# Patient Record
Sex: Female | Born: 1935 | Race: White | Hispanic: No | Marital: Married | Smoking: Never smoker
Health system: Southern US, Community
[De-identification: ages and names within clinical notes are randomized; demographics above are authoritative.]

## PROBLEM LIST (undated history)

## (undated) DIAGNOSIS — I10 Essential (primary) hypertension: Secondary | ICD-10-CM

## (undated) DIAGNOSIS — D759 Disease of blood and blood-forming organs, unspecified: Secondary | ICD-10-CM

## (undated) DIAGNOSIS — E079 Disorder of thyroid, unspecified: Secondary | ICD-10-CM

## (undated) DIAGNOSIS — R519 Headache, unspecified: Secondary | ICD-10-CM

## (undated) DIAGNOSIS — N289 Disorder of kidney and ureter, unspecified: Secondary | ICD-10-CM

## (undated) DIAGNOSIS — N83209 Unspecified ovarian cyst, unspecified side: Secondary | ICD-10-CM

## (undated) DIAGNOSIS — R51 Headache: Secondary | ICD-10-CM

## (undated) DIAGNOSIS — D72819 Decreased white blood cell count, unspecified: Secondary | ICD-10-CM

## (undated) DIAGNOSIS — I509 Heart failure, unspecified: Secondary | ICD-10-CM

## (undated) DIAGNOSIS — IMO0001 Reserved for inherently not codable concepts without codable children: Secondary | ICD-10-CM

## (undated) DIAGNOSIS — N3281 Overactive bladder: Secondary | ICD-10-CM

## (undated) DIAGNOSIS — E785 Hyperlipidemia, unspecified: Secondary | ICD-10-CM

## (undated) DIAGNOSIS — R569 Unspecified convulsions: Secondary | ICD-10-CM

## (undated) DIAGNOSIS — K219 Gastro-esophageal reflux disease without esophagitis: Secondary | ICD-10-CM

## (undated) DIAGNOSIS — D649 Anemia, unspecified: Secondary | ICD-10-CM

## (undated) DIAGNOSIS — C801 Malignant (primary) neoplasm, unspecified: Secondary | ICD-10-CM

## (undated) DIAGNOSIS — M1990 Unspecified osteoarthritis, unspecified site: Secondary | ICD-10-CM

## (undated) DIAGNOSIS — I219 Acute myocardial infarction, unspecified: Secondary | ICD-10-CM

## (undated) HISTORY — PX: CORONARY ANGIOPLASTY WITH STENT PLACEMENT: SHX49

## (undated) HISTORY — DX: Unspecified ovarian cyst, unspecified side: N83.209

## (undated) HISTORY — PX: TONSILLECTOMY: SUR1361

## (undated) HISTORY — PX: ABDOMINAL HYSTERECTOMY: SHX81

## (undated) HISTORY — PX: JOINT REPLACEMENT: SHX530

---

## 2004-09-23 ENCOUNTER — Ambulatory Visit: Payer: Self-pay | Admitting: Internal Medicine

## 2005-10-03 ENCOUNTER — Ambulatory Visit: Payer: Self-pay | Admitting: Internal Medicine

## 2006-10-08 ENCOUNTER — Ambulatory Visit: Payer: Self-pay | Admitting: Internal Medicine

## 2007-10-29 ENCOUNTER — Ambulatory Visit: Payer: Self-pay | Admitting: Internal Medicine

## 2008-11-03 ENCOUNTER — Ambulatory Visit: Payer: Self-pay | Admitting: Internal Medicine

## 2009-05-08 HISTORY — PX: EYE SURGERY: SHX253

## 2009-06-09 ENCOUNTER — Ambulatory Visit: Payer: Self-pay | Admitting: Ophthalmology

## 2009-07-28 ENCOUNTER — Ambulatory Visit: Payer: Self-pay | Admitting: Ophthalmology

## 2009-11-04 ENCOUNTER — Ambulatory Visit: Payer: Self-pay | Admitting: Internal Medicine

## 2010-11-08 ENCOUNTER — Ambulatory Visit: Payer: Self-pay | Admitting: Internal Medicine

## 2011-07-17 ENCOUNTER — Encounter: Payer: Self-pay | Admitting: Orthopedic Surgery

## 2011-08-07 ENCOUNTER — Encounter: Payer: Self-pay | Admitting: Orthopedic Surgery

## 2011-11-22 ENCOUNTER — Ambulatory Visit: Payer: Self-pay | Admitting: Internal Medicine

## 2012-11-22 ENCOUNTER — Ambulatory Visit: Payer: Self-pay | Admitting: Internal Medicine

## 2013-10-31 DIAGNOSIS — M169 Osteoarthritis of hip, unspecified: Secondary | ICD-10-CM | POA: Insufficient documentation

## 2014-02-27 ENCOUNTER — Ambulatory Visit: Payer: Self-pay | Admitting: Nurse Practitioner

## 2014-03-16 ENCOUNTER — Ambulatory Visit: Payer: Self-pay | Admitting: Orthopedic Surgery

## 2014-03-16 LAB — URINALYSIS, COMPLETE
Bacteria: NONE SEEN
Bilirubin,UR: NEGATIVE
Blood: NEGATIVE
GLUCOSE, UR: NEGATIVE mg/dL (ref 0–75)
Hyaline Cast: 5
KETONE: NEGATIVE
Nitrite: NEGATIVE
PH: 5 (ref 4.5–8.0)
Protein: NEGATIVE
RBC,UR: 2 /HPF (ref 0–5)
Specific Gravity: 1.018 (ref 1.003–1.030)
Squamous Epithelial: 11
WBC UR: 5 /HPF (ref 0–5)

## 2014-03-16 LAB — CBC
HCT: 41.3 % (ref 35.0–47.0)
HGB: 13.7 g/dL (ref 12.0–16.0)
MCH: 30.7 pg (ref 26.0–34.0)
MCHC: 33.2 g/dL (ref 32.0–36.0)
MCV: 92 fL (ref 80–100)
Platelet: 371 10*3/uL (ref 150–440)
RBC: 4.47 10*6/uL (ref 3.80–5.20)
RDW: 12.2 % (ref 11.5–14.5)
WBC: 4.4 10*3/uL (ref 3.6–11.0)

## 2014-03-16 LAB — BASIC METABOLIC PANEL
Anion Gap: 6 — ABNORMAL LOW (ref 7–16)
BUN: 21 mg/dL — ABNORMAL HIGH (ref 7–18)
CREATININE: 1.17 mg/dL (ref 0.60–1.30)
Calcium, Total: 9.6 mg/dL (ref 8.5–10.1)
Chloride: 96 mmol/L — ABNORMAL LOW (ref 98–107)
Co2: 32 mmol/L (ref 21–32)
GFR CALC AF AMER: 57 — AB
GFR CALC NON AF AMER: 47 — AB
Glucose: 95 mg/dL (ref 65–99)
Osmolality: 271 (ref 275–301)
POTASSIUM: 3.3 mmol/L — AB (ref 3.5–5.1)
Sodium: 134 mmol/L — ABNORMAL LOW (ref 136–145)

## 2014-03-16 LAB — PROTIME-INR
INR: 0.9
Prothrombin Time: 12.2 secs (ref 11.5–14.7)

## 2014-03-16 LAB — SEDIMENTATION RATE: Erythrocyte Sed Rate: 1 mm/hr (ref 0–30)

## 2014-03-16 LAB — APTT: ACTIVATED PTT: 29.2 s (ref 23.6–35.9)

## 2014-03-16 LAB — MRSA PCR SCREENING

## 2014-03-24 ENCOUNTER — Inpatient Hospital Stay: Payer: Self-pay | Admitting: Orthopedic Surgery

## 2014-03-24 LAB — TROPONIN I
Troponin-I: 0.03 ng/mL
Troponin-I: 0.06 ng/mL — ABNORMAL HIGH

## 2014-03-25 LAB — BASIC METABOLIC PANEL
Anion Gap: 6 — ABNORMAL LOW (ref 7–16)
BUN: 20 mg/dL — ABNORMAL HIGH (ref 7–18)
CHLORIDE: 102 mmol/L (ref 98–107)
Calcium, Total: 7.8 mg/dL — ABNORMAL LOW (ref 8.5–10.1)
Co2: 28 mmol/L (ref 21–32)
Creatinine: 1.19 mg/dL (ref 0.60–1.30)
EGFR (Non-African Amer.): 46 — ABNORMAL LOW
GFR CALC AF AMER: 56 — AB
GLUCOSE: 120 mg/dL — AB (ref 65–99)
Osmolality: 276 (ref 275–301)
Potassium: 3.2 mmol/L — ABNORMAL LOW (ref 3.5–5.1)
SODIUM: 136 mmol/L (ref 136–145)

## 2014-03-25 LAB — PLATELET COUNT: Platelet: 233 10*3/uL (ref 150–440)

## 2014-03-25 LAB — HEMOGLOBIN: HGB: 9.2 g/dL — AB (ref 12.0–16.0)

## 2014-05-08 HISTORY — PX: CORONARY STENT PLACEMENT: SHX1402

## 2014-05-26 ENCOUNTER — Ambulatory Visit: Payer: Self-pay | Admitting: Internal Medicine

## 2014-07-13 ENCOUNTER — Ambulatory Visit: Payer: Self-pay | Admitting: Orthopedic Surgery

## 2014-07-23 ENCOUNTER — Inpatient Hospital Stay: Payer: Self-pay | Admitting: Orthopedic Surgery

## 2014-08-29 NOTE — Op Note (Signed)
PATIENT NAME:  Monique Burns, Monique Burns MR#:  [id number] DATE OF BIRTH:  10-10-1930  DATE OF PROCEDURE:  03/24/2014  PREOPERATIVE DIAGNOSIS: Severe osteoarthritis and avascular necrosis, right hip.   POSTOPERATIVE DIAGNOSIS: Severe osteoarthritis and avascular necrosis, right hip.   PROCEDURE: Right total hip replacement, anterior approach.   SURGEON: Hessie Knows, M.D.   ANESTHESIA: Spinal.   DESCRIPTION OF PROCEDURE: The patient was brought to the operating room and after adequate anesthesia was obtained the patient was placed on the operative table with the left leg on a well-padded table, right foot in the Medacta attachment. Traction views were taken at the start of the case to get a determination of appropriate length and then the traction was released. The hip was prepped and draped in the usual sterile fashion with appropriate patient identification and timeout procedures utilized. Amis approach was used with incision centered over the greater trochanter and the TFL. The fascia was incised. The muscle retracted laterally. The lateral femoral circumflex vessels were ligated and the anterior capsule exposed. A capsulotomy was created and deep retractor placed. Femoral neck cut was made at the appropriate level and the head was quite deformed. Checking the acetabulum, there was significant deformity there as well with synovitis present. This was debrided along with the labrum. Reaming was carried out very carefully with the extensive previous bone loss to 52 mm at which point there was bleeding bone superiorly, anterior and posterior, with good anterior and posterior wall still present. Medial the wall was quite thin and no further reaming was carried out medially. A 54 mm Mpact cup was impacted into place and felt very stable so no screws were utilized. The 36 mm internal diameter liner was impacted into place and was locked into the metal cup. Attention was then turned to the femur. With external  rotation, pubofemoral release had already been carried out with the initial capsulotomy. Partial ischiofemoral release was carried out to mobilize the femur. The leg was brought down into extension and a box osteotome used followed by sequential broaching up to a size 4. The size 4 stem was then inserted after trialing with the standard neck. A 4 standard stem was impacted with a S-36 mm head reduced into the acetabulum and near anatomic alignment was felt to have been obtained. The wound was thoroughly irrigated and 30 mL 0.25% Sensorcaine with epinephrine was infiltrated in the periarticular tissues to aid in postop analgesia. The wound was closed with a heavy quill suture for the deep fascia, 2-0 quill subcutaneously, skin staples, Xeroform, 4 x 4's, ABD and tape applied.   IMPLANTS: Medacta Amis 4 stem, 36 mm S head, a 54 mm Mpact cup with E liner, 36 mm internal diameter.   ESTIMATED BLOOD LOSS: 400 mL.   COMPLICATIONS: None.   SPECIMEN REMOVED: Femoral head. ____________________________ Laurene Footman, MD mjm:sb D: 03/24/2014 13:08:46 ET T: 03/24/2014 14:16:38 ET JOB#: [id number]  cc: Laurene Footman, MD, <Dictator> Laurene Footman MD ELECTRONICALLY SIGNED 03/24/2014 19:04

## 2014-08-29 NOTE — Discharge Summary (Signed)
PATIENT NAME:  Monique Burns, Monique Burns MR#:  [id number] DATE OF BIRTH:  June 11, 1930  DATE OF ADMISSION:  03/24/2014 DATE OF DISCHARGE:  03/28/2014  ADMITTING DIAGNOSIS: Severe osteoarthritis of the right hip.   DISCHARGE DIAGNOSIS: Severe osteoarthritis of the right hip.   OPERATION: On 03/24/2014, the patient had a right total hip replacement using anterior approach.   SURGEON: Laurene Footman, MD  IMPLANTS USED: Medacta AMIS 4 stem, 36 mm S head, a 54 mm MPACT cup with E liner, 36 mm internal diameter.   ESTIMATED BLOOD LOSS: 400 mL.   COMPLICATIONS: None.  The patient was stabilized, brought to the recovery room and then brought down to the orthopedic floor.   HISTORY AND PHYSICAL: The patient is an 81 year old female who presented for evaluation of her right hip. The patient has been refractory to conservative treatment and has been having difficulties with activities around her home. The patient is having pain involving her hip joint.   PHYSICAL EXAMINATION: GENERAL: Well-nourished female with antalgic gait and a slight hip lurch to the right.  LUNGS: Clear to auscultation.  HEART: Regular rate and rhythm.  MUSCULOSKELETAL: In regard to the right hip, the patient does have mild shortening with limited internal and external rotation as well as abduction and flexion. The patient has pain along the anterior hip joint. The patient has no real crepitus with motion. The patient has good quadriceps control.   HOSPITAL COURSE: After initial admission on 03/24/2014, the patient was brought to the orthopedic floor. The patient on postoperative day 1 had a hemoglobin of 9.2, which remained stable there. The patient worked with physical therapy, initially bed-to-chair and progressed up to ambulating 220 feet around the nurse's station, doing well. The patient was ready to go home with home health physical therapy.   CONDITION AT DISCHARGE: Stable.   DISPOSITION: The patient was sent home with home  health physical therapy.   DISCHARGE INSTRUCTIONS: The patient will do weight bear as tolerated on the affected leg and use a walker for ambulation. The patient will use anterior hip precautions. The patient will elevate her heels off the bed and use the incentive spirometer. The patient was encouraged to do cough and deep breathing. The patient has a regular diet. The patient will have a dressing change as needed with physical therapy. The patient will try to keep her dressing clean and dry and try not to take it off. The patient will call the clinic if there is any bright red bleeding, calf pain, bowel or bladder difficulty or any fever greater than 101.5. The patient will do physical therapy at home working on range of motion and gait training and strengthening.   DISCHARGE MEDICATIONS: Resume home medications and to add Tylenol 500 mg 1 tablet every 4 hours as needed for fever, oxycodone 5 mg 1 tablet q.4 hours as needed for moderate to severe pain, milk of magnesia 30 mL twice a day for constipation, Lovenox 40 mg 1 injection a day for 14 days and discontinue and then start an aspirin 81 mg once a day.   ____________________________ Lenna Sciara. Reche Dixon, [place] jtm:sw D: 03/28/2014 06:49:00 ET T: 03/28/2014 07:03:43 ET JOB#: [id number]  cc: J. Reche Dixon, [place], <Dictator> J Teckla Christiansen [facility] PA ELECTRONICALLY SIGNED 03/29/2014 6:17

## 2014-08-29 NOTE — Consult Note (Signed)
   Present Illness Called by PACU to see patient who is s/p orthopedic surgery who was felt to have st depression on telemetry strips during surgery. POst op,she states she had some chest heaviness and ekg revealed nonspecific st t wave changes. She deneis any prior cardiac issues and is on hctz for blood pressure control. She is currently relatively hypotensive after receiving sl ntg.   Physical Exam:  GEN well nourished, no acute distress   HEENT PERRL   NECK supple   RESP normal resp effort  clear BS   CARD Regular rate and rhythm  Normal, S1, S2  No murmur   ABD denies tenderness  no hernia  normal BS   LYMPH negative neck, negative axillae   EXTR negative cyanosis/clubbing, negative edema   SKIN normal to palpation   NEURO cranial nerves intact, motor/sensory function intact   PSYCH A+O to time, place, person   Review of Systems:  Subjective/Chief Complaint complains of mild chest heaviness.   Skin: No Complaints   ENT: No Complaints   Eyes: No Complaints   Neck: No Complaints   Respiratory: No Complaints   Cardiovascular: Tightness   Gastrointestinal: No Complaints   Genitourinary: No Complaints   Vascular: No Complaints   Musculoskeletal: No Complaints   Neurologic: No Complaints   Hematologic: No Complaints   Endocrine: No Complaints   Psychiatric: No Complaints   Review of Systems: All other systems were reviewed and found to be negative   Medications/Allergies Reviewed Medications/Allergies reviewed   Family & Social History:  Family and Social History:  Family History Coronary Artery Disease   EKG:  Interpretation nsr with nsttw changes    No Known Allergies:    Impression Pt with history of hypertension who had right hip replacment today and was noted to have st depression on telemetry strips during surgery. Post op surgery ekg revealed nsr witih ns st t wave changes. Pt states she has some chest heaviness. No prior cardiac  problems. Will follow on off unint telemetry and repeat ekg in am. Will rule out for mi with serial troponin and treat with asa. Hold beta blockers for now due to relative hypotension post ntg.   Plan 1. Rule out for mi 2. Ecotrin 81 mg daily 3. Off unit telemetry 4. Serial troponin 5. Post op care per orthopaedics 6. Further recs pending course.   Electronic Signatures: Teodoro Spray (MD)  (Signed 17-Nov-15 14:19)  Authored: General Aspect/Present Illness, History and Physical Exam, Review of System, Family & Social History, EKG , Allergies, Impression/Plan   Last Updated: 17-Nov-15 14:19 by Teodoro Spray (MD)

## 2014-08-31 LAB — SURGICAL PATHOLOGY

## 2014-09-06 NOTE — Op Note (Signed)
PATIENT NAME:  Monique Burns, Monique Burns MR#:  [id number] DATE OF BIRTH:  1930/05/24  DATE OF PROCEDURE:  07/23/2014  PREOPERATIVE DIAGNOSIS: Left hip, severe osteoarthritis.   POSTOPERATIVE DIAGNOSIS: Left hip, severe osteoarthritis.   PROCEDURE: Left total hip replacement.   ANESTHESIA: Spinal.   SURGEON: Laurene Footman, MD   DESCRIPTION OF PROCEDURE: The patient was brought to the operating room and after adequate anesthesia was obtained, the left hip was prepped and draped in the usual sterile fashion after right leg was placed on a well-padded table, left foot in the Medacta attachment, and preoperative x-ray template was obtained. After patient identification and timeout procedures were completed, an incision was made centered over the greater trochanter and tensor fascia lata muscle. The tensor fascia was exposed and the muscle belly retracted laterally. The deep fascia was incised, and the lateral femoral circumflex vessels ligated. The anterior capsule was then exposed and a capsulotomy created, followed by femoral neck cut with eburnated bone on the femur and acetabulum. Reaming was carried out to 52 mm, at which point, there was good bleeding bone. A 52 mm Versafitcup DM was stable and impacted into position. Next, the leg was externally rotated with pubofemoral and ischiofemoral releases carried out. The leg was dropped into extension. Broaching was carried out to a size 4 stem with an S head trial and standard neck length. Restoration of anatomy was obtained and these final components were assembled with a size 4 stem impacted down the canal. The S-28 mm head and Versafitcup liner DM assembled on the back table, impacted out of the cup. The hip was reduced and was stable. The wound was thoroughly irrigated with Betadine lavage. The deep fascia was repaired using a heavy quill suture, 2-0 quill subcutaneously, followed by skin staples. Xeroform, 4 x 4's, ABD, and tape applied.   ESTIMATED BLOOD  LOSS: 100 mL.   COMPLICATIONS: None.   SPECIMEN: Removed femoral head.   IMPLANTS: Versafitcup DM 52 mm with liner S-28 mm head and Amis 4 standard stem.     ____________________________ Laurene Footman, MD mjm:mw D: 07/23/2014 20:21:15 ET T: 07/23/2014 20:32:05 ET JOB#: [id number]  cc: Laurene Footman, MD, <Dictator> Laurene Footman MD ELECTRONICALLY SIGNED 07/24/2014 8:54

## 2014-09-06 NOTE — Discharge Summary (Signed)
PATIENT NAME:  Monique Burns, Monique Burns MR#:  [id number] DATE OF BIRTH:  [birth year]  DATE OF ADMISSION:  07/23/2014 DATE OF DISCHARGE:  07/26/2014  ADMITTING DIAGNOSIS: Left hip severe osteoarthritis.   DISCHARGE DIAGNOSIS: Left hip severe osteoarthritis.  OPERATION: On 07/23/2014, the patient had a left total hip replacement.   ANESTHESIA: Spinal.   SURGEON: Laurene Footman, MD..   ESTIMATED BLOOD LOSS: 100 mL.  IMPLANTS USED: Versafit DM 52-mm with liner S, 28 mm head, and AMIS 4 standard stem. The patient was stabilized, brought to the recovery room, and then brought down to the orthopedic floor for pain control and physical therapy.   HISTORY AND PHYSICAL:  HISTORY: The patient is an 81 year old female who presented for upcoming hip replacement. The patient has had continued pain and been refractory to conservative treatment. She is status post right total hip replacement in November 2015 and did very well with this. The patient has continued to have difficulty getting around and pain and has failed conservative management.   PHYSICAL EXAMINATION:  GENERAL: Alert female with difficulty ambulating secondary to the left hip. The patient has trouble with transfers.  HEART: Regular rate and rhythm.  LUNGS: Clear to auscultation.  MUSCULOSKELETAL: In regard to the left hip, the patient has no real tenderness to palpation but does have very limited and restricted range of motion secondary to pain.  NEUROLOGIC: Essentially intact.   HOSPITAL COURSE: After initial admission on 07/23/2014, the patient had a hemoglobin on postoperative day 1 of 10.8, which remained stable there to 10 on postoperative day 2. The patient received no transfusion. The patient worked with physical therapy, initially bed to chair and progressing up to ambulating 150 feet and around the nurse's station with a slow gait but no significant discomfort. The patient was ready go home with home health physical therapy on  07/26/2014.   CONDITION AT DISCHARGE: Stable.   DISPOSITION: The patient was sent home with home health physical therapy.   DISCHARGE INSTRUCTIONS: The patient will do weight bear as tolerated on the affected leg. The patient will elevate her leg with 1 to 2 pillows, use knee-high TED hose on both legs, removed at bedtime. She will elevate her heels off the bed and use incentive spirometer while awake as well as be encouraged to do cough and deep breathing. The patient will do a regular diet. The patient will try to keep her dressing clean, try not to get it wet, and have it changed as needed with physical therapy. The patient will call the clinic if there is bright red bleeding, calf pain, bowel or bladder difficulty, or any fever greater than 101.5. The patient will do home health physical therapy, working on gait training and strengthening.   DISCHARGE MEDICATIONS: Resume home medication and to add Tylenol 500 mg 1 tablet every 4 hours as needed for pain or fever, oxycodone 5 mg 1 tablet every 4 hours as needed for severe pain, Lovenox 40 mg subcutaneous once a day for 14 days and then discontinue and begin 81 mg aspirin once a day and Senokot-S 1 tablet twice a day for constipation.    ____________________________ Lenna Sciara. Reche Dixon, [place] jtm:jh D: 07/26/2014 06:58:28 ET T: 07/26/2014 09:00:07 ET JOB#: [id number]  cc: J. Reche Dixon, [place], <Dictator> J Tiney Zipper [facility] PA ELECTRONICALLY SIGNED 07/27/2014 8:41

## 2015-02-03 ENCOUNTER — Other Ambulatory Visit: Payer: Self-pay | Admitting: Internal Medicine

## 2015-02-03 DIAGNOSIS — Z1231 Encounter for screening mammogram for malignant neoplasm of breast: Secondary | ICD-10-CM

## 2015-02-06 DIAGNOSIS — I219 Acute myocardial infarction, unspecified: Secondary | ICD-10-CM

## 2015-02-06 HISTORY — DX: Acute myocardial infarction, unspecified: I21.9

## 2015-02-28 ENCOUNTER — Emergency Department
Admission: EM | Admit: 2015-02-28 | Discharge: 2015-02-28 | Payer: Medicare Other | Attending: Emergency Medicine | Admitting: Emergency Medicine

## 2015-02-28 ENCOUNTER — Emergency Department: Payer: Medicare Other

## 2015-02-28 ENCOUNTER — Encounter: Payer: Self-pay | Admitting: Adult Health

## 2015-02-28 DIAGNOSIS — I213 ST elevation (STEMI) myocardial infarction of unspecified site: Secondary | ICD-10-CM

## 2015-02-28 DIAGNOSIS — I249 Acute ischemic heart disease, unspecified: Secondary | ICD-10-CM | POA: Diagnosis not present

## 2015-02-28 DIAGNOSIS — I10 Essential (primary) hypertension: Secondary | ICD-10-CM | POA: Diagnosis not present

## 2015-02-28 DIAGNOSIS — I2109 ST elevation (STEMI) myocardial infarction involving other coronary artery of anterior wall: Secondary | ICD-10-CM | POA: Insufficient documentation

## 2015-02-28 DIAGNOSIS — R079 Chest pain, unspecified: Secondary | ICD-10-CM | POA: Diagnosis present

## 2015-02-28 DIAGNOSIS — R7989 Other specified abnormal findings of blood chemistry: Secondary | ICD-10-CM | POA: Insufficient documentation

## 2015-02-28 DIAGNOSIS — R778 Other specified abnormalities of plasma proteins: Secondary | ICD-10-CM

## 2015-02-28 HISTORY — DX: Unspecified osteoarthritis, unspecified site: M19.90

## 2015-02-28 HISTORY — DX: Decreased white blood cell count, unspecified: D72.819

## 2015-02-28 HISTORY — DX: Disorder of thyroid, unspecified: E07.9

## 2015-02-28 HISTORY — DX: Hyperlipidemia, unspecified: E78.5

## 2015-02-28 HISTORY — DX: Disorder of kidney and ureter, unspecified: N28.9

## 2015-02-28 HISTORY — DX: Essential (primary) hypertension: I10

## 2015-02-28 LAB — BASIC METABOLIC PANEL
ANION GAP: 10 (ref 5–15)
BUN: 25 mg/dL — AB (ref 6–20)
CO2: 26 mmol/L (ref 22–32)
Calcium: 9.9 mg/dL (ref 8.9–10.3)
Chloride: 101 mmol/L (ref 101–111)
Creatinine, Ser: 1.11 mg/dL — ABNORMAL HIGH (ref 0.44–1.00)
GFR, EST AFRICAN AMERICAN: 52 mL/min — AB (ref >60)
GFR, EST NON AFRICAN AMERICAN: 45 mL/min — AB (ref >60)
Glucose, Bld: 130 mg/dL — ABNORMAL HIGH (ref 65–99)
POTASSIUM: 3.8 mmol/L (ref 3.5–5.1)
SODIUM: 137 mmol/L (ref 135–145)

## 2015-02-28 LAB — CBC
HEMATOCRIT: 40 % (ref 35.0–47.0)
Hemoglobin: 13.2 g/dL (ref 12.0–16.0)
MCH: 29.7 pg (ref 26.0–34.0)
MCHC: 33 g/dL (ref 32.0–36.0)
MCV: 89.9 fL (ref 80.0–100.0)
Platelets: 282 10*3/uL (ref 150–440)
RBC: 4.45 MIL/uL (ref 3.80–5.20)
RDW: 13.9 % (ref 11.5–14.5)
WBC: 6.4 10*3/uL (ref 3.6–11.0)

## 2015-02-28 LAB — TROPONIN I: Troponin I: 2.89 ng/mL — ABNORMAL HIGH (ref <0.031)

## 2015-02-28 LAB — PROTIME-INR
INR: 0.97
PROTHROMBIN TIME: 13.1 s (ref 11.4–15.0)

## 2015-02-28 MED ORDER — ONDANSETRON HCL 4 MG/2ML IJ SOLN
4.0000 mg | Freq: Once | INTRAMUSCULAR | Status: AC
  Administered 2015-02-28: 4 mg via INTRAVENOUS

## 2015-02-28 MED ORDER — MORPHINE SULFATE (PF) 2 MG/ML IV SOLN
INTRAVENOUS | Status: AC
  Administered 2015-02-28: 2 mg via INTRAVENOUS
  Filled 2015-02-28: qty 1

## 2015-02-28 MED ORDER — HEPARIN (PORCINE) IN NACL 100-0.45 UNIT/ML-% IJ SOLN
700.0000 [IU]/h | INTRAMUSCULAR | Status: DC
  Filled 2015-02-28: qty 250

## 2015-02-28 MED ORDER — ONDANSETRON HCL 4 MG/2ML IJ SOLN
INTRAMUSCULAR | Status: AC
  Administered 2015-02-28: 4 mg via INTRAVENOUS
  Filled 2015-02-28: qty 2

## 2015-02-28 MED ORDER — MORPHINE SULFATE (PF) 2 MG/ML IV SOLN
2.0000 mg | Freq: Once | INTRAVENOUS | Status: AC
  Administered 2015-02-28: 2 mg via INTRAVENOUS

## 2015-02-28 MED ORDER — TICAGRELOR 90 MG PO TABS
180.0000 mg | ORAL_TABLET | Freq: Once | ORAL | Status: AC
  Administered 2015-02-28: 180 mg via ORAL
  Filled 2015-02-28: qty 2

## 2015-02-28 MED ORDER — HEPARIN SODIUM (PORCINE) 5000 UNIT/ML IJ SOLN
INTRAMUSCULAR | Status: AC
  Filled 2015-02-28: qty 1

## 2015-02-28 MED ORDER — NITROGLYCERIN 2 % TD OINT
1.0000 [in_us] | TOPICAL_OINTMENT | Freq: Once | TRANSDERMAL | Status: AC
  Administered 2015-02-28: 1 [in_us] via TOPICAL
  Filled 2015-02-28: qty 1

## 2015-02-28 MED ORDER — HEPARIN BOLUS VIA INFUSION
3400.0000 [IU] | Freq: Once | INTRAVENOUS | Status: DC
  Filled 2015-02-28: qty 3400

## 2015-02-28 MED ORDER — ASPIRIN EC 325 MG PO TBEC
325.0000 mg | DELAYED_RELEASE_TABLET | Freq: Once | ORAL | Status: AC
  Administered 2015-02-28: 325 mg via ORAL

## 2015-02-28 MED ORDER — HEPARIN SODIUM (PORCINE) 5000 UNIT/ML IJ SOLN: 60.0000 [IU]/kg | Freq: Once | INTRAMUSCULAR | Status: DC

## 2015-02-28 MED ORDER — ASPIRIN 81 MG PO CHEW: 324.0000 mg | CHEWABLE_TABLET | Freq: Once | ORAL | Status: DC

## 2015-02-28 MED ORDER — ASPIRIN EC 325 MG PO TBEC
DELAYED_RELEASE_TABLET | ORAL | Status: AC
  Administered 2015-02-28: 325 mg via ORAL
  Filled 2015-02-28: qty 1

## 2015-02-28 MED ORDER — HEPARIN SODIUM (PORCINE) 5000 UNIT/ML IJ SOLN
60.0000 [IU]/kg | Freq: Once | INTRAMUSCULAR | Status: AC
  Administered 2015-02-28: 3400 [IU] via INTRAVENOUS

## 2015-02-28 NOTE — ED Notes (Signed)
Report given to [facility] [place].

## 2015-02-28 NOTE — ED Notes (Signed)
MD notified of elevated troponin of 2.89

## 2015-02-28 NOTE — Progress Notes (Signed)
ANTICOAGULATION CONSULT NOTE - Initial Consult  Pharmacy Consult for heparin drip Indication: ACS/STEMI  No Known Allergies  Patient Measurements: Weight: 125 lb (56.7 kg) Heparin Dosing Weight: 57kg  Vital Signs: Temp: 97.8 F (36.6 C) (10/23 0540) Temp Source: Oral (10/23 0353) BP: 166/89 mmHg (10/23 0540) Pulse Rate: 81 (10/23 0540)  Labs:  Recent Labs  02/28/15 0409 02/28/15 0410  HGB 13.2  --   HCT 40.0  --   PLT 282  --   LABPROT  --  13.1  INR  --  0.97  CREATININE 1.11*  --   TROPONINI 2.89*  --     CrCl cannot be calculated (Unknown ideal weight.).   Medical History: Past Medical History  Diagnosis Date  . Hypertension   . Renal disorder   . Arthritis   . Thyroid disease   . Hyperlipidemia   . Leukopenia     Medications:    Assessment: Hgb 13.2  plt 282 IR 0.97  Goal of Therapy:  Heparin level 0.3-0.7 units/ml Monitor platelets by anticoagulation protocol: Yes   Plan:  3400 unit bolus and initial rate of 700 units/hr ordered. Pt to be transferred out. No f/u labs ordered.  Annalea Alguire S 02/28/2015,6:49 AM

## 2015-02-28 NOTE — ED Notes (Signed)
Presents with left sided crushing chest pain that radiaites into her left arm and is associated with indigestion, nausea, SOB and general not feeling well. Endorses emeisis yesterday morning. Pain is constant all day today but began in September and was not as severe. Pain is made worse with activity and nothing makes painbetter.

## 2015-02-28 NOTE — ED Provider Notes (Signed)
[facility] Emergency Department Provider Note  ____________________________________________  Time seen: Approximately 4:17 AM  I have reviewed the triage vital signs and the nursing notes.   HISTORY  Chief Complaint Chest Pain    HPI Monique Burns is a 81 y.o. female who presents to the ED from home with the chief complaint of chest pain. Patient has had a six-month history of angina; had normal Myoview and echocardiogram in March by Dr. Clayborn Bigness.Most recently saw him in the office October 3 for continued anginal symptoms; currently on medication therapy. States this morning she is having more pain that radiates into her left arm. Symptoms are associated with nausea, shortness of breath and indigestion. States she vomited 2 prior to arrival. More recently patient states her angina has been more frequent since the beginning of September. Pain was constant all day, just not as intense. Pain became more severe approximately midnight. Pain is exacerbated with activity; nothing makes her pain better. Denies recent fever, chills, abdominal pain, headache, neck pain. Denies recent travel or trauma.   Past Medical History  Diagnosis Date  . Hypertension   . Renal disorder   . Arthritis   . Thyroid disease   . Hyperlipidemia   . Leukopenia     There are no active problems to display for this patient.   History reviewed. No pertinent past surgical history.  No current outpatient prescriptions on file.  Allergies Review of patient's allergies indicates no known allergies.  History reviewed. No pertinent family history.  Social History Social History  Substance Use Topics  . Smoking status: Never Smoker   . Smokeless tobacco: None  . Alcohol Use: No    Review of Systems Constitutional: No fever/chills Eyes: No visual changes. ENT: No sore throat. Cardiovascular: Positive for chest pain. Respiratory: Denies shortness of breath. Gastrointestinal:  No abdominal pain.  No nausea, no vomiting.  No diarrhea.  No constipation. Genitourinary: Negative for dysuria. Musculoskeletal: Negative for back pain. Skin: Negative for rash. Neurological: Negative for headaches, focal weakness or numbness.  10-point ROS otherwise negative.  ____________________________________________   PHYSICAL EXAM:  VITAL SIGNS: ED Triage Vitals  Enc Vitals Group     BP 02/28/15 0353 156/64 mmHg     Pulse Rate 02/28/15 0353 61     Resp 02/28/15 0353 18     Temp 02/28/15 0353 98 F (36.7 C)     Temp Source 02/28/15 0353 Oral     SpO2 02/28/15 0353 98 %     Weight --      Height --      Head Cir --      Peak Flow --      Pain Score 02/28/15 0353 9     Pain Loc --      Pain Edu? --      Excl. in [place]? --     Constitutional: Alert and oriented. Well appearing and in no acute distress. Eyes: Conjunctivae are normal. PERRL. EOMI. Head: Atraumatic. Nose: No congestion/rhinnorhea. Mouth/Throat: Mucous membranes are moist.  Oropharynx non-erythematous. Neck: No stridor. No carotid bruits. Cardiovascular: Normal rate, regular rhythm. Grossly normal heart sounds.  Good peripheral circulation. Respiratory: Normal respiratory effort.  No retractions. Lungs CTAB. Gastrointestinal: Soft and nontender. No distention. No abdominal bruits. No CVA tenderness. Musculoskeletal: No lower extremity tenderness nor edema.  No joint effusions. Neurologic:  Normal speech and language. No gross focal neurologic deficits are appreciated. No gait instability. Skin:  Skin is warm, dry and intact. No  rash noted. Psychiatric: Mood and affect are normal. Speech and behavior are normal.  ____________________________________________   LABS (all labs ordered are listed, but only abnormal results are displayed)  Labs Reviewed  BASIC METABOLIC PANEL - Abnormal; Notable for the following:    Glucose, Bld 130 (*)    BUN 25 (*)    Creatinine, Ser 1.11 (*)    GFR calc non Af Amer  45 (*)    GFR calc Af Amer 52 (*)    All other components within normal limits  TROPONIN I - Abnormal; Notable for the following:    Troponin I 2.89 (*)    All other components within normal limits  CBC   ____________________________________________  EKG  ED ECG REPORT I, Marny Smethers J, the attending physician, personally viewed and interpreted this ECG.   Date: 02/28/2015  EKG Time: 0347  Rate: 58  Rhythm: sinus bradycardia  Axis: Normal  Intervals:right bundle branch block  ST&T Change: 2-48mm ST elevation in leads V2 and V3 without reciprocal changes.  ____________________________________________  RADIOLOGY  Portable chest x-ray (viewed by me, interpreted per Dr. Radene Knee): No acute cardiopulmonary process seen. ____________________________________________   PROCEDURES  Procedure(s) performed: None  Critical Care performed: Yes, see critical care note(s)   CRITICAL CARE Performed by: Paulette Blanch   Total critical care time: 30 minutes  Critical care time was exclusive of separately billable procedures and treating other patients.  Critical care was necessary to treat or prevent imminent or life-threatening deterioration.  Critical care was time spent personally by me on the following activities: development of treatment plan with patient and/or surrogate as well as nursing, discussions with consultants, evaluation of patient's response to treatment, examination of patient, obtaining history from patient or surrogate, ordering and performing treatments and interventions, ordering and review of laboratory studies, ordering and review of radiographic studies, pulse oximetry and re-evaluation of patient's condition.  ____________________________________________   INITIAL IMPRESSION / ASSESSMENT AND PLAN / ED COURSE  Pertinent labs & imaging results that were available during my care of the patient were reviewed by me and considered in my medical decision making (see  chart for details).  81 year old female who presents with worsening anginal pain this morning. EKG is concerning for ST elevation in anterior leads. Will administer aspirin; consult cardiology urgently.  ----------------------------------------- 4:20 AM on 02/28/2015 -----------------------------------------  Call placed to Dr. Nehemiah Massed (cardiology on-call).  ----------------------------------------- 4:30 AM on 02/28/2015 -----------------------------------------  Call placed to [facility] fellow; if patient needs urgent intervention this weekend our facility does not have cardiac cath capabilities over the weekend. In the midst of speaking with CCU fellow when he urgently had to step away for an emergent patient. Anticipate callback. Currently patient is a mode dynamically stable. Will administer low-dose morphine as opposed to nitroglycerin as I do not want to precipitously drop her blood pressure.   ----------------------------------------- 4:55 AM on 02/28/2015 -----------------------------------------  Patient accepted to [facility] in transfer. They recommend administering 180mg  Brilinta and heparin bolus prior to transfer. Updated patient and spouse who are both agreeable to plan of care.  ----------------------------------------- 5:40 AM on 02/28/2015 -----------------------------------------  Ntg paste applied prior to patient's transport. She left the ED hemodynamically stable.   ____________________________________________   FINAL CLINICAL IMPRESSION(S) / ED DIAGNOSES  Final diagnoses:  Acute coronary syndrome (HCC)  Elevated troponin  ST elevation myocardial infarction (STEMI), unspecified artery (HCC)      Paulette Blanch, MD 02/28/15 [phone]

## 2015-02-28 NOTE — ED Notes (Signed)
Pt. States chest pain that radiates into lt. Arm began yesterday morning around 8 am.  Pt. States pain has increased throughout day.

## 2015-03-22 ENCOUNTER — Ambulatory Visit: Payer: Medicare Other

## 2015-04-05 ENCOUNTER — Encounter: Payer: Medicare Other | Attending: Internal Medicine | Admitting: *Deleted

## 2015-04-05 DIAGNOSIS — E079 Disorder of thyroid, unspecified: Secondary | ICD-10-CM | POA: Insufficient documentation

## 2015-04-05 DIAGNOSIS — I2102 ST elevation (STEMI) myocardial infarction involving left anterior descending coronary artery: Secondary | ICD-10-CM

## 2015-04-05 DIAGNOSIS — I10 Essential (primary) hypertension: Secondary | ICD-10-CM | POA: Insufficient documentation

## 2015-04-05 DIAGNOSIS — E785 Hyperlipidemia, unspecified: Secondary | ICD-10-CM | POA: Insufficient documentation

## 2015-04-05 DIAGNOSIS — D72819 Decreased white blood cell count, unspecified: Secondary | ICD-10-CM | POA: Insufficient documentation

## 2015-04-05 DIAGNOSIS — N189 Chronic kidney disease, unspecified: Secondary | ICD-10-CM | POA: Insufficient documentation

## 2015-04-05 DIAGNOSIS — M1990 Unspecified osteoarthritis, unspecified site: Secondary | ICD-10-CM | POA: Insufficient documentation

## 2015-04-05 DIAGNOSIS — Z9861 Coronary angioplasty status: Secondary | ICD-10-CM

## 2015-04-05 NOTE — Progress Notes (Signed)
Incomplete Session Note  Patient Details  Name: Monique Burns MRN: [id number] Date of Birth: [birth year] Referring Provider:  Yolonda Kida, MD  [name] did not complete her rehab session.  She had an injection into her left knee done right before this visit. She was not able to to the 6 min walk today. Has been rescheduled for 12/16 at 1 PM.   [place]

## 2015-04-23 ENCOUNTER — Encounter: Payer: Medicare Other | Attending: Internal Medicine | Admitting: *Deleted

## 2015-04-23 VITALS — Ht 68.0 in | Wt 120.9 lb

## 2015-04-23 DIAGNOSIS — I2102 ST elevation (STEMI) myocardial infarction involving left anterior descending coronary artery: Secondary | ICD-10-CM

## 2015-04-23 DIAGNOSIS — Z9861 Coronary angioplasty status: Secondary | ICD-10-CM | POA: Insufficient documentation

## 2015-04-23 NOTE — Patient Instructions (Signed)
Patient Instructions  Patient Details  Name: Monique Burns MRN: [id number] Date of Birth: 02-Nov-1930 Referring Provider:  Adin Hector, MD  Below are the personal goals you chose as well as exercise and nutrition goals. Our goal is to help you keep on track towards obtaining and maintaining your goals. We will be discussing your progress on these goals with you throughout the program.  Initial Exercise Prescription:     Initial Exercise Prescription - 04/23/15 1300    Date of Initial Exercise Prescription   Date 04/23/15   Treadmill   MPH 1.2   Grade 0   Minutes 10   Bike   Level 0.2   Watts 15   Minutes 10   Recumbant Bike   Level 2   RPM 40   Watts 15   Minutes 15   NuStep   Level 2   Watts 25   Minutes 15   Arm Ergometer   Level 1   Watts 8   Minutes 10   Arm/Foot Ergometer   Level 4   Watts 12   Minutes 10   Cybex   Level 1   RPM 50   Minutes 10   Recumbant Elliptical   Level 1   RPM 40   Watts 10   Minutes 10   REL-XR   Level 2   Watts 30   Minutes 15   T5 Nustep   Level 1   Watts 12   Minutes 15   Biostep-RELP   Level 2   Watts 15   Minutes 15   Prescription Details   Frequency (times per week) 3   Duration Progress to 30 minutes of continuous aerobic without signs/symptoms of physical distress   Intensity   THRR REST +  30   Ratings of Perceived Exertion 11-15   Progression Continue progressive overload as per policy without signs/symptoms or physical distress.   Resistance Training   Training Prescription Yes   Weight 2   Reps 10-15      Exercise Goals: Frequency: Be able to perform aerobic exercise three times per week working toward 3-5 days per week.  Intensity: Work with a perceived exertion of 11 (fairly light) - 15 (hard) as tolerated. Follow your new exercise prescription and watch for changes in prescription as you progress with the program. Changes will be reviewed with you when they are made.  Duration: You should  be able to do 30 minutes of continuous aerobic exercise in addition to a 5 minute warm-up and a 5 minute cool-down routine.  Nutrition Goals: Your personal nutrition goals will be established when you do your nutrition analysis with the dietician.  The following are nutrition guidelines to follow: Cholesterol < 200mg /day Sodium < 1500mg /day Fiber: Women over 50 yrs - 21 grams per day  Personal Goals:     Personal Goals and Risk Factors at Admission - 04/05/15 1333    Personal Goals and Risk Factors on Admission   Increase Aerobic Exercise and Physical Activity Sedentary  Recently had both hip replacements.  Has pain in the left knee. Today received injection in the knee. Wants to return to her ability to clean her house  the  way she was able to prior to her hip surgeries and the heart attack..   Intervention Provide exercise education and an individualized exercise prescription that will provide continued progressive overload as per policy without signs/symptoms of physical distress.   Hypertension Yes   Goal Participant will  see blood pressure controlled within the values of 140/63mm/Hg or within value directed by their physician.   Intervention Provide nutrition & aerobic exercise along with prescribed medications to achieve BP 140/90 or less.   Lipids Yes   Goal Cholesterol controlled with medications as prescribed, with individualized exercise RX and with personalized nutrition plan. Value goals: LDL < 70mg , HDL > 40mg . Participant states understanding of desired cholesterol values and following prescriptions.   Intervention Provide nutrition & aerobic exercise along with prescribed medications to achieve LDL 70mg , HDL >40mg .   Stress Yes  Stress is present every day.  [name] states she can "deal with stress"  well.  Her husband has been diagnosed with dementia.    Goal To meet with psychosocial counselor for stress and relaxation information and guidance. To state understanding of  performing relaxation techniques and or identifying personal stressors.   Intervention Provide education on types of stress, identifiying stressors, and ways to cope with stress. Provide demonstration and active practice of relaxation techniques.      Tobacco Use Initial Evaluation: History  Smoking status  . Never Smoker   Smokeless tobacco  . Not on file    Copy of goals given to participant.

## 2015-04-23 NOTE — Progress Notes (Signed)
Cardiac Individual Treatment Plan  Patient Details  Name: Monique Burns MRN: [id number] Date of Birth: 1931/03/08 Referring Provider:  Adin Hector, MD  Initial Encounter Date: Date: 04/23/15  Visit Diagnosis: ST elevation (STEMI) myocardial infarction involving left anterior descending coronary artery ([place])  S/P PTCA (percutaneous transluminal coronary angioplasty)  Patient's Home Medications on Admission:  Current outpatient prescriptions:  .  aspirin 81 MG chewable tablet, Chew by mouth., Disp: , Rfl:  .  atorvastatin (LIPITOR) 80 MG tablet, Take by mouth., Disp: , Rfl:  .  B Complex-Biotin-FA (BALANCED B-100 PO), Take 1 tablet by mouth daily., Disp: , Rfl:  .  calcium-vitamin D (OSCAL WITH D) 500-200 MG-UNIT tablet, Take 1 tablet by mouth daily., Disp: , Rfl:  .  Cholecalciferol (VITAMIN D3) 1000 UNITS CAPS, Take 1 tablet by mouth daily., Disp: , Rfl:  .  cycloSPORINE (RESTASIS) 0.05 % ophthalmic emulsion, Apply 1 drop to eye., Disp: , Rfl:  .  DULoxetine (CYMBALTA) 30 MG capsule, Take 30 mg by mouth daily., Disp: , Rfl: 2 .  isosorbide mononitrate (IMDUR) 60 MG 24 hr tablet, Take 0.5 tablets by mouth. Taking half a tablet per md instructions, Disp: , Rfl:  .  metoprolol succinate (TOPROL-XL) 25 MG 24 hr tablet, Take 0.5 tablets by mouth., Disp: , Rfl:  .  nitroGLYCERIN (NITROSTAT) 0.4 MG SL tablet, Place under the tongue., Disp: , Rfl:  .  oxyCODONE-acetaminophen (PERCOCET/ROXICET) 5-325 MG tablet, Take 1 tablet by mouth every 6 (six) hours as needed., Disp: , Rfl: 0 .  polyethylene glycol powder (GLYCOLAX/MIRALAX) powder, Take by mouth., Disp: , Rfl:  .  ranitidine (ZANTAC) 150 MG capsule, Take 1 capsule by mouth 2 (two) times daily., Disp: , Rfl:   Past Medical History: Past Medical History  Diagnosis Date  . Hypertension   . Renal disorder   . Arthritis   . Thyroid disease   . Hyperlipidemia   . Leukopenia     Tobacco Use: History  Smoking status  . Never  Smoker   Smokeless tobacco  . Not on file    Labs: Recent Review Flowsheet Data    There is no flowsheet data to display.       Exercise Target Goals: Date: 04/23/15  Exercise Program Goal: Individual exercise prescription set with THRR, safety & activity barriers. Participant demonstrates ability to understand and report RPE using BORG scale, to self-measure pulse accurately, and to acknowledge the importance of the exercise prescription.  Exercise Prescription Goal: Starting with aerobic activity 30 plus minutes a day, 3 days per week for initial exercise prescription. Provide home exercise prescription and guidelines that participant acknowledges understanding prior to discharge.  Activity Barriers & Risk Stratification:     Activity Barriers & Risk Stratification - 04/05/15 1323    Activity Barriers & Risk Stratification   Activity Barriers Arthritis;Right Hip Replacement;Left Hip Replacement;Deconditioning;Joint Problems   Risk Stratification High      6 Minute Walk:     6 Minute Walk      04/23/15 1303       6 Minute Walk   Phase Initial     Distance 815 feet     Walk Time 6 minutes     Resting HR 77 bpm     Resting BP 126/64 mmHg     Max Ex. HR 102 bpm     Max Ex. BP 140/58 mmHg     RPE 11     Symptoms Yes (comment)  Comments slight dizziness and unsteadiness         Initial Exercise Prescription:     Initial Exercise Prescription - 04/23/15 1300    Date of Initial Exercise Prescription   Date 04/23/15   Treadmill   MPH 1.2   Grade 0   Minutes 10   Bike   Level 0.2   Watts 15   Minutes 10   Recumbant Bike   Level 2   RPM 40   Watts 15   Minutes 15   NuStep   Level 2   Watts 25   Minutes 15   Arm Ergometer   Level 1   Watts 8   Minutes 10   Arm/Foot Ergometer   Level 4   Watts 12   Minutes 10   Cybex   Level 1   RPM 50   Minutes 10   Recumbant Elliptical   Level 1   RPM 40   Watts 10   Minutes 10   REL-XR   Level 2    Watts 30   Minutes 15   T5 Nustep   Level 1   Watts 12   Minutes 15   Biostep-RELP   Level 2   Watts 15   Minutes 15   Prescription Details   Frequency (times per week) 3   Duration Progress to 30 minutes of continuous aerobic without signs/symptoms of physical distress   Intensity   THRR REST +  30   Ratings of Perceived Exertion 11-15   Progression Continue progressive overload as per policy without signs/symptoms or physical distress.   Resistance Training   Training Prescription Yes   Weight 2   Reps 10-15      Exercise Prescription Changes:   Discharge Exercise Prescription (Final Exercise Prescription Changes):   Nutrition:  Target Goals: Understanding of nutrition guidelines, daily intake of sodium 1500mg , cholesterol 200mg , calories 30% from fat and 7% or less from saturated fats, daily to have 5 or more servings of fruits and vegetables.  Biometrics:     Pre Biometrics - 04/23/15 1301    Pre Biometrics   Height 5\' 8"  (1.727 m)   Weight 120 lb 14.4 oz (54.84 kg)   Waist Circumference 27 inches   Hip Circumference 35.25 inches   Waist to Hip Ratio 0.77 %   BMI (Calculated) 18.4       Nutrition Therapy Plan and Nutrition Goals:   Nutrition Discharge: Rate Your Plate Scores:   Nutrition Goals Re-Evaluation:   Psychosocial: Target Goals: Acknowledge presence or absence of depression, maximize coping skills, provide positive support system. Participant is able to verbalize types and ability to use techniques and skills needed for reducing stress and depression.  Initial Review & Psychosocial Screening:     Initial Psych Review & Screening - 04/05/15 1341    Initial Review   Current issues with Current Stress Concerns   Source of Stress Concerns Chronic Illness;Unable to perform yard/household activities   Comments Shawni's husband has been diagnosed with dementia. She deals with this daily.    Family Dynamics   Good Support System? Yes    Comments Alexa has good family support.  Children , nieces and nephews   Barriers   Psychosocial barriers to participate in program There are no identifiable barriers or psychosocial needs.;The patient should benefit from training in stress management and relaxation.   Screening Interventions   Interventions Encouraged to exercise      Quality of Life Scores:  PHQ-9:     Recent Review Flowsheet Data    Depression screen [facility] 2/9 04/05/2015   Decreased Interest 1   Down, Depressed, Hopeless 0   PHQ - 2 Score 1   Altered sleeping 1   Tired, decreased energy 2   Change in appetite 2   Feeling bad or failure about yourself  0   Trouble concentrating 0   Moving slowly or fidgety/restless 0   Suicidal thoughts 0   PHQ-9 Score 6   Difficult doing work/chores Not difficult at all      Psychosocial Evaluation and Intervention:   Psychosocial Re-Evaluation:   Vocational Rehabilitation: Provide vocational rehab assistance to qualifying candidates.   Vocational Rehab Evaluation & Intervention:     Vocational Rehab - 04/05/15 1324    Initial Vocational Rehab Evaluation & Intervention   Assessment shows need for Vocational Rehabilitation No      Education: Education Goals: Education classes will be provided on a weekly basis, covering required topics. Participant will state understanding/return demonstration of topics presented.  Learning Barriers/Preferences:     Learning Barriers/Preferences - 04/05/15 1323    Learning Barriers/Preferences   Learning Barriers None   Learning Preferences None      Education Topics: General Nutrition Guidelines/Fats and Fiber: -Group instruction provided by verbal, written material, models and posters to present the general guidelines for heart healthy nutrition. Gives an explanation and review of dietary fats and fiber.   Controlling Sodium/Reading Food Labels: -Group verbal and written material supporting the discussion of  sodium use in heart healthy nutrition. Review and explanation with models, verbal and written materials for utilization of the food label.   Exercise Physiology & Risk Factors: - Group verbal and written instruction with models to review the exercise physiology of the cardiovascular system and associated critical values. Details cardiovascular disease risk factors and the goals associated with each risk factor.   Aerobic Exercise & Resistance Training: - Gives group verbal and written discussion on the health impact of inactivity. On the components of aerobic and resistive training programs and the benefits of this training and how to safely progress through these programs.   Flexibility, Balance, General Exercise Guidelines: - Provides group verbal and written instruction on the benefits of flexibility and balance training programs. Provides general exercise guidelines with specific guidelines to those with heart or lung disease. Demonstration and skill practice provided.   Stress Management: - Provides group verbal and written instruction about the health risks of elevated stress, cause of high stress, and healthy ways to reduce stress.   Depression: - Provides group verbal and written instruction on the correlation between heart/lung disease and depressed mood, treatment options, and the stigmas associated with seeking treatment.   Anatomy & Physiology of the Heart: - Group verbal and written instruction and models provide basic cardiac anatomy and physiology, with the coronary electrical and arterial systems. Review of: AMI, Angina, Valve disease, Heart Failure, Cardiac Arrhythmia, Pacemakers, and the ICD.   Cardiac Procedures: - Group verbal and written instruction and models to describe the testing methods done to diagnose heart disease. Reviews the outcomes of the test results. Describes the treatment choices: Medical Management, Angioplasty, or Coronary Bypass  Surgery.   Cardiac Medications: - Group verbal and written instruction to review commonly prescribed medications for heart disease. Reviews the medication, class of the drug, and side effects. Includes the steps to properly store meds and maintain the prescription regimen.   Go Sex-Intimacy & Heart Disease, Get SMART -  Goal Setting: - Group verbal and written instruction through game format to discuss heart disease and the return to sexual intimacy. Provides group verbal and written material to discuss and apply goal setting through the application of the S.M.A.R.T. Method.   Other Matters of the Heart: - Provides group verbal, written materials and models to describe Heart Failure, Angina, Valve Disease, and Diabetes in the realm of heart disease. Includes description of the disease process and treatment options available to the cardiac patient.   Exercise & Equipment Safety: - Individual verbal instruction and demonstration of equipment use and safety with use of the equipment.          Cardiac Rehab from 04/05/2015 in [facility] Cardiac Rehab   Date  04/05/15   Educator  SB   Instruction Review Code  2- meets goals/outcomes      Infection Prevention: - Provides verbal and written material to individual with discussion of infection control including proper hand washing and proper equipment cleaning during exercise session.      Cardiac Rehab from 04/05/2015 in [facility] Cardiac Rehab   Date  04/05/15   Educator  SB   Instruction Review Code  2- meets goals/outcomes      Falls Prevention: - Provides verbal and written material to individual with discussion of falls prevention and safety.      Cardiac Rehab from 04/05/2015 in [facility] Cardiac Rehab   Date  04/05/15   Educator  SB   Instruction Review Code  2- meets goals/outcomes      Diabetes: - Individual verbal and written instruction to review signs/symptoms of diabetes, desired ranges of glucose level fasting, after meals and with  exercise. Advice that pre and post exercise glucose checks will be done for 3 sessions at entry of program.    Knowledge Questionnaire Score:     Knowledge Questionnaire Score - 04/05/15 1324    Knowledge Questionnaire Score   Pre Score 21/27      Personal Goals and Risk Factors at Admission:     Personal Goals and Risk Factors at Admission - 04/05/15 1333    Personal Goals and Risk Factors on Admission   Increase Aerobic Exercise and Physical Activity Sedentary  Recently had both hip replacements.  Has pain in the left knee. Today received injection in the knee. Wants to return to her ability to clean her house  the  way she was able to prior to her hip surgeries and the heart attack..   Intervention Provide exercise education and an individualized exercise prescription that will provide continued progressive overload as per policy without signs/symptoms of physical distress.   Hypertension Yes   Goal Participant will see blood pressure controlled within the values of 140/48mm/Hg or within value directed by their physician.   Intervention Provide nutrition & aerobic exercise along with prescribed medications to achieve BP 140/90 or less.   Lipids Yes   Goal Cholesterol controlled with medications as prescribed, with individualized exercise RX and with personalized nutrition plan. Value goals: LDL < 70mg , HDL > 40mg . Participant states understanding of desired cholesterol values and following prescriptions.   Intervention Provide nutrition & aerobic exercise along with prescribed medications to achieve LDL 70mg , HDL >40mg .   Stress Yes  Stress is present every day.  Maisyn states she can "deal with stress"  well.  Her husband has been diagnosed with dementia.    Goal To meet with psychosocial counselor for stress and relaxation information and guidance. To state understanding of performing relaxation  techniques and or identifying personal stressors.   Intervention Provide education on  types of stress, identifiying stressors, and ways to cope with stress. Provide demonstration and active practice of relaxation techniques.      Personal Goals and Risk Factors Review:    Personal Goals Discharge (Final Personal Goals and Risk Factors Review):    ITP Comments:     ITP Comments      04/05/15 1326 04/05/15 1328 04/23/15 1535       ITP Comments Initial ITP created today for review and sign off by Dr Loleta Chance. Unable to walk today Had injection in the left knee.   Mckinnley completed her functional test evaluation today.  She plans to start the sessions next week.        Comments: initial ITP

## 2015-04-27 DIAGNOSIS — Z9861 Coronary angioplasty status: Secondary | ICD-10-CM

## 2015-04-27 DIAGNOSIS — I2102 ST elevation (STEMI) myocardial infarction involving left anterior descending coronary artery: Secondary | ICD-10-CM

## 2015-04-27 NOTE — Progress Notes (Signed)
Daily Session Note  Patient Details  Name: Monique Burns MRN: [id number] Date of Birth: May 19, 1930 Referring Provider:  Yolonda Kida, MD  Encounter Date: 04/27/2015  Check In:     Session Check In - 04/27/15 0901    Check-In   Staff Present Candiss Norse, MS, ACSM CEP, Exercise Physiologist;Diane Joya Gaskins, RN, Apolonio Schneiders, BS, Exercise Physiologist   ER physicians immediately available to respond to emergencies See telemetry face sheet for immediately available ER MD   Medication changes reported     No   Fall or balance concerns reported    No   Warm-up and Cool-down Performed on first and last piece of equipment   VAD Patient? No   Pain Assessment   Currently in Pain? No/denies           Exercise Prescription Changes - 04/27/15 0900    Exercise Review   Progression No   Response to Exercise   Comments First day of exercises! Patient was oriented to the equipment and exercise prescription was discussed. Patient was able to complete exercise today without signs or symptoms.       Goals Met:  Exercise tolerated well No report of cardiac concerns or symptoms Strength training completed today  Goals Unmet:  Not Applicable  Goals Comments:    Dr. Emily Filbert is Medical Director for [place] and [facility].

## 2015-04-29 ENCOUNTER — Encounter: Payer: Medicare Other | Admitting: *Deleted

## 2015-04-29 DIAGNOSIS — Z9861 Coronary angioplasty status: Secondary | ICD-10-CM

## 2015-04-29 DIAGNOSIS — I2102 ST elevation (STEMI) myocardial infarction involving left anterior descending coronary artery: Secondary | ICD-10-CM

## 2015-04-29 NOTE — Progress Notes (Signed)
Daily Session Note  Patient Details  Name: Monique Burns MRN: [id number] Date of Birth: Jul 17, 1930 Referring Provider:  Yolonda Kida, MD  Encounter Date: 04/29/2015  Check In:     Session Check In - 04/29/15 0945    Check-In   Staff Present Gerlene Burdock, RN, Moises Blood, BS, ACSM CEP, Exercise Physiologist;Kendall Otis Peak, Exercise Physiologist   ER physicians immediately available to respond to emergencies See telemetry face sheet for immediately available ER MD   Medication changes reported     No   Fall or balance concerns reported    No   Warm-up and Cool-down Performed on first and last piece of equipment   VAD Patient? No   Pain Assessment   Currently in Pain? No/denies   Multiple Pain Sites No         Goals Met:  Independence with exercise equipment Exercise tolerated well No report of cardiac concerns or symptoms Strength training completed today  Goals Unmet:  Not Applicable  Goals Comments: Patient completed exercise prescription and all exercise goals during rehab session. The exercise was tolerated well and the patient is progressing in the program.     Dr. Emily Filbert is Medical Director for [place] and [facility].

## 2015-05-04 DIAGNOSIS — I2102 ST elevation (STEMI) myocardial infarction involving left anterior descending coronary artery: Secondary | ICD-10-CM

## 2015-05-04 DIAGNOSIS — Z9861 Coronary angioplasty status: Secondary | ICD-10-CM

## 2015-05-04 NOTE — Progress Notes (Signed)
Daily Session Note  Patient Details  Name: Monique Burns MRN: [id number] Date of Birth: [birth year] Referring Provider:  Yolonda Kida, MD  Encounter Date: 05/04/2015  Check In:     Session Check In - 05/04/15 [place]    Check-In   Staff Present Candiss Norse, MS, ACSM CEP, Exercise Physiologist;Diane Joya Gaskins, RN, Apolonio Schneiders, BS, Exercise Physiologist   ER physicians immediately available to respond to emergencies See telemetry face sheet for immediately available ER MD   Medication changes reported     No   Fall or balance concerns reported    No   Warm-up and Cool-down Performed on first and last piece of equipment   VAD Patient? No   Pain Assessment   Currently in Pain? No/denies         Goals Met:  Independence with exercise equipment Exercise tolerated well No report of cardiac concerns or symptoms Strength training completed today  Goals Unmet:  Not Applicable  Goals Comments:    Dr. Emily Filbert is Medical Director for [place] and [facility].

## 2015-05-05 ENCOUNTER — Emergency Department: Payer: Medicare Other

## 2015-05-05 ENCOUNTER — Emergency Department
Admission: EM | Admit: 2015-05-05 | Discharge: 2015-05-06 | Disposition: A | Discharge status: Other Acute Inpatient Hospital | Payer: Medicare Other | Attending: Emergency Medicine | Admitting: Emergency Medicine

## 2015-05-05 ENCOUNTER — Encounter: Payer: Self-pay | Admitting: *Deleted

## 2015-05-05 ENCOUNTER — Other Ambulatory Visit: Payer: Self-pay

## 2015-05-05 DIAGNOSIS — Z79899 Other long term (current) drug therapy: Secondary | ICD-10-CM | POA: Diagnosis not present

## 2015-05-05 DIAGNOSIS — I129 Hypertensive chronic kidney disease with stage 1 through stage 4 chronic kidney disease, or unspecified chronic kidney disease: Secondary | ICD-10-CM | POA: Insufficient documentation

## 2015-05-05 DIAGNOSIS — R079 Chest pain, unspecified: Secondary | ICD-10-CM | POA: Diagnosis not present

## 2015-05-05 DIAGNOSIS — Z7982 Long term (current) use of aspirin: Secondary | ICD-10-CM | POA: Insufficient documentation

## 2015-05-05 DIAGNOSIS — N189 Chronic kidney disease, unspecified: Secondary | ICD-10-CM | POA: Insufficient documentation

## 2015-05-05 DIAGNOSIS — I252 Old myocardial infarction: Secondary | ICD-10-CM | POA: Diagnosis not present

## 2015-05-05 HISTORY — DX: Acute myocardial infarction, unspecified: I21.9

## 2015-05-05 LAB — PROTIME-INR
INR: 1.03
Prothrombin Time: 13.7 seconds (ref 11.4–15.0)

## 2015-05-05 LAB — BASIC METABOLIC PANEL
Anion gap: 9 (ref 5–15)
BUN: 19 mg/dL (ref 6–20)
CALCIUM: 8.9 mg/dL (ref 8.9–10.3)
CO2: 22 mmol/L (ref 22–32)
CREATININE: 1 mg/dL (ref 0.44–1.00)
Chloride: 103 mmol/L (ref 101–111)
GFR, EST AFRICAN AMERICAN: 59 mL/min — AB (ref >60)
GFR, EST NON AFRICAN AMERICAN: 51 mL/min — AB (ref >60)
Glucose, Bld: 120 mg/dL — ABNORMAL HIGH (ref 65–99)
Potassium: 3.8 mmol/L (ref 3.5–5.1)
SODIUM: 134 mmol/L — AB (ref 135–145)

## 2015-05-05 LAB — CBC
HEMATOCRIT: 32.4 % — AB (ref 35.0–47.0)
Hemoglobin: 11 g/dL — ABNORMAL LOW (ref 12.0–16.0)
MCH: 29.7 pg (ref 26.0–34.0)
MCHC: 34 g/dL (ref 32.0–36.0)
MCV: 87.4 fL (ref 80.0–100.0)
Platelets: 300 10*3/uL (ref 150–440)
RBC: 3.71 MIL/uL — AB (ref 3.80–5.20)
RDW: 13.8 % (ref 11.5–14.5)
WBC: 8.8 10*3/uL (ref 3.6–11.0)

## 2015-05-05 LAB — TROPONIN I: Troponin I: <0.03 ng/mL (ref <0.031)

## 2015-05-05 LAB — APTT: aPTT: 31 seconds (ref 24–36)

## 2015-05-05 MED ORDER — NITROGLYCERIN 2 % TD OINT
1.0000 [in_us] | TOPICAL_OINTMENT | Freq: Once | TRANSDERMAL | Status: AC
  Administered 2015-05-05: 1 [in_us] via TOPICAL
  Filled 2015-05-05: qty 1

## 2015-05-05 MED ORDER — ASPIRIN 81 MG PO CHEW
324.0000 mg | CHEWABLE_TABLET | Freq: Once | ORAL | Status: AC
  Administered 2015-05-05: 324 mg via ORAL
  Filled 2015-05-05: qty 4

## 2015-05-05 NOTE — ED Provider Notes (Addendum)
[facility] Emergency Department Provider Note  ____________________________________________   I have reviewed the triage vital signs and the nursing notes.   HISTORY  Chief Complaint Chest Pain    HPI Monique Burns is a 81 y.o. 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 She is here because of a cough. She states she has been coughing since Sunday and since yesterday she has had an uncomfortable feeling in her chest. The feeling is nothing like her prior MI. Her MI and radiation to her chest wall. Her complaint here is cough and discomfort with coughing and a discomfort just generally speaking in her chest which is different from what she had in October. The patient states this is nothing like her angina and again it has been uninterrupted since yesterday morning. He is concerned she has pneumonia.  Past Medical History  Diagnosis Date  . Hypertension   . Renal disorder   . Arthritis   . Thyroid disease   . Hyperlipidemia   . Leukopenia   . MI (myocardial infarction) [facility])     Patient Active Problem List   Diagnosis Date Noted  . Chronic kidney disease 04/05/2015  . HLD (hyperlipidemia) 04/05/2015  . BP (high blood pressure) 04/05/2015  . Decreased leukocytes 04/05/2015  . Arthritis, degenerative 04/05/2015  . Disease of thyroid gland 04/05/2015  . Acute myocardial infarction of anterior wall ([place]) 02/28/2015  . Degenerative arthritis of hip 10/31/2013    Past Surgical History  Procedure Laterality Date  . Coronary angioplasty with stent placement      Current Outpatient Rx  Name  Route  Sig  Dispense  Refill  . aspirin 81 MG chewable tablet   Oral   Chew by mouth.         Marland Kitchen atorvastatin (LIPITOR) 80 MG tablet   Oral   Take by mouth.         . B Complex-Biotin-FA (BALANCED B-100 PO)   Oral   Take 1 tablet by  mouth daily.         . calcium-vitamin D (OSCAL WITH D) 500-200 MG-UNIT tablet   Oral   Take 1 tablet by mouth daily.         . Cholecalciferol (VITAMIN D3) 1000 UNITS CAPS   Oral   Take 1 tablet by mouth daily.         . cycloSPORINE (RESTASIS) 0.05 % ophthalmic emulsion   Ophthalmic   Apply 1 drop to eye.         . DULoxetine (CYMBALTA) 30 MG capsule   Oral   Take 30 mg by mouth daily.      2   . isosorbide mononitrate (IMDUR) 60 MG 24 hr tablet   Oral   Take 0.5 tablets by mouth. Taking half a tablet per md instructions         . metoprolol succinate (TOPROL-XL) 25 MG 24 hr tablet   Oral   Take 0.5 tablets by mouth.         . nitroGLYCERIN (NITROSTAT) 0.4 MG SL tablet   Sublingual   Place under the tongue.         Marland Kitchen oxyCODONE-acetaminophen (PERCOCET/ROXICET) 5-325 MG tablet   Oral   Take 1 tablet by mouth every 6 (six) hours as needed.      0   . polyethylene glycol powder (GLYCOLAX/MIRALAX) powder   Oral  Take by mouth.         . ranitidine (ZANTAC) 150 MG capsule   Oral   Take 1 capsule by mouth 2 (two) times daily.           Allergies Review of patient's allergies indicates no known allergies.  No family history on file.  Social History Social History  Substance Use Topics  . Smoking status: Never Smoker   . Smokeless tobacco: None  . Alcohol Use: No    Review of Systems Constitutional: No fever/chills Eyes: No visual changes. ENT: No sore throat. No stiff neck no neck pain Cardiovascular: See history of present illness Respiratory: The history of present illness Gastrointestinal:   no vomiting.  No diarrhea.  No constipation. Genitourinary: Negative for dysuria. Musculoskeletal: Negative lower extremity swelling Skin: Negative for rash. Neurological: Negative for headaches, focal weakness or numbness. 10-point ROS otherwise negative.  ____________________________________________   PHYSICAL EXAM:  VITAL SIGNS: ED  Triage Vitals  Enc Vitals Group     BP --      Pulse --      Resp --      Temp --      Temp src --      SpO2 --      Weight --      Height --      Head Cir --      Peak Flow --      Pain Score --      Pain Loc --      Pain Edu? --      Excl. in [place]? --     Constitutional: Alert and oriented. Well appearing and in no acute distress. Eyes: Conjunctivae are normal. PERRL. EOMI. Head: Atraumatic. Nose: No congestion/rhinnorhea. Mouth/Throat: Mucous membranes are moist.  Oropharynx non-erythematous. Neck: No stridor.   Nontender with no meningismus Cardiovascular: Normal rate, regular rhythm. Grossly normal heart sounds.  Good peripheral circulation. Respiratory: Normal respiratory effort.  No retractions. Lungs CTAB. Abdominal: Soft and nontender. No distention. No guarding no rebound Back:  There is no focal tenderness or step off there is no midline tenderness there are no lesions noted. there is no CVA tenderness Musculoskeletal: No lower extremity tenderness. No joint effusions, no DVT signs strong distal pulses no edema Neurologic:  Normal speech and language. No gross focal neurologic deficits are appreciated.  Skin:  Skin is warm, dry and intact. No rash noted. Psychiatric: Mood and affect are normal. Speech and behavior are normal.  ____________________________________________   LABS (all labs ordered are listed, but only abnormal results are displayed)  Labs Reviewed  BASIC METABOLIC PANEL  CBC  TROPONIN I   ____________________________________________  EKG  I personally interpreted any EKGs ordered by me or triage Sinus rhythm rate 81 bpm there is ST elevation in V1 and V2 and ST depression in V5 V6, however the ST elevation is not comparable to what she had before in the same distribution. ST depression seems to be new but again we do not have an EKG from after her myocardial infarction.  ----------------------------------------- 11:19 PM on  05/05/2015 -----------------------------------------  EKG: Sinus rhythm at 79 bpm, ST elevation anteriorly is much reduced in V1 and the morphology is better in V2. ST depression in V5 and V6 is greatly improved as well. ____________________________________________  RADIOLOGY  I reviewed any imaging ordered by me or triage that were performed during my shift ____________________________________________   PROCEDURES  Procedure(s) performed: None  Critical Care performed: None  ____________________________________________  INITIAL IMPRESSION / ASSESSMENT AND PLAN / ED COURSE  Pertinent labs & imaging results that were available during my care of the patient were reviewed by me and considered in my medical decision making (see chart for details).  Concerning EKG however story is not consistent with patient's angina and she is in an interrupted discomfort of a noncardiac variety in her opinion since yesterday morning in the context of a cough. She describes it as a slight pressure she feels that she has a pneumonia. I have called the on-call deep fellow and we will fax this EKG to them to see if they wish to act more precipitously given her history I do not feel that she at this time needs to go to the based on this EKG especially since we do not have any post MI EKGs for this patient.  ----------------------------------------- 10:12 PM on 05/05/2015 -----------------------------------------  We are awaiting callback from cardiology. They're apparently in a STEMI at [facility]. They're the ones who most likely have her post-MI EKGs.   ----------------------------------------- 10:40 PM on 05/05/2015 -----------------------------------------  D/w Gerhard Perches, MD of [place] [facility].  Initial troponin is negative. They will call me back they agree pt does not need to go as stemi.  ----------------------------------------- 11:06 PM on  05/05/2015 -----------------------------------------  Patient is pain-free at this time with no chest discomfort or pressure after nitroglycerin which is reassuring. ____________________________________________   FINAL CLINICAL IMPRESSION(S) / ED DIAGNOSES  Final diagnoses:  None     Schuyler Amor, MD 05/05/15 [place], MD 05/05/15 [id number]  Schuyler Amor, MD 05/05/15 [place], MD 05/05/15 [id number]  Schuyler Amor, MD 05/05/15 2320

## 2015-05-05 NOTE — ED Notes (Signed)
Patient states she has a hx. Of MI 3 weeks ago with one stent placed. Patient c/o chest pressure, dizziness, nausea and shortness of breath today. Patient states she has had a cough since Sunday.

## 2015-05-05 NOTE — ED Notes (Signed)
Xray tech performing portable xray.

## 2015-05-06 ENCOUNTER — Telehealth: Payer: Self-pay

## 2015-05-06 NOTE — ED Notes (Signed)
Pt presents to Ed with c/o chest pressure since Sunday morning. Reports she has also been coughing since Sunday morning. Pt also c/o dizziness, nausea, and shortness of breath today. Pt has hx on MI, had a stent placed 3 weeks ago. Pt reports symptoms do not feel similar to previous MI. Pt alert and oriented x 4, no increased work in breathing noted, skin warm and dry. Pt speaking in complete sentences. Dr. Burlene Arnt at bedside.

## 2015-05-06 NOTE — Telephone Encounter (Signed)
Patient called and stated she would not be in class due to chest discomfort and she is at [facility] for a cath.

## 2015-05-09 NOTE — Progress Notes (Signed)
Cardiac Individual Treatment Plan  Patient Details  Name: Monique Burns MRN: [id number] Date of Birth: 08-24-1930 Referring Provider:  Yolonda Kida, MD  Initial Encounter Date:    Visit Diagnosis: ST elevation (STEMI) myocardial infarction involving left anterior descending coronary artery (HCC)  S/P PTCA (percutaneous transluminal coronary angioplasty)  Patient's Home Medications on Admission:  Current outpatient prescriptions:  .  aspirin 81 MG chewable tablet, Chew by mouth., Disp: , Rfl:  .  atorvastatin (LIPITOR) 80 MG tablet, Take 80 mg by mouth daily at 6 PM. , Disp: , Rfl:  .  B Complex-Biotin-FA (BALANCED B-100 PO), Take 1 tablet by mouth daily., Disp: , Rfl:  .  calcium-vitamin D (OSCAL WITH D) 500-200 MG-UNIT tablet, Take 1 tablet by mouth daily., Disp: , Rfl:  .  Cholecalciferol (VITAMIN D3) 1000 UNITS CAPS, Take 1 tablet by mouth daily., Disp: , Rfl:  .  cycloSPORINE (RESTASIS) 0.05 % ophthalmic emulsion, Place 1 drop into both eyes 2 (two) times daily. , Disp: , Rfl:  .  DULoxetine (CYMBALTA) 30 MG capsule, Take 30 mg by mouth daily., Disp: , Rfl: 2 .  isosorbide mononitrate (IMDUR) 60 MG 24 hr tablet, Take 60 mg by mouth. Taking half a tablet per md instructions, Disp: , Rfl:  .  metoprolol succinate (TOPROL-XL) 25 MG 24 hr tablet, Take 0.5 tablets by mouth daily. , Disp: , Rfl:  .  nitroGLYCERIN (NITROSTAT) 0.4 MG SL tablet, Place under the tongue., Disp: , Rfl:  .  polyethylene glycol powder (GLYCOLAX/MIRALAX) powder, Take by mouth., Disp: , Rfl:  .  ranitidine (ZANTAC) 150 MG capsule, Take 1 capsule by mouth 2 (two) times daily., Disp: , Rfl:   Past Medical History: Past Medical History  Diagnosis Date  . Hypertension   . Renal disorder   . Arthritis   . Thyroid disease   . Hyperlipidemia   . Leukopenia   . MI (myocardial infarction) ([place])     Tobacco Use: History  Smoking status  . Never Smoker   Smokeless tobacco  . Not on file     Labs: Recent Review Flowsheet Data    There is no flowsheet data to display.       Exercise Target Goals:    Exercise Program Goal: Individual exercise prescription set with THRR, safety & activity barriers. Participant demonstrates ability to understand and report RPE using BORG scale, to self-measure pulse accurately, and to acknowledge the importance of the exercise prescription.  Exercise Prescription Goal: Starting with aerobic activity 30 plus minutes a day, 3 days per week for initial exercise prescription. Provide home exercise prescription and guidelines that participant acknowledges understanding prior to discharge.  Activity Barriers & Risk Stratification:     Activity Barriers & Risk Stratification - 04/05/15 1323    Activity Barriers & Risk Stratification   Activity Barriers Arthritis;Right Hip Replacement;Left Hip Replacement;Deconditioning;Joint Problems   Risk Stratification High      6 Minute Walk:     6 Minute Walk      04/23/15 1303       6 Minute Walk   Phase Initial     Distance 815 feet     Walk Time 6 minutes     Resting HR 77 bpm     Resting BP 126/64 mmHg     Max Ex. HR 102 bpm     Max Ex. BP 140/58 mmHg     RPE 11     Symptoms Yes (comment)  Comments slight dizziness and unsteadiness         Initial Exercise Prescription:     Initial Exercise Prescription - 04/23/15 1300    Date of Initial Exercise Prescription   Date 04/23/15   Treadmill   MPH 1.2   Grade 0   Minutes 10   Bike   Level 0.2   Watts 15   Minutes 10   Recumbant Bike   Level 2   RPM 40   Watts 15   Minutes 15   NuStep   Level 2   Watts 25   Minutes 15   Arm Ergometer   Level 1   Watts 8   Minutes 10   Arm/Foot Ergometer   Level 4   Watts 12   Minutes 10   Cybex   Level 1   RPM 50   Minutes 10   Recumbant Elliptical   Level 1   RPM 40   Watts 10   Minutes 10   REL-XR   Level 2   Watts 30   Minutes 15   T5 Nustep   Level 1    Watts 12   Minutes 15   Biostep-RELP   Level 2   Watts 15   Minutes 15   Prescription Details   Frequency (times per week) 3   Duration Progress to 30 minutes of continuous aerobic without signs/symptoms of physical distress   Intensity   THRR REST +  30   Ratings of Perceived Exertion 11-15   Progression Continue progressive overload as per policy without signs/symptoms or physical distress.   Resistance Training   Training Prescription Yes   Weight 2   Reps 10-15      Exercise Prescription Changes:     Exercise Prescription Changes      04/27/15 0900 05/04/15 1503         Exercise Review   Progression No       Response to Exercise   Blood Pressure (Admit)  118/62 mmHg      Blood Pressure (Exercise)  124/62 mmHg      Blood Pressure (Exit)  136/70 mmHg      Heart Rate (Admit)  76 bpm      Heart Rate (Exercise)  99 bpm      Heart Rate (Exit)  75 bpm      Rating of Perceived Exertion (Exercise)  13      Symptoms  none      Comments First day of exercises! Patient was oriented to the equipment and exercise prescription was discussed. Patient was able to complete exercise today without signs or symptoms.        Duration  Progress to 30 minutes of continuous aerobic without signs/symptoms of physical distress      Intensity  THRR unchanged      Progression  Continue progressive overload as per policy without signs/symptoms or physical distress.      Resistance Training   Training Prescription  Yes      Weight  2      Reps  10-15      Interval Training   Interval Training  No      Treadmill   MPH  1      Grade  0      Minutes  15      NuStep   Level  4      Watts  20      Minutes  15  Discharge Exercise Prescription (Final Exercise Prescription Changes):     Exercise Prescription Changes - 05/04/15 1503    Response to Exercise   Blood Pressure (Admit) 118/62 mmHg   Blood Pressure (Exercise) 124/62 mmHg   Blood Pressure (Exit) 136/70 mmHg   Heart  Rate (Admit) 76 bpm   Heart Rate (Exercise) 99 bpm   Heart Rate (Exit) 75 bpm   Rating of Perceived Exertion (Exercise) 13   Symptoms none   Duration Progress to 30 minutes of continuous aerobic without signs/symptoms of physical distress   Intensity THRR unchanged   Progression Continue progressive overload as per policy without signs/symptoms or physical distress.   Resistance Training   Training Prescription Yes   Weight 2   Reps 10-15   Interval Training   Interval Training No   Treadmill   MPH 1   Grade 0   Minutes 15   NuStep   Level 4   Watts 20   Minutes 15      Nutrition:  Target Goals: Understanding of nutrition guidelines, daily intake of sodium <1580m, cholesterol <2061m calories 30% from fat and 7% or less from saturated fats, daily to have 5 or more servings of fruits and vegetables.  Biometrics:     Pre Biometrics - 04/23/15 1301    Pre Biometrics   Height _0  (1.727 m)   Weight 120 lb 14.4 oz (54.84 kg)   Waist Circumference 27 inches   Hip Circumference 35.25 inches   Waist to Hip Ratio 0.77 %   BMI (Calculated) 18.4       Nutrition Therapy Plan and Nutrition Goals:   Nutrition Discharge: Rate Your Plate Scores:     Rate Your Plate - 1278/29/5692130  Rate Your Plate Scores   Pre Score 72   Pre Score % 80 %      Nutrition Goals Re-Evaluation:   Psychosocial: Target Goals: Acknowledge presence or absence of depression, maximize coping skills, provide positive support system. Participant is able to verbalize types and ability to use techniques and skills needed for reducing stress and depression.  Initial Review & Psychosocial Screening:     Initial Psych Review & Screening - 04/05/15 1341    Initial Review   Current issues with Current Stress Concerns   Source of Stress Concerns Chronic Illness;Unable to perform yard/household activities   Comments Sherena's husband has been diagnosed with dementia. She deals with this daily.     Family Dynamics   Good Support System? Yes   Comments JeVincenzaas good family support.  Children , nieces and nephews   Barriers   Psychosocial barriers to participate in program There are no identifiable barriers or psychosocial needs.;The patient should benefit from training in stress management and relaxation.   Screening Interventions   Interventions Encouraged to exercise      Quality of Life Scores:   PHQ-9:     Recent Review Flowsheet Data    Depression screen [facility]9 04/05/2015   Decreased Interest 1   Down, Depressed, Hopeless 0   PHQ - 2 Score 1   Altered sleeping 1   Tired, decreased energy 2   Change in appetite 2   Feeling bad or failure about yourself  0   Trouble concentrating 0   Moving slowly or fidgety/restless 0   Suicidal thoughts 0   PHQ-9 Score 6   Difficult doing work/chores Not difficult at all      Psychosocial Evaluation and Intervention:  Psychosocial Evaluation - 05/04/15 0939    Psychosocial Evaluation & Interventions   Interventions Encouraged to exercise with the program and follow exercise prescription;Stress management education   Comments Counselor met with Ms. Haller today for initial psychosocial evaluattion.  She is an 82 year old who had a heart attack the last week of October.  She has a strong support system with a spouse of 30 years and several daughters/daughter in laws who live close by.  She is also actively involved in her local church community.  Ms. Darnell Level has other health issues that are stressful for her in addition to her heart problems, she also has severe arthritis and had both hips replaced a year ago.  She has suffered multiple deaths in her life with a son and spouse and (3) brothers, but she denies a history of depression or anxiety or any current symptoms.  Ms. Darnell Level reports relying on her faith to help her through this.  She has goals for this program to increase her strength and stamina so she will be able to "do normal stuff  like clean the house, etc, and be able to walk outside with the neighbors."  Ms. G will benefit from stress management education as she has a spouse who has dementia and this is stressful for her to care for him at this time.     Continued Psychosocial Services Needed Yes  Ms. G will benefit from stress management education to help her cope with her current stressors.      Psychosocial Re-Evaluation:   Vocational Rehabilitation: Provide vocational rehab assistance to qualifying candidates.   Vocational Rehab Evaluation & Intervention:     Vocational Rehab - 04/05/15 1324    Initial Vocational Rehab Evaluation & Intervention   Assessment shows need for Vocational Rehabilitation No      Education: Education Goals: Education classes will be provided on a weekly basis, covering required topics. Participant will state understanding/return demonstration of topics presented.  Learning Barriers/Preferences:     Learning Barriers/Preferences - 04/05/15 1323    Learning Barriers/Preferences   Learning Barriers None   Learning Preferences None      Education Topics: General Nutrition Guidelines/Fats and Fiber: -Group instruction provided by verbal, written material, models and posters to present the general guidelines for heart healthy nutrition. Gives an explanation and review of dietary fats and fiber.   Controlling Sodium/Reading Food Labels: -Group verbal and written material supporting the discussion of sodium use in heart healthy nutrition. Review and explanation with models, verbal and written materials for utilization of the food label.   Exercise Physiology & Risk Factors: - Group verbal and written instruction with models to review the exercise physiology of the cardiovascular system and associated critical values. Details cardiovascular disease risk factors and the goals associated with each risk factor.   Aerobic Exercise & Resistance Training: - Gives group verbal  and written discussion on the health impact of inactivity. On the components of aerobic and resistive training programs and the benefits of this training and how to safely progress through these programs.   Flexibility, Balance, General Exercise Guidelines: - Provides group verbal and written instruction on the benefits of flexibility and balance training programs. Provides general exercise guidelines with specific guidelines to those with heart or lung disease. Demonstration and skill practice provided.   Stress Management: - Provides group verbal and written instruction about the health risks of elevated stress, cause of high stress, and healthy ways to reduce stress.   Depression: -  Provides group verbal and written instruction on the correlation between heart/lung disease and depressed mood, treatment options, and the stigmas associated with seeking treatment.   Anatomy & Physiology of the Heart: - Group verbal and written instruction and models provide basic cardiac anatomy and physiology, with the coronary electrical and arterial systems. Review of: AMI, Angina, Valve disease, Heart Failure, Cardiac Arrhythmia, Pacemakers, and the ICD.   Cardiac Procedures: - Group verbal and written instruction and models to describe the testing methods done to diagnose heart disease. Reviews the outcomes of the test results. Describes the treatment choices: Medical Management, Angioplasty, or Coronary Bypass Surgery.          Cardiac Rehab from 04/27/2015 in [facility] Cardiac and Pulmonary Rehab   Date  04/27/15   Educator  DW   Instruction Review Code  2- meets goals/outcomes      Cardiac Medications: - Group verbal and written instruction to review commonly prescribed medications for heart disease. Reviews the medication, class of the drug, and side effects. Includes the steps to properly store meds and maintain the prescription regimen.   Go Sex-Intimacy & Heart Disease, Get SMART - Goal  Setting: - Group verbal and written instruction through game format to discuss heart disease and the return to sexual intimacy. Provides group verbal and written material to discuss and apply goal setting through the application of the S.M.A.R.T. Method.      Cardiac Rehab from 04/27/2015 in [facility] Cardiac and Pulmonary Rehab   Date  04/27/15   Educator  DW   Instruction Review Code  2- meets goals/outcomes      Other Matters of the Heart: - Provides group verbal, written materials and models to describe Heart Failure, Angina, Valve Disease, and Diabetes in the realm of heart disease. Includes description of the disease process and treatment options available to the cardiac patient.   Exercise & Equipment Safety: - Individual verbal instruction and demonstration of equipment use and safety with use of the equipment.      Cardiac Rehab from 04/27/2015 in [facility] Cardiac and Pulmonary Rehab   Date  04/05/15   Educator  SB   Instruction Review Code  2- meets goals/outcomes      Infection Prevention: - Provides verbal and written material to individual with discussion of infection control including proper hand washing and proper equipment cleaning during exercise session.      Cardiac Rehab from 04/27/2015 in [facility] Cardiac and Pulmonary Rehab   Date  04/05/15   Educator  SB   Instruction Review Code  2- meets goals/outcomes      Falls Prevention: - Provides verbal and written material to individual with discussion of falls prevention and safety.      Cardiac Rehab from 04/27/2015 in [facility] Cardiac and Pulmonary Rehab   Date  04/05/15   Educator  SB   Instruction Review Code  2- meets goals/outcomes      Diabetes: - Individual verbal and written instruction to review signs/symptoms of diabetes, desired ranges of glucose level fasting, after meals and with exercise. Advice that pre and post exercise glucose checks will be done for 3 sessions at entry of program.    Knowledge  Questionnaire Score:     Knowledge Questionnaire Score - 04/05/15 1324    Knowledge Questionnaire Score   Pre Score 21/27      Personal Goals and Risk Factors at Admission:     Personal Goals and Risk Factors at Admission - 04/05/15 1333  Personal Goals and Risk Factors on Admission   Increase Aerobic Exercise and Physical Activity Sedentary  Recently had both hip replacements.  Has pain in the left knee. Today received injection in the knee. Wants to return to her ability to clean her house  the  way she was able to prior to her hip surgeries and the heart attack..   Intervention Provide exercise education and an individualized exercise prescription that will provide continued progressive overload as per policy without signs/symptoms of physical distress.   Hypertension Yes   Goal Participant will see blood pressure controlled within the values of 140/5m/Hg or within value directed by their physician.   Intervention Provide nutrition & aerobic exercise along with prescribed medications to achieve BP 140/90 or less.   Lipids Yes   Goal Cholesterol controlled with medications as prescribed, with individualized exercise RX and with personalized nutrition plan. Value goals: LDL < 753m HDL > 4016mParticipant states understanding of desired cholesterol values and following prescriptions.   Intervention Provide nutrition & aerobic exercise along with prescribed medications to achieve LDL <74m28mDL >40mg49mStress Yes  Stress is present every day.  Katelynd Seidyes she can "deal with stress"  well.  Her husband has been diagnosed with dementia.    Goal To meet with psychosocial counselor for stress and relaxation information and guidance. To state understanding of performing relaxation techniques and or identifying personal stressors.   Intervention Provide education on types of stress, identifiying stressors, and ways to cope with stress. Provide demonstration and active practice of relaxation  techniques.      Personal Goals and Risk Factors Review:      Goals and Risk Factor Review      05/04/15 1031           Increase Aerobic Exercise and Physical Activity   Goals Progress/Improvement seen  Yes       Comments Patient reports she has seen an increase in energy and activity seen starting the Cardiac Rehab program.  Patient stated she was able to cook and entire meal all by herself for Christmas Eve family meal.  Patient stated she would not have been able to do this before.         Hypertension   Goal Participant will see blood pressure controlled within the values of 140/90mm/69mr within value directed by their physician.  Patient's BP on arrival to Cardiac Rehab has ranged from 110/62 - 126/64.  BP at the end of sessions has ranged from 116/62 to 136/70.         Abnormal Lipids   Goal Cholesterol controlled with medications as prescribed, with individualized exercise RX and with personalized nutrition plan. Value goals: LDL < 74mg, 48m> 40mg. P27mcipant states understanding of desired cholesterol values and following prescriptions.  No new lab values available at this time.            Personal Goals Discharge (Final Personal Goals and Risk Factors Review):      Goals and Risk Factor Review - 05/04/15 1031    Increase Aerobic Exercise and Physical Activity   Goals Progress/Improvement seen  Yes   Comments Patient reports she has seen an increase in energy and activity seen starting the Cardiac Rehab program.  Patient stated she was able to cook and entire meal all by herself for Christmas Eve family meal.  Patient stated she would not have been able to do this before.     Hypertension   Goal Participant  will see blood pressure controlled within the values of 140/85m/Hg or within value directed by their physician.  Patient's BP on arrival to Cardiac Rehab has ranged from 110/62 - 126/64.  BP at the end of sessions has ranged from 116/62 to 136/70.     Abnormal Lipids    Goal Cholesterol controlled with medications as prescribed, with individualized exercise RX and with personalized nutrition plan. Value goals: LDL < 759m HDL > 4040mParticipant states understanding of desired cholesterol values and following prescriptions.  No new lab values available at this time.        ITP Comments:     ITP Comments      04/05/15 1326 04/05/15 1328 04/23/15 1535 05/09/15 1309     ITP Comments Initial ITP created today for review and sign off by Dr M MLoleta Chancenable to walk today Had injection in the left knee.   JeaLynettampleted her functional test evaluation today.  She plans to start the sessions next week. Ready for 30 day review.  Continue with ITP  New to program ,has attended 3 sessions.       Comments:

## 2015-05-11 ENCOUNTER — Encounter: Payer: Medicare Other | Attending: Internal Medicine

## 2015-05-11 DIAGNOSIS — I2102 ST elevation (STEMI) myocardial infarction involving left anterior descending coronary artery: Secondary | ICD-10-CM | POA: Insufficient documentation

## 2015-05-11 DIAGNOSIS — Z9861 Coronary angioplasty status: Secondary | ICD-10-CM | POA: Insufficient documentation

## 2015-05-12 NOTE — Addendum Note (Signed)
Addended by: Lynford Humphrey on: 05/12/2015 11:23 AM   Modules accepted: Orders

## 2015-05-28 ENCOUNTER — Ambulatory Visit
Admission: RE | Admit: 2015-05-28 | Discharge: 2015-05-28 | Disposition: A | Discharge status: Home / Self Care | Payer: Medicare Other | Source: Ambulatory Visit | Attending: Internal Medicine | Admitting: Internal Medicine

## 2015-05-28 ENCOUNTER — Other Ambulatory Visit: Payer: Self-pay | Admitting: Internal Medicine

## 2015-05-28 DIAGNOSIS — Z1231 Encounter for screening mammogram for malignant neoplasm of breast: Secondary | ICD-10-CM

## 2015-06-03 ENCOUNTER — Telehealth: Payer: Self-pay | Admitting: *Deleted

## 2015-06-03 NOTE — Telephone Encounter (Signed)
Called to check on status to return to program. Has been out for medical concerns. Has had another stent placed 05/05/15   RSV, which is better now after 4.5 weeks.  Still has a cough, advised Dezarey I will check with Dr Pryor Ochoa  to get clearance to return.  Saw ortho MD recently, hip acting up. MD wants her to start exercising soon.

## 2015-06-03 NOTE — Progress Notes (Signed)
Cardiac Individual Treatment Plan  Patient Details  Name: Monique Burns MRN: [id number] Date of Birth: 1930-06-13 Referring Provider:  Yolonda Kida, MD  Initial Encounter Date:    Visit Diagnosis: ST elevation (STEMI) myocardial infarction involving left anterior descending coronary artery ([place]) - Plan: CARDIAC REHAB 30 DAY REVIEW  S/P PTCA (percutaneous transluminal coronary angioplasty) - Plan: CARDIAC REHAB 30 DAY REVIEW  Patient's Home Medications on Admission:  Current outpatient prescriptions:  .  aspirin 81 MG chewable tablet, Chew by mouth., Disp: , Rfl:  .  atorvastatin (LIPITOR) 80 MG tablet, Take 80 mg by mouth daily at 6 PM. , Disp: , Rfl:  .  B Complex-Biotin-FA (BALANCED B-100 PO), Take 1 tablet by mouth daily., Disp: , Rfl:  .  calcium-vitamin D (OSCAL WITH D) 500-200 MG-UNIT tablet, Take 1 tablet by mouth daily., Disp: , Rfl:  .  Cholecalciferol (VITAMIN D3) 1000 UNITS CAPS, Take 1 tablet by mouth daily., Disp: , Rfl:  .  cycloSPORINE (RESTASIS) 0.05 % ophthalmic emulsion, Place 1 drop into both eyes 2 (two) times daily. , Disp: , Rfl:  .  DULoxetine (CYMBALTA) 30 MG capsule, Take 30 mg by mouth daily., Disp: , Rfl: 2 .  isosorbide mononitrate (IMDUR) 60 MG 24 hr tablet, Take 60 mg by mouth. Taking half a tablet per md instructions, Disp: , Rfl:  .  metoprolol succinate (TOPROL-XL) 25 MG 24 hr tablet, Take 0.5 tablets by mouth daily. , Disp: , Rfl:  .  nitroGLYCERIN (NITROSTAT) 0.4 MG SL tablet, Place under the tongue., Disp: , Rfl:  .  polyethylene glycol powder (GLYCOLAX/MIRALAX) powder, Take by mouth., Disp: , Rfl:  .  ranitidine (ZANTAC) 150 MG capsule, Take 1 capsule by mouth 2 (two) times daily., Disp: , Rfl:   Past Medical History: Past Medical History  Diagnosis Date  . Hypertension   . Renal disorder   . Arthritis   . Thyroid disease   . Hyperlipidemia   . Leukopenia   . MI (myocardial infarction) ([place])     Tobacco Use: History  Smoking  status  . Never Smoker   Smokeless tobacco  . Not on file    Labs: Recent Review Flowsheet Data    There is no flowsheet data to display.       Exercise Target Goals:    Exercise Program Goal: Individual exercise prescription set with THRR, safety & activity barriers. Participant demonstrates ability to understand and report RPE using BORG scale, to self-measure pulse accurately, and to acknowledge the importance of the exercise prescription.  Exercise Prescription Goal: Starting with aerobic activity 30 plus minutes a day, 3 days per week for initial exercise prescription. Provide home exercise prescription and guidelines that participant acknowledges understanding prior to discharge.  Activity Barriers & Risk Stratification:     Activity Barriers & Risk Stratification - 04/05/15 1323    Activity Barriers & Risk Stratification   Activity Barriers Arthritis;Right Hip Replacement;Left Hip Replacement;Deconditioning;Joint Problems   Risk Stratification High      6 Minute Walk:     6 Minute Walk      04/23/15 1303       6 Minute Walk   Phase Initial     Distance 815 feet     Walk Time 6 minutes     Resting HR 77 bpm     Resting BP 126/64 mmHg     Max Ex. HR 102 bpm     Max Ex. BP 140/58 mmHg  RPE 11     Symptoms Yes (comment)     Comments slight dizziness and unsteadiness         Initial Exercise Prescription:     Initial Exercise Prescription - 04/23/15 1300    Date of Initial Exercise Prescription   Date 04/23/15   Treadmill   MPH 1.2   Grade 0   Minutes 10   Bike   Level 0.2   Watts 15   Minutes 10   Recumbant Bike   Level 2   RPM 40   Watts 15   Minutes 15   NuStep   Level 2   Watts 25   Minutes 15   Arm Ergometer   Level 1   Watts 8   Minutes 10   Arm/Foot Ergometer   Level 4   Watts 12   Minutes 10   Cybex   Level 1   RPM 50   Minutes 10   Recumbant Elliptical   Level 1   RPM 40   Watts 10   Minutes 10   REL-XR    Level 2   Watts 30   Minutes 15   T5 Nustep   Level 1   Watts 12   Minutes 15   Biostep-RELP   Level 2   Watts 15   Minutes 15   Prescription Details   Frequency (times per week) 3   Duration Progress to 30 minutes of continuous aerobic without signs/symptoms of physical distress   Intensity   THRR REST +  30   Ratings of Perceived Exertion 11-15   Progression Continue progressive overload as per policy without signs/symptoms or physical distress.   Resistance Training   Training Prescription Yes   Weight 2   Reps 10-15      Exercise Prescription Changes:     Exercise Prescription Changes      04/27/15 0900 05/04/15 1503 05/26/15 0700       Exercise Review   Progression No       Response to Exercise   Blood Pressure (Admit)  118/62 mmHg      Blood Pressure (Exercise)  124/62 mmHg      Blood Pressure (Exit)  136/70 mmHg      Heart Rate (Admit)  76 bpm      Heart Rate (Exercise)  99 bpm      Heart Rate (Exit)  75 bpm      Rating of Perceived Exertion (Exercise)  13      Symptoms  none      Comments First day of exercises! Patient was oriented to the equipment and exercise prescription was discussed. Patient was able to complete exercise today without signs or symptoms.   Patient as not attended since the date of the last 30 day review.     Duration  Progress to 30 minutes of continuous aerobic without signs/symptoms of physical distress      Intensity  THRR unchanged      Progression  Continue progressive overload as per policy without signs/symptoms or physical distress.      Resistance Training   Training Prescription  Yes      Weight  2      Reps  10-15      Interval Training   Interval Training  No      Treadmill   MPH  1      Grade  0      Minutes  15  NuStep   Level  4      Watts  20      Minutes  15         Discharge Exercise Prescription (Final Exercise Prescription Changes):     Exercise Prescription Changes - 05/26/15 0700    Response to  Exercise   Comments Patient as not attended since the date of the last 30 day review.      Nutrition:  Target Goals: Understanding of nutrition guidelines, daily intake of sodium <1531m, cholesterol <2037m calories 30% from fat and 7% or less from saturated fats, daily to have 5 or more servings of fruits and vegetables.  Biometrics:     Pre Biometrics - 04/23/15 1301    Pre Biometrics   Height 5' 8"  (1.727 m)   Weight 120 lb 14.4 oz (54.84 kg)   Waist Circumference 27 inches   Hip Circumference 35.25 inches   Waist to Hip Ratio 0.77 %   BMI (Calculated) 18.4       Nutrition Therapy Plan and Nutrition Goals:   Nutrition Discharge: Rate Your Plate Scores:     Rate Your Plate - 1232/67/1294580  Rate Your Plate Scores   Pre Score 72   Pre Score % 80 %      Nutrition Goals Re-Evaluation:   Psychosocial: Target Goals: Acknowledge presence or absence of depression, maximize coping skills, provide positive support system. Participant is able to verbalize types and ability to use techniques and skills needed for reducing stress and depression.  Initial Review & Psychosocial Screening:     Initial Psych Review & Screening - 04/05/15 1341    Initial Review   Current issues with Current Stress Concerns   Source of Stress Concerns Chronic Illness;Unable to perform yard/household activities   Comments Monique Burns's husband has been diagnosed with dementia. She deals with this daily.    Family Dynamics   Good Support System? Yes   Comments JeSadyeas good family support.  Children , nieces and nephews   Barriers   Psychosocial barriers to participate in program There are no identifiable barriers or psychosocial needs.;The patient should benefit from training in stress management and relaxation.   Screening Interventions   Interventions Encouraged to exercise      Quality of Life Scores:   PHQ-9:     Recent Review Flowsheet Data    [facility]    Decreased Interest 1   Down, Depressed, Hopeless 0   PHQ - 2 Score 1   Altered sleeping 1   Tired, decreased energy 2   Change in appetite 2   Feeling bad or failure about yourself  0   Trouble concentrating 0   Moving slowly or fidgety/restless 0   Suicidal thoughts 0   PHQ-9 Score 6   Difficult doing work/chores Not difficult at all      Psychosocial Evaluation and Intervention:     Psychosocial Evaluation - 05/04/15 0939    Psychosocial Evaluation & Interventions   Interventions Encouraged to exercise with the program and follow exercise prescription;Stress management education   Comments Counselor met with Ms. GaSiresoday for initial psychosocial evaluattion.  She is an [age]ear old who had a heart attack the last week of October.  She has a strong support system with a spouse of 30 years and several daughters/daughter in laws who live close by.  She is also actively involved in her local church community.  Ms. G Darnell Levelas other health issues  that are stressful for her in addition to her heart problems, she also has severe arthritis and had both hips replaced a year ago.  She has suffered multiple deaths in her life with a son and spouse and (3) brothers, but she denies a history of depression or anxiety or any current symptoms.  Ms. Darnell Level reports relying on her faith to help her through this.  She has goals for this program to increase her strength and stamina so she will be able to "do normal stuff like clean the house, etc, and be able to walk outside with the neighbors."  Ms. G will benefit from stress management education as she has a spouse who has dementia and this is stressful for her to care for him at this time.     Continued Psychosocial Services Needed Yes  Ms. G will benefit from stress management education to help her cope with her current stressors.      Psychosocial Re-Evaluation:   Vocational Rehabilitation: Provide vocational rehab assistance to qualifying candidates.    Vocational Rehab Evaluation & Intervention:     Vocational Rehab - 04/05/15 1324    Initial Vocational Rehab Evaluation & Intervention   Assessment shows need for Vocational Rehabilitation No      Education: Education Goals: Education classes will be provided on a weekly basis, covering required topics. Participant will state understanding/return demonstration of topics presented.  Learning Barriers/Preferences:     Learning Barriers/Preferences - 04/05/15 1323    Learning Barriers/Preferences   Learning Barriers None   Learning Preferences None      Education Topics: General Nutrition Guidelines/Fats and Fiber: -Group instruction provided by verbal, written material, models and posters to present the general guidelines for heart healthy nutrition. Gives an explanation and review of dietary fats and fiber.   Controlling Sodium/Reading Food Labels: -Group verbal and written material supporting the discussion of sodium use in heart healthy nutrition. Review and explanation with models, verbal and written materials for utilization of the food label.   Exercise Physiology & Risk Factors: - Group verbal and written instruction with models to review the exercise physiology of the cardiovascular system and associated critical values. Details cardiovascular disease risk factors and the goals associated with each risk factor.   Aerobic Exercise & Resistance Training: - Gives group verbal and written discussion on the health impact of inactivity. On the components of aerobic and resistive training programs and the benefits of this training and how to safely progress through these programs.   Flexibility, Balance, General Exercise Guidelines: - Provides group verbal and written instruction on the benefits of flexibility and balance training programs. Provides general exercise guidelines with specific guidelines to those with heart or lung disease. Demonstration and skill practice  provided.   Stress Management: - Provides group verbal and written instruction about the health risks of elevated stress, cause of high stress, and healthy ways to reduce stress.   Depression: - Provides group verbal and written instruction on the correlation between heart/lung disease and depressed mood, treatment options, and the stigmas associated with seeking treatment.   Anatomy & Physiology of the Heart: - Group verbal and written instruction and models provide basic cardiac anatomy and physiology, with the coronary electrical and arterial systems. Review of: AMI, Angina, Valve disease, Heart Failure, Cardiac Arrhythmia, Pacemakers, and the ICD.   Cardiac Procedures: - Group verbal and written instruction and models to describe the testing methods done to diagnose heart disease. Reviews the outcomes of the test results. Describes  the treatment choices: Medical Management, Angioplasty, or Coronary Bypass Surgery.          Cardiac Rehab from 04/27/2015 in [facility] Cardiac and Pulmonary Rehab   Date  04/27/15   Educator  DW   Instruction Review Code  2- meets goals/outcomes      Cardiac Medications: - Group verbal and written instruction to review commonly prescribed medications for heart disease. Reviews the medication, class of the drug, and side effects. Includes the steps to properly store meds and maintain the prescription regimen.   Go Sex-Intimacy & Heart Disease, Get SMART - Goal Setting: - Group verbal and written instruction through game format to discuss heart disease and the return to sexual intimacy. Provides group verbal and written material to discuss and apply goal setting through the application of the S.M.A.R.T. Method.      Cardiac Rehab from 04/27/2015 in [facility] Cardiac and Pulmonary Rehab   Date  04/27/15   Educator  DW   Instruction Review Code  2- meets goals/outcomes      Other Matters of the Heart: - Provides group verbal, written materials and models to  describe Heart Failure, Angina, Valve Disease, and Diabetes in the realm of heart disease. Includes description of the disease process and treatment options available to the cardiac patient.   Exercise & Equipment Safety: - Individual verbal instruction and demonstration of equipment use and safety with use of the equipment.      Cardiac Rehab from 04/27/2015 in [facility] Cardiac and Pulmonary Rehab   Date  04/05/15   Educator  SB   Instruction Review Code  2- meets goals/outcomes      Infection Prevention: - Provides verbal and written material to individual with discussion of infection control including proper hand washing and proper equipment cleaning during exercise session.      Cardiac Rehab from 04/27/2015 in [facility] Cardiac and Pulmonary Rehab   Date  04/05/15   Educator  SB   Instruction Review Code  2- meets goals/outcomes      Falls Prevention: - Provides verbal and written material to individual with discussion of falls prevention and safety.      Cardiac Rehab from 04/27/2015 in [facility] Cardiac and Pulmonary Rehab   Date  04/05/15   Educator  SB   Instruction Review Code  2- meets goals/outcomes      Diabetes: - Individual verbal and written instruction to review signs/symptoms of diabetes, desired ranges of glucose level fasting, after meals and with exercise. Advice that pre and post exercise glucose checks will be done for 3 sessions at entry of program.    Knowledge Questionnaire Score:     Knowledge Questionnaire Score - 04/05/15 1324    Knowledge Questionnaire Score   Pre Score 21/27      Personal Goals and Risk Factors at Admission:     Personal Goals and Risk Factors at Admission - 04/05/15 1333    Personal Goals and Risk Factors on Admission   Increase Aerobic Exercise and Physical Activity Sedentary  Recently had both hip replacements.  Has pain in the left knee. Today received injection in the knee. Wants to return to her ability to clean her house  the   way she was able to prior to her hip surgeries and the heart attack..   Intervention Provide exercise education and an individualized exercise prescription that will provide continued progressive overload as per policy without signs/symptoms of physical distress.   Hypertension Yes   Goal Participant will see  blood pressure controlled within the values of 140/68m/Hg or within value directed by their physician.   Intervention Provide nutrition & aerobic exercise along with prescribed medications to achieve BP 140/90 or less.   Lipids Yes   Goal Cholesterol controlled with medications as prescribed, with individualized exercise RX and with personalized nutrition plan. Value goals: LDL < 759m HDL > 4063mParticipant states understanding of desired cholesterol values and following prescriptions.   Intervention Provide nutrition & aerobic exercise along with prescribed medications to achieve LDL <55m20mDL >40mg24mStress Yes  Stress is present every day.  Valisa Clairees she can "deal with stress"  well.  Her husband has been diagnosed with dementia.    Goal To meet with psychosocial counselor for stress and relaxation information and guidance. To state understanding of performing relaxation techniques and or identifying personal stressors.   Intervention Provide education on types of stress, identifiying stressors, and ways to cope with stress. Provide demonstration and active practice of relaxation techniques.      Personal Goals and Risk Factors Review:      Goals and Risk Factor Review      05/04/15 1031           Increase Aerobic Exercise and Physical Activity   Goals Progress/Improvement seen  Yes       Comments Patient reports she has seen an increase in energy and activity seen starting the Cardiac Rehab program.  Patient stated she was able to cook and entire meal all by herself for Christmas Eve family meal.  Patient stated she would not have been able to do this before.          Hypertension   Goal Participant will see blood pressure controlled within the values of 140/90mm/20mr within value directed by their physician.  Patient's BP on arrival to Cardiac Rehab has ranged from 110/62 - 126/64.  BP at the end of sessions has ranged from 116/62 to 136/70.         Abnormal Lipids   Goal Cholesterol controlled with medications as prescribed, with individualized exercise RX and with personalized nutrition plan. Value goals: LDL < 55mg, 79m> 40mg. P28mcipant states understanding of desired cholesterol values and following prescriptions.  No new lab values available at this time.            Personal Goals Discharge (Final Personal Goals and Risk Factors Review):      Goals and Risk Factor Review - 05/04/15 1031    Increase Aerobic Exercise and Physical Activity   Goals Progress/Improvement seen  Yes   Comments Patient reports she has seen an increase in energy and activity seen starting the Cardiac Rehab program.  Patient stated she was able to cook and entire meal all by herself for Christmas Eve family meal.  Patient stated she would not have been able to do this before.     Hypertension   Goal Participant will see blood pressure controlled within the values of 140/90mm/Hg 47mithin value directed by their physician.  Patient's BP on arrival to Cardiac Rehab has ranged from 110/62 - 126/64.  BP at the end of sessions has ranged from 116/62 to 136/70.     Abnormal Lipids   Goal Cholesterol controlled with medications as prescribed, with individualized exercise RX and with personalized nutrition plan. Value goals: LDL < 55mg, HDL25m0mg. Part59mant states understanding of desired cholesterol values and following prescriptions.  No new lab values available at this time.  ITP Comments:     ITP Comments      04/05/15 1326 04/05/15 1328 04/23/15 1535 05/09/15 1309 05/18/15 1359   ITP Comments Initial ITP created today for review and sign off by Dr Loleta Chance.  Unable to walk today Had injection in the left knee.   Deiondra completed her functional test evaluation today.  She plans to start the sessions next week. Ready for 30 day review.  Continue with ITP  New to program ,has attended 3 sessions. Spoke with Romie Minus today. She had to have another stent and will be out until released to return to the program.     05/26/15 0715 06/03/15 1255         ITP Comments Patient has not attended since the date of last review.  No changes are warranted at this time. Ready for 30 day review. Continue with ITP. HaS been absent since 12/27 visit.  Another stent placement and then got RSV.          Comments:

## 2015-06-09 NOTE — Addendum Note (Signed)
Addended by: Lynford Humphrey on: 06/09/2015 07:32 AM   Modules accepted: Orders

## 2015-06-10 ENCOUNTER — Encounter: Payer: Medicare Other | Attending: Internal Medicine

## 2015-06-10 DIAGNOSIS — Z9861 Coronary angioplasty status: Secondary | ICD-10-CM | POA: Insufficient documentation

## 2015-06-10 DIAGNOSIS — I2102 ST elevation (STEMI) myocardial infarction involving left anterior descending coronary artery: Secondary | ICD-10-CM | POA: Insufficient documentation

## 2015-06-24 ENCOUNTER — Telehealth: Payer: Self-pay | Admitting: *Deleted

## 2015-06-24 NOTE — Telephone Encounter (Signed)
Called to check on status to return to program.  Valyncia will return next Tuesday  2/21

## 2015-06-26 ENCOUNTER — Emergency Department
Admission: EM | Admit: 2015-06-26 | Discharge: 2015-06-26 | Disposition: A | Discharge status: Home / Self Care | Payer: Medicare Other | Attending: Emergency Medicine | Admitting: Emergency Medicine

## 2015-06-26 ENCOUNTER — Encounter: Payer: Self-pay | Admitting: Emergency Medicine

## 2015-06-26 ENCOUNTER — Emergency Department: Payer: Medicare Other

## 2015-06-26 DIAGNOSIS — M1712 Unilateral primary osteoarthritis, left knee: Secondary | ICD-10-CM

## 2015-06-26 DIAGNOSIS — I129 Hypertensive chronic kidney disease with stage 1 through stage 4 chronic kidney disease, or unspecified chronic kidney disease: Secondary | ICD-10-CM | POA: Diagnosis not present

## 2015-06-26 DIAGNOSIS — M25462 Effusion, left knee: Secondary | ICD-10-CM | POA: Diagnosis not present

## 2015-06-26 DIAGNOSIS — Z79899 Other long term (current) drug therapy: Secondary | ICD-10-CM | POA: Insufficient documentation

## 2015-06-26 DIAGNOSIS — R2242 Localized swelling, mass and lump, left lower limb: Secondary | ICD-10-CM | POA: Diagnosis present

## 2015-06-26 DIAGNOSIS — Z7982 Long term (current) use of aspirin: Secondary | ICD-10-CM | POA: Insufficient documentation

## 2015-06-26 DIAGNOSIS — N189 Chronic kidney disease, unspecified: Secondary | ICD-10-CM | POA: Diagnosis not present

## 2015-06-26 NOTE — Discharge Instructions (Signed)
Arthritis Arthritis is a term that is commonly used to refer to joint pain or joint disease. There are more than 100 types of arthritis. CAUSES The most common cause of this condition is wear and tear of a joint. Other causes include:  Gout.  Inflammation of a joint.  An infection of a joint.  Sprains and other injuries near the joint.  A drug reaction or allergic reaction. In some cases, the cause may not be known. SYMPTOMS The main symptom of this condition is pain in the joint with movement. Other symptoms include:  Redness, swelling, or stiffness at a joint.  Warmth coming from the joint.  Fever.  Overall feeling of illness. DIAGNOSIS This condition may be diagnosed with a physical exam and tests, including:  Blood tests.  Urine tests.  Imaging tests, such as MRI, X-rays, or a CT scan. Sometimes, fluid is removed from a joint for testing. TREATMENT Treatment for this condition may involve:  Treatment of the cause, if it is known.  Rest.  Raising (elevating) the joint.  Applying cold or hot packs to the joint.  Medicines to improve symptoms and reduce inflammation.  Injections of a steroid such as cortisone into the joint to help reduce pain and inflammation. Depending on the cause of your arthritis, you may need to make lifestyle changes to reduce stress on your joint. These changes may include exercising more and losing weight. HOME CARE INSTRUCTIONS Medicines  Take over-the-counter and prescription medicines only as told by your health care provider.  Do not take aspirin to relieve pain if gout is suspected. Activities  Rest your joint if told by your health care provider. Rest is important when your disease is active and your joint feels painful, swollen, or stiff.  Avoid activities that make the pain worse. It is important to balance activity with rest.  Exercise your joint regularly with range-of-motion exercises as told by your health care  provider. Try doing low-impact exercise, such as:  Swimming.  Water aerobics.  Biking.  Walking. Joint Care  If your joint is swollen, keep it elevated if told by your health care provider.  If your joint feels stiff in the morning, try taking a warm shower.  If directed, apply heat to the joint. If you have diabetes, do not apply heat without permission from your health care provider.  Put a towel between the joint and the hot pack or heating pad.  Leave the heat on the area for 20-30 minutes.  If directed, apply ice to the joint:  Put ice in a plastic bag.  Place a towel between your skin and the bag.  Leave the ice on for 20 minutes, 2-3 times per day.  Keep all follow-up visits as told by your health care provider. This is important. SEEK MEDICAL CARE IF:  The pain gets worse.  You have a fever. SEEK IMMEDIATE MEDICAL CARE IF:  You develop severe joint pain, swelling, or redness.  Many joints become painful and swollen.  You develop severe back pain.  You develop severe weakness in your leg.  You cannot control your bladder or bowels.   This information is not intended to replace advice given to you by your health care provider. Make sure you discuss any questions you have with your health care provider.   Document Released: 06/01/2004 Document Revised: 01/13/2015 Document Reviewed: 07/20/2014 Elsevier Interactive Patient Education 2016 [place] therapy can help ease sore, stiff, injured, and tight muscles and  joints. Heat relaxes your muscles, which may help ease your pain.  RISKS AND COMPLICATIONS If you have any of the following conditions, do not use heat therapy unless your health care provider has approved:  Poor circulation.  Healing wounds or scarred skin in the area being treated.  Diabetes, heart disease, or high blood pressure.  Not being able to feel (numbness) the area being treated.  Unusual swelling of the  area being treated.  Active infections.  Blood clots.  Cancer.  Inability to communicate pain. This may include young children and people who have problems with their brain function (dementia).  Pregnancy. Heat therapy should only be used on old, pre-existing, or long-lasting (chronic) injuries. Do not use heat therapy on new injuries unless directed by your health care provider. HOW TO USE HEAT THERAPY There are several different kinds of heat therapy, including:  Moist heat pack.  Warm water bath.  Hot water bottle.  Electric heating pad.  Heated gel pack.  Heated wrap.  Electric heating pad. Use the heat therapy method suggested by your health care provider. Follow your health care provider's instructions on when and how to use heat therapy. GENERAL HEAT THERAPY RECOMMENDATIONS  Do not sleep while using heat therapy. Only use heat therapy while you are awake.  Your skin may turn pink while using heat therapy. Do not use heat therapy if your skin turns red.  Do not use heat therapy if you have new pain.  High heat or long exposure to heat can cause burns. Be careful when using heat therapy to avoid burning your skin.  Do not use heat therapy on areas of your skin that are already irritated, such as with a rash or sunburn. SEEK MEDICAL CARE IF:  You have blisters, redness, swelling, or numbness.  You have new pain.  Your pain is worse. MAKE SURE YOU:  Understand these instructions.  Will watch your condition.  Will get help right away if you are not doing well or get worse.   This information is not intended to replace advice given to you by your health care provider. Make sure you discuss any questions you have with your health care provider.   Document Released: 07/17/2011 Document Revised: 05/15/2014 Document Reviewed: 06/17/2013 Elsevier Interactive Patient Education 2016 [employer]  Knee Effusion Knee effusion means that you have excess fluid in  your knee joint. This can cause pain and swelling in your knee. This may make your knee more difficult to bend and move. That is because there is increased pain and pressure in the joint. If there is fluid in your knee, it often means that something is wrong inside your knee, such as severe arthritis, abnormal inflammation, or an infection. Another common cause of knee effusion is an injury to the knee muscles, ligaments, or cartilage. HOME CARE INSTRUCTIONS  Use crutches as directed by your health care provider.  Wear a knee brace as directed by your health care provider.  Apply ice to the swollen area:  Put ice in a plastic bag.  Place a towel between your skin and the bag.  Leave the ice on for 20 minutes, 2-3 times per day.  Keep your knee raised (elevated) when you are sitting or lying down.  Take medicines only as directed by your health care provider.  Do any rehabilitation or strengthening exercises as directed by your health care provider.  Rest your knee as directed by your health care provider. You may start doing your normal  activities again when your health care provider approves.   Keep all follow-up visits as directed by your health care provider. This is important. SEEK MEDICAL CARE IF:  You have ongoing (persistent) pain in your knee. SEEK IMMEDIATE MEDICAL CARE IF:  You have increased swelling or redness of your knee.  You have severe pain in your knee.  You have a fever.   This information is not intended to replace advice given to you by your health care provider. Make sure you discuss any questions you have with your health care provider.   Document Released: 07/15/2003 Document Revised: 05/15/2014 Document Reviewed: 12/08/2013 Elsevier Interactive Patient Education [employer].

## 2015-06-26 NOTE — ED Notes (Signed)
MD Stafford at bedside. 

## 2015-06-26 NOTE — ED Notes (Signed)
Discussed discharge instructions and follow-up care with patient. No questions or concerns at this time. Pt stable at discharge.  

## 2015-06-26 NOTE — ED Notes (Signed)
Reports swelling in left calf

## 2015-06-26 NOTE — ED Provider Notes (Signed)
[facility] Emergency Department Provider Note  ____________________________________________  Time seen: 2:35 PM  I have reviewed the triage vital signs and the nursing notes.   HISTORY  Chief Complaint Leg Swelling    HPI Monique Burns is a 82 y.o. female who complains of pain in the left knee and left calf. She also felt like the left leg in the calf area was swelling, which has now resolved. This is been going on over the past week, gradual onset and slightly worsening. She has a history of arthritis in the left knee and had previously received intra-articular injections by orthopedics. This was last done in November or December, and provided relief for about 3 months. She expected that around this time that would begin to wane and her symptoms would return. This feels very similar to that. No new trauma. No fevers chills nausea vomiting. She also has history of heart attack and stent reocclusion in December and had a heart catheterization. She has been compliant with all her medications including Imdur and antiplatelet therapy. Denies any chest pain or shortness of breath. No history of DVT or PE.     Past Medical History  Diagnosis Date  . Hypertension   . Renal disorder   . Arthritis   . Thyroid disease   . Hyperlipidemia   . Leukopenia   . MI (myocardial infarction) [facility] [address])      Patient Active Problem List   Diagnosis Date Noted  . Chronic kidney disease 04/05/2015  . HLD (hyperlipidemia) 04/05/2015  . BP (high blood pressure) 04/05/2015  . Decreased leukocytes 04/05/2015  . Arthritis, degenerative 04/05/2015  . Disease of thyroid gland 04/05/2015  . Acute myocardial infarction of anterior wall ([place]) 02/28/2015  . Degenerative arthritis of hip 10/31/2013     Past Surgical History  Procedure Laterality Date  . Coronary angioplasty with stent placement       Current Outpatient Rx  Name  Route  Sig  Dispense  Refill  . aspirin 81 MG  chewable tablet   Oral   Chew by mouth.         Marland Kitchen atorvastatin (LIPITOR) 80 MG tablet   Oral   Take 80 mg by mouth daily at 6 PM.          . B Complex-Biotin-FA (BALANCED B-100 PO)   Oral   Take 1 tablet by mouth daily.         . calcium-vitamin D (OSCAL WITH D) 500-200 MG-UNIT tablet   Oral   Take 1 tablet by mouth daily.         . Cholecalciferol (VITAMIN D3) 1000 UNITS CAPS   Oral   Take 1 tablet by mouth daily.         . cycloSPORINE (RESTASIS) 0.05 % ophthalmic emulsion   Both Eyes   Place 1 drop into both eyes 2 (two) times daily.          . DULoxetine (CYMBALTA) 30 MG capsule   Oral   Take 30 mg by mouth daily.      2   . isosorbide mononitrate (IMDUR) 60 MG 24 hr tablet   Oral   Take 60 mg by mouth. Taking half a tablet per md instructions         . metoprolol succinate (TOPROL-XL) 25 MG 24 hr tablet   Oral   Take 0.5 tablets by mouth daily.          . nitroGLYCERIN (NITROSTAT) 0.4 MG SL tablet  Sublingual   Place under the tongue.         . polyethylene glycol powder (GLYCOLAX/MIRALAX) powder   Oral   Take by mouth.         . ranitidine (ZANTAC) 150 MG capsule   Oral   Take 1 capsule by mouth 2 (two) times daily.            Allergies Review of patient's allergies indicates no known allergies.   Family History  Problem Relation Age of Onset  . Breast cancer Paternal Grandmother     Social History Social History  Substance Use Topics  . Smoking status: Never Smoker   . Smokeless tobacco: None  . Alcohol Use: No    Review of Systems  Constitutional:   No fever or chills. No weight changes Eyes:   No blurry vision or double vision.  ENT:   No sore throat.  Cardiovascular:   No chest pain. Respiratory:   No dyspnea or cough. Gastrointestinal:   Negative for abdominal pain, vomiting and diarrhea.  No BRBPR or melena. Genitourinary:   Negative for dysuria or difficulty urinating. Musculoskeletal:   Left lower  extremity pain in the knee and calf as above. No back pain. Skin:   Negative for rash. Neurological:   Negative for headaches, focal weakness or numbness. Psychiatric:  No anxiety or depression.   Endocrine:  No changes in energy or sleep difficulty.  10-point ROS otherwise negative.  ____________________________________________   PHYSICAL EXAM:  VITAL SIGNS: ED Triage Vitals  Enc Vitals Group     BP 06/26/15 1433 183/73 mmHg     Pulse Rate 06/26/15 1433 59     Resp 06/26/15 1433 18     Temp --      Temp src --      SpO2 06/26/15 1433 100 %     Weight --      Height --      Head Cir --      Peak Flow --      Pain Score 06/26/15 1059 5     Pain Loc --      Pain Edu? --      Excl. in [place]? --     Vital signs reviewed, nursing assessments reviewed.   Constitutional:   Alert and oriented. Well appearing and in no distress. Eyes:   No scleral icterus. No conjunctival pallor. PERRL. EOMI ENT   Head:   Normocephalic and atraumatic.   Nose:   No congestion/rhinnorhea. No septal hematoma   Mouth/Throat:   MMM, no pharyngeal erythema. No peritonsillar mass.    Neck:   No stridor. No SubQ emphysema. No meningismus. Hematological/Lymphatic/Immunilogical:   No cervical lymphadenopathy. Cardiovascular:   RRR. Symmetric bilateral radial and DP pulses.  No murmurs.  Respiratory:   Normal respiratory effort without tachypnea nor retractions. Breath sounds are clear and equal bilaterally. No wheezes/rales/rhonchi. Gastrointestinal:   Soft and nontender. Non distended. There is no CVA tenderness.  No rebound, rigidity, or guarding. Genitourinary:   deferred Musculoskeletal:   Nontender with normal range of motion in all extremities. Infrapatellar effusion of the left knee. No popliteal tenderness. There is some tenderness in the proximal posterior calf without palpable cord. Negative Homans sign.  No edema. Neurologic:   Normal speech and language.  CN 2-10 normal. Motor  grossly intact. No gross focal neurologic deficits are appreciated.  Skin:    Skin is warm, dry and intact. No rash noted.  No petechiae, purpura, or bullae.  ____________________________________________    LABS (pertinent positives/negatives) (all labs ordered are listed, but only abnormal results are displayed) Labs Reviewed - No data to display ____________________________________________   EKG    ____________________________________________    RADIOLOGY  Ultrasound left lower extremity unremarkable  ____________________________________________   PROCEDURES   ____________________________________________   INITIAL IMPRESSION / ASSESSMENT AND PLAN / ED COURSE  Pertinent labs & imaging results that were available during my care of the patient were reviewed by me and considered in my medical decision making (see chart for details).  Patient presents with swelling and pain of the left lower leg. Ultrasound is negative. The patient is very thin and this should be very reliable without any technical limitations. Low suspicion for DVT, soft tissue infection, septic arthritis. This appears to be an exacerbation of her chronic arthritis as evidenced by the extensive history and the knee joint effusion. Does not warrant arthrocentesis at this point or antibiotics. We'll have her follow-up with orthopedics and she states she can see them in 2 days on Monday. She'll continue her usual home meds, counseled on heat therapy and Ace wrap and Tylenol.     ____________________________________________   FINAL CLINICAL IMPRESSION(S) / ED DIAGNOSES  Final diagnoses:  Knee joint effusion, left  Arthritis of left knee      Carrie Mew, MD 06/26/15 1456

## 2015-06-29 NOTE — Progress Notes (Signed)
Cardiac Individual Treatment Plan  Patient Details  Name: Monique Burns MRN: [id number] Date of Birth: Aug 26, 1930 Referring Provider:  Yolonda Kida, MD  Initial Encounter Date:    Visit Diagnosis: ST elevation (STEMI) myocardial infarction involving left anterior descending coronary artery ([place]) - Plan: CARDIAC REHAB 30 DAY REVIEW, CARDIAC REHAB 3 DAY REVIEW  S/P PTCA (percutaneous transluminal coronary angioplasty) - Plan: CARDIAC REHAB 30 DAY REVIEW, CARDIAC REHAB 39 DAY REVIEW  Patient's Home Medications on Admission:  Current outpatient prescriptions:  .  aspirin 81 MG chewable tablet, Chew by mouth., Disp: , Rfl:  .  atorvastatin (LIPITOR) 80 MG tablet, Take 80 mg by mouth daily at 6 PM. , Disp: , Rfl:  .  B Complex-Biotin-FA (BALANCED B-100 PO), Take 1 tablet by mouth daily., Disp: , Rfl:  .  calcium-vitamin D (OSCAL WITH D) 500-200 MG-UNIT tablet, Take 1 tablet by mouth daily., Disp: , Rfl:  .  Cholecalciferol (VITAMIN D3) 1000 UNITS CAPS, Take 1 tablet by mouth daily., Disp: , Rfl:  .  cycloSPORINE (RESTASIS) 0.05 % ophthalmic emulsion, Place 1 drop into both eyes 2 (two) times daily. , Disp: , Rfl:  .  DULoxetine (CYMBALTA) 30 MG capsule, Take 30 mg by mouth daily., Disp: , Rfl: 2 .  isosorbide mononitrate (IMDUR) 60 MG 24 hr tablet, Take 60 mg by mouth. Taking half a tablet per md instructions, Disp: , Rfl:  .  metoprolol succinate (TOPROL-XL) 25 MG 24 hr tablet, Take 0.5 tablets by mouth daily. , Disp: , Rfl:  .  nitroGLYCERIN (NITROSTAT) 0.4 MG SL tablet, Place under the tongue., Disp: , Rfl:  .  polyethylene glycol powder (GLYCOLAX/MIRALAX) powder, Take by mouth., Disp: , Rfl:  .  ranitidine (ZANTAC) 150 MG capsule, Take 1 capsule by mouth 2 (two) times daily., Disp: , Rfl:   Past Medical History: Past Medical History  Diagnosis Date  . Hypertension   . Renal disorder   . Arthritis   . Thyroid disease   . Hyperlipidemia   . Leukopenia   . MI (myocardial  infarction) ([place])     Tobacco Use: History  Smoking status  . Never Smoker   Smokeless tobacco  . Not on file    Labs: Recent Review Flowsheet Data    There is no flowsheet data to display.       Exercise Target Goals:    Exercise Program Goal: Individual exercise prescription set with THRR, safety & activity barriers. Participant demonstrates ability to understand and report RPE using BORG scale, to self-measure pulse accurately, and to acknowledge the importance of the exercise prescription.  Exercise Prescription Goal: Starting with aerobic activity 30 plus minutes a day, 3 days per week for initial exercise prescription. Provide home exercise prescription and guidelines that participant acknowledges understanding prior to discharge.  Activity Barriers & Risk Stratification:     Activity Barriers & Cardiac Risk Stratification - 04/05/15 1323    Activity Barriers & Cardiac Risk Stratification   Activity Barriers Arthritis;Right Hip Replacement;Left Hip Replacement;Deconditioning;Joint Problems   Cardiac Risk Stratification High      6 Minute Walk:     6 Minute Walk      04/23/15 1303       6 Minute Walk   Phase Initial     Distance 815 feet     Walk Time 6 minutes     RPE 11     Symptoms Yes (comment)     Comments slight dizziness and unsteadiness  Resting HR 77 bpm     Resting BP 126/64 mmHg     Max Ex. HR 102 bpm     Max Ex. BP 140/58 mmHg        Initial Exercise Prescription:     Initial Exercise Prescription - 04/23/15 1300    Date of Initial Exercise Prescription   Date 04/23/15   Treadmill   MPH 1.2   Grade 0   Minutes 10   Bike   Level 0.2   Watts 15   Minutes 10   Recumbant Bike   Level 2   RPM 40   Watts 15   Minutes 15   NuStep   Level 2   Watts 25   Minutes 15   Arm Ergometer   Level 1   Watts 8   Minutes 10   Arm/Foot Ergometer   Level 4   Watts 12   Minutes 10   Cybex   Level 1   RPM 50   Minutes 10    Recumbant Elliptical   Level 1   RPM 40   Watts 10   Minutes 10   REL-XR   Level 2   Watts 30   Minutes 15   T5 Nustep   Level 1   Watts 12   Minutes 15   Biostep-RELP   Level 2   Watts 15   Minutes 15   Prescription Details   Frequency (times per week) 3   Duration Progress to 30 minutes of continuous aerobic without signs/symptoms of physical distress   Intensity   THRR REST +  30   Ratings of Perceived Exertion 11-15   Progression Continue progressive overload as per policy without signs/symptoms or physical distress.   Resistance Training   Training Prescription Yes   Weight 2   Reps 10-15      Exercise Prescription Changes:     Exercise Prescription Changes      04/27/15 0900 05/04/15 1503 05/26/15 0700 06/29/15 0700     Exercise Review   Progression No   No  Absent since 05/04/15    Response to Exercise   Blood Pressure (Admit)  118/62 mmHg      Blood Pressure (Exercise)  124/62 mmHg      Blood Pressure (Exit)  136/70 mmHg      Heart Rate (Admit)  76 bpm      Heart Rate (Exercise)  99 bpm      Heart Rate (Exit)  75 bpm      Rating of Perceived Exertion (Exercise)  13      Symptoms  none      Comments First day of exercises! Patient was oriented to the equipment and exercise prescription was discussed. Patient was able to complete exercise today without signs or symptoms.   Patient as not attended since the date of the last 30 day review. Patient absent since 05/03/16, workloads will be re-evaluated upon return based on fitness and function    Duration  Progress to 30 minutes of continuous aerobic without signs/symptoms of physical distress  Progress to 30 minutes of continuous aerobic without signs/symptoms of physical distress    Intensity  THRR unchanged  THRR unchanged    Progression  Continue progressive overload as per policy without signs/symptoms or physical distress.  Continue progressive overload as per policy without signs/symptoms or physical  distress.    Resistance Training   Training Prescription  Yes  Yes    Weight  2  2    Reps  10-15  10-15    Interval Training   Interval Training  No  No    Treadmill   MPH  1  1    Grade  0  0    Minutes  15  15    NuStep   Level  4  4    Watts  20  20    Minutes  15  15       Discharge Exercise Prescription (Final Exercise Prescription Changes):     Exercise Prescription Changes - 06/29/15 0700    Exercise Review   Progression No  Absent since 05/04/15   Response to Exercise   Comments Patient absent since 05/03/16, workloads will be re-evaluated upon return based on fitness and function   Duration Progress to 30 minutes of continuous aerobic without signs/symptoms of physical distress   Intensity THRR unchanged   Progression Continue progressive overload as per policy without signs/symptoms or physical distress.   Resistance Training   Training Prescription Yes   Weight 2   Reps 10-15   Interval Training   Interval Training No   Treadmill   MPH 1   Grade 0   Minutes 15   NuStep   Level 4   Watts 20   Minutes 15      Nutrition:  Target Goals: Understanding of nutrition guidelines, daily intake of sodium <1558m, cholesterol <2054m calories 30% from fat and 7% or less from saturated fats, daily to have 5 or more servings of fruits and vegetables.  Biometrics:     Pre Biometrics - 04/23/15 1301    Pre Biometrics   Height _0  (1.727 m)   Weight 120 lb 14.4 oz (54.84 kg)   Waist Circumference 27 inches   Hip Circumference 35.25 inches   Waist to Hip Ratio 0.77 %   BMI (Calculated) 18.4       Nutrition Therapy Plan and Nutrition Goals:   Nutrition Discharge: Rate Your Plate Scores:     Rate Your Plate - 1202/54/2790623  Rate Your Plate Scores   Pre Score 72   Pre Score % 80 %      Nutrition Goals Re-Evaluation:   Psychosocial: Target Goals: Acknowledge presence or absence of depression, maximize coping skills, provide positive support  system. Participant is able to verbalize types and ability to use techniques and skills needed for reducing stress and depression.  Initial Review & Psychosocial Screening:     Initial Psych Review & Screening - 04/05/15 1341    Initial Review   Current issues with Current Stress Concerns   Source of Stress Concerns Chronic Illness;Unable to perform yard/household activities   Comments Bradyn's husband has been diagnosed with dementia. She deals with this daily.    Family Dynamics   Good Support System? Yes   Comments JeWylieas good family support.  Children , nieces and nephews   Barriers   Psychosocial barriers to participate in program There are no identifiable barriers or psychosocial needs.;The patient should benefit from training in stress management and relaxation.   Screening Interventions   Interventions Encouraged to exercise      Quality of Life Scores:   PHQ-9:     Recent Review Flowsheet Data    [facility]   Decreased Interest 1   Down, Depressed, Hopeless 0   PHQ - 2 Score 1   Altered sleeping 1   Tired, decreased energy 2  Change in appetite 2   Feeling bad or failure about yourself  0   Trouble concentrating 0   Moving slowly or fidgety/restless 0   Suicidal thoughts 0   PHQ-9 Score 6   Difficult doing work/chores Not difficult at all      Psychosocial Evaluation and Intervention:     Psychosocial Evaluation - 05/04/15 0939    Psychosocial Evaluation & Interventions   Interventions Encouraged to exercise with the program and follow exercise prescription;Stress management education   Comments Counselor met with Ms. Karr today for initial psychosocial evaluattion.  She is an 82 year old who had a heart attack the last week of October.  She has a strong support system with a spouse of 30 years and several daughters/daughter in laws who live close by.  She is also actively involved in her local church community.  Ms. Darnell Level has other  health issues that are stressful for her in addition to her heart problems, she also has severe arthritis and had both hips replaced a year ago.  She has suffered multiple deaths in her life with a son and spouse and (3) brothers, but she denies a history of depression or anxiety or any current symptoms.  Ms. Darnell Level reports relying on her faith to help her through this.  She has goals for this program to increase her strength and stamina so she will be able to "do normal stuff like clean the house, etc, and be able to walk outside with the neighbors."  Ms. G will benefit from stress management education as she has a spouse who has dementia and this is stressful for her to care for him at this time.     Continued Psychosocial Services Needed Yes  Ms. G will benefit from stress management education to help her cope with her current stressors.      Psychosocial Re-Evaluation:   Vocational Rehabilitation: Provide vocational rehab assistance to qualifying candidates.   Vocational Rehab Evaluation & Intervention:     Vocational Rehab - 04/05/15 1324    Initial Vocational Rehab Evaluation & Intervention   Assessment shows need for Vocational Rehabilitation No      Education: Education Goals: Education classes will be provided on a weekly basis, covering required topics. Participant will state understanding/return demonstration of topics presented.  Learning Barriers/Preferences:     Learning Barriers/Preferences - 04/05/15 1323    Learning Barriers/Preferences   Learning Barriers None   Learning Preferences None      Education Topics: General Nutrition Guidelines/Fats and Fiber: -Group instruction provided by verbal, written material, models and posters to present the general guidelines for heart healthy nutrition. Gives an explanation and review of dietary fats and fiber.   Controlling Sodium/Reading Food Labels: -Group verbal and written material supporting the discussion of sodium use  in heart healthy nutrition. Review and explanation with models, verbal and written materials for utilization of the food label.   Exercise Physiology & Risk Factors: - Group verbal and written instruction with models to review the exercise physiology of the cardiovascular system and associated critical values. Details cardiovascular disease risk factors and the goals associated with each risk factor.   Aerobic Exercise & Resistance Training: - Gives group verbal and written discussion on the health impact of inactivity. On the components of aerobic and resistive training programs and the benefits of this training and how to safely progress through these programs.   Flexibility, Balance, General Exercise Guidelines: - Provides group verbal and written instruction on the  benefits of flexibility and balance training programs. Provides general exercise guidelines with specific guidelines to those with heart or lung disease. Demonstration and skill practice provided.   Stress Management: - Provides group verbal and written instruction about the health risks of elevated stress, cause of high stress, and healthy ways to reduce stress.   Depression: - Provides group verbal and written instruction on the correlation between heart/lung disease and depressed mood, treatment options, and the stigmas associated with seeking treatment.   Anatomy & Physiology of the Heart: - Group verbal and written instruction and models provide basic cardiac anatomy and physiology, with the coronary electrical and arterial systems. Review of: AMI, Angina, Valve disease, Heart Failure, Cardiac Arrhythmia, Pacemakers, and the ICD.   Cardiac Procedures: - Group verbal and written instruction and models to describe the testing methods done to diagnose heart disease. Reviews the outcomes of the test results. Describes the treatment choices: Medical Management, Angioplasty, or Coronary Bypass Surgery.          Cardiac  Rehab from 04/27/2015 in [facility] Cardiac and Pulmonary Rehab   Date  04/27/15   Educator  DW   Instruction Review Code  2- meets goals/outcomes      Cardiac Medications: - Group verbal and written instruction to review commonly prescribed medications for heart disease. Reviews the medication, class of the drug, and side effects. Includes the steps to properly store meds and maintain the prescription regimen.   Go Sex-Intimacy & Heart Disease, Get SMART - Goal Setting: - Group verbal and written instruction through game format to discuss heart disease and the return to sexual intimacy. Provides group verbal and written material to discuss and apply goal setting through the application of the S.M.A.R.T. Method.      Cardiac Rehab from 04/27/2015 in [facility] Cardiac and Pulmonary Rehab   Date  04/27/15   Educator  DW   Instruction Review Code  2- meets goals/outcomes      Other Matters of the Heart: - Provides group verbal, written materials and models to describe Heart Failure, Angina, Valve Disease, and Diabetes in the realm of heart disease. Includes description of the disease process and treatment options available to the cardiac patient.   Exercise & Equipment Safety: - Individual verbal instruction and demonstration of equipment use and safety with use of the equipment.      Cardiac Rehab from 04/27/2015 in [facility] Cardiac and Pulmonary Rehab   Date  04/05/15   Educator  SB   Instruction Review Code  2- meets goals/outcomes      Infection Prevention: - Provides verbal and written material to individual with discussion of infection control including proper hand washing and proper equipment cleaning during exercise session.      Cardiac Rehab from 04/27/2015 in [facility] Cardiac and Pulmonary Rehab   Date  04/05/15   Educator  SB   Instruction Review Code  2- meets goals/outcomes      Falls Prevention: - Provides verbal and written material to individual with discussion of falls  prevention and safety.      Cardiac Rehab from 04/27/2015 in [facility] Cardiac and Pulmonary Rehab   Date  04/05/15   Educator  SB   Instruction Review Code  2- meets goals/outcomes      Diabetes: - Individual verbal and written instruction to review signs/symptoms of diabetes, desired ranges of glucose level fasting, after meals and with exercise. Advice that pre and post exercise glucose checks will be done for 3 sessions at entry  of program.    Knowledge Questionnaire Score:     Knowledge Questionnaire Score - 04/05/15 1324    Knowledge Questionnaire Score   Pre Score 21/27      Personal Goals and Risk Factors at Admission:     Personal Goals and Risk Factors at Admission - 04/05/15 1333    Personal Goals and Risk Factors on Admission   Increase Aerobic Exercise and Physical Activity Sedentary  Recently had both hip replacements.  Has pain in the left knee. Today received injection in the knee. Wants to return to her ability to clean her house  the  way she was able to prior to her hip surgeries and the heart attack..   Intervention Provide exercise education and an individualized exercise prescription that will provide continued progressive overload as per policy without signs/symptoms of physical distress.   Hypertension Yes   Goal Participant will see blood pressure controlled within the values of 140/79m/Hg or within value directed by their physician.   Intervention Provide nutrition & aerobic exercise along with prescribed medications to achieve BP 140/90 or less.   Lipids Yes   Goal Cholesterol controlled with medications as prescribed, with individualized exercise RX and with personalized nutrition plan. Value goals: LDL < 729m HDL > 40110mParticipant states understanding of desired cholesterol values and following prescriptions.   Intervention Provide nutrition & aerobic exercise along with prescribed medications to achieve LDL <64m64mDL >40mg74mStress Yes  Stress is  present every day.  Juliannah Harvestes she can "deal with stress"  well.  Her husband has been diagnosed with dementia.    Goal To meet with psychosocial counselor for stress and relaxation information and guidance. To state understanding of performing relaxation techniques and or identifying personal stressors.   Intervention Provide education on types of stress, identifiying stressors, and ways to cope with stress. Provide demonstration and active practice of relaxation techniques.      Personal Goals and Risk Factors Review:      Goals and Risk Factor Review      05/04/15 1031           Increase Aerobic Exercise and Physical Activity   Goals Progress/Improvement seen  Yes       Comments Patient reports she has seen an increase in energy and activity seen starting the Cardiac Rehab program.  Patient stated she was able to cook and entire meal all by herself for Christmas Eve family meal.  Patient stated she would not have been able to do this before.         Hypertension   Goal Participant will see blood pressure controlled within the values of 140/90mm/104mr within value directed by their physician.  Patient's BP on arrival to Cardiac Rehab has ranged from 110/62 - 126/64.  BP at the end of sessions has ranged from 116/62 to 136/70.         Abnormal Lipids   Goal Cholesterol controlled with medications as prescribed, with individualized exercise RX and with personalized nutrition plan. Value goals: LDL < 64mg, 37m> 40mg. P4mcipant states understanding of desired cholesterol values and following prescriptions.  No new lab values available at this time.            Personal Goals Discharge (Final Personal Goals and Risk Factors Review):      Goals and Risk Factor Review - 05/04/15 1031    Increase Aerobic Exercise and Physical Activity   Goals Progress/Improvement seen  Yes   Comments  Patient reports she has seen an increase in energy and activity seen starting the Cardiac Rehab program.   Patient stated she was able to cook and entire meal all by herself for Christmas Eve family meal.  Patient stated she would not have been able to do this before.     Hypertension   Goal Participant will see blood pressure controlled within the values of 140/72m/Hg or within value directed by their physician.  Patient's BP on arrival to Cardiac Rehab has ranged from 110/62 - 126/64.  BP at the end of sessions has ranged from 116/62 to 136/70.     Abnormal Lipids   Goal Cholesterol controlled with medications as prescribed, with individualized exercise RX and with personalized nutrition plan. Value goals: LDL < 765m HDL > 4066mParticipant states understanding of desired cholesterol values and following prescriptions.  No new lab values available at this time.        ITP Comments:     ITP Comments      04/05/15 1326 04/05/15 1328 04/23/15 1535 05/09/15 1309 05/18/15 1359   ITP Comments Initial ITP created today for review and sign off by Dr M MLoleta Chancenable to walk today Had injection in the left knee.   JeaChicquitampleted her functional test evaluation today.  She plans to start the sessions next week. Ready for 30 day review.  Continue with ITP  New to program ,has attended 3 sessions. Spoke with JeaRomie Minusday. She had to have another stent and will be out until released to return to the program.     05/26/15 0715 06/03/15 1255 06/29/15 0852       ITP Comments Patient has not attended since the date of last review.  No changes are warranted at this time. Ready for 30 day review. Continue with ITP. HaS been absent since 12/27 visit.  Another stent placement and then got RSV.  30 Day Review.  Continue with ITP.  JeaDaizha ready to return to the program. She showed up today with a large bruise behind her left knee and calf.  She was sent home to rest the leg and return when the bruise is resolved and not hurting her.         Comments:

## 2015-07-01 DIAGNOSIS — Z9861 Coronary angioplasty status: Secondary | ICD-10-CM

## 2015-07-01 DIAGNOSIS — I2102 ST elevation (STEMI) myocardial infarction involving left anterior descending coronary artery: Secondary | ICD-10-CM | POA: Diagnosis present

## 2015-07-01 NOTE — Progress Notes (Signed)
Daily Session Note  Patient Details  Name: Monique Burns MRN: [id number] Date of Birth: 30-Jan-1931 Referring Provider:  Yolonda Kida, MD  Encounter Date: 07/01/2015  Check In:     Session Check In - 07/01/15 0902    Check-In   Location [facility]-Cardiac & Pulmonary Rehab   Staff Present Gerlene Burdock, RN, BSN;Levita Monical, BS, ACSM EP-C, Exercise Physiologist;Diane Joya Gaskins, RN, BSN   Supervising physician immediately available to respond to emergencies See telemetry face sheet for immediately available ER MD   Medication changes reported     No   Fall or balance concerns reported    No   Warm-up and Cool-down Performed on first and last piece of equipment   Resistance Training Performed No   VAD Patient? No   Pain Assessment   Currently in Pain? No/denies         Goals Met:  Proper associated with RPD/PD & O2 Sat Exercise tolerated well No report of cardiac concerns or symptoms Strength training completed today  Goals Unmet:  Not Applicable  Goals Comments:   Dr. Emily Filbert is Medical Director for [facility].

## 2015-07-06 ENCOUNTER — Encounter: Payer: Medicare Other | Admitting: *Deleted

## 2015-07-06 DIAGNOSIS — Z9861 Coronary angioplasty status: Secondary | ICD-10-CM

## 2015-07-06 DIAGNOSIS — I2102 ST elevation (STEMI) myocardial infarction involving left anterior descending coronary artery: Secondary | ICD-10-CM

## 2015-07-06 NOTE — Progress Notes (Signed)
Daily Session Note  Patient Details  Name: Monique Burns MRN: [id number] Date of Birth: 02-04-1931 Referring Provider:  Yolonda Kida, MD  Encounter Date: 07/06/2015  Check In:     Session Check In - 07/06/15 0923    Check-In   Location [facility]-Cardiac & Pulmonary Rehab   Staff Present Nyoka Cowden, RN;Rebecca Sickles, PT;Nori Winegar Joya Gaskins, RN, BSN   Supervising physician immediately available to respond to emergencies See telemetry face sheet for immediately available ER MD   Medication changes reported     No   Fall or balance concerns reported    No   Warm-up and Cool-down Performed on first and last piece of equipment   Resistance Training Performed Yes   VAD Patient? No   Pain Assessment   Currently in Pain? No/denies         Goals Met:  Exercise tolerated well No report of cardiac concerns or symptoms Strength training completed today  Goals Unmet:  Not Applicable  Comments:  Patient completed exercise prescription and all exercise goals during rehab session. The exercise was tolerated well and the patient is progressing in the program.    Dr. Emily Filbert is Medical Director for [facility].

## 2015-07-07 NOTE — Addendum Note (Signed)
Addended by: Lynford Humphrey on: 07/07/2015 07:43 AM   Modules accepted: Orders

## 2015-07-08 ENCOUNTER — Encounter: Payer: Medicare Other | Attending: Internal Medicine

## 2015-07-08 DIAGNOSIS — Z9861 Coronary angioplasty status: Secondary | ICD-10-CM | POA: Insufficient documentation

## 2015-07-08 DIAGNOSIS — I2102 ST elevation (STEMI) myocardial infarction involving left anterior descending coronary artery: Secondary | ICD-10-CM | POA: Diagnosis not present

## 2015-07-08 NOTE — Progress Notes (Signed)
Daily Session Note  Patient Details  Name: Monique Burns MRN: [id number] Date of Birth: May 18, 1930 Referring Provider:  Yolonda Kida, MD  Encounter Date: 07/08/2015  Check In:     Session Check In - 07/08/15 0853    Check-In   Location [facility]   Staff Present Gerlene Burdock, RN, BSN;Reni Hausner, BS, ACSM EP-C, Exercise Physiologist;Rebecca Brayton El, PT   Supervising physician immediately available to respond to emergencies See telemetry face sheet for immediately available ER MD   Medication changes reported     No   Fall or balance concerns reported    No   Warm-up and Cool-down Performed on first and last piece of equipment   Resistance Training Performed No   VAD Patient? No   Pain Assessment   Currently in Pain? No/denies         Goals Met:  Proper associated with RPD/PD & O2 Sat Exercise tolerated well No report of cardiac concerns or symptoms Strength training completed today  Goals Unmet:  Not Applicable  Comments:    Dr. Emily Filbert is Medical Director for [facility].

## 2015-07-13 ENCOUNTER — Other Ambulatory Visit: Payer: Self-pay | Admitting: Internal Medicine

## 2015-07-13 ENCOUNTER — Ambulatory Visit
Admission: RE | Admit: 2015-07-13 | Discharge: 2015-07-13 | Disposition: A | Discharge status: Home / Self Care | Payer: Medicare Other | Source: Ambulatory Visit | Attending: Internal Medicine | Admitting: Internal Medicine

## 2015-07-13 DIAGNOSIS — M79605 Pain in left leg: Secondary | ICD-10-CM

## 2015-07-13 DIAGNOSIS — M7122 Synovial cyst of popliteal space [Baker], left knee: Secondary | ICD-10-CM | POA: Diagnosis not present

## 2015-07-26 ENCOUNTER — Emergency Department
Admission: EM | Admit: 2015-07-26 | Discharge: 2015-07-27 | Disposition: A | Discharge status: Home / Self Care | Payer: Medicare Other | Attending: Emergency Medicine | Admitting: Emergency Medicine

## 2015-07-26 DIAGNOSIS — M79642 Pain in left hand: Secondary | ICD-10-CM | POA: Diagnosis present

## 2015-07-26 DIAGNOSIS — I10 Essential (primary) hypertension: Secondary | ICD-10-CM | POA: Diagnosis not present

## 2015-07-26 DIAGNOSIS — M7981 Nontraumatic hematoma of soft tissue: Secondary | ICD-10-CM | POA: Insufficient documentation

## 2015-07-26 DIAGNOSIS — Z7982 Long term (current) use of aspirin: Secondary | ICD-10-CM | POA: Insufficient documentation

## 2015-07-26 DIAGNOSIS — R233 Spontaneous ecchymoses: Secondary | ICD-10-CM

## 2015-07-26 DIAGNOSIS — Z79899 Other long term (current) drug therapy: Secondary | ICD-10-CM | POA: Insufficient documentation

## 2015-07-26 LAB — PROTIME-INR
INR: 1.03
Prothrombin Time: 13.7 seconds (ref 11.4–15.0)

## 2015-07-26 NOTE — ED Notes (Addendum)
Pt in with co acute onset of hand bruising and swelling to left hand.  Large amt of swelling noted to anterior aspect of hand. Pt has been on brilinta since October without complications.

## 2015-07-27 ENCOUNTER — Emergency Department: Payer: Medicare Other

## 2015-07-27 DIAGNOSIS — M7981 Nontraumatic hematoma of soft tissue: Secondary | ICD-10-CM | POA: Diagnosis not present

## 2015-07-27 LAB — CBC
HCT: 30.6 % — ABNORMAL LOW (ref 35.0–47.0)
Hemoglobin: 10.4 g/dL — ABNORMAL LOW (ref 12.0–16.0)
MCH: 29.2 pg (ref 26.0–34.0)
MCHC: 33.9 g/dL (ref 32.0–36.0)
MCV: 86 fL (ref 80.0–100.0)
PLATELETS: 382 10*3/uL (ref 150–440)
RBC: 3.56 MIL/uL — ABNORMAL LOW (ref 3.80–5.20)
RDW: 13.9 % (ref 11.5–14.5)
WBC: 5.9 10*3/uL (ref 3.6–11.0)

## 2015-07-27 NOTE — ED Provider Notes (Signed)
CSN: [id number]     Arrival date & time 07/26/15  2046 History   First MD Initiated Contact with Patient 07/26/15 2311     Chief Complaint  Patient presents with  . Hand Pain     (Consider location/radiation/quality/duration/timing/severity/associated sxs/prior Treatment) HPI  82 year old female presents to the emergency department for evaluation of acute onset of left hand swelling. Patient denies any trauma or injury but states she had a hematoma develop on the dorsal aspect of her left hand, began around 8 PM today. After noticing hematoma, patient came to the emergency department. Since arrival at the emergency department she has not noticed a increase in size of the hematoma. She is on Brilinta for recent myocardial infarction and cardiac catheterization with stent. She has tolerated the medication well without any complications. She denies taking any other NSAIDs. She denies any bleeding from her nose, gums, upper or lower extremities. She denies any chest pain or shortness of breath. Denies any recent falls or trauma. Patient's pain is moderate on the dorsal aspect of her left hand. She describes it as tightness. She continues to be able to move the digits. No numbness or tingling.  Past Medical History  Diagnosis Date  . Hypertension   . Renal disorder   . Arthritis   . Thyroid disease   . Hyperlipidemia   . Leukopenia   . MI (myocardial infarction) [facility])    Past Surgical History  Procedure Laterality Date  . Coronary angioplasty with stent placement     Family History  Problem Relation Age of Onset  . Breast cancer Paternal Grandmother    Social History  Substance Use Topics  . Smoking status: Never Smoker   . Smokeless tobacco: Not on file  . Alcohol Use: No   OB History    No data available     Review of Systems  Constitutional: Negative for fever, chills, activity change and fatigue.  HENT: Negative for congestion, sinus pressure and sore throat.   Eyes:  Negative for visual disturbance.  Respiratory: Negative for cough, chest tightness and shortness of breath.   Cardiovascular: Negative for chest pain and leg swelling.  Gastrointestinal: Negative for nausea, vomiting, abdominal pain and diarrhea.  Genitourinary: Negative for dysuria.  Musculoskeletal: Positive for joint swelling (dorsal left hand). Negative for arthralgias and gait problem.  Skin: Positive for wound (hematoma dorsum left hand). Negative for rash.  Neurological: Negative for weakness, numbness and headaches.  Hematological: Negative for adenopathy.  Psychiatric/Behavioral: Negative for behavioral problems, confusion and agitation.      Allergies  Review of patient's allergies indicates no known allergies.  Home Medications   Prior to Admission medications   Medication Sig Start Date End Date Taking? Authorizing Provider  aspirin 81 MG chewable tablet Chew by mouth. 03/03/15 03/02/16  Historical Provider, MD  atorvastatin (LIPITOR) 80 MG tablet Take 80 mg by mouth daily at 6 PM.  03/03/15 03/02/16  Historical Provider, MD  B Complex-Biotin-FA (BALANCED B-100 PO) Take 1 tablet by mouth daily.    Historical Provider, MD  calcium-vitamin D (OSCAL WITH D) 500-200 MG-UNIT tablet Take 1 tablet by mouth daily.    Historical Provider, MD  Cholecalciferol (VITAMIN D3) 1000 UNITS CAPS Take 1 tablet by mouth daily.    Historical Provider, MD  cycloSPORINE (RESTASIS) 0.05 % ophthalmic emulsion Place 1 drop into both eyes 2 (two) times daily.  11/17/13   Historical Provider, MD  DULoxetine (CYMBALTA) 30 MG capsule Take 30 mg by mouth daily. 01/13/15  Historical Provider, MD  isosorbide mononitrate (IMDUR) 60 MG 24 hr tablet Take 60 mg by mouth. Taking half a tablet per md instructions 03/15/15 03/14/16  Historical Provider, MD  metoprolol succinate (TOPROL-XL) 25 MG 24 hr tablet Take 0.5 tablets by mouth daily.  03/04/15 03/03/16  Historical Provider, MD  nitroGLYCERIN (NITROSTAT) 0.4 MG  SL tablet Place under the tongue. 03/03/15 03/02/16  Historical Provider, MD  polyethylene glycol powder (GLYCOLAX/MIRALAX) powder Take by mouth.    Historical Provider, MD  ranitidine (ZANTAC) 150 MG capsule Take 1 capsule by mouth 2 (two) times daily.    Historical Provider, MD   BP 163/71 mmHg  Pulse 79  Temp(Src) 98 F (36.7 C) (Oral)  Resp 18  Wt 52.164 kg  SpO2 98% Physical Exam  Constitutional: She is oriented to person, place, and time. She appears well-developed and well-nourished. No distress.  HENT:  Head: Normocephalic and atraumatic.  Mouth/Throat: Oropharynx is clear and moist.  Eyes: EOM are normal. Pupils are equal, round, and reactive to light. Right eye exhibits no discharge. Left eye exhibits no discharge.  Neck: Normal range of motion. Neck supple.  Cardiovascular: Normal rate, regular rhythm and intact distal pulses.   Pulmonary/Chest: Effort normal and breath sounds normal. No respiratory distress. She exhibits no tenderness.  Abdominal: Soft. She exhibits no distension. There is no tenderness.  Musculoskeletal:  Examination of the left hand shows the patient has a 4 x 4 centimeter area of ecchymosis with fluctuance consistent with hematoma. Patient is unable to make a full fist due to soft tissue swelling on the dorsal aspect of the left hand. She has 2+ cap refill throughout the digits. 2+ radial pulse present.  Neurological: She is alert and oriented to person, place, and time. She has normal reflexes. Coordination normal.  Skin: Skin is warm and dry.  Psychiatric: She has a normal mood and affect. Her behavior is normal. Thought content normal.  Nursing note and vitals reviewed.   ED Course  Procedures (including critical care time) Ace wrap applied to the left hand and wrist.  Labs Review Labs Reviewed  CBC - Abnormal; Notable for the following:    RBC 3.56 (*)    Hemoglobin 10.4 (*)    HCT 30.6 (*)    All other components within normal limits   PROTIME-INR    Imaging Review Dg Hand Complete Left  07/27/2015  CLINICAL DATA:  Nontraumatic pain and swelling for 4 hours. EXAM: LEFT HAND - COMPLETE 3+ VIEW COMPARISON:  None. FINDINGS: Negative for fracture, dislocation or radiopaque foreign body. Moderate arthritic changes are present at the radial aspect of the wrist and at the first carpometacarpal articulation. Mild degenerative narrowing of the interphalangeal joint spaces. No bone lesion or bony destruction. There is marked soft tissue swelling at the dorsum of the hand. No soft tissue gas. No radiopaque foreign body. IMPRESSION: Dorsal soft tissue swelling without radiopaque foreign body, soft tissue gas or acute bony abnormality. Moderate arthritic changes are present in the hand and wrist. Electronically Signed   By: Andreas Newport M.D.   On: 07/27/2015 00:24   I have personally reviewed and evaluated these images and lab results as part of my medical decision-making.   EKG Interpretation None      MDM   Final diagnoses:  Spontaneous hematoma of hand    82 year old female with left hand spontaneously hematoma. CBC, PT/INR are normal. Ace wrap is applied. Patient will notify cardiologist and/or PCP tomorrow morning of mild spontaneous  hematoma. Patient educated on red flags to return to the ER for.     Duanne Guess, PA-C 07/27/15 0037  Loney Hering, MD 07/27/15 [phone]

## 2015-07-27 NOTE — Discharge Instructions (Signed)
Please keep left upper extremity elevated, apply Ace wrap. Ice 20 minutes every hour. Please follow-up with PCP or cardiologist to discuss spontaneous hematoma to the left hand. Return to the ER for any increased pain, swelling, numbness or for any urgent changes in her health.

## 2015-07-29 ENCOUNTER — Encounter: Payer: Self-pay | Admitting: *Deleted

## 2015-07-29 DIAGNOSIS — Z9861 Coronary angioplasty status: Secondary | ICD-10-CM

## 2015-07-29 DIAGNOSIS — I2102 ST elevation (STEMI) myocardial infarction involving left anterior descending coronary artery: Secondary | ICD-10-CM

## 2015-07-29 NOTE — Progress Notes (Signed)
Cardiac Individual Treatment Plan  Patient Details  Name: Monique Burns MRN: [id number] Date of Birth: 1931-03-15 Referring Provider:  Yolonda Kida, MD  Initial Encounter Date:       Cardiac Rehab from 04/23/2015 in [facility] Cardiac and Pulmonary Rehab   Date  04/23/15      Visit Diagnosis: ST elevation (STEMI) myocardial infarction involving left anterior descending coronary artery ([place])  S/P PTCA (percutaneous transluminal coronary angioplasty)  Patient's Home Medications on Admission:  Current outpatient prescriptions:  .  aspirin 81 MG chewable tablet, Chew by mouth., Disp: , Rfl:  .  atorvastatin (LIPITOR) 80 MG tablet, Take 80 mg by mouth daily at 6 PM. , Disp: , Rfl:  .  B Complex-Biotin-FA (BALANCED B-100 PO), Take 1 tablet by mouth daily., Disp: , Rfl:  .  calcium-vitamin D (OSCAL WITH D) 500-200 MG-UNIT tablet, Take 1 tablet by mouth daily., Disp: , Rfl:  .  Cholecalciferol (VITAMIN D3) 1000 UNITS CAPS, Take 1 tablet by mouth daily., Disp: , Rfl:  .  cycloSPORINE (RESTASIS) 0.05 % ophthalmic emulsion, Place 1 drop into both eyes 2 (two) times daily. , Disp: , Rfl:  .  DULoxetine (CYMBALTA) 30 MG capsule, Take 30 mg by mouth daily., Disp: , Rfl: 2 .  isosorbide mononitrate (IMDUR) 60 MG 24 hr tablet, Take 60 mg by mouth. Taking half a tablet per md instructions, Disp: , Rfl:  .  metoprolol succinate (TOPROL-XL) 25 MG 24 hr tablet, Take 0.5 tablets by mouth daily. , Disp: , Rfl:  .  nitroGLYCERIN (NITROSTAT) 0.4 MG SL tablet, Place under the tongue., Disp: , Rfl:  .  polyethylene glycol powder (GLYCOLAX/MIRALAX) powder, Take by mouth., Disp: , Rfl:  .  ranitidine (ZANTAC) 150 MG capsule, Take 1 capsule by mouth 2 (two) times daily., Disp: , Rfl:   Past Medical History: Past Medical History  Diagnosis Date  . Hypertension   . Renal disorder   . Arthritis   . Thyroid disease   . Hyperlipidemia   . Leukopenia   . MI (myocardial infarction) ([place])     Tobacco  Use: History  Smoking status  . Never Smoker   Smokeless tobacco  . Not on file    Labs: Recent Review Flowsheet Data    There is no flowsheet data to display.       Exercise Target Goals:    Exercise Program Goal: Individual exercise prescription set with THRR, safety & activity barriers. Participant demonstrates ability to understand and report RPE using BORG scale, to self-measure pulse accurately, and to acknowledge the importance of the exercise prescription.  Exercise Prescription Goal: Starting with aerobic activity 30 plus minutes a day, 3 days per week for initial exercise prescription. Provide home exercise prescription and guidelines that participant acknowledges understanding prior to discharge.  Activity Barriers & Risk Stratification:     Activity Barriers & Cardiac Risk Stratification - 04/05/15 1323    Activity Barriers & Cardiac Risk Stratification   Activity Barriers Arthritis;Right Hip Replacement;Left Hip Replacement;Deconditioning;Joint Problems   Cardiac Risk Stratification High      6 Minute Walk:     6 Minute Walk      04/23/15 1303       6 Minute Walk   Phase Initial     Distance 815 feet     Walk Time 6 minutes     RPE 11     Symptoms Yes (comment)     Comments slight dizziness and unsteadiness  Resting HR 77 bpm     Resting BP 126/64 mmHg     Max Ex. HR 102 bpm     Max Ex. BP 140/58 mmHg        Initial Exercise Prescription:     Initial Exercise Prescription - 04/23/15 1300    Date of Initial Exercise Prescription   Date 04/23/15   Treadmill   MPH 1.2   Grade 0   Minutes 10   Bike   Burns 0.2   Watts 15   Minutes 10   Recumbant Bike   Burns 2   RPM 40   Watts 15   Minutes 15   NuStep   Burns 2   Watts 25   Minutes 15   Arm Ergometer   Burns 1   Watts 8   Minutes 10   Arm/Foot Ergometer   Burns 4   Watts 12   Minutes 10   Cybex   Burns 1   RPM 50   Minutes 10   Recumbant Elliptical   Burns 1    RPM 40   Watts 10   Minutes 10   REL-XR   Burns 2   Watts 30   Minutes 15   T5 Nustep   Burns 1   Watts 12   Minutes 15   Biostep-RELP   Burns 2   Watts 15   Minutes 15   Prescription Details   Frequency (times per week) 3   Duration Progress to 30 minutes of continuous aerobic without signs/symptoms of physical distress   Intensity   THRR REST +  30   Ratings of Perceived Exertion 11-15   Progression   Progression Continue progressive overload as per policy without signs/symptoms or physical distress.   Resistance Training   Training Prescription Yes   Weight 2   Reps 10-15      Perform Capillary Blood Glucose checks as needed.  Exercise Prescription Changes:     Exercise Prescription Changes      04/27/15 0900 05/04/15 1503 05/26/15 0700 06/29/15 0700 07/08/15 0949   Exercise Review   Progression No   No  Absent since 05/04/15    Response to Exercise   Blood Pressure (Admit)  118/62 mmHg   120/54 mmHg   Blood Pressure (Exercise)  124/62 mmHg   142/64 mmHg   Blood Pressure (Exit)  136/70 mmHg   132/68 mmHg   Heart Rate (Admit)  76 bpm   67 bpm   Heart Rate (Exercise)  99 bpm   85 bpm   Heart Rate (Exit)  75 bpm   57 bpm   Rating of Perceived Exertion (Exercise)  13      Symptoms  none      Comments First day of exercises! Patient was oriented to the equipment and exercise prescription was discussed. Patient was able to complete exercise today without signs or symptoms.   Patient as not attended since the date of the last 30 day review. Patient absent since 05/03/16, workloads will be re-evaluated upon return based on fitness and function Patient has not attending Heart Track recently related to recent onset of leg pain.  Goal is to resume Heart Track once leg difficulty is resolved.   Duration  Progress to 30 minutes of continuous aerobic without signs/symptoms of physical distress  Progress to 30 minutes of continuous aerobic without signs/symptoms of physical  distress    Intensity  THRR unchanged  THRR unchanged    Progression  Progression  Continue progressive overload as per policy without signs/symptoms or physical distress.  Continue progressive overload as per policy without signs/symptoms or physical distress.    Resistance Training   Training Prescription (read-only)  Yes  Yes    Weight (read-only)  2  2    Reps (read-only)  10-15  10-15    Interval Training   Interval Training  No  No    Treadmill   MPH (read-only)  1  1    Grade (read-only)  0  0    Minutes (read-only)  15  15    NuStep   Burns (read-only)  4  4    Watts (read-only)  20  20    Minutes (read-only)  15  15      07/08/15 0952           Exercise Review   Progression No  Absent since 05/04/15       Response to Exercise   Comments Patient absent since 05/03/16, workloads will be re-evaluated upon return based on fitness and function       Duration Progress to 30 minutes of continuous aerobic without signs/symptoms of physical distress       Intensity THRR unchanged       Progression   Progression Continue progressive overload as per policy without signs/symptoms or physical distress.       Interval Training   Interval Training No          Exercise Comments:   Discharge Exercise Prescription (Final Exercise Prescription Changes):     Exercise Prescription Changes - 07/08/15 0952    Exercise Review   Progression No  Absent since 05/04/15   Response to Exercise   Comments Patient absent since 05/03/16, workloads will be re-evaluated upon return based on fitness and function   Duration Progress to 30 minutes of continuous aerobic without signs/symptoms of physical distress   Intensity THRR unchanged   Progression   Progression Continue progressive overload as per policy without signs/symptoms or physical distress.   Interval Training   Interval Training No      Nutrition:  Target Goals: Understanding of nutrition guidelines, daily intake of  sodium <1562m, cholesterol <2074m calories 30% from fat and 7% or less from saturated fats, daily to have 5 or more servings of fruits and vegetables.  Biometrics:     Pre Biometrics - 04/23/15 1301    Pre Biometrics   Height 5' 8"  (1.727 m)   Weight 120 lb 14.4 oz (54.84 kg)   Waist Circumference 27 inches   Hip Circumference 35.25 inches   Waist to Hip Ratio 0.77 %   BMI (Calculated) 18.4       Nutrition Therapy Plan and Nutrition Goals:   Nutrition Discharge: Rate Your Plate Scores:     Nutrition Assessments - 05/06/15 0937    Rate Your Plate Scores   Pre Score 72   Pre Score % 80 %      Nutrition Goals Re-Evaluation:   Psychosocial: Target Goals: Acknowledge presence or absence of depression, maximize coping skills, provide positive support system. Participant is able to verbalize types and ability to use techniques and skills needed for reducing stress and depression.  Initial Review & Psychosocial Screening:     Initial Psych Review & Screening - 04/05/15 1341    Initial Review   Current issues with Current Stress Concerns   Source of Stress Concerns Chronic Illness;Unable to perform yard/household activities   Comments Monique Burns's husband has  been diagnosed with dementia. She deals with this daily.    Family Dynamics   Good Support System? Yes   Comments Monique Burns has good family support.  Children , nieces and nephews   Barriers   Psychosocial barriers to participate in program There are no identifiable barriers or psychosocial needs.;The patient should benefit from training in stress management and relaxation.   Screening Interventions   Interventions Encouraged to exercise      Quality of Life Scores:   PHQ-9:     Recent Review Flowsheet Data    Depression screen [facility] 2/9 04/05/2015   Decreased Interest 1   Down, Depressed, Hopeless 0   PHQ - 2 Score 1   Altered sleeping 1   Tired, decreased energy 2   Change in appetite 2   Feeling bad or failure  about yourself  0   Trouble concentrating 0   Moving slowly or fidgety/restless 0   Suicidal thoughts 0   PHQ-9 Score 6   Difficult doing work/chores Not difficult at all      Psychosocial Evaluation and Intervention:     Psychosocial Evaluation - 05/04/15 0939    Psychosocial Evaluation & Interventions   Interventions Encouraged to exercise with the program and follow exercise prescription;Stress management education   Comments Counselor met with Monique Burns today for initial psychosocial evaluattion.  She is an 82 year old who had a heart attack the last week of October.  She has a strong support system with a spouse of 30 years and several daughters/daughter in laws who live close by.  She is also actively involved in her local church community.  Monique Burns has other health issues that are stressful for her in addition to her heart problems, she also has severe arthritis and had both hips replaced a year ago.  She has suffered multiple deaths in her life with a son and spouse and (3) brothers, but she denies a history of depression or anxiety or any current symptoms.  Monique Burns reports relying on her faith to help her through this.  She has goals for this program to increase her strength and stamina so she will be able to "do normal stuff like clean the house, etc, and be able to walk outside with the neighbors."  Monique Burns will benefit from stress management education as she has a spouse who has dementia and this is stressful for her to care for him at this time.     Continued Psychosocial Services Needed Yes  Monique Burns will benefit from stress management education to help her cope with her current stressors.      Psychosocial Re-Evaluation:   Vocational Rehabilitation: Provide vocational rehab assistance to qualifying candidates.   Vocational Rehab Evaluation & Intervention:     Vocational Rehab - 04/05/15 1324    Initial Vocational Rehab Evaluation & Intervention   Assessment shows need for  Vocational Rehabilitation No      Education: Education Goals: Education classes will be provided on a weekly basis, covering required topics. Participant will state understanding/return demonstration of topics presented.  Learning Barriers/Preferences:     Learning Barriers/Preferences - 04/05/15 1323    Learning Barriers/Preferences   Learning Barriers None   Learning Preferences None      Education Topics: General Nutrition Guidelines/Fats and Fiber: -Group instruction provided by verbal, written material, models and posters to present the general guidelines for heart healthy nutrition. Gives an explanation and review of dietary fats and fiber.   Controlling Sodium/Reading  Food Labels: -Group verbal and written material supporting the discussion of sodium use in heart healthy nutrition. Review and explanation with models, verbal and written materials for utilization of the food label.   Exercise Physiology & Risk Factors: - Group verbal and written instruction with models to review the exercise physiology of the cardiovascular system and associated critical values. Details cardiovascular disease risk factors and the goals associated with each risk factor.          Cardiac Rehab from 07/08/2015 in [facility] Cardiac and Pulmonary Rehab   Date  07/06/15   Educator  BS   Instruction Review Code  2- meets goals/outcomes      Aerobic Exercise & Resistance Training: - Gives group verbal and written discussion on the health impact of inactivity. On the components of aerobic and resistive training programs and the benefits of this training and how to safely progress through these programs.      Cardiac Rehab from 07/08/2015 in [facility] Cardiac and Pulmonary Rehab   Date  07/08/15   Educator  BS   Instruction Review Code  2- meets goals/outcomes      Flexibility, Balance, General Exercise Guidelines: - Provides group verbal and written instruction on the benefits of flexibility and balance  training programs. Provides general exercise guidelines with specific guidelines to those with heart or lung disease. Demonstration and skill practice provided.   Stress Management: - Provides group verbal and written instruction about the health risks of elevated stress, cause of high stress, and healthy ways to reduce stress.   Depression: - Provides group verbal and written instruction on the correlation between heart/lung disease and depressed mood, treatment options, and the stigmas associated with seeking treatment.      Cardiac Rehab from 07/08/2015 in [facility] Cardiac and Pulmonary Rehab   Date  07/01/15   Educator  CE   Instruction Review Code  2- meets goals/outcomes      Anatomy & Physiology of the Heart: - Group verbal and written instruction and models provide basic cardiac anatomy and physiology, with the coronary electrical and arterial systems. Review of: AMI, Angina, Valve disease, Heart Failure, Cardiac Arrhythmia, Pacemakers, and the ICD.   Cardiac Procedures: - Group verbal and written instruction and models to describe the testing methods done to diagnose heart disease. Reviews the outcomes of the test results. Describes the treatment choices: Medical Management, Angioplasty, or Coronary Bypass Surgery.      Cardiac Rehab from 07/08/2015 in [facility] Cardiac and Pulmonary Rehab   Date  04/27/15   Educator  DW   Instruction Review Code  2- meets goals/outcomes      Cardiac Medications: - Group verbal and written instruction to review commonly prescribed medications for heart disease. Reviews the medication, class of the drug, and side effects. Includes the steps to properly store meds and maintain the prescription regimen.   Go Sex-Intimacy & Heart Disease, Get SMART - Goal Setting: - Group verbal and written instruction through game format to discuss heart disease and the return to sexual intimacy. Provides group verbal and written material to discuss and apply goal setting  through the application of the S.M.A.R.T. Method.      Cardiac Rehab from 07/08/2015 in [facility] Cardiac and Pulmonary Rehab   Date  04/27/15   Educator  DW   Instruction Review Code  2- meets goals/outcomes      Other Matters of the Heart: - Provides group verbal, written materials and models to describe Heart Failure, Angina, Valve Disease,  and Diabetes in the realm of heart disease. Includes description of the disease process and treatment options available to the cardiac patient.   Exercise & Equipment Safety: - Individual verbal instruction and demonstration of equipment use and safety with use of the equipment.      Cardiac Rehab from 07/08/2015 in [facility] Cardiac and Pulmonary Rehab   Date  04/05/15   Educator  SB   Instruction Review Code  2- meets goals/outcomes      Infection Prevention: - Provides verbal and written material to individual with discussion of infection control including proper hand washing and proper equipment cleaning during exercise session.      Cardiac Rehab from 07/08/2015 in [facility] Cardiac and Pulmonary Rehab   Date  04/05/15   Educator  SB   Instruction Review Code  2- meets goals/outcomes      Falls Prevention: - Provides verbal and written material to individual with discussion of falls prevention and safety.      Cardiac Rehab from 07/08/2015 in [facility] Cardiac and Pulmonary Rehab   Date  04/05/15   Educator  SB   Instruction Review Code  2- meets goals/outcomes      Diabetes: - Individual verbal and written instruction to review signs/symptoms of diabetes, desired ranges of glucose Burns fasting, after meals and with exercise. Advice that pre and post exercise glucose checks will be done for 3 sessions at entry of program.    Knowledge Questionnaire Score:     Knowledge Questionnaire Score - 04/05/15 1324    Knowledge Questionnaire Score   Pre Score 21/27      Personal Goals and Risk Factors at Admission:     Personal Goals and Risk Factors at  Admission - 04/05/15 1333    Core Components/Risk Factors/Patient Goals on Admission   Sedentary Sedentary  Recently had both hip replacements.  Has pain in the left knee. Today received injection in the knee. Wants to return to her ability to clean her house  the  way she was able to prior to her hip surgeries and the heart attack..   Intervention (read-only) Provide exercise education and an individualized exercise prescription that will provide continued progressive overload as per policy without signs/symptoms of physical distress.   Hypertension Yes   Goal Participant will see blood pressure controlled within the values of 140/76m/Hg or within value directed by their physician.   Intervention (read-only) Provide nutrition & aerobic exercise along with prescribed medications to achieve BP 140/90 or less.   Lipids Yes   Goal Cholesterol controlled with medications as prescribed, with individualized exercise RX and with personalized nutrition plan. Value goals: LDL < 737m HDL > 4044mParticipant states understanding of desired cholesterol values and following prescriptions.   Intervention (read-only) Provide nutrition & aerobic exercise along with prescribed medications to achieve LDL <39m47mDL >40mg39mStress Yes  Stress is present every day.  Monique Burns she can "deal with stress"  well.  Her husband has been diagnosed with dementia.    Goal To meet with psychosocial counselor for stress and relaxation information and guidance. To state understanding of performing relaxation techniques and or identifying personal stressors.   Intervention (read-only) Provide education on types of stress, identifiying stressors, and ways to cope with stress. Provide demonstration and active practice of relaxation techniques.      Personal Goals and Risk Factors Review:      Goals and Risk Factor Review      05/04/15 1031  Core Components/Risk Factors/Patient Goals Review   Personal Goals  Review Increase Aerobic Exercise and Physical Activity       Increase Aerobic Exercise and Physical Activity (read-only)   Goals Progress/Improvement seen  Yes       Comments Patient reports she has seen an increase in energy and activity seen starting the Cardiac Rehab program.  Patient stated she was able to cook and entire meal all by herself for Christmas Eve family meal.  Patient stated she would not have been able to do this before.         Hypertension (read-only)   Goal Participant will see blood pressure controlled within the values of 140/58m/Hg or within value directed by their physician.  Patient's BP on arrival to Cardiac Rehab has ranged from 110/62 - 126/64.  BP at the end of sessions has ranged from 116/62 to 136/70.         Abnormal Lipids (read-only)   Goal Cholesterol controlled with medications as prescribed, with individualized exercise RX and with personalized nutrition plan. Value goals: LDL < 727m HDL > 4083mParticipant states understanding of desired cholesterol values and following prescriptions.  No new lab values available at this time.            Personal Goals Discharge (Final Personal Goals and Risk Factors Review):      Goals and Risk Factor Review - 05/04/15 1031    Core Components/Risk Factors/Patient Goals Review   Personal Goals Review Increase Aerobic Exercise and Physical Activity   Increase Aerobic Exercise and Physical Activity (read-only)   Goals Progress/Improvement seen  Yes   Comments Patient reports she has seen an increase in energy and activity seen starting the Cardiac Rehab program.  Patient stated she was able to cook and entire meal all by herself for Christmas Eve family meal.  Patient stated she would not have been able to do this before.     Hypertension (read-only)   Goal Participant will see blood pressure controlled within the values of 140/77m80m or within value directed by their physician.  Patient's BP on arrival to Cardiac Rehab  has ranged from 110/62 - 126/64.  BP at the end of sessions has ranged from 116/62 to 136/70.     Abnormal Lipids (read-only)   Goal Cholesterol controlled with medications as prescribed, with individualized exercise RX and with personalized nutrition plan. Value goals: LDL < 70mg20mL > 40mg.52mticipant states understanding of desired cholesterol values and following prescriptions.  No new lab values available at this time.        ITP Comments:     ITP Comments      04/05/15 1326 04/05/15 1328 04/23/15 1535 05/09/15 1309 05/18/15 1359   ITP Comments Initial ITP created today for review and sign off by Dr M MillLoleta Chancele to walk today Had injection in the left knee.   Leanor cAradhyaeted her functional test evaluation today.  She plans to start the sessions next week. Ready for 30 day review.  Continue with ITP  New to program ,has attended 3 sessions. Spoke with Monique Burns. She had to have another stent and will be out until released to return to the program.     05/26/15 0715 06/03/15 1255 06/29/15 0852 07/15/15 1514 07/29/15 1443   ITP Comments Patient has not attended since the date of last review.  No changes are warranted at this time. Ready for 30 day review. Continue with ITP. HaS been absent since 12/27 visit.  Another  stent placement and then got RSV.  30 Day Review.  Continue with ITP.  Monique Burns is ready to return to the program. She showed up today with a large bruise behind her left knee and calf.  She was sent home to rest the leg and return when the bruise is resolved and not hurting her.  Monique Burns came to class with swelling at her right ankle.  She had recently been for swelling and tightness in the calf of the same leg. She was sent straight to her PMD for assessment.  Monique Burns called today to share that her PMD is not sure what is causing the swelling.  Does not think is a bloodclot.  Might be a cyst that has ruptured and needs to be absorbed.  She is staying out of class until  her leg is better  and she has the OK from her PMD to return to exercise.  30 Day Review. Continue with the ITP.  Has been out most of this month with problems with her leg.      Comments:

## 2015-08-05 ENCOUNTER — Ambulatory Visit: Payer: Medicare Other | Attending: Internal Medicine | Admitting: Occupational Therapy

## 2015-08-05 DIAGNOSIS — M25642 Stiffness of left hand, not elsewhere classified: Secondary | ICD-10-CM | POA: Diagnosis present

## 2015-08-05 DIAGNOSIS — M6281 Muscle weakness (generalized): Secondary | ICD-10-CM | POA: Insufficient documentation

## 2015-08-05 DIAGNOSIS — R600 Localized edema: Secondary | ICD-10-CM | POA: Insufficient documentation

## 2015-08-05 DIAGNOSIS — M79642 Pain in left hand: Secondary | ICD-10-CM | POA: Diagnosis present

## 2015-08-05 NOTE — Therapy (Signed)
[place] [facility] [address] [place], [address] Phone: [phone]   Fax:  [phone]  Occupational Therapy Treatment  Patient Details  Name: Monique Burns MRN: [id number] Date of Birth: 1930/10/24 Referring Provider: Caryl Comes   Encounter Date: 08/05/2015      OT End of Session - 08/05/15 [id number]    Visit Number 1   Number of Visits 12   Date for OT Re-Evaluation 09/16/15   OT Start Time 0902   OT Stop Time 0958   OT Time Calculation (min) 56 min   Activity Tolerance Patient tolerated treatment well   Behavior During Therapy [facility] for tasks assessed/performed      Past Medical History  Diagnosis Date  . Hypertension   . Renal disorder   . Arthritis   . Thyroid disease   . Hyperlipidemia   . Leukopenia   . MI (myocardial infarction) [facility])     Past Surgical History  Procedure Laterality Date  . Coronary angioplasty with stent placement      There were no vitals filed for this visit.  Visit Diagnosis:  Localized edema  Stiffness of left hand, not elsewhere classified  Pain in left hand  Muscle weakness (generalized)      Subjective Assessment - 08/05/15 0911    Subjective  This hematoma was as big as a tennisball - I just sit up in bed and my hand had a big old egg    Patient Stated Goals Want to get back my hand to normal    Currently in Pain? Yes   Pain Score 7    Pain Location Hand   Pain Orientation Left   Pain Descriptors / Indicators Burning;Pins and needles            [facility] OT Assessment - 08/05/15 0001    Assessment   Diagnosis L hand hematoma   Referring Provider Caryl Comes    Onset Date 07/26/15   Assessment Pt had hematoma come up suddenly  - refer by DR Caryl Comes and Dr Rudene Christians   Balance Screen   Has the patient fallen in the past 6 months No   Has the patient had a decrease in activity level because of a fear of falling?  No   Is the patient reluctant to leave their home because  of a fear of falling?  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 , husband has health issues, do own house work , like to [employer] at [employer] ; yard work   Edema   Edema MC's R 19.5; L 20.8 over MC's ; Webspace R19; L 20.5 cm ; proximal  3rd digit R 5.5 and L 6.5 cm    Left Hand AROM   L Thumb Radial ADduction/ABduction 0-55 52   L Thumb Palmar ADduction/ABduction 0-45 50   L Thumb Opposition to Index --  normal   L Index  MCP 0-90 45 Degrees   L Index PIP 0-100 75 Degrees   L Long  MCP 0-90 50 Degrees   L Long PIP 0-100 70 Degrees   L Ring  MCP 0-90 50 Degrees   L Ring PIP 0-100 60 Degrees   L Little  MCP 0-90 45 Degrees   L Little PIP 0-100 70 Degrees       contrast bath done -  and pt ed on manaul massage , ROM HEP  And compression                     OT Education - 08/21/15 1346    Education provided Yes   Education Details HEP   Person(s) Educated Patient   Methods Explanation;Demonstration;Tactile cues;Verbal cues;Handout   Comprehension Returned demonstration;Verbalized understanding          OT Short Term Goals - 08/21/15 1410    OT SHORT TERM GOAL #1   Title Edema over  dorsal L hand decrease by 1 cm to  have decrease pain on PRWHE by at least 15 points    Baseline Pain 32/50 on PRWHE   Time 3   Period Weeks   Status New   OT SHORT TERM GOAL #2   Title AROM in all L digits improve for pt to touch palm  to hold on to objects in ADL's and IADLs   Baseline Function on PRWHE is 29/50   Time 4   Period Weeks   Status New   OT SHORT TERM GOAL #3   Title Pt to be ind in HEP for edema control , increase ROM and strength    Baseline no knowledge    Time 4   Period Weeks   Status New           OT Long Term Goals - August 21, 2015 1357    OT LONG TERM GOAL #1   Title Grip strength improve in L hand to  at least 50% compare to R to   carry 5 lbs objects   Baseline grip NT - pt unable to touch palm    Time 6   Period Weeks   Status New   OT LONG TERM GOAL #2   Title Pt Functional use of L hand In ADL's improve  on PRWHE function score by 15 points    Baseline Function score 29/50    Time 6   Period Weeks   Status New               Plan - 08-21-15 1349    Clinical Impression Statement Pt present with hematoma on dorsal  L hand since 10 days ago - was seen by her primary and ortho MD's - refer to OT for hand therapy  - pt present with increase edema over dorsal hand and in all digits - with increase pain and decrease ROM in flexion and extention of all digits and some at wrist - unable to grip and decrease functional use of L hand in ADL's and IADL's    Pt will benefit from skilled therapeutic intervention in order to improve on the following deficits (Retired) Impaired flexibility;Decreased range of motion;Decreased coordination;Increased edema;Pain;Impaired UE functional use;Decreased strength   Rehab Potential Good   OT Frequency 2x / week   OT Duration 6 weeks   OT Treatment/Interventions Self-care/ADL training;Contrast Bath;Therapeutic exercise;Patient/family education;Manual Therapy;Manual lymph drainage;Compression bandaging;Passive range of motion   Plan Assess of edema decrease , check on HEP and increase ROM ?   OT Home Exercise Plan see pt instruction    Consulted and Agree with Plan of Care Patient          G-Codes - 21-Aug-2015 1404    Functional Assessment Tool Used ROM , strength , PRWHE , pain , clincial judgement -   Functional Limitation Self care   Self Care Current Status [id number]) At least 40 percent but less than 60 percent  impaired, limited or restricted   Self Care Goal Status [id number]) At least 1 percent but less than 20 percent impaired, limited or restricted      Problem List Patient Active Problem List   Diagnosis Date Noted  . Chronic kidney disease 04/05/2015  . HLD  (hyperlipidemia) 04/05/2015  . BP (high blood pressure) 04/05/2015  . Decreased leukocytes 04/05/2015  . Arthritis, degenerative 04/05/2015  . Disease of thyroid gland 04/05/2015  . Acute myocardial infarction of anterior wall ([place]) 02/28/2015  . Degenerative arthritis of hip 10/31/2013    Rosalyn Gess OTR/L,CLT  08/05/2015, 2:13 PM  [place] [facility] [address], [place], [address] Phone: [phone]   Fax:  [phone]  Name: Monique Burns MRN: [id number] Date of Birth: 05/07/1931

## 2015-08-05 NOTE — Patient Instructions (Addendum)
Contrast bath Massage to forearm and hand - distal to proximal  AROM tendon glides  Thumb PA and RA Opposition  Digits extention  10 reps each  2-3 x day   Cont with isotoner glove  Did do kinesiotape  for dorsal hand to wrist

## 2015-08-09 ENCOUNTER — Ambulatory Visit: Payer: Medicare Other | Attending: Internal Medicine | Admitting: Occupational Therapy

## 2015-08-09 DIAGNOSIS — M79642 Pain in left hand: Secondary | ICD-10-CM | POA: Diagnosis present

## 2015-08-09 DIAGNOSIS — M25642 Stiffness of left hand, not elsewhere classified: Secondary | ICD-10-CM | POA: Insufficient documentation

## 2015-08-09 DIAGNOSIS — M6281 Muscle weakness (generalized): Secondary | ICD-10-CM | POA: Diagnosis present

## 2015-08-09 DIAGNOSIS — R600 Localized edema: Secondary | ICD-10-CM | POA: Insufficient documentation

## 2015-08-09 NOTE — Patient Instructions (Addendum)
Pt to do bandages prior to contrast 3 x day   Bandage hand with Artiflex 10 cm , wrist <> forearm ; 6 cm short stretch from wrist <> hand   with pink medium foam - kept 10 min on

## 2015-08-09 NOTE — Therapy (Signed)
[place] [facility] [address], [place], [address] Phone: [phone]   Fax:  [phone]  Occupational Therapy Treatment  Patient Details  Name: Monique Burns MRN: [id number] Date of Birth: 04-01-1931 Referring Provider: Caryl Comes   Encounter Date: 08/09/2015      OT End of Session - 08/09/15 0858    OT Start Time 0808   OT Stop Time 0855   OT Time Calculation (min) 47 min   Activity Tolerance Patient tolerated treatment well   Behavior During Therapy [facility] for tasks assessed/performed      Past Medical History  Diagnosis Date  . Hypertension   . Renal disorder   . Arthritis   . Thyroid disease   . Hyperlipidemia   . Leukopenia   . MI (myocardial infarction) [facility])     Past Surgical History  Procedure Laterality Date  . Coronary angioplasty with stent placement      There were no vitals filed for this visit.  Visit Diagnosis:  Localized edema  Stiffness of left hand, not elsewhere classified  Pain in left hand  Muscle weakness (generalized)      Subjective Assessment - 08/09/15 0821    Subjective  My husband says he can see that is it getting better - I did like you told me - bruising it better - still sore forearm   Patient Stated Goals Want to get back my hand to normal    Currently in Pain? Yes   Pain Score 5    Pain Location Hand   Pain Orientation Left   Pain Descriptors / Indicators Sore          Contrast 4 min heat, 1 min cold -  Alternate 2 x  And end heat   Soft tissue mobs massage  Distal to proximal forearm , hand , digits   Bandage hand with Artiflex 10 cm , wrist <> forearm ; 6 cm short stretch from wrist <> hand   with pink medium foam - kept 10 min on    AROM tendon glides  Digits extention  Opposition  Alternate digits                     OT Education - 08/09/15 0827    Education provided Yes   Education Details HEP   Person(s) Educated Patient    Methods Explanation;Demonstration;Tactile cues;Verbal cues   Comprehension Verbal cues required;Returned demonstration;Verbalized understanding          OT Short Term Goals - 08/05/15 1410    OT SHORT TERM GOAL #1   Title Edema over  dorsal L hand decrease by 1 cm to  have decrease pain on PRWHE by at least 15 points    Baseline Pain 32/50 on PRWHE   Time 3   Period Weeks   Status New   OT SHORT TERM GOAL #2   Title AROM in all L digits improve for pt to touch palm  to hold on to objects in ADL's and IADLs   Baseline Function on PRWHE is 29/50   Time 4   Period Weeks   Status New   OT SHORT TERM GOAL #3   Title Pt to be ind in HEP for edema control , increase ROM and strength    Baseline no knowledge    Time 4   Period Weeks   Status New           OT Long Term Goals -  08/05/15 1357    OT LONG TERM GOAL #1   Title Grip strength improve in L hand to  at least 50% compare to R to  carry 5 lbs objects   Baseline grip NT - pt unable to touch palm    Time 6   Period Weeks   Status New   OT LONG TERM GOAL #2   Title Pt Functional use of L hand In ADL's improve  on PRWHE function score by 15 points    Baseline Function score 29/50    Time 6   Period Weeks   Status New               Plan - 08/09/15 [id number]    Clinical Impression Statement Pt swelling improving - coming in and measured and compare to last time - and after compression wrap done for 10 min - remeasured - decreased again - and got softer - composite flexion tight    Pt will benefit from skilled therapeutic intervention in order to improve on the following deficits (Retired) Impaired flexibility;Decreased range of motion;Decreased coordination;Increased edema;Pain;Impaired UE functional use;Decreased strength   Rehab Potential Good   OT Frequency 2x / week   OT Duration 6 weeks   OT Treatment/Interventions Self-care/ADL training;Contrast Bath;Therapeutic exercise;Patient/family education;Manual  Therapy;Manual lymph drainage;Compression bandaging;Passive range of motion   Plan cont to asess progress in edema and increase ROM    OT Home Exercise Plan see pt instruction    Consulted and Agree with Plan of Care Patient        Problem List Patient Active Problem List   Diagnosis Date Noted  . Chronic kidney disease 04/05/2015  . HLD (hyperlipidemia) 04/05/2015  . BP (high blood pressure) 04/05/2015  . Decreased leukocytes 04/05/2015  . Arthritis, degenerative 04/05/2015  . Disease of thyroid gland 04/05/2015  . Acute myocardial infarction of anterior wall ([place]) 02/28/2015  . Degenerative arthritis of hip 10/31/2013    Rosalyn Gess OTR/L,CLT 08/09/2015, 9:01 AM  [place] [facility] [address] [place], [address] Phone: [phone]   Fax:  [phone]  Name: Monique Burns MRN: [id number] Date of Birth: 1930-08-03

## 2015-08-11 ENCOUNTER — Ambulatory Visit: Payer: Medicare Other | Admitting: Occupational Therapy

## 2015-08-11 DIAGNOSIS — M25642 Stiffness of left hand, not elsewhere classified: Secondary | ICD-10-CM

## 2015-08-11 DIAGNOSIS — R600 Localized edema: Secondary | ICD-10-CM | POA: Diagnosis not present

## 2015-08-11 DIAGNOSIS — M6281 Muscle weakness (generalized): Secondary | ICD-10-CM

## 2015-08-11 DIAGNOSIS — M79642 Pain in left hand: Secondary | ICD-10-CM

## 2015-08-11 NOTE — Therapy (Signed)
[place] [facility] [address] [place], [address] Phone: [phone]   Fax:  [phone]  Occupational Therapy Treatment  Patient Details  Name: Monique Burns MRN: [id number] Date of Birth: [birth year] Referring Provider: Caryl Comes   Encounter Date: 08/11/2015      OT End of Session - 08/11/15 1040    Visit Number 3   Number of Visits 12   Date for OT Re-Evaluation 09/16/15   OT Start Time 0940   OT Stop Time 1036   OT Time Calculation (min) 56 min   Activity Tolerance Patient tolerated treatment well   Behavior During Therapy [facility] for tasks assessed/performed      Past Medical History  Diagnosis Date  . Hypertension   . Renal disorder   . Arthritis   . Thyroid disease   . Hyperlipidemia   . Leukopenia   . MI (myocardial infarction) [facility])     Past Surgical History  Procedure Laterality Date  . Coronary angioplasty with stent placement      There were no vitals filed for this visit.  Visit Diagnosis:  Localized edema  Stiffness of left hand, not elsewhere classified  Pain in left hand  Muscle weakness (generalized)      Subjective Assessment - 08/11/15 0943    Subjective  I did bandage like you told me - it was really smaller and not as painfulll yesterday am - forearm tender still    Patient Stated Goals Want to get back my hand to normal    Currently in Pain? Yes   Pain Score 7    Pain Location Hand   Pain Orientation Left   Pain Descriptors / Indicators Sore      Bandage hand with glove , pink foam , and 6 cm short stretch bandage wrist, 3 over and  Thru thumb and back to wrist and forearm  Kept on during Contrast bath  Contrast 3 min heat, 1 min cold - Alternate 2 x And end heat - place hand in fist during 2 of heat cycles  Soft tissue mobs massage Distal to proximal forearm , hand , digits - manual massage done at edge of pocket of swelling proximal - skin felt tight and adhere -  pt to work on it at home   PROM composite flexion each digits  AROM tendon glides  Digits extention - able to do AROM 2nd and 5th - but unable 3rd and 4th  PROM - and add rolling of teal putty for extention  Opposition Alternate digits PIP's improve - but composite  Limited                           OT Education - 08/11/15 1040    Education provided Yes   Education Details HEP   Person(s) Educated Patient   Methods Explanation;Demonstration;Tactile cues;Verbal cues;Handout   Comprehension Verbalized understanding;Returned demonstration;Verbal cues required          OT Short Term Goals - 08/05/15 1410    OT SHORT TERM GOAL #1   Title Edema over  dorsal L hand decrease by 1 cm to  have decrease pain on PRWHE by at least 15 points    Baseline Pain 32/50 on PRWHE   Time 3   Period Weeks   Status New   OT SHORT TERM GOAL #2   Title AROM in all L digits improve for pt to touch palm  to hold on to objects in ADL's and IADLs   Baseline Function on PRWHE is 29/50   Time 4   Period Weeks   Status New   OT SHORT TERM GOAL #3   Title Pt to be ind in HEP for edema control , increase ROM and strength    Baseline no knowledge    Time 4   Period Weeks   Status New           OT Long Term Goals - 08/05/15 1357    OT LONG TERM GOAL #1   Title Grip strength improve in L hand to  at least 50% compare to R to  carry 5 lbs objects   Baseline grip NT - pt unable to touch palm    Time 6   Period Weeks   Status New   OT LONG TERM GOAL #2   Title Pt Functional use of L hand In ADL's improve  on PRWHE function score by 15 points    Baseline Function score 29/50    Time 6   Period Weeks   Status New               Plan - 08/11/15 1041    Clinical Impression Statement Pt swelling is improving and getting smaller - but skin tight proximal - pt to do some manual - to loosen up and for fluid to drain proximal - after bandaging and contrast - swelling  decrease at CM and over webspace 1/2 cm -  flexion at PIP improving but composite impaired - and  MC flexion -    Pt will benefit from skilled therapeutic intervention in order to improve on the following deficits (Retired) Impaired flexibility;Decreased range of motion;Decreased coordination;Increased edema;Pain;Impaired UE functional use;Decreased strength   Rehab Potential Good   OT Frequency 2x / week   OT Duration 4 weeks   OT Treatment/Interventions Self-care/ADL training;Contrast Bath;Therapeutic exercise;Patient/family education;Manual Therapy;Manual lymph drainage;Compression bandaging;Passive range of motion   Plan assess edema pocket - flexion    OT Home Exercise Plan see pt instruction    Consulted and Agree with Plan of Care Patient        Problem List Patient Active Problem List   Diagnosis Date Noted  . Chronic kidney disease 04/05/2015  . HLD (hyperlipidemia) 04/05/2015  . BP (high blood pressure) 04/05/2015  . Decreased leukocytes 04/05/2015  . Arthritis, degenerative 04/05/2015  . Disease of thyroid gland 04/05/2015  . Acute myocardial infarction of anterior wall ([place]) 02/28/2015  . Degenerative arthritis of hip 10/31/2013    Rosalyn Gess OTR/L,CLT  08/11/2015, 10:44 AM  [place] [facility] [address] [place], [address] Phone: [phone]   Fax:  [phone]  Name: Monique Burns MRN: [id number] Date of Birth: 1930/11/17

## 2015-08-11 NOTE — Patient Instructions (Addendum)
Add composite flexion PROM to all digits  tapping - PROM to 3rd and 4ht Add rolling of teal putty

## 2015-08-12 ENCOUNTER — Telehealth: Payer: Self-pay | Admitting: *Deleted

## 2015-08-12 ENCOUNTER — Encounter: Payer: Self-pay | Admitting: *Deleted

## 2015-08-12 DIAGNOSIS — I2102 ST elevation (STEMI) myocardial infarction involving left anterior descending coronary artery: Secondary | ICD-10-CM

## 2015-08-12 NOTE — Progress Notes (Signed)
Cardiac Individual Treatment Plan  Patient Details  Name: NITHYA MERIWEATHER MRN: [id number] Date of Birth: [birth year] Referring Provider:  No ref. provider found  Initial Encounter Date:       Cardiac Rehab from 04/23/2015 in [facility] Cardiac and Pulmonary Rehab   Date  04/23/15      Visit Diagnosis: ST elevation (STEMI) myocardial infarction involving left anterior descending coronary artery (HCC)  Patient's Home Medications on Admission:  Current outpatient prescriptions:  .  aspirin 81 MG chewable tablet, Chew by mouth., Disp: , Rfl:  .  atorvastatin (LIPITOR) 80 MG tablet, Take 80 mg by mouth daily at 6 PM. , Disp: , Rfl:  .  B Complex-Biotin-FA (BALANCED B-100 PO), Take 1 tablet by mouth daily., Disp: , Rfl:  .  calcium-vitamin D (OSCAL WITH D) 500-200 MG-UNIT tablet, Take 1 tablet by mouth daily., Disp: , Rfl:  .  Cholecalciferol (VITAMIN D3) 1000 UNITS CAPS, Take 1 tablet by mouth daily., Disp: , Rfl:  .  cycloSPORINE (RESTASIS) 0.05 % ophthalmic emulsion, Place 1 drop into both eyes 2 (two) times daily. , Disp: , Rfl:  .  DULoxetine (CYMBALTA) 30 MG capsule, Take 30 mg by mouth daily., Disp: , Rfl: 2 .  isosorbide mononitrate (IMDUR) 60 MG 24 hr tablet, Take 60 mg by mouth. Taking half a tablet per md instructions, Disp: , Rfl:  .  metoprolol succinate (TOPROL-XL) 25 MG 24 hr tablet, Take 0.5 tablets by mouth daily. , Disp: , Rfl:  .  nitroGLYCERIN (NITROSTAT) 0.4 MG SL tablet, Place under the tongue., Disp: , Rfl:  .  polyethylene glycol powder (GLYCOLAX/MIRALAX) powder, Take by mouth., Disp: , Rfl:  .  ranitidine (ZANTAC) 150 MG capsule, Take 1 capsule by mouth 2 (two) times daily., Disp: , Rfl:   Past Medical History: Past Medical History  Diagnosis Date  . Hypertension   . Renal disorder   . Arthritis   . Thyroid disease   . Hyperlipidemia   . Leukopenia   . MI (myocardial infarction) ([place])     Tobacco Use: History  Smoking status  . Never Smoker   Smokeless  tobacco  . Not on file    Labs: Recent Review Flowsheet Data    There is no flowsheet data to display.       Exercise Target Goals:    Exercise Program Goal: Individual exercise prescription set with THRR, safety & activity barriers. Participant demonstrates ability to understand and report RPE using BORG scale, to self-measure pulse accurately, and to acknowledge the importance of the exercise prescription.  Exercise Prescription Goal: Starting with aerobic activity 30 plus minutes a day, 3 days per week for initial exercise prescription. Provide home exercise prescription and guidelines that participant acknowledges understanding prior to discharge.  Activity Barriers & Risk Stratification:     Activity Barriers & Cardiac Risk Stratification - 04/05/15 1323    Activity Barriers & Cardiac Risk Stratification   Activity Barriers Arthritis;Right Hip Replacement;Left Hip Replacement;Deconditioning;Joint Problems   Cardiac Risk Stratification High      6 Minute Walk:     6 Minute Walk      04/23/15 1303       6 Minute Walk   Phase Initial     Distance 815 feet     Walk Time 6 minutes     RPE 11     Symptoms Yes (comment)     Comments slight dizziness and unsteadiness      Resting HR 77 bpm  Resting BP 126/64 mmHg     Max Ex. HR 102 bpm     Max Ex. BP 140/58 mmHg        Initial Exercise Prescription:     Initial Exercise Prescription - 04/23/15 1300    Date of Initial Exercise Prescription   Date 04/23/15   Treadmill   MPH 1.2   Grade 0   Minutes 10   Bike   Level 0.2   Watts 15   Minutes 10   Recumbant Bike   Level 2   RPM 40   Watts 15   Minutes 15   NuStep   Level 2   Watts 25   Minutes 15   Arm Ergometer   Level 1   Watts 8   Minutes 10   Arm/Foot Ergometer   Level 4   Watts 12   Minutes 10   Cybex   Level 1   RPM 50   Minutes 10   Recumbant Elliptical   Level 1   RPM 40   Watts 10   Minutes 10   REL-XR   Level 2   Watts  30   Minutes 15   T5 Nustep   Level 1   Watts 12   Minutes 15   Biostep-RELP   Level 2   Watts 15   Minutes 15   Prescription Details   Frequency (times per week) 3   Duration Progress to 30 minutes of continuous aerobic without signs/symptoms of physical distress   Intensity   THRR REST +  30   Ratings of Perceived Exertion 11-15   Progression   Progression Continue progressive overload as per policy without signs/symptoms or physical distress.   Resistance Training   Training Prescription Yes   Weight 2   Reps 10-15      Perform Capillary Blood Glucose checks as needed.  Exercise Prescription Changes:     Exercise Prescription Changes      04/27/15 0900 05/04/15 1503 05/26/15 0700 06/29/15 0700 07/08/15 0949   Exercise Review   Progression No   No  Absent since 05/04/15    Response to Exercise   Blood Pressure (Admit)  118/62 mmHg   120/54 mmHg   Blood Pressure (Exercise)  124/62 mmHg   142/64 mmHg   Blood Pressure (Exit)  136/70 mmHg   132/68 mmHg   Heart Rate (Admit)  76 bpm   67 bpm   Heart Rate (Exercise)  99 bpm   85 bpm   Heart Rate (Exit)  75 bpm   57 bpm   Rating of Perceived Exertion (Exercise)  13      Symptoms  none      Comments First day of exercises! Patient was oriented to the equipment and exercise prescription was discussed. Patient was able to complete exercise today without signs or symptoms.   Patient as not attended since the date of the last 30 day review. Patient absent since 05/03/16, workloads will be re-evaluated upon return based on fitness and function Patient has not attending Heart Track recently related to recent onset of leg pain.  Goal is to resume Heart Track once leg difficulty is resolved.   Duration  Progress to 30 minutes of continuous aerobic without signs/symptoms of physical distress  Progress to 30 minutes of continuous aerobic without signs/symptoms of physical distress    Intensity  THRR unchanged  THRR unchanged     Progression   Progression  Continue progressive overload as per policy  without signs/symptoms or physical distress.  Continue progressive overload as per policy without signs/symptoms or physical distress.    Resistance Training   Training Prescription (read-only)  Yes  Yes    Weight (read-only)  2  2    Reps (read-only)  10-15  10-15    Interval Training   Interval Training  No  No    Treadmill   MPH (read-only)  1  1    Grade (read-only)  0  0    Minutes (read-only)  15  15    NuStep   Level (read-only)  4  4    Watts (read-only)  20  20    Minutes (read-only)  15  15      07/08/15 0952           Exercise Review   Progression No  Absent since 05/04/15       Response to Exercise   Comments Patient absent since 05/03/16, workloads will be re-evaluated upon return based on fitness and function       Duration Progress to 30 minutes of continuous aerobic without signs/symptoms of physical distress       Intensity THRR unchanged       Progression   Progression Continue progressive overload as per policy without signs/symptoms or physical distress.       Interval Training   Interval Training No          Exercise Comments:   Discharge Exercise Prescription (Final Exercise Prescription Changes):     Exercise Prescription Changes - 07/08/15 0952    Exercise Review   Progression No  Absent since 05/04/15   Response to Exercise   Comments Patient absent since 05/03/16, workloads will be re-evaluated upon return based on fitness and function   Duration Progress to 30 minutes of continuous aerobic without signs/symptoms of physical distress   Intensity THRR unchanged   Progression   Progression Continue progressive overload as per policy without signs/symptoms or physical distress.   Interval Training   Interval Training No      Nutrition:  Target Goals: Understanding of nutrition guidelines, daily intake of sodium <1586m, cholesterol <2068m calories 30% from fat and 7%  or less from saturated fats, daily to have 5 or more servings of fruits and vegetables.  Biometrics:     Pre Biometrics - 04/23/15 1301    Pre Biometrics   Height 5' 8" (1.727 m)   Weight 120 lb 14.4 oz (54.84 kg)   Waist Circumference 27 inches   Hip Circumference 35.25 inches   Waist to Hip Ratio 0.77 %   BMI (Calculated) 18.4       Nutrition Therapy Plan and Nutrition Goals:   Nutrition Discharge: Rate Your Plate Scores:     Nutrition Assessments - 05/06/15 0937    Rate Your Plate Scores   Pre Score 72   Pre Score % 80 %      Nutrition Goals Re-Evaluation:   Psychosocial: Target Goals: Acknowledge presence or absence of depression, maximize coping skills, provide positive support system. Participant is able to verbalize types and ability to use techniques and skills needed for reducing stress and depression.  Initial Review & Psychosocial Screening:     Initial Psych Review & Screening - 04/05/15 1341    Initial Review   Current issues with Current Stress Concerns   Source of Stress Concerns Chronic Illness;Unable to perform yard/household activities   Comments Dajiah's husband has been diagnosed with dementia. She deals with this  daily.    Family Dynamics   Good Support System? Yes   Comments Toshiko has good family support.  Children , nieces and nephews   Barriers   Psychosocial barriers to participate in program There are no identifiable barriers or psychosocial needs.;The patient should benefit from training in stress management and relaxation.   Screening Interventions   Interventions Encouraged to exercise      Quality of Life Scores:   PHQ-9:     Recent Review Flowsheet Data    Depression screen [facility] 2/9 04/05/2015   Decreased Interest 1   Down, Depressed, Hopeless 0   PHQ - 2 Score 1   Altered sleeping 1   Tired, decreased energy 2   Change in appetite 2   Feeling bad or failure about yourself  0   Trouble concentrating 0   Moving slowly or  fidgety/restless 0   Suicidal thoughts 0   PHQ-9 Score 6   Difficult doing work/chores Not difficult at all      Psychosocial Evaluation and Intervention:     Psychosocial Evaluation - 05/04/15 0939    Psychosocial Evaluation & Interventions   Interventions Encouraged to exercise with the program and follow exercise prescription;Stress management education   Comments Counselor met with Ms. Millay today for initial psychosocial evaluattion.  She is an 82 year old who had a heart attack the last week of October.  She has a strong support system with a spouse of 30 years and several daughters/daughter in laws who live close by.  She is also actively involved in her local church community.  Ms. Darnell Level has other health issues that are stressful for her in addition to her heart problems, she also has severe arthritis and had both hips replaced a year ago.  She has suffered multiple deaths in her life with a son and spouse and (3) brothers, but she denies a history of depression or anxiety or any current symptoms.  Ms. Darnell Level reports relying on her faith to help her through this.  She has goals for this program to increase her strength and stamina so she will be able to "do normal stuff like clean the house, etc, and be able to walk outside with the neighbors."  Ms. G will benefit from stress management education as she has a spouse who has dementia and this is stressful for her to care for him at this time.     Continued Psychosocial Services Needed Yes  Ms. G will benefit from stress management education to help her cope with her current stressors.      Psychosocial Re-Evaluation:     Psychosocial Re-Evaluation      08/12/15 1447           Psychosocial Re-Evaluation   Interventions Encouraged to attend Cardiac Rehabilitation for the exercise       Comments Lurleen reports she has had to go to multiple doctors appts lately and she is sorry that she hasn't been able to exercise in Cardiac Rehab lately  due to her right hand hematoma.           Vocational Rehabilitation: Provide vocational rehab assistance to qualifying candidates.   Vocational Rehab Evaluation & Intervention:     Vocational Rehab - 04/05/15 1324    Initial Vocational Rehab Evaluation & Intervention   Assessment shows need for Vocational Rehabilitation No      Education: Education Goals: Education classes will be provided on a weekly basis, covering required topics. Participant will state understanding/return  demonstration of topics presented.  Learning Barriers/Preferences:     Learning Barriers/Preferences - 04/05/15 1323    Learning Barriers/Preferences   Learning Barriers None   Learning Preferences None      Education Topics: General Nutrition Guidelines/Fats and Fiber: -Group instruction provided by verbal, written material, models and posters to present the general guidelines for heart healthy nutrition. Gives an explanation and review of dietary fats and fiber.   Controlling Sodium/Reading Food Labels: -Group verbal and written material supporting the discussion of sodium use in heart healthy nutrition. Review and explanation with models, verbal and written materials for utilization of the food label.   Exercise Physiology & Risk Factors: - Group verbal and written instruction with models to review the exercise physiology of the cardiovascular system and associated critical values. Details cardiovascular disease risk factors and the goals associated with each risk factor.          Cardiac Rehab from 07/08/2015 in [facility] Cardiac and Pulmonary Rehab   Date  07/06/15   Educator  BS   Instruction Review Code  2- meets goals/outcomes      Aerobic Exercise & Resistance Training: - Gives group verbal and written discussion on the health impact of inactivity. On the components of aerobic and resistive training programs and the benefits of this training and how to safely progress through these  programs.      Cardiac Rehab from 07/08/2015 in [facility] Cardiac and Pulmonary Rehab   Date  07/08/15   Educator  BS   Instruction Review Code  2- meets goals/outcomes      Flexibility, Balance, General Exercise Guidelines: - Provides group verbal and written instruction on the benefits of flexibility and balance training programs. Provides general exercise guidelines with specific guidelines to those with heart or lung disease. Demonstration and skill practice provided.   Stress Management: - Provides group verbal and written instruction about the health risks of elevated stress, cause of high stress, and healthy ways to reduce stress.   Depression: - Provides group verbal and written instruction on the correlation between heart/lung disease and depressed mood, treatment options, and the stigmas associated with seeking treatment.      Cardiac Rehab from 07/08/2015 in [facility] Cardiac and Pulmonary Rehab   Date  07/01/15   Educator  CE   Instruction Review Code  2- meets goals/outcomes      Anatomy & Physiology of the Heart: - Group verbal and written instruction and models provide basic cardiac anatomy and physiology, with the coronary electrical and arterial systems. Review of: AMI, Angina, Valve disease, Heart Failure, Cardiac Arrhythmia, Pacemakers, and the ICD.   Cardiac Procedures: - Group verbal and written instruction and models to describe the testing methods done to diagnose heart disease. Reviews the outcomes of the test results. Describes the treatment choices: Medical Management, Angioplasty, or Coronary Bypass Surgery.      Cardiac Rehab from 07/08/2015 in [facility] Cardiac and Pulmonary Rehab   Date  04/27/15   Educator  DW   Instruction Review Code  2- meets goals/outcomes      Cardiac Medications: - Group verbal and written instruction to review commonly prescribed medications for heart disease. Reviews the medication, class of the drug, and side effects. Includes the steps to  properly store meds and maintain the prescription regimen.   Go Sex-Intimacy & Heart Disease, Get SMART - Goal Setting: - Group verbal and written instruction through game format to discuss heart disease and the return to sexual intimacy. Provides group  verbal and written material to discuss and apply goal setting through the application of the S.M.A.R.T. Method.      Cardiac Rehab from 07/08/2015 in [facility] Cardiac and Pulmonary Rehab   Date  04/27/15   Educator  DW   Instruction Review Code  2- meets goals/outcomes      Other Matters of the Heart: - Provides group verbal, written materials and models to describe Heart Failure, Angina, Valve Disease, and Diabetes in the realm of heart disease. Includes description of the disease process and treatment options available to the cardiac patient.   Exercise & Equipment Safety: - Individual verbal instruction and demonstration of equipment use and safety with use of the equipment.      Cardiac Rehab from 07/08/2015 in [facility] Cardiac and Pulmonary Rehab   Date  04/05/15   Educator  SB   Instruction Review Code  2- meets goals/outcomes      Infection Prevention: - Provides verbal and written material to individual with discussion of infection control including proper hand washing and proper equipment cleaning during exercise session.      Cardiac Rehab from 07/08/2015 in [facility] Cardiac and Pulmonary Rehab   Date  04/05/15   Educator  SB   Instruction Review Code  2- meets goals/outcomes      Falls Prevention: - Provides verbal and written material to individual with discussion of falls prevention and safety.      Cardiac Rehab from 07/08/2015 in [facility] Cardiac and Pulmonary Rehab   Date  04/05/15   Educator  SB   Instruction Review Code  2- meets goals/outcomes      Diabetes: - Individual verbal and written instruction to review signs/symptoms of diabetes, desired ranges of glucose level fasting, after meals and with exercise. Advice that pre and  post exercise glucose checks will be done for 3 sessions at entry of program.    Knowledge Questionnaire Score:     Knowledge Questionnaire Score - 04/05/15 1324    Knowledge Questionnaire Score   Pre Score 21/27      Core Components/Risk Factors/Patient Goals at Admission:     Personal Goals and Risk Factors at Admission - 04/05/15 1333    Core Components/Risk Factors/Patient Goals on Admission   Sedentary Sedentary  Recently had both hip replacements.  Has pain in the left knee. Today received injection in the knee. Wants to return to her ability to clean her house  the  way she was able to prior to her hip surgeries and the heart attack..   Intervention (read-only) Provide exercise education and an individualized exercise prescription that will provide continued progressive overload as per policy without signs/symptoms of physical distress.   Hypertension Yes   Goal Participant will see blood pressure controlled within the values of 140/66m/Hg or within value directed by their physician.   Intervention (read-only) Provide nutrition & aerobic exercise along with prescribed medications to achieve BP 140/90 or less.   Lipids Yes   Goal Cholesterol controlled with medications as prescribed, with individualized exercise RX and with personalized nutrition plan. Value goals: LDL < 775m HDL > 4036mParticipant states understanding of desired cholesterol values and following prescriptions.   Intervention (read-only) Provide nutrition & aerobic exercise along with prescribed medications to achieve LDL <2m37mDL >40mg35mStress Yes  Stress is present every day.  Tamina Shahdes she can "deal with stress"  well.  Her husband has been diagnosed with dementia.    Goal To meet with psychosocial counselor for  stress and relaxation information and guidance. To state understanding of performing relaxation techniques and or identifying personal stressors.   Intervention (read-only) Provide education on  types of stress, identifiying stressors, and ways to cope with stress. Provide demonstration and active practice of relaxation techniques.      Core Components/Risk Factors/Patient Goals Review:      Goals and Risk Factor Review      05/04/15 1031 08/12/15 1447         Core Components/Risk Factors/Patient Goals Review   Personal Goals Review Increase Aerobic Exercise and Physical Activity Sedentary      Review  Jozee Hammer stopped by to show me her right hand hematoma. She is unable to attend Cardiac Rehab recently due to this. Her leg Baker's Cyst is better but now her problem is her right hand. Kaytlin reports she has seen Dr. Caryl Comes twice about this and Dr. Vinetta Bergamo once. Noelene said she is suppose to see DR. Callwood on Tuesday and that she is still suppose to take her [place] for her heart      Increase Aerobic Exercise and Physical Activity (read-only)   Goals Progress/Improvement seen  Yes       Comments Patient reports she has seen an increase in energy and activity seen starting the Cardiac Rehab program.  Patient stated she was able to cook and entire meal all by herself for Christmas Eve family meal.  Patient stated she would not have been able to do this before.         Hypertension (read-only)   Goal Participant will see blood pressure controlled within the values of 140/59m/Hg or within value directed by their physician.  Patient's BP on arrival to Cardiac Rehab has ranged from 110/62 - 126/64.  BP at the end of sessions has ranged from 116/62 to 136/70.         Abnormal Lipids (read-only)   Goal Cholesterol controlled with medications as prescribed, with individualized exercise RX and with personalized nutrition plan. Value goals: LDL < 778m HDL > 4059mParticipant states understanding of desired cholesterol values and following prescriptions.  No new lab values available at this time.            Core Components/Risk Factors/Patient Goals at Discharge (Final Review):       Goals and Risk Factor Review - 08/12/15 1447    Core Components/Risk Factors/Patient Goals Review   Personal Goals Review Sedentary   Review JeaJozette Castrellonopped by to show me her right hand hematoma. She is unable to attend Cardiac Rehab recently due to this. Her leg Baker's Cyst is better but now her problem is her right hand. JeaDezireports she has seen Dr. KleCaryl Comesice about this and Dr. MimVinetta Bergamoce. JeaDestynid she is suppose to see DR. Callwood on Tuesday and that she is still suppose to take her [place] for her heart      ITP Comments:     ITP Comments      04/05/15 1326 04/05/15 1328 04/23/15 1535 05/09/15 1309 05/18/15 1359   ITP Comments Initial ITP created today for review and sign off by Dr M MLoleta Chancenable to walk today Had injection in the left knee.   JeaCharellmpleted her functional test evaluation today.  She plans to start the sessions next week. Ready for 30 day review.  Continue with ITP  New to program ,has attended 3 sessions. Spoke with JeaRomie Minusday. She had to have another stent and will be out until released to  return to the program.     05/26/15 0715 06/03/15 1255 06/29/15 0852 07/15/15 1514 07/29/15 1443   ITP Comments Patient has not attended since the date of last review.  No changes are warranted at this time. Ready for 30 day review. Continue with ITP. HaS been absent since 12/27 visit.  Another stent placement and then got RSV.  30 Day Review.  Continue with ITP.  Jeryn is ready to return to the program. She showed up today with a large bruise behind her left knee and calf.  She was sent home to rest the leg and return when the bruise is resolved and not hurting her.  Levenia came to class with swelling at her right ankle.  She had recently been for swelling and tightness in the calf of the same leg. She was sent straight to her PMD for assessment.  Ardena called today to share that her PMD is not sure what is causing the swelling.  Does not think is a bloodclot.  Might be a cyst that  has ruptured and needs to be absorbed.  She is staying out of class until  her leg is better and she has the OK from her PMD to return to exercise.  30 Day Review. Continue with the ITP.  Has been out most of this month with problems with her leg.     08/12/15 1445           ITP Comments Kaly Mcquary stopped by to show me her right hand hematoma. She is unable to attend Cardiac Rehab recently due to this. Her leg Baker's Cyst is better but now her problem is her right hand. Brettany reports she has seen Dr. Caryl Comes twice about this and Dr. Vinetta Bergamo once. Ceasia said she is suppose to see DR. Callwood on Tuesday and that she is still suppose to take her [place] for her heart.           Comments:

## 2015-08-12 NOTE — Telephone Encounter (Signed)
Monique Burns stopped by to show me her right hand hematoma. She is unable to attend Cardiac Rehab recently due to this. Her leg Baker's Cyst is better but now her problem is her right hand. Monique Burns reports she has seen Dr. Caryl Comes twice about this and Dr. Vinetta Bergamo once. Monique Burns said she is suppose to see Dr. Clayborn Bigness on Tuesday and that she is still suppose to take her [employer]ker for her heart

## 2015-08-18 ENCOUNTER — Ambulatory Visit: Payer: Medicare Other | Admitting: Occupational Therapy

## 2015-08-18 DIAGNOSIS — R600 Localized edema: Secondary | ICD-10-CM | POA: Diagnosis not present

## 2015-08-18 DIAGNOSIS — M79642 Pain in left hand: Secondary | ICD-10-CM

## 2015-08-18 DIAGNOSIS — M25642 Stiffness of left hand, not elsewhere classified: Secondary | ICD-10-CM

## 2015-08-18 DIAGNOSIS — M6281 Muscle weakness (generalized): Secondary | ICD-10-CM

## 2015-08-18 NOTE — Patient Instructions (Addendum)
  Pt ed on using flexion glove at home prior to ROM  Showed increase ROM after wearing it  2 x 2 min  Add to HEP  Foam for bandaging - cut in small 2 grooves

## 2015-08-18 NOTE — Therapy (Signed)
[facility] [address] [place], [address] Phone: [phone]   Fax:  [phone]  Occupational Therapy Treatment  Patient Details  Name: Monique Burns MRN: [id number] Date of Birth: 02/06/1931 Referring Provider: Caryl Comes   Encounter Date: 08/18/2015      OT End of Session - 08/18/15 [id number]    Visit Number 4   Number of Visits 12   Date for OT Re-Evaluation 09/16/15   OT Start Time 0850   OT Stop Time 0930   OT Time Calculation (min) 40 min   Activity Tolerance Patient tolerated treatment well   Behavior During Therapy [facility] for tasks assessed/performed      Past Medical History  Diagnosis Date  . Hypertension   . Renal disorder   . Arthritis   . Thyroid disease   . Hyperlipidemia   . Leukopenia   . MI (myocardial infarction) [facility])     Past Surgical History  Procedure Laterality Date  . Coronary angioplasty with stent placement      There were no vitals filed for this visit.      Subjective Assessment - 08/18/15 0856    Subjective  I worked it this weekend - seen the cardiologist - he took my off the baby aspiriin - still really sore but hard time bending the fingers - really tight    Patient Stated Goals Want to get back my hand to normal    Currently in Pain? Yes   Pain Score 6    Pain Location Hand   Pain Orientation Left   Pain Descriptors / Indicators Aching            [facility] OT Assessment - 08/18/15 0001    Left Hand AROM   L Index  MCP 0-90 60 Degrees   L Long  MCP 0-90 60 Degrees   L Ring  MCP 0-90 65 Degrees   L Little  MCP 0-90 60 Degrees                  OT Treatments/Exercises (OP) - 08/18/15 0001    LUE Fluidotherapy   Number Minutes Fluidotherapy 10 Minutes   LUE Fluidotherapy Location Hand   Comments AROM for digits flexion and extention - at [facility] to increase ROM and decrease pain        Flexion glove fabricated  For pt to use - pt not doing well  with PROM for composite fist  Pt ed on using at home prior to ROM  Showed increase ROM after wearing it  2 x 2 min  Add to HEP   Manual therapy - fibrotic tech , compression , massage around and over area of 4cm by 4 cm - area getting smaller   As well as MLD distal to proximal  Tendon glides - AROM afterwards           OT Education - 08/18/15 1643    Education provided Yes   Education Details HEP   Person(s) Educated Patient   Methods Explanation;Verbal cues;Demonstration;Tactile cues;Handout   Comprehension Returned demonstration;Verbalized understanding          OT Short Term Goals - 08/05/15 1410    OT SHORT TERM GOAL #1   Title Edema over  dorsal L hand decrease by 1 cm to  have decrease pain on PRWHE by at least 15 points    Baseline Pain 32/50 on PRWHE   Time 3   Period Weeks  Status New   OT SHORT TERM GOAL #2   Title AROM in all L digits improve for pt to touch palm  to hold on to objects in ADL's and IADLs   Baseline Function on PRWHE is 29/50   Time 4   Period Weeks   Status New   OT SHORT TERM GOAL #3   Title Pt to be ind in HEP for edema control , increase ROM and strength    Baseline no knowledge    Time 4   Period Weeks   Status New           OT Long Term Goals - 08/05/15 1357    OT LONG TERM GOAL #1   Title Grip strength improve in L hand to  at least 50% compare to R to  carry 5 lbs objects   Baseline grip NT - pt unable to touch palm    Time 6   Period Weeks   Status New   OT LONG TERM GOAL #2   Title Pt Functional use of L hand In ADL's improve  on PRWHE function score by 15 points    Baseline Function score 29/50    Time 6   Period Weeks   Status New               Plan - 08/18/15 1644    Clinical Impression Statement Pt swelling getting smaller on dorsal hand - but little tighter and fibrotic - did change density of foam for bandaging - pt showed increase ROM after use of flexion glove - add to HEP prior to AROM     Rehab Potential Good   OT Frequency 2x / week   OT Duration 4 weeks   OT Treatment/Interventions Self-care/ADL training;Contrast Bath;Therapeutic exercise;Patient/family education;Manual Therapy;Manual lymph drainage;Compression bandaging;Passive range of motion   Plan assess use of flexion glove and progress in ROM , swelling    OT Home Exercise Plan see pt instruction    Consulted and Agree with Plan of Care Patient      Patient will benefit from skilled therapeutic intervention in order to improve the following deficits and impairments:  Impaired flexibility, Decreased range of motion, Decreased coordination, Increased edema, Pain, Impaired UE functional use, Decreased strength  Visit Diagnosis: Stiffness of left hand, not elsewhere classified  Localized edema  Pain in left hand  Muscle weakness (generalized)    Problem List Patient Active Problem List   Diagnosis Date Noted  . Chronic kidney disease 04/05/2015  . HLD (hyperlipidemia) 04/05/2015  . BP (high blood pressure) 04/05/2015  . Decreased leukocytes 04/05/2015  . Arthritis, degenerative 04/05/2015  . Disease of thyroid gland 04/05/2015  . Acute myocardial infarction of anterior wall ([place]) 02/28/2015  . Degenerative arthritis of hip 10/31/2013    Rosalyn Gess OTR/L;CLT  08/18/2015, 4:47 PM  [facility] [address] [place], [address] Phone: [phone]   Fax:  [phone]  Name: Monique Burns MRN: [id number] Date of Birth: [birth year]

## 2015-08-20 ENCOUNTER — Ambulatory Visit: Payer: Medicare Other | Admitting: Occupational Therapy

## 2015-08-20 DIAGNOSIS — R600 Localized edema: Secondary | ICD-10-CM

## 2015-08-20 DIAGNOSIS — M6281 Muscle weakness (generalized): Secondary | ICD-10-CM

## 2015-08-20 DIAGNOSIS — M79642 Pain in left hand: Secondary | ICD-10-CM

## 2015-08-20 DIAGNOSIS — M25642 Stiffness of left hand, not elsewhere classified: Secondary | ICD-10-CM

## 2015-08-20 NOTE — Patient Instructions (Signed)
Soft tissue mobs to dorsal hand around area of swelling - use piece of coban for more traction - and did some fibrotic tech and MLD distal to proximal  Did it at rest and during fisting   PROM and hold for 2 min into composite flexion stretch - x 2  AROM to 2 cm foam block  10 reps  AROM to 2 cm foam block - showed increase MC flexion  Fisting with teal putty 12 reps  Pain increase to 6/10 during PROM  Rest 4/10   Tapping of digits into extenion - needed AAROM and PROM for 3rd and 4th

## 2015-08-20 NOTE — Therapy (Signed)
[facility] [address] [place], [address] Phone: [phone]   Fax:  [phone]  Occupational Therapy Treatment  Patient Details  Name: Monique Burns MRN: [id number] Date of Birth: 1930/08/05 Referring Provider: Caryl Comes   Encounter Date: 08/20/2015      OT End of Session - 08/20/15 0906    Visit Number 5   Number of Visits 12   Date for OT Re-Evaluation 09/16/15   OT Start Time 0855   OT Stop Time 0950   OT Time Calculation (min) 55 min   Activity Tolerance Patient tolerated treatment well   Behavior During Therapy [facility] for tasks assessed/performed      Past Medical History  Diagnosis Date  . Hypertension   . Renal disorder   . Arthritis   . Thyroid disease   . Hyperlipidemia   . Leukopenia   . MI (myocardial infarction) [facility])     Past Surgical History  Procedure Laterality Date  . Coronary angioplasty with stent placement      There were no vitals filed for this visit.      Subjective Assessment - 08/20/15 0904    Subjective  I think the swelling is down some more - it is morning so my fingers are stiff - but they bend more after I take that glove off    Patient Stated Goals Want to get back my hand to normal    Currently in Pain? Yes   Pain Score 4    Pain Location Hand   Pain Orientation Left   Pain Descriptors / Indicators Aching                      OT Treatments/Exercises (OP) - 08/20/15 0001    LUE Fluidotherapy   Number Minutes Fluidotherapy 10 Minutes   LUE Fluidotherapy Location Hand   Comments AROM at [facility] for digits and wrist- with sueezing foamblock the last 2 min to increase flexion at digits and decrease pain       Soft tissue mobs to dorsal hand around area of swelling - use piece of coban for more traction - and did some fibrotic tech and MLD distal to proximal  Did it at rest and during fisting   PROM and hold for 2 min into composite flexion  stretch - x 2  AROM to 2 cm foam block  10 reps  AROM to 2 cm foam block - showed increase MC flexion  Fisting with teal putty 12 reps  Pain increase to 6/10 during PROM  Rest 4/10   Tapping of digits into extenion - needed AAROM and PROM for 3rd and 4th           OT Education - 08/20/15 0906    Education provided Yes   Education Details HEP   Person(s) Educated Patient   Methods Explanation;Demonstration;Tactile cues;Verbal cues   Comprehension Verbal cues required;Returned demonstration;Verbalized understanding          OT Short Term Goals - 08/05/15 1410    OT SHORT TERM GOAL #1   Title Edema over  dorsal L hand decrease by 1 cm to  have decrease pain on PRWHE by at least 15 points    Baseline Pain 32/50 on PRWHE   Time 3   Period Weeks   Status New   OT SHORT TERM GOAL #2   Title AROM in all L digits improve for pt to touch palm  to  hold on to objects in ADL's and IADLs   Baseline Function on PRWHE is 29/50   Time 4   Period Weeks   Status New   OT SHORT TERM GOAL #3   Title Pt to be ind in HEP for edema control , increase ROM and strength    Baseline no knowledge    Time 4   Period Weeks   Status New           OT Long Term Goals - 08/05/15 1357    OT LONG TERM GOAL #1   Title Grip strength improve in L hand to  at least 50% compare to R to  carry 5 lbs objects   Baseline grip NT - pt unable to touch palm    Time 6   Period Weeks   Status New   OT LONG TERM GOAL #2   Title Pt Functional use of L hand In ADL's improve  on PRWHE function score by 15 points    Baseline Function score 29/50    Time 6   Period Weeks   Status New               Plan - 08/20/15 0907    Clinical Impression Statement Pt showed increase flexion this date at [facility] - did parafin moist heat at [facility] - and aread of swelling get smaller - respoded good to session this date - showed increase fist to 2 cm foam block and increase 3rd digit extention    Rehab Potential Good    OT Frequency 2x / week   OT Duration 4 weeks   OT Treatment/Interventions Self-care/ADL training;Contrast Bath;Therapeutic exercise;Patient/family education;Manual Therapy;Manual lymph drainage;Compression bandaging;Passive range of motion   Plan assess progrss and upgrade    OT Home Exercise Plan see pt instruction    Consulted and Agree with Plan of Care Patient      Patient will benefit from skilled therapeutic intervention in order to improve the following deficits and impairments:  Impaired flexibility, Decreased range of motion, Decreased coordination, Increased edema, Pain, Impaired UE functional use, Decreased strength  Visit Diagnosis: Stiffness of left hand, not elsewhere classified  Localized edema  Pain in left hand  Muscle weakness (generalized)    Problem List Patient Active Problem List   Diagnosis Date Noted  . Chronic kidney disease 04/05/2015  . HLD (hyperlipidemia) 04/05/2015  . BP (high blood pressure) 04/05/2015  . Decreased leukocytes 04/05/2015  . Arthritis, degenerative 04/05/2015  . Disease of thyroid gland 04/05/2015  . Acute myocardial infarction of anterior wall ([place]) 02/28/2015  . Degenerative arthritis of hip 10/31/2013    Rosalyn Gess OTR/L,CLT  08/20/2015, 12:43 PM  [facility] [address] [place], [address] Phone: [phone]   Fax:  [phone]  Name: Monique Burns MRN: [id number] Date of Birth: [birth year]

## 2015-08-24 ENCOUNTER — Encounter: Payer: Medicare Other | Admitting: Occupational Therapy

## 2015-08-24 ENCOUNTER — Other Ambulatory Visit: Payer: Medicare Other

## 2015-08-24 ENCOUNTER — Encounter: Payer: Self-pay | Admitting: *Deleted

## 2015-08-24 NOTE — Patient Instructions (Signed)
  Your procedure is scheduled on: 08-26-15  Report to [facility] To find out your arrival time please call [phone] between 1PM - 3PM on 08-25-15  Remember: Instructions that are not followed completely may result in serious medical risk, up to and including death, or upon the discretion of your surgeon and anesthesiologist your surgery may need to be rescheduled.    _X___ 1. Do not eat food or drink liquids after midnight. No gum chewing or hard candies.     _X___ 2. No Alcohol for 24 hours before or after surgery.   ____ 3. Bring all medications with you on the day of surgery if instructed.    ____ 4. Notify your doctor if there is any change in your medical condition     (cold, fever, infections).     Do not wear jewelry, make-up, hairpins, clips or nail polish.  Do not wear lotions, powders, or perfumes. You may wear deodorant.  Do not shave 48 hours prior to surgery. Men may shave face and neck.  Do not bring valuables to the hospital.    [facility] is not responsible for any belongings or valuables.               Contacts, dentures or bridgework may not be worn into surgery.  Leave your suitcase in the car. After surgery it may be brought to your room.  For patients admitted to the hospital, discharge time is determined by your treatment team.   Patients discharged the day of surgery will not be allowed to drive home.   Please read over the following fact sheets that you were given:    __X__ Take these medicines the morning of surgery with A SIP OF WATER:    1. ZANTAC  2. CYMBALTA  3.   4.  5.  6.  ____ Fleet Enema (as directed)   ____ Use CHG Soap as directed  ____ Use inhalers on the day of surgery  ____ Stop metformin 2 days prior to surgery    ____ Take 1/2 of usual insulin dose the night before surgery and none on the morning of surgery.   _X___ Stop Coumadin/Plavix/aspirin-PT STOPPED ASA ON 08-16-15 PER PT PER DR  CALLWOOD-PT TO STOP BRILINTA 24 HOURS PRIOR TO SURGERY PER PT(DR MENZ TOLD PT THIS IN OFFICE)  ____ Stop Anti-inflammatories-NO NSAIDS OR ASA PRODUCTS-PERCOCET OK TO CONTINUE   _X___ Stop supplements until after surgery-STOP B COMPLEX NOW   ____ Bring C-Pap to the hospital.

## 2015-08-24 NOTE — Pre-Procedure Instructions (Signed)
[place], [place] [address]  [phone] [facility]) [phone] (Mobile)  English (Preferred) White / Not Hispanic or Latino  Reason for Visit Reason Comments  Follow-up CATH AND STENT REPLACEMENT ON 05/06/2015  Encounter Details Date Type Department Care Team Description  05/12/2015 Office Visit [facility]  [address] [place], [place] [address]  [phone]  [name], MD  [place], [place] [address]  [phone]  [phone] (Fax)  Unstable angina [facility]) (Primary Dx);AP (angina pectoris) [facility]);Essential hypertension;CAD in native artery;Hyperlipidemia, mixed;Thyroid disease;CRI (chronic renal insufficiency), stage 2 (mild);Murmur;Borderline hypertension;Preoperative clearance;S/P drug eluting coronary stent placement  Social History - as of this encounter Tobacco Use Types Packs/Day Years Used Date  Never Smoker      Smokeless Tobacco: Never Used      Alcohol Use Drinks/Week oz/Week Comments  No 0 Standard drinks or equivalent  0.0     Birth Sex Date Recorded  Unknown   Last Filed Vital Signs - in this encounter Vital Sign Reading Time Taken  Blood Pressure 150/60 05/12/2015 12:10 PM EST  Pulse 94 05/12/2015 12:10 PM EST  Temperature - -  Respiratory Rate - -  Oxygen Saturation - -  Inhaled Oxygen Concentration - -  Weight 53.1 kg (117 lb) 05/12/2015 12:10 PM EST  Height 170.2 cm (5\' 7" ) 05/12/2015 12:10 PM EST  Body Mass Index 18.32 05/12/2015 12:10 PM EST  Functional Status - as of this encounter Functional Status Response Date of Assessment  Are you deaf or do you have serious difficulty hearing? No 05/06/2015  Are you blind or do you have serious difficulty seeing, even when wearing glasses? No 05/06/2015  Do you have serious difficulty walking or climbing stairs? (54 years old or older) Yes - cardiac rehab and 1 year out from hip replacement 05/06/2015   Do you have difficulty dressing or bathing? (39 years old or older) No 05/06/2015  Because of a physical, mental, or emotional condition, do you have difficulty doing errands alone such as visiting a doctor's office or shopping? (48 years old or older) No 05/06/2015   Cognitive Status Response Date of Assessment  Because of a physical, mental, or emotional condition, do you have serious difficulty concentrating, remembering, or making decisions? (54 years old or older) No 05/06/2015  Progress Notes - in this encounter [name], MD - 05/12/2015 11:30 AM EST Formatting of this note may be different from the original. Established Patient Visit   Chief Complaint: Chief Complaint  Patient presents with  . Follow-up  CATH AND STENT REPLACEMENT ON 05/06/2015  Date of Service: 05/12/2015 Date of Birth: 05-13-1930 PCP: Cheryll Cockayne, MD  History of Present Illness: Ms. Gebhard is a 82 y.o.female patient who presents in follow-up from recent admission for unstable angina subtle EKG changes subsequently transferred to do underwent PCI and stent May 06, 2015 for InStent restenosis. Patient originally had a STEMI in February 28, 2015 and went to [facility] and had a DES placed in the LAD. Patient now feels much improved reduce chest pressure and tightness but developed a URI with cough congestion sneezing was diagnosed with RSV at [facility]. Patient states she had a mild complication with groin bleeding while in the hospital at [facility] . Patient had to have additional attention to groin to stop bleeding .patient is had congestion cough no fever chills or sweats minimal shortness of breath.  Past Medical and Surgical History  Past Medical  History Past Medical History  Diagnosis Date  . Chronic kidney disease  . Hyperlipidemia  . Hypertension  . Leukopenia  B12 normal  . Osteoarthritis GWK  a. Bilateral hips b. Knees  . ST elevation myocardial infarction involving left anterior descending  (LAD) coronary artery ([place]) 02/28/2015  S/p DES in LAD 10/16 at [facility]. Followed by Dr. Clayborn Bigness.  . Thyroid disease  . Ulcer [facility])   Past Surgical History She has a past surgical history that includes Hysterectomy; Eye surgery; Right total hip replacement, anterior approach (Right, 03/24/14); and Left total hip replacement (Left, 07/23/14).   Medications and Allergies  Current Medications  Current Outpatient Prescriptions  Medication Sig Dispense Refill  . ACETAMINOPHEN (TYLENOL EXTRA STRENGTH ORAL) Take 500 mg by mouth once daily as needed. prn   . aspirin 81 MG chewable tablet Take 1 tablet (81 mg total) by mouth once daily. 30 tablet 11  . atorvastatin (LIPITOR) 80 MG tablet Take 1 tablet (80 mg total) by mouth once daily. 30 tablet 11  . calcium carbonate-vitamin D3 (CALTRATE 600+D) 600 mg(1,500mg ) -200 unit tablet Take 1 tablet by mouth once daily.  . cholecalciferol (VITAMIN D3) 1,000 unit capsule Take 1,000 Units by mouth once daily.  . cycloSPORINE (RESTASIS) 0.05 % ophthalmic emulsion Place 1 drop into both eyes 2 (two) times daily. 64 vial 0  . DULoxetine (CYMBALTA) 30 MG DR capsule TAKE 1 CAPSULE BY MOUTH ONCE A DAY 90 capsule 2  . guaiFENesin (MUCINEX) 100 mg/5 mL solution Take 10 mLs (200 mg total) by mouth every 4 (four) hours as needed for Congestion for up to 10 days. 118 mL 0  . isosorbide mononitrate (IMDUR) 60 MG ER tablet Take 0.5 tablets (30 mg total) by mouth once daily. 30 tablet 0  . metoprolol succinate (TOPROL-XL) 25 MG XL tablet Take 0.5 tablets (12.5 mg total) by mouth nightly. 15 tablet 11  . nitroGLYcerin (NITROSTAT) 0.4 MG SL tablet Place 1 tablet (0.4 mg total) under the tongue as directed for Chest pain. May take up to 3 doses. 25 tablet 3  . oxyCODONE-acetaminophen (PERCOCET) 5-325 mg tablet Take 1 tablet by mouth every 6 (six) hours as needed for Pain. 60 tablet 0  . polyethylene glycol (MIRALAX) powder Take 17 g by mouth once daily. Mix in 4-8ounces of  fluid prior to taking.as needed  . ranitidine (ZANTAC) 150 MG capsule Take 150 mg by mouth 2 (two) times daily.  . sodium chloride (OCEAN) 0.65 % nasal spray Place 1 spray into both nostrils every 2 (two) hours as needed. 30 mL 0  . ticagrelor (BRILINTA) 90 mg tablet Take 1 tablet (90 mg total) by mouth every 12 (twelve) hours. 604 tablet 0   No current facility-administered medications for this visit.   Allergies: Review of patient's allergies indicates no known allergies.  Social and Family History  Social History reports that she has never smoked. She has never used smokeless tobacco. She reports that she does not drink alcohol or use illicit drugs.  Family History Family History  Problem Relation Age of Onset  . Stroke Father  . Cancer Mother  . Cancer Brother  . Cancer Son  . Parkinsonism Brother  . Testicular cancer Grandchild   Review of Systems   Review of Systems: The patient denies chest pain, shortness of breath, orthopnea, paroxysmal nocturnal dyspnea, pedal edema, palpitations, heart racing, presyncope, syncope. Review of 12 Systems is negative except as described above.  Physical Examination   Vitals: Visit Vitals  .  BP 150/60  . Pulse 94  . Ht 170.2 cm (5\' 7" )  . Wt 53.1 kg (117 lb)  . LMP (LMP Unknown)  . BMI 18.32 kg/m2   Ht:170.2 cm (5\' 7" ) Wt:53.1 kg (117 lb) [id number] surface area is 1.58 meters squared. Body mass index is 18.32 kg/(m^2).  HEENT: Pupils equally reactive to light and accomodation  Neck: Supple without thyromegaly, carotid pulses 2+ Lungs: clear to auscultation bilaterally; no wheezes, rales, rhonchi Heart: Regular rate and rhythm. No gallops, murmurs or rub Abdomen: soft nontender, nondistended, with normal bowel sounds Extremities: no cyanosis, clubbing, or edema Peripheral Pulses: 2+ in all extremities, 2+ femoral pulses bilaterally  Assessment   82 y.o. female with  1. Unstable angina (HCC)  2. AP (angina pectoris) ([place])  3.  Essential hypertension  4. CAD in native artery  5. Hyperlipidemia, mixed  6. Thyroid disease  7. CRI (chronic renal insufficiency), stage 2 (mild)  8. Murmur  9. Borderline hypertension  10. Preoperative clearance  11. S/P drug eluting coronary stent placement   Plan  1 unstable angina recent recurrent symptoms status post repeat PCI and stent for InStent restenosis 2 status post STEMI DES tear LAD at [facility] October, 2016 3 upper history tract infection thought to be RSV treated symptomatically 4 sinusitis chronic persistent recommend follow-up with ENT 5 hypertension borderline control continue metoprolol consider adding ACE-inhibitor 6 coronary disease recent STEMI now subsequent unstable angina requiring PCI and stent x2 7 chronic renal insufficiency reasonably stable outpatient follow-up with Nephrology 8 hyperlipidemia currently on Lipitor continue for lipid management 9 continue aspirin and Brilinta because of recent DES stent and unstable angina 10 referred the patient to Pulmonary for congestion shortness of breath bronchitis 11 outpatient follow-up in 3 months  Return in about 3 months (around 08/10/2015).  Yolonda Kida, MD    Plan of Treatment - as of this encounter Upcoming Encounters Upcoming Encounters  Date Type Specialty Care Team Description  08/30/2015 Post Op Orthopaedics Lauris Poag, MD  [address]  [facility] [place], [place] [address]  [phone]  [phone] (Fax)    09/08/2015 Post Op Orthopaedics Feliberto Gottron, [place]  [address]  [place], [place] [address]  [phone]  [phone] (Fax)    10/29/2015 Ancillary Orders Lab Cheryll Cockayne, MD  [address]  [facility] [place], [place] [address]  [phone]  [phone] ([phone]    11/05/2015 Office Visit Internal Medicine Cheryll Cockayne, [place]  [facility] [place], [place] [address]  [phone]  [phone] ([phone]    02/22/2016 Office Visit Cardiology [place], Montey Hora, MD  [address]  [facility] [place]  [place], [place] [address]  [phone]  [phone] (Fax)    Visit Diagnoses - in this encounter Diagnosis  Unstable angina (CMS-HCC) - Primary  Intermediate coronary syndrome   AP (angina pectoris) (CMS-HCC)  Other and unspecified angina pectoris   Essential hypertension  CAD in native artery  Hyperlipidemia, mixed  Mixed hyperlipidemia   Thyroid disease  Unspecified disorder of thyroid   CRI (chronic renal insufficiency), stage 2 (mild)  Murmur, unspecified  Undiagnosed cardiac murmurs   Borderline hypertension  Elevated blood pressure reading without diagnosis of hypertension   Preoperative clearance  Unspecified pre-operative examination   S/P drug eluting coronary stent placement  Document Information Service Providers Document Coverage Dates Jan. 04, 2017 - Jan. 05, 2017 [employer]  [facility] [phone] (Work) [place], [place] [address] Encounter Providers Dwayne Aida Raider MD (Attending) [phone] (Work) [phone] (Fax)  [place], [place] [address]

## 2015-08-24 NOTE — Pre-Procedure Instructions (Signed)
Cheryll Cockayne, MD - 08/04/2015 8:30 AM EDT Formatting of this note may be different from the original. Monique Burns is seen for left hand hematoma, coronary artery disease, osteoarthritis, hypertension, chronic kidney disease stage III, hyperlipidemia, bilateral hip replacements. Working with physical therapy; hand hematoma better. Pain in the hand is much improved. A little bit of discomfort in her elbow. Occasional left-sided chest pain, but only lasting for a few seconds. Some shortness of breath at times, but nothing near as severe as she experienced prior to stenting. Other joints are stable. Blood pressure controlled. No urinary changes. Mood doing well  Current Outpatient Prescriptions  Medication Sig Dispense Refill  . ACETAMINOPHEN (TYLENOL EXTRA STRENGTH ORAL) Take 500 mg by mouth once daily as needed. prn   . aspirin 81 MG chewable tablet Take 1 tablet (81 mg total) by mouth once daily. 30 tablet 11  . atorvastatin (LIPITOR) 80 MG tablet Take 1 tablet (80 mg total) by mouth once daily. 30 tablet 11  . calcium carbonate-vitamin D3 (CALTRATE 600+D) 600 mg(1,576m) -200 unit tablet Take 1 tablet by mouth once daily.  . cholecalciferol (VITAMIN D3) 1,000 unit capsule Take 1,000 Units by mouth once daily.  . cycloSPORINE (RESTASIS) 0.05 % ophthalmic emulsion Place 1 drop into both eyes 2 (two) times daily. 64 vial 0  . DULoxetine (CYMBALTA) 30 MG DR capsule TAKE 1 CAPSULE BY MOUTH ONCE A DAY 90 capsule 2  . isosorbide mononitrate (IMDUR) 60 MG ER tablet Take 0.5 tablets (30 mg total) by mouth once daily. 45 tablet 3  . metoprolol succinate (TOPROL-XL) 25 MG XL tablet Take 0.5 tablets (12.5 mg total) by mouth nightly. 15 tablet 11  . nitroGLYcerin (NITROSTAT) 0.4 MG SL tablet Place 1 tablet (0.4 mg total) under the tongue as directed for Chest pain. May take up to 3 doses. 25 tablet 3  . polyethylene glycol (MIRALAX) powder Take 17 g by mouth once daily. Mix in 4-8ounces of fluid  prior to taking.as needed  . ranitidine (ZANTAC) 150 MG capsule Take 150 mg by mouth 2 (two) times daily.  . ticagrelor (BRILINTA) 90 mg tablet Take 1 tablet (90 mg total) by mouth every 12 (twelve) hours. 604 tablet 0   No current facility-administered medications for this visit.   Allergies as of 08/04/2015  . (No Known Allergies)   Past Medical History  Diagnosis Date  . Chronic kidney disease  . Hyperlipidemia, unspecified  . Hypertension  . Leukopenia  B12 normal  . Osteoarthritis GWK  a. Bilateral hips b. Knees  . ST elevation myocardial infarction involving left anterior descending (LAD) coronary artery (CMS-HCC) 02/28/2015  [facility] Dr. CClayborn Bigness  . Thyroid disease  . Ulcer (CMS-HCC)   Past Surgical History  Procedure Laterality Date  . Hysterectomy  . Eye surgery  . Right total hip replacement, anterior approach Right 03/24/14  . Left total hip replacement Left 07/23/14   Health Maintenance Topics with due status: Overdue   Topic Date Due  Pneumo Low Risk Adult 06/27/2012   Family History  Problem Relation Age of Onset  . Stroke Father  . Cancer Mother  . Cancer Brother  . Cancer Son  . Parkinsonism Brother  . Testicular cancer Grandchild   Social History   Social History  . Marital status: Married  Spouse name: N/A  . Number of children: N/A  . Years of education: N/A   Social History Main Topics  . Smoking  status: Never Smoker  . Smokeless tobacco: Never Used  . Alcohol use No  . Drug use: No  . Sexual activity: No   Other Topics Concern  . None   Social History Narrative   Results for orders placed or performed in visit on 07/28/15  Lipid Panel w/calc LDL  Result Value Ref Range  Cholesterol, Total 128 100 - 200 mg/dL  Triglyceride 49 35 - 199 mg/dL  HDL (High Density Lipoprotein) Cholesterol 59.3 35.0 - 85.0 mg/dL  LDL (Low Density Lipoprotien), Calculated 59 0 - 130 mg/dL  VLDL Cholesterol 10 mg/dL   Cholesterol/HDL Ratio 2.2  Thyroid Stimulating-Hormone (TSH)  Result Value Ref Range  Thyroid Stimulating Hormone (TSH) 3.519 0.340 - 5.600 uIU/mL  Urinalysis w/Microscopic  Result Value Ref Range  Color Yellow Yellow, Straw  Clarity Cloudy (!) Clear  Specific Gravity 1.025 1.000 - 1.030  pH, Urine 5.0 5.0 - 8.0  Protein, Urinalysis Negative Negative, Trace mg/dL  Glucose, Urinalysis Negative Negative mg/dL  Ketones, Urinalysis Trace (!) Negative mg/dL  Blood, Urinalysis Negative Negative  Nitrite, Urinalysis Negative Negative  Leukocyte Esterase, Urinalysis Negative Negative  White Blood Cells, Urinalysis 4-10 (!) None Seen, 0-3 /hpf  Red Blood Cells, Urinalysis 4-10 (!) None Seen, 0-3 /hpf  Bacteria, Urinalysis Many (!) None Seen /hpf  Squamous Epithelial Cells, Urinalysis Moderate (!) Rare, Few, None Seen /hpf  CBC w/auto Differential (5 Part)  Result Value Ref Range  WBC (White Blood Cell Count) 5.5 4.1 - 10.2 10^3/uL  RBC (Red Blood Cell Count) 3.55 (L) 4.04 - 5.48 10^6/uL  Hemoglobin 10.4 (L) 12.0 - 15.0 gm/dL  Hematocrit 33.2 (L) 35.0 - 47.0 %  MCV (Mean Corpuscular Volume) 93.5 80.0 - 100.0 fl  MCH (Mean Corpuscular Hemoglobin) 29.3 27.0 - 31.2 pg  MCHC (Mean Corpuscular Hemoglobin Concentration) 31.3 (L) 32.0 - 36.0 gm/dL  Platelet Count 396 150 - 450 10^3/uL  RDW-CV (Red Cell Distribution Width) 13.2 11.6 - 14.8 %  MPV (Mean Platelet Volume) 9.2 8.0 - 10.0 fl  Neutrophils 3.57 1.50 - 7.80 10^3/uL  Lymphocytes 1.17 1.00 - 3.60 10^3/uL  Monocytes 0.62 0.00 - 1.50 10^3/uL  Eosinophils 0.09 0.00 - 0.55 10^3/uL  Basophils 0.06 0.00 - 0.09 10^3/uL  Neutrophil % 64.8 32.0 - 70.0 %  Lymphocyte % 21.2 10.0 - 50.0 %  Monocyte % 11.3 4.0 - 13.0 %  Eosinophil % 1.6 1.0 - 5.0 %  Basophil% 1.1 0.0 - 2.0 %  Immature Granulocyte % 0.0 <=0.7 %  Immature Granulocyte Count 0.00 <=0.06 10^3/L  Comprehensive Metabolic Panel (CMP)  Result Value Ref Range  Glucose 98 70 - 110  mg/dL  Sodium 141 136 - 145 mmol/L  Potassium 4.5 3.6 - 5.1 mmol/L  Chloride 106 97 - 109 mmol/L  Carbon Dioxide (CO2) 21.2 (L) 22.0 - 32.0 mmol/L  Urea Nitrogen (BUN) 26 (H) 7 - 25 mg/dL  Creatinine 1.2 (H) 0.6 - 1.1 mg/dL  Glomerular Filtration Rate (eGFR), MDRD Estimate 43 (L) >60 mL/min/1.73sq m  Calcium 9.4 8.7 - 10.3 mg/dL  AST 37 8 - 39 U/L  ALT 36 5 - 38 U/L  Alk Phos (alkaline Phosphatase) 112 (H) 34 - 104 U/L  Albumin 4.1 3.5 - 4.8 g/dL  Bilirubin, Total 0.5 0.3 - 1.2 mg/dL  Protein, Total 7.0 6.1 - 7.9 g/dL  A/G Ratio 1.4 1.0 - 5.0 gm/dL   ROS: Please see HPI; remainder of complete 10 point ROS is negative  Visit Vitals  . BP 140/60  . Pulse  72  . Wt 52.2 kg (115 lb)  . LMP (LMP Unknown)  . SpO2 99%  . BMI 18.01 kg/m2   GENERAL: The patient is alert, oriented and in no apparent distress.  HEENT: Pupils equal and reactive to light. EOMI. Oropharynx is moist without lesions.  NECK: Supple without thyromegaly; trachea midline.  CHEST: Normal to palpation; no tenderness over left side. LUNGS: Clear to auscultation without retractions or wheezing.  CARDIAC: Regular rate and rhythm, normal S1 and S2 without murmurs, rubs or gallops.  VASCULAR: Carotid and radial pulses are 2+. Distal pulses 2+.  ABDOMEN: Soft, with normal bowel sounds. No organomegaly or tenderness was found.  EXTREMITIES: Hematoma in the left hand with bruising extending toward elbow. A little bit of fluid within the elbow medially, without point tenderness. No leg edema NEUROLOGIC: Cranial nerves grossly intact. Mildly antalgic gait DERMATOLOGIC: No significant rashes or nodules.  LYMPH: No cervical or supraclavicular lymphadenopathy.  Angina pectoris, unstable (CMS-HCC) (primary encounter diagnosis) CAD in native artery Chronic diastolic CHF (congestive heart failure) (CMS-HCC) Primary osteoarthritis of left knee Essential hypertension Thyroid disease Chronic kidney disease, stage 3  (moderate) Other hyperlipidemia Hematoma Acute blood loss anemia  Assessment and Plan  1. Left hand edema/arm pain. Continue with occupational therapy. Continue elevating the hand and using the hand brace as well. The swelling in her elbow appears to be from positioning of the hand. Watch for any new episodes of bleeding, watch for any evidence of infection 2. Chronic kidney disease stage 3-stable; follow met b, u/a, microalbumin periodically. Avoid anti-inflammatories. Encourage good hydration 3. Hyperlipidemia/coronary artery disease/congestive heart failure- no evidence of fluid overload to me: Continue to monitor weight at home. Chest pains appear to be very short-lived and therefore less likely to be ischemia. She will follow-up with cardiology as planned and continue on her current regimen. Follow liver/lipids, TSH periodically  4. Anemia/leukopenia-stable; consider oral iron, though she should be able to resorb the bleeding that she has had into her hand and arm. Monitor CBC periodically.  5. Osteoarthritis-continue activity as tolerated 6. Health maintenance. Follow-up in 3 months, returning sooner if needed  [place] III, MD  Portions of this note were created using dictation software and may contain typographical errors.   Plan of Treatment - as of this encounter Upcoming Encounters Upcoming Encounters  Date Type Specialty Care Team Description  08/23/2015 Office Visit Orthopaedics Lauris Poag, MD  [address]  [facility] [place], [place] [address]  [phone]  [phone] (Fax)    10/29/2015 Ancillary Orders Lab Cheryll Cockayne, MD  [address]  [facility] [place], [place] [address]  [phone]  [phone] ([phone]    11/05/2015 Office Visit Internal Medicine Cheryll Cockayne, [place]  [facility] [place], [place] [address]  [phone]  [phone] (Fax)      Scheduled Tests Scheduled Tests  Name Priority Associated Diagnoses Order Schedule  CBC w/auto Differential (5 Part) Routine Acute blood loss anemia  Expected: 10/13/2015 (Approximate), Expires: 08/04/2016  Comprehensive Metabolic Panel (CMP) Routine CAD in native artery  Essential hypertension  Expected: 10/26/2015 (Approximate), Expires: 08/04/2016  Lipid Panel w/calc LDL Routine CAD in native artery  Essential hypertension  Expected: 10/26/2015 (Approximate), Expires: 08/04/2016  Urinalysis w/Microscopic Routine Essential hypertension  Expected: 10/13/2015 (Approximate), Expires: 08/04/2016  Microalbumin, Random Urine Routine Essential hypertension  Expected: 10/13/2015 (Approximate), Expires: 08/04/2016  Ferritin Routine Acute blood loss anemia  Expected: 10/13/2015 (Approximate), Expires: 08/03/2016  Iron Routine Acute blood loss anemia  Expected: 10/13/2015 (Approximate), Expires: 08/03/2016  Visit Diagnoses - in this encounter Diagnosis  Angina pectoris, unstable (CMS-HCC) - Primary  Intermediate coronary syndrome   CAD in native artery  Chronic diastolic CHF (congestive heart failure) (CMS-HCC)  Primary osteoarthritis of left knee  Essential hypertension  Thyroid disease  Unspecified disorder of thyroid   Chronic kidney disease, stage 3 (moderate)  Other hyperlipidemia  Hematoma  Contusion of unspecified site   Acute blood loss anemia  Acute posthemorrhagic anemia   Discontinued Medications - as of this encounter Prescription Sig. Discontinue Reason Start Date End Date  cyclobenzaprine (FLEXERIL) 5 MG tablet  TAKE 1 TABLET (5 MG TOTAL) BY MOUTH NIGHTLY AS NEEDED FOR MUSCLE SPASMS.  06/28/2015 08/04/2015  sodium chloride (OCEAN) 0.65 % nasal spray  Place 1 spray into both nostrils every 2 (two) hours as needed.  05/07/2015 08/04/2015  Document Information Service Providers Document Coverage Dates Mar. 29, 2017 - Mar. 29, 2017 [place] [phone] (Work) [place], [place] [address] Encounter Providers Cheryll Cockayne MD (Attending) [phone] (Work) [phone] (Fax)  [facility] [place], [place] [address]

## 2015-08-25 NOTE — Pre-Procedure Instructions (Signed)
RECEIVED CARDIAC CLEARANCE NOTE FROM DR CALLWOOD-LOW RISK-ON CHART

## 2015-08-25 NOTE — Pre-Procedure Instructions (Signed)
Monique Gibbon, MD - 08/17/2015 9:45 AM EDT Formatting of this note may be different from the original. Established Patient Visit   Chief Complaint: Chief Complaint  Patient presents with  . Follow-up  3 MONTHS  Date of Service: 08/17/2015 Date of Birth: 05-03-1931 PCP: Cheryll Cockayne, MD  History of Present Illness: [name] is a 82 y.o.female patient who has a history of recent cardiac problems include myocardial infarction STEMI transferred to [facility] of PCI and stent patient had repeat procedure 2 months later in December 2016. Patient has been on Brilinta and has developed multiple bruising as well as a significant hematoma of her left hand. Patient was seen in emergency room did not have evacuation was treated with local compression and elevation she has had some pain. Patient has seen physical therapy over the last 3 weeks she seen Rachelle Hora complaint of left axilla lymph node discomfort patient is planning on dental procedure in June patient scheduled to potentially hold her Brilinta prior to the dental procedure currently totally off of aspirin. Denies any buccal spells or syncope still having some minor skin bruising as well as moderate hematoma of the left hand denies any trauma.  Past Medical and Surgical History  Past Medical History Past Medical History  Diagnosis Date  . Chronic kidney disease  . Hyperlipidemia, unspecified  . Hypertension  . Leukopenia  B12 normal  . Osteoarthritis GWK  a. Bilateral hips b. Knees  . ST elevation myocardial infarction involving left anterior descending (LAD) coronary artery (CMS-HCC) 02/28/2015  S/p DES in LAD 10/16 at [facility]. Followed by Dr. Clayborn Bigness.  . Thyroid disease  . Ulcer (CMS-HCC)   Past Surgical History She has a past surgical history that includes Hysterectomy; Eye surgery; Right total hip replacement, anterior approach (Right, 03/24/14); and Left total hip replacement (Left, 07/23/14).   Medications and  Allergies  Current Medications  Current Outpatient Prescriptions  Medication Sig Dispense Refill  . ACETAMINOPHEN (TYLENOL EXTRA STRENGTH ORAL) Take 500 mg by mouth once daily as needed. prn   . atorvastatin (LIPITOR) 80 MG tablet Take 1 tablet (80 mg total) by mouth once daily. 30 tablet 11  . calcium carbonate-vitamin D3 (CALTRATE 600+D) 600 mg(1,500mg ) -200 unit tablet Take 1 tablet by mouth once daily.  . cholecalciferol (VITAMIN D3) 1,000 unit capsule Take 1,000 Units by mouth once daily.  . cycloSPORINE (RESTASIS) 0.05 % ophthalmic emulsion Place 1 drop into both eyes 2 (two) times daily. 64 vial 0  . DULoxetine (CYMBALTA) 30 MG DR capsule TAKE 1 CAPSULE BY MOUTH ONCE A DAY 90 capsule 2  . isosorbide mononitrate (IMDUR) 60 MG ER tablet Take 0.5 tablets (30 mg total) by mouth once daily. 45 tablet 3  . metoprolol succinate (TOPROL-XL) 25 MG XL tablet Take 0.5 tablets (12.5 mg total) by mouth nightly. 15 tablet 11  . nitroGLYcerin (NITROSTAT) 0.4 MG SL tablet Place 1 tablet (0.4 mg total) under the tongue as directed for Chest pain. May take up to 3 doses. 25 tablet 3  . oxyCODONE-acetaminophen (PERCOCET) 5-325 mg tablet Take 1 tablet by mouth every 6 (six) hours as needed for Pain for up to 10 days. 40 tablet 0  . polyethylene glycol (MIRALAX) powder Take 17 g by mouth once daily. Mix in 4-8ounces of fluid prior to taking.as needed  . ranitidine (ZANTAC) 150 MG capsule Take 150 mg by mouth 2 (two) times daily.  . ticagrelor (BRILINTA) 90 mg tablet Take 1 tablet (90 mg total) by  mouth every 12 (twelve) hours. 604 tablet 0   No current facility-administered medications for this visit.   Allergies: Review of patient's allergies indicates no known allergies.  Social and Family History  Social History reports that she has never smoked. She has never used smokeless tobacco. She reports that she does not drink alcohol or use illicit drugs.  Family History Family History  Problem Relation  Age of Onset  . Stroke Father  . Cancer Mother  . Cancer Brother  . Cancer Son  . Parkinsonism Brother  . Testicular cancer Grandchild   Review of Systems   Review of Systems: The patient denies chest pain, shortness of breath, orthopnea, paroxysmal nocturnal dyspnea, pedal edema, palpitations, heart racing, presyncope, syncope. Review of 12 Systems is negative except as described above.  Physical Examination   Vitals: Visit Vitals  . BP 128/60  . Pulse 64  . Ht 170.2 cm (5\' 7" )  . Wt 51.7 kg (114 lb)  . LMP (LMP Unknown)  . BMI 17.85 kg/m2   Ht:170.2 cm (5\' 7" ) Wt:51.7 kg (114 lb) [id number] surface area is 1.56 meters squared. Body mass index is 17.85 kg/(m^2).  HEENT: Pupils equally reactive to light and accomodation  Neck: Supple without thyromegaly, carotid pulses 2+ Lungs: clear to auscultation bilaterally; no wheezes, rales, rhonchi Heart: Regular rate and rhythm. No gallops, murmurs or rub Abdomen: soft nontender, nondistended, with normal bowel sounds Extremities: no cyanosis, clubbing, or edema hematoma of the hand size of an egg with ecchymosis Peripheral Pulses: 2+ in all extremities, 2+ femoral pulses bilaterally  Assessment   82 y.o. female with  1. Hematoma  2. AP (angina pectoris) (CMS-HCC)  3. Essential hypertension  4. CAD in native artery  5. Hyperlipidemia, mixed  6. Thyroid disease  7. CRI (chronic renal insufficiency), stage 2 (mild)  8. Murmur, unspecified  9. Borderline hypertension  10. S/P drug eluting coronary stent placement  11. Osteoarthritis, unspecified osteoarthritis type, unspecified site   Plan  1 hematoma of the hand probably related to anticoagulants will discontinue aspirin continue Brilinta 2 coronary disease history of myocardial infarction PCI stent 2 continue Brilinta 3 angina stable maintained on metoprolol Brilinta 4 congestive heart failure mild diastolic dysfunction continue current medical therapy with beta-blocker  consider diuretic as necessary 5 hypertension controlled continue on metoprolol therapy 6 chronic renal sufficiency stable have up with nephrology 7 hyperlipidemia continue Lipitor therapy for lipid management 8 osteoarthritis stable continue pain control as necessary consider Percocet for osteoarthritis as well as hematoma of the hand 9 history of PCI and stent on October 2013 as well as December 2013 both at [facility] 10 recommend clearance for procedure in June 11 have patient follow-up in 6 months  Return in about 6 months (around 02/16/2016).  Yolonda Kida, MD    Plan of Treatment - as of this encounter Upcoming Encounters Upcoming Encounters  Date Type Specialty Care Team Description  08/30/2015 Post Op Orthopaedics Lauris Poag, MD  [address]  [facility] [place], [place] [address]  [phone]  [phone] (Fax)    09/08/2015 Post Op Orthopaedics Feliberto Gottron, [place]  [place], [place] [address]  [phone]  [phone] (Fax)    10/29/2015 Ancillary Orders Lab Cheryll Cockayne, MD  [address]  [facility] [place], [place] [address]  [phone]  [phone] (Fax)    11/05/2015 Office Visit Internal Medicine Cheryll Cockayne, [facility]  [facility] [place], [place] [address]  [phone]  [phone] ([phone]    02/22/2016 Office Visit Cardiology Fritch, Montey Hora, MD  [address]  [facility] [place]  [place], [place] [address]  [phone]  [phone] (Fax)    Visit Diagnoses - in this encounter Diagnosis  Hematoma - Primary  Contusion of unspecified site   AP (angina pectoris) (CMS-HCC)  Other and unspecified angina pectoris   Essential hypertension  CAD in native artery  Hyperlipidemia, mixed  Mixed hyperlipidemia   Thyroid disease  Unspecified disorder of thyroid   CRI  (chronic renal insufficiency), stage 2 (mild)  Murmur, unspecified  Undiagnosed cardiac murmurs   Borderline hypertension  Elevated blood pressure reading without diagnosis of hypertension   S/P drug eluting coronary stent placement  Osteoarthritis, unspecified osteoarthritis type, unspecified site  Discontinued Medications - as of this encounter Prescription Sig. Discontinue Reason Start Date End Date  aspirin 81 MG chewable tablet  Take 1 tablet (81 mg total) by mouth once daily. Therapy completed 03/03/2015 08/17/2015  Document Information Service Providers Document Coverage Dates Apr. 11, 2017 - Apr. 18, 2017 [place] [phone] (Work) [place], [place] [address] Encounter Providers Dwayne Aida Raider MD (Attending) [phone] (Work) [phone] (Fax)  [place] [place], [place] [address]

## 2015-08-25 NOTE — Pre-Procedure Instructions (Signed)
Pt states she is to stop Brilinta 24 hours prior to surgery-she states this is what Dr Rudene Christians told her in the office

## 2015-08-26 ENCOUNTER — Ambulatory Visit: Payer: Medicare Other | Admitting: Anesthesiology

## 2015-08-26 ENCOUNTER — Encounter
Admission: RE | Disposition: A | Discharge status: Home / Self Care | Payer: Self-pay | Source: Ambulatory Visit | Attending: Orthopedic Surgery

## 2015-08-26 ENCOUNTER — Encounter: Payer: Self-pay | Admitting: *Deleted

## 2015-08-26 ENCOUNTER — Ambulatory Visit
Admission: RE | Admit: 2015-08-26 | Discharge: 2015-08-26 | Disposition: A | Discharge status: Home / Self Care | Payer: Medicare Other | Source: Ambulatory Visit | Attending: Orthopedic Surgery | Admitting: Orthopedic Surgery

## 2015-08-26 DIAGNOSIS — D72819 Decreased white blood cell count, unspecified: Secondary | ICD-10-CM | POA: Insufficient documentation

## 2015-08-26 DIAGNOSIS — E079 Disorder of thyroid, unspecified: Secondary | ICD-10-CM | POA: Diagnosis not present

## 2015-08-26 DIAGNOSIS — E785 Hyperlipidemia, unspecified: Secondary | ICD-10-CM | POA: Diagnosis not present

## 2015-08-26 DIAGNOSIS — M170 Bilateral primary osteoarthritis of knee: Secondary | ICD-10-CM | POA: Insufficient documentation

## 2015-08-26 DIAGNOSIS — M7981 Nontraumatic hematoma of soft tissue: Secondary | ICD-10-CM | POA: Insufficient documentation

## 2015-08-26 DIAGNOSIS — Z823 Family history of stroke: Secondary | ICD-10-CM | POA: Insufficient documentation

## 2015-08-26 DIAGNOSIS — Z79899 Other long term (current) drug therapy: Secondary | ICD-10-CM | POA: Diagnosis not present

## 2015-08-26 DIAGNOSIS — M160 Bilateral primary osteoarthritis of hip: Secondary | ICD-10-CM | POA: Insufficient documentation

## 2015-08-26 DIAGNOSIS — Z820 Family history of epilepsy and other diseases of the nervous system: Secondary | ICD-10-CM | POA: Diagnosis not present

## 2015-08-26 DIAGNOSIS — Z79891 Long term (current) use of opiate analgesic: Secondary | ICD-10-CM | POA: Insufficient documentation

## 2015-08-26 DIAGNOSIS — I252 Old myocardial infarction: Secondary | ICD-10-CM | POA: Diagnosis not present

## 2015-08-26 DIAGNOSIS — Z8043 Family history of malignant neoplasm of testis: Secondary | ICD-10-CM | POA: Diagnosis not present

## 2015-08-26 DIAGNOSIS — Z7902 Long term (current) use of antithrombotics/antiplatelets: Secondary | ICD-10-CM | POA: Insufficient documentation

## 2015-08-26 DIAGNOSIS — I129 Hypertensive chronic kidney disease with stage 1 through stage 4 chronic kidney disease, or unspecified chronic kidney disease: Secondary | ICD-10-CM | POA: Diagnosis not present

## 2015-08-26 DIAGNOSIS — N189 Chronic kidney disease, unspecified: Secondary | ICD-10-CM | POA: Insufficient documentation

## 2015-08-26 DIAGNOSIS — Z96643 Presence of artificial hip joint, bilateral: Secondary | ICD-10-CM | POA: Insufficient documentation

## 2015-08-26 HISTORY — DX: Headache, unspecified: R51.9

## 2015-08-26 HISTORY — DX: Malignant (primary) neoplasm, unspecified: C80.1

## 2015-08-26 HISTORY — DX: Disease of blood and blood-forming organs, unspecified: D75.9

## 2015-08-26 HISTORY — DX: Headache: R51

## 2015-08-26 HISTORY — DX: Heart failure, unspecified: I50.9

## 2015-08-26 HISTORY — DX: Gastro-esophageal reflux disease without esophagitis: K21.9

## 2015-08-26 HISTORY — DX: Reserved for inherently not codable concepts without codable children: IMO0001

## 2015-08-26 HISTORY — PX: INCISION AND DRAINAGE: SHX5863

## 2015-08-26 HISTORY — DX: Anemia, unspecified: D64.9

## 2015-08-26 SURGERY — INCISION AND DRAINAGE
Anesthesia: General | Site: Hand | Laterality: Left | Wound class: Clean

## 2015-08-26 MED ORDER — HYDROCODONE-ACETAMINOPHEN 5-325 MG PO TABS: 1.0000 | ORAL_TABLET | Freq: Four times a day (QID) | ORAL | Status: DC | PRN

## 2015-08-26 MED ORDER — BUPIVACAINE HCL (PF) 0.5 % IJ SOLN
INTRAMUSCULAR | Status: DC | PRN
  Administered 2015-08-26: 30 mL

## 2015-08-26 MED ORDER — BUPIVACAINE HCL (PF) 0.5 % IJ SOLN
INTRAMUSCULAR | Status: AC
  Filled 2015-08-26: qty 30

## 2015-08-26 MED ORDER — OXYCODONE HCL 5 MG PO TABS
ORAL_TABLET | ORAL | Status: AC
  Filled 2015-08-26: qty 1

## 2015-08-26 MED ORDER — FENTANYL CITRATE (PF) 100 MCG/2ML IJ SOLN
INTRAMUSCULAR | Status: AC
  Administered 2015-08-26: 25 ug via INTRAVENOUS
  Filled 2015-08-26: qty 2

## 2015-08-26 MED ORDER — FENTANYL CITRATE (PF) 100 MCG/2ML IJ SOLN
INTRAMUSCULAR | Status: DC | PRN
  Administered 2015-08-26: 50 ug via INTRAVENOUS

## 2015-08-26 MED ORDER — OXYCODONE HCL 5 MG/5ML PO SOLN: 5.0000 mg | Freq: Once | ORAL | Status: AC | PRN

## 2015-08-26 MED ORDER — LACTATED RINGERS IV SOLN
INTRAVENOUS | Status: DC
  Administered 2015-08-26 (×2): via INTRAVENOUS

## 2015-08-26 MED ORDER — OXYCODONE HCL 5 MG PO TABS
5.0000 mg | ORAL_TABLET | Freq: Once | ORAL | Status: AC | PRN
  Administered 2015-08-26: 5 mg via ORAL

## 2015-08-26 MED ORDER — FENTANYL CITRATE (PF) 100 MCG/2ML IJ SOLN
25.0000 ug | INTRAMUSCULAR | Status: DC | PRN
  Administered 2015-08-26 (×4): 25 ug via INTRAVENOUS

## 2015-08-26 MED ORDER — NEOMYCIN-POLYMYXIN B GU 40-200000 IR SOLN
Status: AC
  Filled 2015-08-26: qty 2

## 2015-08-26 MED ORDER — ONDANSETRON HCL 4 MG/2ML IJ SOLN
INTRAMUSCULAR | Status: DC | PRN
  Administered 2015-08-26: 4 mg via INTRAVENOUS

## 2015-08-26 MED ORDER — PROPOFOL 10 MG/ML IV BOLUS
INTRAVENOUS | Status: DC | PRN
  Administered 2015-08-26: 150 mg via INTRAVENOUS

## 2015-08-26 MED ORDER — NEOMYCIN-POLYMYXIN B GU 40-200000 IR SOLN
Status: DC | PRN
  Administered 2015-08-26: 2 mL

## 2015-08-26 MED ORDER — SUCCINYLCHOLINE CHLORIDE 20 MG/ML IJ SOLN
INTRAMUSCULAR | Status: DC | PRN
  Administered 2015-08-26: 100 mg via INTRAVENOUS

## 2015-08-26 SURGICAL SUPPLY — 24 items
BANDAGE ELASTIC 3 CLIP NS LF (GAUZE/BANDAGES/DRESSINGS) ×3 IMPLANT
BLADE SURG MINI STRL (BLADE) ×3 IMPLANT
BNDG ESMARK 4X12 TAN STRL LF (GAUZE/BANDAGES/DRESSINGS) ×3 IMPLANT
CANISTER SUCT 1200ML W/VALVE (MISCELLANEOUS) ×3 IMPLANT
CAST PADDING 3X4FT ST 30246 (SOFTGOODS) ×2
CHLORAPREP W/TINT 26ML (MISCELLANEOUS) ×3 IMPLANT
DRAIN PENROSE 1/4X12 LTX (DRAIN) ×3 IMPLANT
ELECT REM PT RETURN 9FT ADLT (ELECTROSURGICAL) ×3
ELECTRODE REM PT RTRN 9FT ADLT (ELECTROSURGICAL) ×1 IMPLANT
GAUZE PETRO XEROFOAM 1X8 (MISCELLANEOUS) ×3 IMPLANT
GAUZE SPONGE 4X4 12PLY STRL (GAUZE/BANDAGES/DRESSINGS) ×3 IMPLANT
GLOVE BIOGEL PI IND STRL 9 (GLOVE) ×1 IMPLANT
GLOVE BIOGEL PI INDICATOR 9 (GLOVE) ×2
GLOVE SURG ORTHO 9.0 STRL STRW (GLOVE) ×3 IMPLANT
GOWN STRL REUS W/ TWL LRG LVL3 (GOWN DISPOSABLE) ×1 IMPLANT
GOWN STRL REUS W/TWL LRG LVL3 (GOWN DISPOSABLE) ×2
GOWN SURG XXL (GOWNS) ×3 IMPLANT
KIT RM TURNOVER STRD PROC AR (KITS) ×3 IMPLANT
NS IRRIG 500ML POUR BTL (IV SOLUTION) ×3 IMPLANT
PACK EXTREMITY ARMC (MISCELLANEOUS) ×3 IMPLANT
PAD CAST CTTN 3X4 STRL (SOFTGOODS) ×1 IMPLANT
PAD PREP 24X41 OB/GYN DISP (PERSONAL CARE ITEMS) ×3 IMPLANT
STOCKINETTE STRL 4IN 9604848 (GAUZE/BANDAGES/DRESSINGS) ×3 IMPLANT
SUT ETHILON 4 0 P 3 18 (SUTURE) ×3 IMPLANT

## 2015-08-26 NOTE — Anesthesia Procedure Notes (Signed)
Procedure Name: Intubation Date/Time: 08/26/2015 4:11 PM Performed by: Nelda Marseille Pre-anesthesia Checklist: Patient identified, Patient being monitored, Timeout performed, Emergency Drugs available and Suction available Patient Re-evaluated:Patient Re-evaluated prior to inductionOxygen Delivery Method: Circle system utilized Preoxygenation: Pre-oxygenation with 100% oxygen Intubation Type: IV induction Ventilation: Mask ventilation without difficulty Laryngoscope Size: Mac and 3 Grade View: Grade I Tube type: Oral Tube size: 7.0 mm Number of attempts: 1 Airway Equipment and Method: Stylet Placement Confirmation: ETT inserted through vocal cords under direct vision,  positive ETCO2 and breath sounds checked- equal and bilateral Secured at: 21 cm Tube secured with: Tape Dental Injury: Teeth and Oropharynx as per pre-operative assessment

## 2015-08-26 NOTE — Anesthesia Preprocedure Evaluation (Signed)
Anesthesia Evaluation  Patient identified by MRN, date of birth, ID band Patient awake    Reviewed: Allergy & Precautions, H&P , NPO status , Patient's Chart, lab work & pertinent test results  History of Anesthesia Complications Negative for: history of anesthetic complications  Airway Mallampati: III  TM Distance: >3 FB Neck ROM: limited    Dental  (+) Poor Dentition, Chipped   Pulmonary shortness of breath and with exertion,    Pulmonary exam normal breath sounds clear to auscultation       Cardiovascular Exercise Tolerance: Good hypertension, (-) angina+ Past MI, +CHF and + DOE  Normal cardiovascular exam Rhythm:regular Rate:Normal     Neuro/Psych  Headaches, negative psych ROS   GI/Hepatic negative GI ROS, Neg liver ROS, GERD  Controlled,  Endo/Other  negative endocrine ROS  Renal/GU CRFRenal disease  negative genitourinary   Musculoskeletal  (+) Arthritis ,   Abdominal   Peds  Hematology  (+) Blood dyscrasia, ,   Anesthesia Other Findings Past Medical History:   Hypertension                                                 Arthritis                                                    Thyroid disease                                              Hyperlipidemia                                               Leukopenia                                                   GERD (gastroesophageal reflux disease)                       Headache                                                       Comment:h/o migraines   Cancer (HCC)                                                   Comment:basal cell left leg   Anemia  Shortness of breath dyspnea                                    Comment:mild compared to prior stenting-pt states she               would have to stop and rest if walking a mile   Renal disorder                                              Comment:stage 3-Dr Caryl Comes keeping a check on kidney               function   Blood dyscrasia                                                Comment:since being put on Brilinta after stent               placement developing hematoma   MI (myocardial infarction) [facility])                oct 2016     CHF (congestive heart failure) ([place])                        Past Surgical History:   CORONARY ANGIOPLASTY WITH STENT PLACEMENT                     ABDOMINAL HYSTERECTOMY                                        EYE SURGERY                                     Bilateral 2011         TONSILLECTOMY                                                   Comment:3rd grade   JOINT REPLACEMENT                               Bilateral                Comment:left 2016 and right 2015   CORONARY STENT PLACEMENT                         2016           Comment:x2  BMI    Body Mass Index   17.33 kg/m 2      Reproductive/Obstetrics negative OB ROS                             Anesthesia Physical Anesthesia Plan  ASA: III  Anesthesia Plan:  General ETT   Post-op Pain Management:    Induction:   Airway Management Planned:   Additional Equipment:   Intra-op Plan:   Post-operative Plan:   Informed Consent: I have reviewed the patients History and Physical, chart, labs and discussed the procedure including the risks, benefits and alternatives for the proposed anesthesia with the patient or authorized representative who has indicated his/her understanding and acceptance.   Dental Advisory Given  Plan Discussed with: Anesthesiologist, CRNA and Surgeon  Anesthesia Plan Comments:         Anesthesia Quick Evaluation

## 2015-08-26 NOTE — Transfer of Care (Signed)
Immediate Anesthesia Transfer of Care Note  Patient: Monique Burns  Procedure(s) Performed: Procedure(s): INCISION AND DRAINAGE / HAND HEMATOMA (Left)  Patient Location: PACU  Anesthesia Type:General  Level of Consciousness: sedated  Airway & Oxygen Therapy: Patient Spontanous Breathing and Patient connected to face mask oxygen  Post-op Assessment: Report given to RN and Post -op Vital signs reviewed and stable  Post vital signs: Reviewed and stable  Last Vitals:  Filed Vitals:   08/26/15 1406  BP: 176/78  Pulse: 62  Temp: 36.7 C  Resp: 16    Complications: No apparent anesthesia complications

## 2015-08-26 NOTE — H&P (Signed)
Reviewed paper H+P, will be scanned into chart. No changes noted.  

## 2015-08-26 NOTE — Discharge Instructions (Addendum)
Keep ice on the back of the hands tonight and tomorrow. Keep dressing clean and dry. Do not remove dressing. Pain medicine as needed  AMBULATORY SURGERY  DISCHARGE INSTRUCTIONS   1) The drugs that you were given will stay in your system until tomorrow so for the next 24 hours you should not:  A) Drive an automobile B) Make any legal decisions C) Drink any alcoholic beverage   2) You may resume regular meals tomorrow.  Today it is better to start with liquids and gradually work up to solid foods.  You may eat anything you prefer, but it is better to start with liquids, then soup and crackers, and gradually work up to solid foods.   3) Please notify your doctor immediately if you have any unusual bleeding, trouble breathing, redness and pain at the surgery site, drainage, fever, or pain not relieved by medication.    4) Additional Instructions:        Please contact your physician with any problems or Same Day Surgery at [phone], Monday through Friday 6 am to 4 pm, or [place] at [facility] number at [phone].

## 2015-08-26 NOTE — Op Note (Signed)
08/26/2015  4:39 PM  PATIENT:  Monique Burns  82 y.o. female  PRE-OPERATIVE DIAGNOSIS:  SPONTANEOUS HEMATOMA LEFT HAND  POST-OPERATIVE DIAGNOSIS:  SPONTANEOUS HEMATOMA LEFT HAND  PROCEDURE:  Procedure(s): INCISION AND DRAINAGE / HAND HEMATOMA (Left)  SURGEON: Laurene Footman, MD  ASSISTANTS: None  ANESTHESIA:   general  EBL:  Total I/O In: 200 [I.V.:200] Out: 5 [Blood:5]  BLOOD ADMINISTERED:none  DRAINS: Penrose drain in the Subcutaneous layer   LOCAL MEDICATIONS USED:  NONE  SPECIMEN:  No Specimen  DISPOSITION OF SPECIMEN:  N/A  COUNTS:  YES  TOURNIQUET:    IMPLANTS: None  DICTATION: .Dragon Dictation patient brought the operating room and after adequate anesthesia was obtained, the arm was prepped and draped in sterile fashion. After appropriate patient identification and timeout procedures were completed, a small incision was made on the dorsum of the hand. Approximately 2 cm in length the subcutaneous status tissue was evaluated with a hemostat placed underneath and then directed pressure on the hematoma mostly hematoma came out as a jellylike semisolid tissue. The extensor tendons were evaluated and were intact with some scar and hematoma attached to them. After thoroughly irrigating out the hematoma there did not appear to be much in the way residual hematoma present and again extensor tendons intact the wound was then loosely closed with 4-0 nylon with a Penrose drain and subcutaneous layer to try prevent hematoma repeat formation. The wound was then dressed with Xeroform 4 x 4's web roll and an Ace wrap  PLAN OF CARE: Discharge to home after PACU  PATIENT DISPOSITION:  PACU - hemodynamically stable.

## 2015-08-27 ENCOUNTER — Encounter: Payer: Medicare Other | Admitting: Occupational Therapy

## 2015-08-27 ENCOUNTER — Encounter: Payer: Self-pay | Admitting: Orthopedic Surgery

## 2015-08-27 NOTE — Anesthesia Postprocedure Evaluation (Signed)
Anesthesia Post Note  Patient: Monique Burns  Procedure(s) Performed: Procedure(s) (LRB): INCISION AND DRAINAGE / HAND HEMATOMA (Left)  Patient location during evaluation: PACU Anesthesia Type: General Level of consciousness: awake and alert Pain management: pain level controlled Vital Signs Assessment: post-procedure vital signs reviewed and stable Respiratory status: spontaneous breathing, nonlabored ventilation, respiratory function stable and patient connected to nasal cannula oxygen Cardiovascular status: blood pressure returned to baseline and stable Postop Assessment: no signs of nausea or vomiting Anesthetic complications: no    Last Vitals:  Filed Vitals:   08/26/15 1800 08/26/15 1817  BP: 189/63 168/63  Pulse: 57 57  Temp:    Resp:      Last Pain:  Filed Vitals:   08/26/15 1833  PainSc: 6                  Martha Clan

## 2015-08-29 ENCOUNTER — Encounter: Payer: Self-pay | Admitting: *Deleted

## 2015-08-29 DIAGNOSIS — Z9861 Coronary angioplasty status: Secondary | ICD-10-CM

## 2015-08-29 DIAGNOSIS — I2102 ST elevation (STEMI) myocardial infarction involving left anterior descending coronary artery: Secondary | ICD-10-CM

## 2015-08-29 NOTE — Progress Notes (Signed)
Cardiac Individual Treatment Plan  Patient Details  Name: Monique Burns MRN: [id number] Date of Birth: [birth year] Referring Provider:    Initial Encounter Date:       Cardiac Rehab from 04/23/2015 in [facility] Cardiac and Pulmonary Rehab   Date  04/23/15      Visit Diagnosis: ST elevation (STEMI) myocardial infarction involving left anterior descending coronary artery ([place])  S/P PTCA (percutaneous transluminal coronary angioplasty)  Patient's Home Medications on Admission:  Current outpatient prescriptions:  .  aspirin 81 MG chewable tablet, Chew by mouth., Disp: , Rfl:  .  atorvastatin (LIPITOR) 80 MG tablet, Take 80 mg by mouth daily at 6 PM. , Disp: , Rfl:  .  B Complex-Biotin-FA (BALANCED B-100 PO), Take 1 tablet by mouth daily., Disp: , Rfl:  .  calcium-vitamin D (OSCAL WITH D) 500-200 MG-UNIT tablet, Take 1 tablet by mouth daily., Disp: , Rfl:  .  Cholecalciferol (VITAMIN D3) 1000 UNITS CAPS, Take 1 tablet by mouth daily., Disp: , Rfl:  .  cycloSPORINE (RESTASIS) 0.05 % ophthalmic emulsion, Place 1 drop into both eyes 2 (two) times daily. , Disp: , Rfl:  .  DULoxetine (CYMBALTA) 30 MG capsule, Take 30 mg by mouth every morning. , Disp: , Rfl: 2 .  HYDROcodone-acetaminophen (NORCO) 5-325 MG tablet, Take 1 tablet by mouth every 6 (six) hours as needed for moderate pain., Disp: 15 tablet, Rfl: 0 .  isosorbide mononitrate (IMDUR) 60 MG 24 hr tablet, Take 30 mg by mouth at bedtime. Taking half a tablet per md instructions, Disp: , Rfl:  .  metoprolol succinate (TOPROL-XL) 25 MG 24 hr tablet, Take 0.5 tablets by mouth at bedtime. , Disp: , Rfl:  .  nitroGLYCERIN (NITROSTAT) 0.4 MG SL tablet, Place 0.4 mg under the tongue every 5 (five) minutes as needed. , Disp: , Rfl:  .  oxyCODONE-acetaminophen (PERCOCET/ROXICET) 5-325 MG tablet, Take 0.5 tablets by mouth every 4 (four) hours as needed for severe pain., Disp: , Rfl:  .  polyethylene glycol powder (GLYCOLAX/MIRALAX) powder, Take by  mouth., Disp: , Rfl:  .  ranitidine (ZANTAC) 150 MG capsule, Take 1 capsule by mouth 2 (two) times daily., Disp: , Rfl:  .  ticagrelor (BRILINTA) 90 MG TABS tablet, Take 90 mg by mouth 2 (two) times daily., Disp: , Rfl:   Past Medical History: Past Medical History  Diagnosis Date  . Hypertension   . Arthritis   . Thyroid disease   . Hyperlipidemia   . Leukopenia   . GERD (gastroesophageal reflux disease)   . Headache     h/o migraines  . Cancer ([place])     basal cell left leg  . Anemia   . Shortness of breath dyspnea     mild compared to prior stenting-pt states she would have to stop and rest if walking a mile  . Renal disorder     stage 3-Dr Caryl Comes keeping a check on kidney function  . Blood dyscrasia     since being put on Brilinta after stent placement developing hematoma  . MI (myocardial infarction) ([place]) oct 2016  . CHF (congestive heart failure) (H[facility])     Tobacco Use: History  Smoking status  . Never Smoker   Smokeless tobacco  . Not on file    Labs: Recent Review Flowsheet Data    There is no flowsheet data to display.       Exercise Target Goals:    Exercise Program Goal: Individual exercise prescription set with THRR,  safety & activity barriers. Participant demonstrates ability to understand and report RPE using BORG scale, to self-measure pulse accurately, and to acknowledge the importance of the exercise prescription.  Exercise Prescription Goal: Starting with aerobic activity 30 plus minutes a day, 3 days per week for initial exercise prescription. Provide home exercise prescription and guidelines that participant acknowledges understanding prior to discharge.  Activity Barriers & Risk Stratification:     Activity Barriers & Cardiac Risk Stratification - 04/05/15 1323    Activity Barriers & Cardiac Risk Stratification   Activity Barriers Arthritis;Right Hip Replacement;Left Hip Replacement;Deconditioning;Joint Problems   Cardiac Risk Stratification  High      6 Minute Walk:     6 Minute Walk      04/23/15 1303       6 Minute Walk   Phase Initial     Distance 815 feet     Walk Time 6 minutes     RPE 11     Symptoms Yes (comment)     Comments slight dizziness and unsteadiness      Resting HR 77 bpm     Resting BP 126/64 mmHg     Max Ex. HR 102 bpm     Max Ex. BP 140/58 mmHg        Initial Exercise Prescription:     Initial Exercise Prescription - 04/23/15 1300    Date of Initial Exercise RX and Referring Provider   Date 04/23/15   Treadmill   MPH 1.2   Grade 0   Minutes 10   Bike   Level 0.2   Watts 15   Minutes 10   Recumbant Bike   Level 2   RPM 40   Watts 15   Minutes 15   NuStep   Level 2   Watts 25   Minutes 15   Arm Ergometer   Level 1   Watts 8   Minutes 10   Arm/Foot Ergometer   Level 4   Watts 12   Minutes 10   Cybex   Level 1   RPM 50   Minutes 10   Recumbant Elliptical   Level 1   RPM 40   Watts 10   Minutes 10   REL-XR   Level 2   Watts 30   Minutes 15   T5 Nustep   Level 1   Watts 12   Minutes 15   Biostep-RELP   Level 2   Watts 15   Minutes 15   Prescription Details   Frequency (times per week) 3   Duration Progress to 30 minutes of continuous aerobic without signs/symptoms of physical distress   Intensity   THRR REST +  30   Ratings of Perceived Exertion 11-15   Progression   Progression Continue progressive overload as per policy without signs/symptoms or physical distress.   Resistance Training   Training Prescription Yes   Weight 2   Reps 10-15      Perform Capillary Blood Glucose checks as needed.  Exercise Prescription Changes:     Exercise Prescription Changes      04/27/15 0900 05/04/15 1503 05/26/15 0700 06/29/15 0700 07/08/15 0949   Exercise Review   Progression No   No  Absent since 05/04/15    Response to Exercise   Blood Pressure (Admit)  118/62 mmHg   120/54 mmHg   Blood Pressure (Exercise)  124/62 mmHg   142/64 mmHg   Blood  Pressure (Exit)  136/70 mmHg   132/68 mmHg  Heart Rate (Admit)  76 bpm   67 bpm   Heart Rate (Exercise)  99 bpm   85 bpm   Heart Rate (Exit)  75 bpm   57 bpm   Rating of Perceived Exertion (Exercise)  13      Symptoms  none      Comments First day of exercises! Patient was oriented to the equipment and exercise prescription was discussed. Patient was able to complete exercise today without signs or symptoms.   Patient as not attended since the date of the last 30 day review. Patient absent since 05/03/16, workloads will be re-evaluated upon return based on fitness and function Patient has not attending Heart Track recently related to recent onset of leg pain.  Goal is to resume Heart Track once leg difficulty is resolved.   Duration  Progress to 30 minutes of continuous aerobic without signs/symptoms of physical distress  Progress to 30 minutes of continuous aerobic without signs/symptoms of physical distress    Intensity  THRR unchanged  THRR unchanged    Progression   Progression  Continue progressive overload as per policy without signs/symptoms or physical distress.  Continue progressive overload as per policy without signs/symptoms or physical distress.    Resistance Training   Training Prescription (read-only)  Yes  Yes    Weight (read-only)  2  2    Reps (read-only)  10-15  10-15    Interval Training   Interval Training  No  No    Treadmill   MPH (read-only)  1  1    Grade (read-only)  0  0    Minutes (read-only)  15  15    NuStep   Level (read-only)  4  4    Watts (read-only)  20  20    Minutes (read-only)  15  15      07/08/15 0952           Exercise Review   Progression No  Absent since 05/04/15       Response to Exercise   Comments Patient absent since 05/03/16, workloads will be re-evaluated upon return based on fitness and function       Duration Progress to 30 minutes of continuous aerobic without signs/symptoms of physical distress       Intensity THRR unchanged        Progression   Progression Continue progressive overload as per policy without signs/symptoms or physical distress.       Interval Training   Interval Training No          Exercise Comments:     Exercise Comments      08/27/15 1145           Exercise Comments Mihaela has not attended Heart Track since 07-08-15.  Goal will be to resume Heart Track once she is able.          Discharge Exercise Prescription (Final Exercise Prescription Changes):     Exercise Prescription Changes - 07/08/15 0952    Exercise Review   Progression No  Absent since 05/04/15   Response to Exercise   Comments Patient absent since 05/03/16, workloads will be re-evaluated upon return based on fitness and function   Duration Progress to 30 minutes of continuous aerobic without signs/symptoms of physical distress   Intensity THRR unchanged   Progression   Progression Continue progressive overload as per policy without signs/symptoms or physical distress.   Interval Training   Interval Training No  Nutrition:  Target Goals: Understanding of nutrition guidelines, daily intake of sodium <1520m, cholesterol <2050m calories 30% from fat and 7% or less from saturated fats, daily to have 5 or more servings of fruits and vegetables.  Biometrics:     Pre Biometrics - 04/23/15 1301    Pre Biometrics   Height _0  (1.727 m)   Weight 120 lb 14.4 oz (54.84 kg)   Waist Circumference 27 inches   Hip Circumference 35.25 inches   Waist to Hip Ratio 0.77 %   BMI (Calculated) 18.4       Nutrition Therapy Plan and Nutrition Goals:   Nutrition Discharge: Rate Your Plate Scores:     Nutrition Assessments - 05/06/15 0937    Rate Your Plate Scores   Pre Score 72   Pre Score % 80 %      Nutrition Goals Re-Evaluation:   Psychosocial: Target Goals: Acknowledge presence or absence of depression, maximize coping skills, provide positive support system. Participant is able to verbalize types and  ability to use techniques and skills needed for reducing stress and depression.  Initial Review & Psychosocial Screening:     Initial Psych Review & Screening - 04/05/15 1341    Initial Review   Current issues with Current Stress Concerns   Source of Stress Concerns Chronic Illness;Unable to perform yard/household activities   Comments Monique Burns's husband has been diagnosed with dementia. She deals with this daily.    Family Dynamics   Good Support System? Yes   Comments Monique Burns good family support.  Children , nieces and nephews   Barriers   Psychosocial barriers to participate in program There are no identifiable barriers or psychosocial needs.;The patient should benefit from training in stress management and relaxation.   Screening Interventions   Interventions Encouraged to exercise      Quality of Life Scores:   PHQ-9:     Recent Review Flowsheet Data    [facility]   Decreased Interest 1   Down, Depressed, Hopeless 0   PHQ - 2 Score 1   Altered sleeping 1   Tired, decreased energy 2   Change in appetite 2   Feeling bad or failure about yourself  0   Trouble concentrating 0   Moving slowly or fidgety/restless 0   Suicidal thoughts 0   PHQ-9 Score 6   Difficult doing work/chores Not difficult at all      Psychosocial Evaluation and Intervention:     Psychosocial Evaluation - 05/04/15 0939    Psychosocial Evaluation & Interventions   Interventions Encouraged to exercise with the program and follow exercise prescription;Stress management education   Comments Counselor met with Ms. GaBuffinoday for initial psychosocial evaluattion.  She is an [age]ear old who had a heart attack the last week of October.  She has a strong support system with a spouse of 30 years and several daughters/daughter in laws who live close by.  She is also actively involved in her local church community.  Ms. G Monique Levelas other health issues that are stressful for her in  addition to her heart problems, she also has severe arthritis and had both hips replaced a year ago.  She has suffered multiple deaths in her life with a son and spouse and (3) brothers, but she denies a history of depression or anxiety or any current symptoms.  Ms. G Monique Leveleports relying on her faith to help her through this.  She has goals for this program to  increase her strength and stamina so she will be able to "do normal stuff like clean the house, etc, and be able to walk outside with the neighbors."  Ms. G will benefit from stress management education as she has a spouse who has dementia and this is stressful for her to care for him at this time.     Continued Psychosocial Services Needed Yes  Ms. G will benefit from stress management education to help her cope with her current stressors.      Psychosocial Re-Evaluation:     Psychosocial Re-Evaluation      08/12/15 1447           Psychosocial Re-Evaluation   Interventions Encouraged to attend Cardiac Rehabilitation for the exercise       Comments Arayah reports she has had to go to multiple doctors appts lately and she is sorry that she hasn't been able to exercise in Cardiac Rehab lately due to her right hand hematoma.           Vocational Rehabilitation: Provide vocational rehab assistance to qualifying candidates.   Vocational Rehab Evaluation & Intervention:     Vocational Rehab - 04/05/15 1324    Initial Vocational Rehab Evaluation & Intervention   Assessment shows need for Vocational Rehabilitation No      Education: Education Goals: Education classes will be provided on a weekly basis, covering required topics. Participant will state understanding/return demonstration of topics presented.  Learning Barriers/Preferences:     Learning Barriers/Preferences - 04/05/15 1323    Learning Barriers/Preferences   Learning Barriers None   Learning Preferences None      Education Topics: General Nutrition Guidelines/Fats  and Fiber: -Group instruction provided by verbal, written material, models and posters to present the general guidelines for heart healthy nutrition. Gives an explanation and review of dietary fats and fiber.   Controlling Sodium/Reading Food Labels: -Group verbal and written material supporting the discussion of sodium use in heart healthy nutrition. Review and explanation with models, verbal and written materials for utilization of the food label.   Exercise Physiology & Risk Factors: - Group verbal and written instruction with models to review the exercise physiology of the cardiovascular system and associated critical values. Details cardiovascular disease risk factors and the goals associated with each risk factor.          Cardiac Rehab from 07/08/2015 in [facility] Cardiac and Pulmonary Rehab   Date  07/06/15   Educator  BS   Instruction Review Code  2- meets goals/outcomes      Aerobic Exercise & Resistance Training: - Gives group verbal and written discussion on the health impact of inactivity. On the components of aerobic and resistive training programs and the benefits of this training and how to safely progress through these programs.      Cardiac Rehab from 07/08/2015 in [facility] Cardiac and Pulmonary Rehab   Date  07/08/15   Educator  BS   Instruction Review Code  2- meets goals/outcomes      Flexibility, Balance, General Exercise Guidelines: - Provides group verbal and written instruction on the benefits of flexibility and balance training programs. Provides general exercise guidelines with specific guidelines to those with heart or lung disease. Demonstration and skill practice provided.   Stress Management: - Provides group verbal and written instruction about the health risks of elevated stress, cause of high stress, and healthy ways to reduce stress.   Depression: - Provides group verbal and written instruction on the correlation between heart/lung  disease and depressed  mood, treatment options, and the stigmas associated with seeking treatment.      Cardiac Rehab from 07/08/2015 in [facility] Cardiac and Pulmonary Rehab   Date  07/01/15   Educator  CE   Instruction Review Code  2- meets goals/outcomes      Anatomy & Physiology of the Heart: - Group verbal and written instruction and models provide basic cardiac anatomy and physiology, with the coronary electrical and arterial systems. Review of: AMI, Angina, Valve disease, Heart Failure, Cardiac Arrhythmia, Pacemakers, and the ICD.   Cardiac Procedures: - Group verbal and written instruction and models to describe the testing methods done to diagnose heart disease. Reviews the outcomes of the test results. Describes the treatment choices: Medical Management, Angioplasty, or Coronary Bypass Surgery.      Cardiac Rehab from 07/08/2015 in [facility] Cardiac and Pulmonary Rehab   Date  04/27/15   Educator  DW   Instruction Review Code  2- meets goals/outcomes      Cardiac Medications: - Group verbal and written instruction to review commonly prescribed medications for heart disease. Reviews the medication, class of the drug, and side effects. Includes the steps to properly store meds and maintain the prescription regimen.   Go Sex-Intimacy & Heart Disease, Get SMART - Goal Setting: - Group verbal and written instruction through game format to discuss heart disease and the return to sexual intimacy. Provides group verbal and written material to discuss and apply goal setting through the application of the S.M.A.R.T. Method.      Cardiac Rehab from 07/08/2015 in [facility] Cardiac and Pulmonary Rehab   Date  04/27/15   Educator  DW   Instruction Review Code  2- meets goals/outcomes      Other Matters of the Heart: - Provides group verbal, written materials and models to describe Heart Failure, Angina, Valve Disease, and Diabetes in the realm of heart disease. Includes description of the disease process and treatment options  available to the cardiac patient.   Exercise & Equipment Safety: - Individual verbal instruction and demonstration of equipment use and safety with use of the equipment.      Cardiac Rehab from 07/08/2015 in [facility] Cardiac and Pulmonary Rehab   Date  04/05/15   Educator  SB   Instruction Review Code  2- meets goals/outcomes      Infection Prevention: - Provides verbal and written material to individual with discussion of infection control including proper hand washing and proper equipment cleaning during exercise session.      Cardiac Rehab from 07/08/2015 in [facility] Cardiac and Pulmonary Rehab   Date  04/05/15   Educator  SB   Instruction Review Code  2- meets goals/outcomes      Falls Prevention: - Provides verbal and written material to individual with discussion of falls prevention and safety.      Cardiac Rehab from 07/08/2015 in [facility] Cardiac and Pulmonary Rehab   Date  04/05/15   Educator  SB   Instruction Review Code  2- meets goals/outcomes      Diabetes: - Individual verbal and written instruction to review signs/symptoms of diabetes, desired ranges of glucose level fasting, after meals and with exercise. Advice that pre and post exercise glucose checks will be done for 3 sessions at entry of program.    Knowledge Questionnaire Score:     Knowledge Questionnaire Score - 04/05/15 1324    Knowledge Questionnaire Score   Pre Score 21/27      Core Components/Risk  Factors/Patient Goals at Admission:     Personal Goals and Risk Factors at Admission - 04/05/15 1333    Core Components/Risk Factors/Patient Goals on Admission   Sedentary Sedentary  Recently had both hip replacements.  Has pain in the left knee. Today received injection in the knee. Wants to return to her ability to clean her house  the  way she was able to prior to her hip surgeries and the heart attack..   Intervention (read-only) Provide exercise education and an individualized exercise prescription that will  provide continued progressive overload as per policy without signs/symptoms of physical distress.   Hypertension Yes   Goal Participant will see blood pressure controlled within the values of 140/72m/Hg or within value directed by their physician.   Intervention (read-only) Provide nutrition & aerobic exercise along with prescribed medications to achieve BP 140/90 or less.   Lipids Yes   Goal Cholesterol controlled with medications as prescribed, with individualized exercise RX and with personalized nutrition plan. Value goals: LDL < 779m HDL > 4033mParticipant states understanding of desired cholesterol values and following prescriptions.   Intervention (read-only) Provide nutrition & aerobic exercise along with prescribed medications to achieve LDL <8m78mDL >40mg32mStress Yes  Stress is present every day.  Angi Monique Burns she can "deal with stress"  well.  Her husband has been diagnosed with dementia.    Goal To meet with psychosocial counselor for stress and relaxation information and guidance. To state understanding of performing relaxation techniques and or identifying personal stressors.   Intervention (read-only) Provide education on types of stress, identifiying stressors, and ways to cope with stress. Provide demonstration and active practice of relaxation techniques.      Core Components/Risk Factors/Patient Goals Review:      Goals and Risk Factor Review      05/04/15 1031 08/12/15 1447         Core Components/Risk Factors/Patient Goals Review   Personal Goals Review Increase Aerobic Exercise and Physical Activity Sedentary      Review  Monique Burns by to show me her right hand hematoma. She is unable to attend Cardiac Rehab recently due to this. Her leg Baker's Cyst is better but now her problem is her right hand. Monique Burns Monique Burns she has seen Dr. KleinCaryl Comese about this and Dr. Mims Vinetta Bergamo. Monique Burns Monique Burns she is suppose to see DR. Callwood on Tuesday and that she is still suppose  to take her Brilanta for her heart      Increase Aerobic Exercise and Physical Activity (read-only)   Goals Progress/Improvement seen  Yes       Comments Patient reports she has seen an increase in energy and activity seen starting the Cardiac Rehab program.  Patient stated she was able to cook and entire meal all by herself for Christmas Eve family meal.  Patient stated she would not have been able to do this before.         Hypertension (read-only)   Goal Participant will see blood pressure controlled within the values of 140/90mm/72mr within value directed by their physician.  Patient's BP on arrival to Cardiac Rehab has ranged from 110/62 - 126/64.  BP at the end of sessions has ranged from 116/62 to 136/70.         Abnormal Lipids (read-only)   Goal Cholesterol controlled with medications as prescribed, with individualized exercise RX and with personalized nutrition plan. Value goals: LDL < 8mg, 79m> 40mg. P50mcipant states understanding of desired  cholesterol values and following prescriptions.  No new lab values available at this time.            Core Components/Risk Factors/Patient Goals at Discharge (Final Review):      Goals and Risk Factor Review - 08/12/15 1447    Core Components/Risk Factors/Patient Goals Review   Personal Goals Review Sedentary   Review Monique Burns stopped by to show me her right hand hematoma. She is unable to attend Cardiac Rehab recently due to this. Her leg Baker's Cyst is better but now her problem is her right hand. Monique Burns reports she has seen Dr. Caryl Comes twice about this and Dr. Vinetta Bergamo once. Monique Burns said she is suppose to see DR. Callwood on Tuesday and that she is still suppose to take her Brilanta for her heart      ITP Comments:     ITP Comments      04/05/15 1326 04/05/15 1328 04/23/15 1535 05/09/15 1309 05/18/15 1359   ITP Comments Initial ITP created today for review and sign off by Dr Loleta Chance. Unable to walk today Had injection in the left knee.    Monique Burns completed her functional test evaluation today.  She plans to start the sessions next week. Ready for 30 day review.  Continue with ITP  New to program ,has attended 3 sessions. Spoke with Monique Burns today. She had to have another stent and will be out until released to return to the program.     05/26/15 0715 06/03/15 1255 06/29/15 0852 07/15/15 1514 07/29/15 1443   ITP Comments Patient has not attended since the date of last review.  No changes are warranted at this time. Ready for 30 day review. Continue with ITP. HaS been absent since 12/27 visit.  Another stent placement and then got RSV.  30 Day Review.  Continue with ITP.  Monique Burns is ready to return to the program. She showed up today with a large bruise behind her left knee and calf.  She was sent home to rest the leg and return when the bruise is resolved and not hurting her.  Monique Burns came to class with swelling at her right ankle.  She had recently been for swelling and tightness in the calf of the same leg. She was sent straight to her PMD for assessment.  Monique Burns called today to share that her PMD is not sure what is causing the swelling.  Does not think is a bloodclot.  Might be a cyst that has ruptured and needs to be absorbed.  She is staying out of class until  her leg is better and she has the OK from her PMD to return to exercise.  30 Day Review. Continue with the ITP.  Has been out most of this month with problems with her leg.     08/12/15 1445 08/29/15 1006         ITP Comments Monique Burns stopped by to show me her right hand hematoma. She is unable to attend Cardiac Rehab recently due to this. Her leg Baker's Cyst is better but now her problem is her right hand. Monique Burns reports she has seen Dr. Caryl Comes twice about this and Dr. Vinetta Bergamo once. Monique Burns said she is suppose to see DR. Callwood on Tuesday and that she is still suppose to take her [place] for her heart.  30 Day review. Continue with ITP  Last visit 07/08/2015         Comments:

## 2015-09-17 ENCOUNTER — Ambulatory Visit: Payer: Medicare Other | Attending: Internal Medicine | Admitting: Occupational Therapy

## 2015-09-17 DIAGNOSIS — M6281 Muscle weakness (generalized): Secondary | ICD-10-CM | POA: Diagnosis present

## 2015-09-17 DIAGNOSIS — M25642 Stiffness of left hand, not elsewhere classified: Secondary | ICD-10-CM | POA: Diagnosis not present

## 2015-09-17 DIAGNOSIS — M79642 Pain in left hand: Secondary | ICD-10-CM | POA: Insufficient documentation

## 2015-09-17 DIAGNOSIS — R600 Localized edema: Secondary | ICD-10-CM | POA: Diagnosis present

## 2015-09-17 NOTE — Therapy (Signed)
[place] [facility] [address] [place], [address] Phone: [phone]   Fax:  [phone]  Occupational Therapy Treatment  Patient Details  Name: Monique Burns MRN: [id number] Date of Birth: Mar 28, 1931 Referring Provider: Rudene Christians  Encounter Date: 09/17/2015      OT End of Session - 09/17/15 1950    Visit Number 1   Number of Visits 8   Date for OT Re-Evaluation 10/15/15   OT Start Time 1016   OT Stop Time 1114   OT Time Calculation (min) 58 min   Activity Tolerance Patient tolerated treatment well   Behavior During Therapy [facility] for tasks assessed/performed      Past Medical History  Diagnosis Date  . Hypertension   . Arthritis   . Thyroid disease   . Hyperlipidemia   . Leukopenia   . GERD (gastroesophageal reflux disease)   . Headache     h/o migraines  . Cancer ([place])     basal cell left leg  . Anemia   . Shortness of breath dyspnea     mild compared to prior stenting-pt states she would have to stop and rest if walking a mile  . Renal disorder     stage 3-Dr Caryl Comes keeping a check on kidney function  . Blood dyscrasia     since being put on Brilinta after stent placement developing hematoma  . MI (myocardial infarction) ([place]) oct 2016  . CHF (congestive heart failure) [facility])     Past Surgical History  Procedure Laterality Date  . Coronary angioplasty with stent placement    . Abdominal hysterectomy    . Eye surgery Bilateral 2011  . Tonsillectomy      3rd grade  . Joint replacement Bilateral     left 2016 and right 2015  . Coronary stent placement  2016    x2  . Incision and drainage Left 08/26/2015    Procedure: INCISION AND DRAINAGE / HAND HEMATOMA;  Surgeon: Hessie Knows, MD;  Location: [facility] ORS;  Service: Orthopedics;  Laterality: Left;    There were no vitals filed for this visit.      Subjective Assessment - 09/17/15 1944    Subjective  I still keeping a bandaid on - but I think  my fingers are little better - what do you think- cannot make fist still , and pain with use , I am trying to use it but still ard to grip anything, hold , push up , turn    Patient Stated Goals Want to get back my hand to normal    Currently in Pain? 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 , husband has health issues, do own house work , like to [employer] at [employer] ; yard work   Pharmacist, community Grip (lbs) 35   Right Hand Lateral  Pinch 12 lbs   Right Hand 3 Point Pinch 11 lbs   Left Hand Grip (lbs) 12   Left Hand Lateral Pinch 10 lbs   Left Hand 3 Point Pinch 11 lbs   Left Hand AROM   L Index  MCP 0-90 75 Degrees   L Index PIP 0-100 100 Degrees   L Long  MCP 0-90 75 Degrees   L Long PIP 0-100 100 Degrees   L Ring  MCP 0-90 75 Degrees   L Ring PIP 0-100 95 Degrees   L Little  MCP 0-90 74 Degrees   L Little PIP 0-100 90 Degrees             HEatingpad over hand to increase ROM and decrease pain  Reviewed HEP and hand out provided              OT Education - 09/17/15 1949    Education provided Yes   Education Details HEP   Person(s) Educated Patient   Methods Explanation;Demonstration;Tactile cues;Verbal cues;Handout   Comprehension Verbal cues required;Returned demonstration;Verbalized understanding          OT Short Term Goals - 09/17/15 1955    OT SHORT TERM GOAL #1   Title Pain on PRWHE improve with at least 15 points    Baseline Pain on PRWHE is 36/50 at eval   Time 3   Period Weeks   Status New   OT  SHORT TERM GOAL #2   Title AROM in all digits improve for pt to touch palm to hold objects in ADL's and IADL's    Baseline AROM lmited in MC's more than PIP's - and extention at [address] [facility] - see flowsheet    Time 3   Period Weeks   Status New   OT SHORT TERM GOAL #3   Title Pt to be ind in HEP for scar tissue,  increase ROM and strength    Baseline limited knowledge   Time 3   Period Weeks   Status New           OT Long Term Goals - 09/17/15 2000    OT LONG TERM GOAL #1   Title Grip strength improve in L hand to  at least 50% compare to R to  carry 5 lbs objects   Baseline Grip 12 lbs L , R 35 lbs    Time 4   Period Weeks   Status New   OT LONG TERM GOAL #2   Title Pt Functional use of L hand In ADL's improve  on PRWHE function score by 15 points    Baseline Function score at eval on PRWHE 36/50   Time 4   Period Weeks   Status New               Plan - 09/17/15 1951    Clinical Impression Statement Pt had in March spontaneous hematoma - did had therapy and gain some ROM and decreaes edema - but was still lmited by fibrosis at hematoma - Dr Rudene Christians on 4/20 did incision with drainage - with great results - pt show this date increase MC flexion  compare to last visti - pt cont to be lmiited in flexoin of all digits at [facility] and PIP and decrease extention at 3rd lmiited by scar tissue on dorsal hand - decrease grip and prehension strength in L hand - pain increase with ROM to 8/10 - lmiting her functional use of L hand    Rehab  Potential Good   OT Frequency 2x / week   OT Duration 4 weeks   OT Treatment/Interventions Self-care/ADL training;Contrast Bath;Therapeutic exercise;Patient/family education;Manual Therapy;Manual lymph drainage;Compression bandaging;Passive range of motion   Plan assess how doing with HEP and improvement in ROM and pain    OT Home Exercise Plan see pt instruction    Consulted and Agree with Plan of Care Patient      Patient will benefit from skilled  therapeutic intervention in order to improve the following deficits and impairments:  Impaired flexibility, Decreased range of motion, Decreased coordination, Increased edema, Pain, Impaired UE functional use, Decreased strength  Visit Diagnosis: Stiffness of left hand, not elsewhere classified - Plan: Ot plan of care cert/re-cert  Pain in left hand - Plan: Ot plan of care cert/re-cert  Muscle weakness (generalized) - Plan: Ot plan of care cert/re-cert      G-Codes - 99991111 Aug 09, 2000    Functional Assessment Tool Used ROM , strength , PRWHE , pain , clincial judgement -   Functional Limitation Self care   Self Care Current Status [id number]) At least 40 percent but less than 60 percent impaired, limited or restricted   Self Care Goal Status [id number]) At least 1 percent but less than 20 percent impaired, limited or restricted      Problem List Patient Active Problem List   Diagnosis Date Noted  . Chronic kidney disease 04/05/2015  . HLD (hyperlipidemia) 04/05/2015  . BP (high blood pressure) 04/05/2015  . Decreased leukocytes 04/05/2015  . Arthritis, degenerative 04/05/2015  . Disease of thyroid gland 04/05/2015  . Acute myocardial infarction of anterior wall ([place]) 02/28/2015  . Degenerative arthritis of hip 10/31/2013    Rosalyn Gess OTR/L,CLT  09/17/2015, 8:05 PM  [place] [facility] Aug 09, [address] [place], [address] Phone: [phone]   Fax:  [phone]  Name: Monique Burns MRN: [id number] Date of Birth: 01-09-1931

## 2015-09-17 NOTE — Patient Instructions (Signed)
Pt to use heat  Composite fist - PROM  Tendon glides  place and hold  Teal putty for grip -end range  10 reps each

## 2015-09-20 ENCOUNTER — Ambulatory Visit: Payer: Medicare Other | Admitting: Occupational Therapy

## 2015-09-20 DIAGNOSIS — M79642 Pain in left hand: Secondary | ICD-10-CM

## 2015-09-20 DIAGNOSIS — M25642 Stiffness of left hand, not elsewhere classified: Secondary | ICD-10-CM

## 2015-09-20 DIAGNOSIS — R600 Localized edema: Secondary | ICD-10-CM

## 2015-09-20 DIAGNOSIS — M6281 Muscle weakness (generalized): Secondary | ICD-10-CM

## 2015-09-20 NOTE — Patient Instructions (Addendum)
   Scar tissue massage - mobs opposite direction with  flexion and extention of 3rd digit and add to HEP  Kinesiotape done - 20% pull either side of scar , and distal and prox to scab 100% pull - pt to do scar mobs - without getting scab off  - it can fall of itse Intrinsic fist - kept and work in and out of palm 10 reps - add to HEP     increase putty resistance add - dark blue 1/2 to teal  Pt to do 10 reps - keep thumb to side that [facility] of 2nd can increase more

## 2015-09-20 NOTE — Therapy (Signed)
[place] [facility] [address], [place], [address] Phone: [phone]   Fax:  [phone]  Occupational Therapy Treatment  Patient Details  Name: Monique Burns MRN: [id number] Date of Birth: 1930-09-29 Referring Provider: Rudene Christians  Encounter Date: 09/20/2015      OT End of Session - 09/20/15 1038    Visit Number 2   Number of Visits 8   Date for OT Re-Evaluation 10/15/15   OT Start Time 0910   OT Stop Time 1008   OT Time Calculation (min) 58 min   Activity Tolerance Patient tolerated treatment well   Behavior During Therapy [facility] for tasks assessed/performed      Past Medical History  Diagnosis Date  . Hypertension   . Arthritis   . Thyroid disease   . Hyperlipidemia   . Leukopenia   . GERD (gastroesophageal reflux disease)   . Headache     h/o migraines  . Cancer ([place])     basal cell left leg  . Anemia   . Shortness of breath dyspnea     mild compared to prior stenting-pt states she would have to stop and rest if walking a mile  . Renal disorder     stage 3-Dr Caryl Comes keeping a check on kidney function  . Blood dyscrasia     since being put on Brilinta after stent placement developing hematoma  . MI (myocardial infarction) ([place]) oct 2016  . CHF (congestive heart failure) [facility])     Past Surgical History  Procedure Laterality Date  . Coronary angioplasty with stent placement    . Abdominal hysterectomy    . Eye surgery Bilateral 2011  . Tonsillectomy      3rd grade  . Joint replacement Bilateral     left 2016 and right 2015  . Coronary stent placement  2016    x2  . Incision and drainage Left 08/26/2015    Procedure: INCISION AND DRAINAGE / HAND HEMATOMA;  Surgeon: Hessie Knows, MD;  Location: [facility] ORS;  Service: Orthopedics;  Laterality: Left;    There were no vitals filed for this visit.      Subjective Assessment - 09/20/15 0928    Subjective  My hand some days is better than other -  stiffness on and off - pinkie and ring finger hurts some - first 2 is great    Patient Stated Goals Want to get back my hand to normal    Currently in Pain? Yes   Pain Score 4    Pain Location Hand   Pain Orientation Left   Pain Descriptors / Indicators Aching   Pain Type Surgical pain            [facility] OT Assessment - 09/20/15 0001    Strength   Left Hand Grip (lbs) 16   Left Hand AROM   L Index  MCP 0-90 75 Degrees   L Index PIP 0-100 100 Degrees   L Long  MCP 0-90 8 Degrees   L Long PIP 0-100 100 Degrees   L Ring  MCP 0-90 82 Degrees   L Ring PIP 0-100 100 Degrees   L Little  MCP 0-90 85 Degrees   L Little PIP 0-100 100 Degrees       Measurement taken - good progress just from Friday   Heating pad  Around Left hand at [facility] prior to ROM - to decrease pain and increase ROM   Scar tissue massage -  mobs opposite direction with  flexion and extention of 3rd digit and add to HEP  Kinesiotape done - 20% pull either side of scar , and distal and prox to scab 100% pull - pt to do scar mobs - without getting scab off  - it can fall of itself  PROM composite fist 10 reps  PROM for MC flexion 8 reps each  AROM tendon glides 10 reps Intrinsic fist - kept and work in and out of palm 10 reps - add to HEP   Digits extention 10 reps 3rd mostly  Full fist place and hold   increase putty resistance add - dark blue 1/2 to teal  Pt to do 10 reps - keep thumb to side that [facility] of 2nd can increase more                       OT Education - 09/20/15 1038    Education Details HEP   Person(s) Educated Patient   Methods Explanation;Demonstration;Tactile cues;Verbal cues   Comprehension Verbalized understanding;Returned demonstration          OT Short Term Goals - 09/17/15 1955    OT SHORT TERM GOAL #1   Title Pain on PRWHE improve with at least 15 points    Baseline Pain on PRWHE is 36/50 at eval   Time 3   Period Weeks   Status New   OT SHORT TERM GOAL #2   Title  AROM in all digits improve for pt to touch palm to hold objects in ADL's and IADL's    Baseline AROM lmited in MC's more than PIP's - and extention at [address] [facility] - see flowsheet    Time 3   Period Weeks   Status New   OT SHORT TERM GOAL #3   Title Pt to be ind in HEP for scar tissue,  increase ROM and strength    Baseline limited knowledge   Time 3   Period Weeks   Status New           OT Long Term Goals - 09/17/15 2000    OT LONG TERM GOAL #1   Title Grip strength improve in L hand to  at least 50% compare to R to  carry 5 lbs objects   Baseline Grip 12 lbs L , R 35 lbs    Time 4   Period Weeks   Status New   OT LONG TERM GOAL #2   Title Pt Functional use of L hand In ADL's improve  on PRWHE function score by 15 points    Baseline Function score at eval on PRWHE 36/50   Time 4   Period Weeks   Status New               Plan - 09/20/15 1038    Clinical Impression Statement Pt showed again some progress over the weekend - scar tissue still adhere on dorsal hand - limiiting flexion and exteniton mostly of 3rd - but 2nd too because of thumb in palm during fisting - up HEP and add kinesiotape - but not over scab    Rehab Potential Good   OT Frequency 2x / week   OT Duration 4 weeks   OT Treatment/Interventions Contrast Bath;Therapeutic exercise;Patient/family education;Manual Therapy;Manual lymph drainage;Compression bandaging;Passive range of motion   Plan assess scartissue , progress -, ROM and strength update    OT Home Exercise Plan see pt instruction    Consulted and Agree with  Plan of Care Patient      Patient will benefit from skilled therapeutic intervention in order to improve the following deficits and impairments:  Impaired flexibility, Decreased range of motion, Decreased coordination, Increased edema, Pain, Impaired UE functional use, Decreased strength  Visit Diagnosis: Stiffness of left hand, not elsewhere classified  Pain in left hand  Muscle weakness  (generalized)  Localized edema    Problem List Patient Active Problem List   Diagnosis Date Noted  . Chronic kidney disease 04/05/2015  . HLD (hyperlipidemia) 04/05/2015  . BP (high blood pressure) 04/05/2015  . Decreased leukocytes 04/05/2015  . Arthritis, degenerative 04/05/2015  . Disease of thyroid gland 04/05/2015  . Acute myocardial infarction of anterior wall ([place]) 02/28/2015  . Degenerative arthritis of hip 10/31/2013    Rosalyn Gess OTR/L,CLT  09/20/2015, 12:32 PM  [place] [facility] [address], [place], [address] Phone: [phone]   Fax:  [phone]  Name: DAVITA FEDERICO MRN: [id number] Date of Birth: Nov 26, 1930

## 2015-09-24 ENCOUNTER — Ambulatory Visit: Payer: Medicare Other | Admitting: Occupational Therapy

## 2015-09-24 DIAGNOSIS — M79642 Pain in left hand: Secondary | ICD-10-CM

## 2015-09-24 DIAGNOSIS — M25642 Stiffness of left hand, not elsewhere classified: Secondary | ICD-10-CM

## 2015-09-24 DIAGNOSIS — M6281 Muscle weakness (generalized): Secondary | ICD-10-CM

## 2015-09-24 NOTE — Therapy (Signed)
[place] [facility] [address] [place], [address] Phone: [phone]   Fax:  [phone]  Occupational Therapy Treatment  Patient Details  Name: Monique Burns MRN: [id number] Date of Birth: 04-22-1931 Referring Provider: Rudene Christians  Encounter Date: 09/24/2015      OT End of Session - 09/24/15 1102    Visit Number 3   Number of Visits 8   Date for OT Re-Evaluation 10/15/15   OT Start Time 0910   OT Stop Time 1000   OT Time Calculation (min) 50 min   Activity Tolerance Patient tolerated treatment well   Behavior During Therapy [facility] for tasks assessed/performed      Past Medical History  Diagnosis Date  . Hypertension   . Arthritis   . Thyroid disease   . Hyperlipidemia   . Leukopenia   . GERD (gastroesophageal reflux disease)   . Headache     h/o migraines  . Cancer ([place])     basal cell left leg  . Anemia   . Shortness of breath dyspnea     mild compared to prior stenting-pt states she would have to stop and rest if walking a mile  . Renal disorder     stage 3-Dr Caryl Comes keeping a check on kidney function  . Blood dyscrasia     since being put on Brilinta after stent placement developing hematoma  . MI (myocardial infarction) ([place]) oct 2016  . CHF (congestive heart failure) [facility])     Past Surgical History  Procedure Laterality Date  . Coronary angioplasty with stent placement    . Abdominal hysterectomy    . Eye surgery Bilateral 2011  . Tonsillectomy      3rd grade  . Joint replacement Bilateral     left 2016 and right 2015  . Coronary stent placement  2016    x2  . Incision and drainage Left 08/26/2015    Procedure: INCISION AND DRAINAGE / HAND HEMATOMA;  Surgeon: Hessie Knows, MD;  Location: [facility] ORS;  Service: Orthopedics;  Laterality: Left;    There were no vitals filed for this visit.      Subjective Assessment - 09/24/15 1056    Subjective  I did not wear my glove for first time today  - is that okay - pain much better - and using it more    Patient Stated Goals Want to get back my hand to normal    Currently in Pain? Yes   Pain Score 2    Pain Location Hand   Pain Orientation Left   Pain Descriptors / Indicators Aching   Pain Type Surgical pain   Pain Onset More than a month ago            [facility] OT Assessment - 09/24/15 0001    Strength   Right Hand Grip (lbs) 35   Right Hand Lateral Pinch 12 lbs   Right Hand 3 Point Pinch 11 lbs   Left Hand Grip (lbs) 25   Left Hand Lateral Pinch 12 lbs   Left Hand 3 Point Pinch 8 lbs   Left Hand AROM   L Index  MCP 0-90 80 Degrees   L Long  MCP 0-90 85 Degrees   L Ring  MCP 0-90 90 Degrees   L Little  MCP 0-90 90 Degrees       Heating pad over hand after measured digits AROM and grip/prehension   Scar massage and mobs done in  all directions and with 3rd digit extention and opposite scar mob with coban - scab come off and scar healed  Pt  to cont scar massage - kinesiotape done 20% parallel and across scar  3  At 100% pull  Pt said it held 2 days last time   Pt AROM full composite fist done - MC's WNL  Reviewed putty - grip - need to pull down on 2nd too - keep thumb out  And lat and 3 point grip add to HEP  Needed min A - with mod v/c  Pt fitted with Oval 8 size 7 on 4th swan neck developing - ed on use and precautions                       OT Education - 09/24/15 1102    Education Details HEP   Person(s) Educated Patient   Methods Explanation;Demonstration;Tactile cues;Verbal cues   Comprehension Verbal cues required;Returned demonstration;Verbalized understanding          OT Short Term Goals - 09/17/15 1955    OT SHORT TERM GOAL #1   Title Pain on PRWHE improve with at least 15 points    Baseline Pain on PRWHE is 36/50 at eval   Time 3   Period Weeks   Status New   OT SHORT TERM GOAL #2   Title AROM in all digits improve for pt to touch palm to hold objects in ADL's and IADL's     Baseline AROM lmited in MC's more than PIP's - and extention at [address] [facility] - see flowsheet    Time 3   Period Weeks   Status New   OT SHORT TERM GOAL #3   Title Pt to be ind in HEP for scar tissue,  increase ROM and strength    Baseline limited knowledge   Time 3   Period Weeks   Status New           OT Long Term Goals - 09/17/15 2000    OT LONG TERM GOAL #1   Title Grip strength improve in L hand to  at least 50% compare to R to  carry 5 lbs objects   Baseline Grip 12 lbs L , R 35 lbs    Time 4   Period Weeks   Status New   OT LONG TERM GOAL #2   Title Pt Functional use of L hand In ADL's improve  on PRWHE function score by 15 points    Baseline Function score at eval on PRWHE 36/50   Time 4   Period Weeks   Status New               Plan - 09/24/15 1103    Clinical Impression Statement Pt showed again great progtress in AROM in digits - 2nd and 3rd MC's still 5-10 off but after heat and AROM to WNL - grip improved but prehension still some what decrease - pain improved but scar still adher and pain with that - pt do have [place]  developping at 4th PIP - fitted with oval 8 splint  nr 7  - pt ed on precautions    Rehab Potential Good   OT Frequency 2x / week   OT Duration 4 weeks   OT Treatment/Interventions Contrast Bath;Therapeutic exercise;Patient/family education;Manual Therapy;Manual lymph drainage;Compression bandaging;Passive range of motion   Plan assess progress and use - PRWHE - possible discharge    OT Home Exercise Plan see pt instruction  Consulted and Agree with Plan of Care Patient      Patient will benefit from skilled therapeutic intervention in order to improve the following deficits and impairments:  Impaired flexibility, Decreased range of motion, Decreased coordination, Increased edema, Pain, Impaired UE functional use, Decreased strength  Visit Diagnosis: Stiffness of left hand, not elsewhere classified  Pain in left hand  Muscle weakness  (generalized)    Problem List Patient Active Problem List   Diagnosis Date Noted  . Chronic kidney disease 04/05/2015  . HLD (hyperlipidemia) 04/05/2015  . BP (high blood pressure) 04/05/2015  . Decreased leukocytes 04/05/2015  . Arthritis, degenerative 04/05/2015  . Disease of thyroid gland 04/05/2015  . Acute myocardial infarction of anterior wall ([place]) 02/28/2015  . Degenerative arthritis of hip 10/31/2013    Rosalyn Gess OTR/L,CLT  09/24/2015, 11:07 AM  [place] [facility] [address] [place], [address] Phone: [phone]   Fax:  [phone]  Name: Monique Burns MRN: [id number] Date of Birth: Jul 21, 1930

## 2015-09-24 NOTE — Patient Instructions (Signed)
     Pt  to cont scar massage at home - kinesiotape done 20% parallel and across scar  3  At 100% pull  Pt said it held 2 days last time  Cont with same ROM HEP   Reviewed putty - grip - need to pull down on 2nd too - keep thumb out  And lat and 3 point grip add to HEP  Needed min A - with mod v/c

## 2015-09-26 ENCOUNTER — Telehealth: Payer: Self-pay | Admitting: *Deleted

## 2015-09-26 ENCOUNTER — Encounter: Payer: Self-pay | Admitting: *Deleted

## 2015-09-26 NOTE — Telephone Encounter (Signed)
Called to check on status to return to program. LMOM 

## 2015-09-26 NOTE — Progress Notes (Signed)
Cardiac Individual Treatment Plan  Patient Details  Name: Monique Burns MRN: [id number] Date of Birth: 1930-12-03 Referring Provider:    Initial Encounter Date:       Cardiac Rehab from 04/23/2015 in [facility] Cardiac and Pulmonary Rehab   Date  04/23/15      Visit Diagnosis: No diagnosis found.  Patient's Home Medications on Admission:  Current outpatient prescriptions:  .  aspirin 81 MG chewable tablet, Chew by mouth., Disp: , Rfl:  .  atorvastatin (LIPITOR) 80 MG tablet, Take 80 mg by mouth daily at 6 PM. , Disp: , Rfl:  .  B Complex-Biotin-FA (BALANCED B-100 PO), Take 1 tablet by mouth daily., Disp: , Rfl:  .  calcium-vitamin Monique (OSCAL WITH Monique) 500-200 MG-UNIT tablet, Take 1 tablet by mouth daily., Disp: , Rfl:  .  Cholecalciferol (VITAMIN D3) 1000 UNITS CAPS, Take 1 tablet by mouth daily., Disp: , Rfl:  .  cycloSPORINE (RESTASIS) 0.05 % ophthalmic emulsion, Place 1 drop into both eyes 2 (two) times daily. , Disp: , Rfl:  .  DULoxetine (CYMBALTA) 30 MG capsule, Take 30 mg by mouth every morning. , Disp: , Rfl: 2 .  HYDROcodone-acetaminophen (NORCO) 5-325 MG tablet, Take 1 tablet by mouth every 6 (six) hours as needed for moderate pain., Disp: 15 tablet, Rfl: 0 .  isosorbide mononitrate (IMDUR) 60 MG 24 hr tablet, Take 30 mg by mouth at bedtime. Taking half a tablet per md instructions, Disp: , Rfl:  .  metoprolol succinate (TOPROL-XL) 25 MG 24 hr tablet, Take 0.5 tablets by mouth at bedtime. , Disp: , Rfl:  .  nitroGLYCERIN (NITROSTAT) 0.4 MG SL tablet, Place 0.4 mg under the tongue every 5 (five) minutes as needed. , Disp: , Rfl:  .  oxyCODONE-acetaminophen (PERCOCET/ROXICET) 5-325 MG tablet, Take 0.5 tablets by mouth every 4 (four) hours as needed for severe pain., Disp: , Rfl:  .  polyethylene glycol powder (GLYCOLAX/MIRALAX) powder, Take by mouth., Disp: , Rfl:  .  ranitidine (ZANTAC) 150 MG capsule, Take 1 capsule by mouth 2 (two) times daily., Disp: , Rfl:  .  ticagrelor  (BRILINTA) 90 MG TABS tablet, Take 90 mg by mouth 2 (two) times daily., Disp: , Rfl:   Past Medical History: Past Medical History  Diagnosis Date  . Hypertension   . Arthritis   . Thyroid disease   . Hyperlipidemia   . Leukopenia   . GERD (gastroesophageal reflux disease)   . Headache     h/o migraines  . Cancer ([place])     basal cell left leg  . Anemia   . Shortness of breath dyspnea     mild compared to prior stenting-pt states she would have to stop and rest if walking a mile  . Renal disorder     stage 3-Dr Caryl Burns keeping a check on kidney function  . Blood dyscrasia     since being put on Brilinta after stent placement developing hematoma  . MI (myocardial infarction) ([place]) oct 2016  . CHF (congestive heart failure) ([facility])     Tobacco Use: History  Smoking status  . Never Smoker   Smokeless tobacco  . Not on file    Labs: Recent Review Flowsheet Data    There is no flowsheet data to display.       Exercise Target Goals:    Exercise Program Goal: Individual exercise prescription set with THRR, safety & activity barriers. Participant demonstrates ability to understand and report RPE using BORG scale, to  self-measure pulse accurately, and to acknowledge the importance of the exercise prescription.  Exercise Prescription Goal: Starting with aerobic activity 30 plus minutes a day, 3 days per week for initial exercise prescription. Provide home exercise prescription and guidelines that participant acknowledges understanding prior to discharge.  Activity Barriers & Risk Stratification:     Activity Barriers & Cardiac Risk Stratification - 04/05/15 1323    Activity Barriers & Cardiac Risk Stratification   Activity Barriers Arthritis;Right Hip Replacement;Left Hip Replacement;Deconditioning;Joint Problems   Cardiac Risk Stratification High      6 Minute Walk:     6 Minute Walk      04/23/15 1303       6 Minute Walk   Phase Initial     Distance 815 feet      Walk Time 6 minutes     RPE 11     Symptoms Yes (comment)     Comments slight dizziness and unsteadiness      Resting HR 77 bpm     Resting BP 126/64 mmHg     Max Ex. HR 102 bpm     Max Ex. BP 140/58 mmHg        Initial Exercise Prescription:     Initial Exercise Prescription - 04/23/15 1300    Date of Initial Exercise RX and Referring Provider   Date 04/23/15   Treadmill   MPH 1.2   Grade 0   Minutes 10   Bike   Burns 0.2   Watts 15   Minutes 10   Recumbant Bike   Burns 2   RPM 40   Watts 15   Minutes 15   NuStep   Burns 2   Watts 25   Minutes 15   Arm Ergometer   Burns 1   Watts 8   Minutes 10   Arm/Foot Ergometer   Burns 4   Watts 12   Minutes 10   Cybex   Burns 1   RPM 50   Minutes 10   Recumbant Elliptical   Burns 1   RPM 40   Watts 10   Minutes 10   REL-XR   Burns 2   Watts 30   Minutes 15   T5 Nustep   Burns 1   Watts 12   Minutes 15   Biostep-RELP   Burns 2   Watts 15   Minutes 15   Prescription Details   Frequency (times per week) 3   Duration Progress to 30 minutes of continuous aerobic without signs/symptoms of physical distress   Intensity   THRR REST +  30   Ratings of Perceived Exertion 11-15   Progression   Progression Continue progressive overload as per policy without signs/symptoms or physical distress.   Resistance Training   Training Prescription Yes   Weight 2   Reps 10-15      Perform Capillary Blood Glucose checks as needed.  Exercise Prescription Changes:     Exercise Prescription Changes      04/27/15 0900 05/04/15 1503 05/26/15 0700 06/29/15 0700 07/08/15 0949   Exercise Review   Progression No   No  Absent since 05/04/15    Response to Exercise   Blood Pressure (Admit)  118/62 mmHg   120/54 mmHg   Blood Pressure (Exercise)  124/62 mmHg   142/64 mmHg   Blood Pressure (Exit)  136/70 mmHg   132/68 mmHg   Heart Rate (Admit)  76 bpm   67 bpm   Heart Rate (Exercise)  99 bpm   85 bpm   Heart Rate  (Exit)  75 bpm   57 bpm   Rating of Perceived Exertion (Exercise)  13      Symptoms  none      Comments First day of exercises! Patient was oriented to the equipment and exercise prescription was discussed. Patient was able to complete exercise today without signs or symptoms.   Patient as not attended since the date of the last 30 day review. Patient absent since 05/03/16, workloads will be re-evaluated upon return based on fitness and function Patient has not attending Heart Track recently related to recent onset of leg pain.  Goal is to resume Heart Track once leg difficulty is resolved.   Duration  Progress to 30 minutes of continuous aerobic without signs/symptoms of physical distress  Progress to 30 minutes of continuous aerobic without signs/symptoms of physical distress    Intensity  THRR unchanged  THRR unchanged    Progression   Progression  Continue progressive overload as per policy without signs/symptoms or physical distress.  Continue progressive overload as per policy without signs/symptoms or physical distress.    Resistance Training   Training Prescription (read-only)  Yes  Yes    Weight (read-only)  2  2    Reps (read-only)  10-15  10-15    Interval Training   Interval Training  No  No    Treadmill   MPH (read-only)  1  1    Grade (read-only)  0  0    Minutes (read-only)  15  15    NuStep   Burns (read-only)  4  4    Watts (read-only)  20  20    Minutes (read-only)  15  15      07/08/15 0952           Exercise Review   Progression No  Absent since 05/04/15       Response to Exercise   Comments Patient absent since 05/03/16, workloads will be re-evaluated upon return based on fitness and function       Duration Progress to 30 minutes of continuous aerobic without signs/symptoms of physical distress       Intensity THRR unchanged       Progression   Progression Continue progressive overload as per policy without signs/symptoms or physical distress.       Interval  Training   Interval Training No          Exercise Comments:     Exercise Comments      08/27/15 1145 09/20/15 1344         Exercise Comments Monique Burns has not attended Heart Track since 07-08-15.  Goal will be to resume Heart Track once she is able. Monique Burns has not attended Heart Track since 07-08-15.         Discharge Exercise Prescription (Final Exercise Prescription Changes):     Exercise Prescription Changes - 07/08/15 0952    Exercise Review   Progression No  Absent since 05/04/15   Response to Exercise   Comments Patient absent since 05/03/16, workloads will be re-evaluated upon return based on fitness and function   Duration Progress to 30 minutes of continuous aerobic without signs/symptoms of physical distress   Intensity THRR unchanged   Progression   Progression Continue progressive overload as per policy without signs/symptoms or physical distress.   Interval Training   Interval Training No      Nutrition:  Target Goals: Understanding of nutrition guidelines,  daily intake of sodium '1500mg'$ , cholesterol '200mg'$ , calories 30% from fat and 7% or less from saturated fats, daily to have 5 or more servings of fruits and vegetables.  Biometrics:     Pre Biometrics - 04/23/15 1301    Pre Biometrics   Height '5\' 8"'$  (1.727 m)   Weight 120 lb 14.4 oz (54.84 kg)   Waist Circumference 27 inches   Hip Circumference 35.25 inches   Waist to Hip Ratio 0.77 %   BMI (Calculated) 18.4       Nutrition Therapy Plan and Nutrition Goals:   Nutrition Discharge: Rate Your Plate Scores:     Nutrition Assessments - 05/06/15 0937    Rate Your Plate Scores   Pre Score 72   Pre Score % 80 %      Nutrition Goals Re-Evaluation:   Psychosocial: Target Goals: Acknowledge presence or absence of depression, maximize coping skills, provide positive support system. Participant is able to verbalize types and ability to use techniques and skills needed for reducing stress and  depression.  Initial Review & Psychosocial Screening:     Initial Psych Review & Screening - 04/05/15 1341    Initial Review   Current issues with Current Stress Concerns   Source of Stress Concerns Chronic Illness;Unable to perform yard/household activities   Comments Monique Burns's husband has been diagnosed with dementia. She deals with this daily.    Family Dynamics   Good Support System? Yes   Comments Monique Burns has good family support.  Children , nieces and nephews   Barriers   Psychosocial barriers to participate in program There are no identifiable barriers or psychosocial needs.;The patient should benefit from training in stress management and relaxation.   Screening Interventions   Interventions Encouraged to exercise      Quality of Life Scores:   PHQ-9:     Recent Review Flowsheet Data    Depression screen [facility] 2/9 04/05/2015   Decreased Interest 1   Down, Depressed, Hopeless 0   PHQ - 2 Score 1   Altered sleeping 1   Tired, decreased energy 2   Change in appetite 2   Feeling bad or failure about yourself  0   Trouble concentrating 0   Moving slowly or fidgety/restless 0   Suicidal thoughts 0   PHQ-9 Score 6   Difficult doing work/chores Not difficult at all      Psychosocial Evaluation and Intervention:     Psychosocial Evaluation - 05/04/15 0939    Psychosocial Evaluation & Interventions   Interventions Encouraged to exercise with the program and follow exercise prescription;Stress management education   Comments Counselor met with Monique Burns today for initial psychosocial evaluattion.  She is an 82 year old who had a heart attack the last week of October.  She has a strong support system with a spouse of 30 years and several daughters/daughter in laws who live close by.  She is also actively involved in her local church community.  Monique Burns has other health issues that are stressful for her in addition to her heart problems, she also has severe arthritis and had both  hips replaced a year ago.  She has suffered multiple deaths in her life with a son and spouse and (3) brothers, but she denies a history of depression or anxiety or any current symptoms.  Monique Burns reports relying on her faith to help her through this.  She has goals for this program to increase her strength and stamina so she will  be able to "do normal stuff like clean the house, etc, and be able to walk outside with the neighbors."  Monique Burns will benefit from stress management education as she has a spouse who has dementia and this is stressful for her to care for him at this time.     Continued Psychosocial Services Needed Yes  Monique Burns will benefit from stress management education to help her cope with her current stressors.      Psychosocial Re-Evaluation:     Psychosocial Re-Evaluation      08/12/15 1447           Psychosocial Re-Evaluation   Interventions Encouraged to attend Cardiac Rehabilitation for the exercise       Comments Monique Burns reports she has had to go to multiple doctors appts lately and she is sorry that she hasn't been able to exercise in Cardiac Rehab lately due to her right hand hematoma.           Vocational Rehabilitation: Provide vocational rehab assistance to qualifying candidates.   Vocational Rehab Evaluation & Intervention:     Vocational Rehab - 04/05/15 1324    Initial Vocational Rehab Evaluation & Intervention   Assessment shows need for Vocational Rehabilitation No      Education: Education Goals: Education classes will be provided on a weekly basis, covering required topics. Participant will state understanding/return demonstration of topics presented.  Learning Barriers/Preferences:     Learning Barriers/Preferences - 04/05/15 1323    Learning Barriers/Preferences   Learning Barriers None   Learning Preferences None      Education Topics: General Nutrition Guidelines/Fats and Fiber: -Group instruction provided by verbal, written material, models  and posters to present the general guidelines for heart healthy nutrition. Gives an explanation and review of dietary fats and fiber.   Controlling Sodium/Reading Food Labels: -Group verbal and written material supporting the discussion of sodium use in heart healthy nutrition. Review and explanation with models, verbal and written materials for utilization of the food label.   Exercise Physiology & Risk Factors: - Group verbal and written instruction with models to review the exercise physiology of the cardiovascular system and associated critical values. Details cardiovascular disease risk factors and the goals associated with each risk factor.          Cardiac Rehab from 07/08/2015 in [facility] Cardiac and Pulmonary Rehab   Date  07/06/15   Educator  BS   Instruction Review Code  2- meets goals/outcomes      Aerobic Exercise & Resistance Training: - Gives group verbal and written discussion on the health impact of inactivity. On the components of aerobic and resistive training programs and the benefits of this training and how to safely progress through these programs.      Cardiac Rehab from 07/08/2015 in [facility] Cardiac and Pulmonary Rehab   Date  07/08/15   Educator  BS   Instruction Review Code  2- meets goals/outcomes      Flexibility, Balance, General Exercise Guidelines: - Provides group verbal and written instruction on the benefits of flexibility and balance training programs. Provides general exercise guidelines with specific guidelines to those with heart or lung disease. Demonstration and skill practice provided.   Stress Management: - Provides group verbal and written instruction about the health risks of elevated stress, cause of high stress, and healthy ways to reduce stress.   Depression: - Provides group verbal and written instruction on the correlation between heart/lung disease and depressed mood, treatment options, and the  stigmas associated with seeking treatment.       Cardiac Rehab from 07/08/2015 in [facility] Cardiac and Pulmonary Rehab   Date  07/01/15   Educator  CE   Instruction Review Code  2- meets goals/outcomes      Anatomy & Physiology of the Heart: - Group verbal and written instruction and models provide basic cardiac anatomy and physiology, with the coronary electrical and arterial systems. Review of: AMI, Angina, Valve disease, Heart Failure, Cardiac Arrhythmia, Pacemakers, and the ICD.   Cardiac Procedures: - Group verbal and written instruction and models to describe the testing methods done to diagnose heart disease. Reviews the outcomes of the test results. Describes the treatment choices: Medical Management, Angioplasty, or Coronary Bypass Surgery.      Cardiac Rehab from 07/08/2015 in [facility] Cardiac and Pulmonary Rehab   Date  04/27/15   Educator  DW   Instruction Review Code  2- meets goals/outcomes      Cardiac Medications: - Group verbal and written instruction to review commonly prescribed medications for heart disease. Reviews the medication, class of the drug, and side effects. Includes the steps to properly store meds and maintain the prescription regimen.   Go Sex-Intimacy & Heart Disease, Get SMART - Goal Setting: - Group verbal and written instruction through game format to discuss heart disease and the return to sexual intimacy. Provides group verbal and written material to discuss and apply goal setting through the application of the S.M.A.R.T. Method.      Cardiac Rehab from 07/08/2015 in [facility] Cardiac and Pulmonary Rehab   Date  04/27/15   Educator  DW   Instruction Review Code  2- meets goals/outcomes      Other Matters of the Heart: - Provides group verbal, written materials and models to describe Heart Failure, Angina, Valve Disease, and Diabetes in the realm of heart disease. Includes description of the disease process and treatment options available to the cardiac patient.   Exercise & Equipment Safety: - Individual  verbal instruction and demonstration of equipment use and safety with use of the equipment.      Cardiac Rehab from 07/08/2015 in [facility] Cardiac and Pulmonary Rehab   Date  04/05/15   Educator  SB   Instruction Review Code  2- meets goals/outcomes      Infection Prevention: - Provides verbal and written material to individual with discussion of infection control including proper hand washing and proper equipment cleaning during exercise session.      Cardiac Rehab from 07/08/2015 in [facility] Cardiac and Pulmonary Rehab   Date  04/05/15   Educator  SB   Instruction Review Code  2- meets goals/outcomes      Falls Prevention: - Provides verbal and written material to individual with discussion of falls prevention and safety.      Cardiac Rehab from 07/08/2015 in [facility] Cardiac and Pulmonary Rehab   Date  04/05/15   Educator  SB   Instruction Review Code  2- meets goals/outcomes      Diabetes: - Individual verbal and written instruction to review signs/symptoms of diabetes, desired ranges of glucose Burns fasting, after meals and with exercise. Advice that pre and post exercise glucose checks will be done for 3 sessions at entry of program.    Knowledge Questionnaire Score:     Knowledge Questionnaire Score - 04/05/15 1324    Knowledge Questionnaire Score   Pre Score 21/27      Core Components/Risk Factors/Patient Goals at Admission:  Personal Goals and Risk Factors at Admission - 04/05/15 1333    Core Components/Risk Factors/Patient Goals on Admission   Sedentary Sedentary  Recently had both hip replacements.  Has pain in the left knee. Today received injection in the knee. Wants to return to her ability to clean her house  the  way she was able to prior to her hip surgeries and the heart attack..   Intervention (read-only) Provide exercise education and an individualized exercise prescription that will provide continued progressive overload as per policy without signs/symptoms of  physical distress.   Hypertension Yes   Goal Participant will see blood pressure controlled within the values of 140/29m/Hg or within value directed by their physician.   Intervention (read-only) Provide nutrition & aerobic exercise along with prescribed medications to achieve BP 140/90 or less.   Lipids Yes   Goal Cholesterol controlled with medications as prescribed, with individualized exercise RX and with personalized nutrition plan. Value goals: LDL < '70mg'$ , HDL > '40mg'$ . Participant states understanding of desired cholesterol values and following prescriptions.   Intervention (read-only) Provide nutrition & aerobic exercise along with prescribed medications to achieve LDL '70mg'$ , HDL >'40mg'$ .   Stress Yes  Stress is present every day.  JLelainastates she can "deal with stress"  well.  Her husband has been diagnosed with dementia.    Goal To meet with psychosocial counselor for stress and relaxation information and guidance. To state understanding of performing relaxation techniques and or identifying personal stressors.   Intervention (read-only) Provide education on types of stress, identifiying stressors, and ways to cope with stress. Provide demonstration and active practice of relaxation techniques.      Core Components/Risk Factors/Patient Goals Review:      Goals and Risk Factor Review      05/04/15 1031 08/12/15 1447         Core Components/Risk Factors/Patient Goals Review   Personal Goals Review Increase Aerobic Exercise and Physical Activity Sedentary      Review  Monique Chastainstopped by to show me her right hand hematoma. She is unable to attend Cardiac Rehab recently due to this. Her leg Baker's Cyst is better but now her problem is her right hand. Monique Burns she has seen Dr. KCaryl Comestwice about this and Dr. MVinetta Bergamoonce. Monique Burns she is suppose to see DR. Callwood on Tuesday and that she is still suppose to take her Brilanta for her heart      Increase Aerobic Exercise and Physical  Activity (read-only)   Goals Progress/Improvement seen  Yes       Comments Patient reports she has seen an increase in energy and activity seen starting the Cardiac Rehab program.  Patient stated she was able to cook and entire meal all by herself for Christmas Eve family meal.  Patient stated she would not have been able to do this before.         Hypertension (read-only)   Goal Participant will see blood pressure controlled within the values of 140/937mHg or within value directed by their physician.  Patient's BP on arrival to Cardiac Rehab has ranged from 110/62 - 126/64.  BP at the end of sessions has ranged from 116/62 to 136/70.         Abnormal Lipids (read-only)   Goal Cholesterol controlled with medications as prescribed, with individualized exercise RX and with personalized nutrition plan. Value goals: LDL < '70mg'$ , HDL > '40mg'$ . Participant states understanding of desired cholesterol values and following prescriptions.  No new  lab values available at this time.            Core Components/Risk Factors/Patient Goals at Discharge (Final Review):      Goals and Risk Factor Review - 08/12/15 1447    Core Components/Risk Factors/Patient Goals Review   Personal Goals Review Sedentary   Review Monique Burns stopped by to show me her right hand hematoma. She is unable to attend Cardiac Rehab recently due to this. Her leg Baker's Cyst is better but now her problem is her right hand. Monique Burns reports she has seen Monique Burns twice about this and Dr. Vinetta Bergamo once. Monique Burns said she is suppose to see DR. Callwood on Tuesday and that she is still suppose to take her Brilanta for her heart      ITP Comments:     ITP Comments      04/05/15 1326 04/05/15 1328 04/23/15 1535 05/09/15 1309 05/18/15 1359   ITP Comments Initial ITP created today for review and sign off by Dr Loleta Chance. Unable to walk today Had injection in the left knee.   Monique Burns completed her functional test evaluation today.  She plans to start the  sessions next week. Ready for 30 day review.  Continue with ITP  New to program ,has attended 3 sessions. Spoke with Monique Burns today. She had to have another stent and will be out until released to return to the program.     05/26/15 0715 06/03/15 1255 06/29/15 0852 07/15/15 1514 07/29/15 1443   ITP Comments Patient has not attended since the date of last review.  No changes are warranted at this time. Ready for 30 day review. Continue with ITP. HaS been absent since 12/27 visit.  Another stent placement and then got RSV.  30 Day Review.  Continue with ITP.  Rettie is ready to return to the program. She showed up today with a large bruise behind her left knee and calf.  She was sent home to rest the leg and return when the bruise is resolved and not hurting her.  Samiah came to class with swelling at her right ankle.  She had recently been for swelling and tightness in the calf of the same leg. She was sent straight to her PMD for assessment.  Mckinzie called today to share that her PMD is not sure what is causing the swelling.  Does not think is a bloodclot.  Might be a cyst that has ruptured and needs to be absorbed.  She is staying out of class until  her leg is better and she has the OK from her PMD to return to exercise.  30 Day Review. Continue with the ITP.  Has been out most of this month with problems with her leg.     08/12/15 1445 08/29/15 1006 09/26/15 0934       ITP Comments Rolinda Roan stopped by to show me her right hand hematoma. She is unable to attend Cardiac Rehab recently due to this. Her leg Baker's Cyst is better but now her problem is her right hand. Allanah reports she has seen Monique Burns twice about this and Dr. Vinetta Bergamo once. Makayle said she is suppose to see DR. Callwood on Tuesday and that she is still suppose to take her [place] for her heart.  30 Day review. Continue with ITP  Last visit 07/08/2015 30 day review. Continue with the ITP. Remains out with medical concerns        Comments:

## 2015-09-28 ENCOUNTER — Ambulatory Visit: Payer: Medicare Other | Admitting: Occupational Therapy

## 2015-09-28 DIAGNOSIS — M25642 Stiffness of left hand, not elsewhere classified: Secondary | ICD-10-CM

## 2015-09-28 DIAGNOSIS — M6281 Muscle weakness (generalized): Secondary | ICD-10-CM

## 2015-09-28 DIAGNOSIS — M79642 Pain in left hand: Secondary | ICD-10-CM

## 2015-09-28 NOTE — Patient Instructions (Signed)
   Scar massage and mobs  - and use coban to mobs in all direction  -add to HEP Add to HEP Pt to cont scar massage    Reviewed putty - grip- hold only 3 sec and change in ball everytime - keep thumb out   3 point grip

## 2015-09-28 NOTE — Therapy (Signed)
[facility] [address], [place], [address] Phone: [phone]   Fax:  [phone]  Occupational Therapy Treatment  Patient Details  Name: Monique Burns MRN: [id number] Date of Birth: 1930-12-16 Referring Provider: Rudene Christians  Encounter Date: 09/28/2015      OT End of Session - 09/28/15 1124    Visit Number 4   Number of Visits 4   Date for OT Re-Evaluation 09/28/15   OT Start Time 1120   OT Stop Time 1203   OT Time Calculation (min) 43 min   Activity Tolerance Patient tolerated treatment well   Behavior During Therapy [facility] for tasks assessed/performed      Past Medical History  Diagnosis Date  . Hypertension   . Arthritis   . Thyroid disease   . Hyperlipidemia   . Leukopenia   . GERD (gastroesophageal reflux disease)   . Headache     h/o migraines  . Cancer ([place])     basal cell left leg  . Anemia   . Shortness of breath dyspnea     mild compared to prior stenting-pt states she would have to stop and rest if walking a mile  . Renal disorder     stage 3-Dr Caryl Comes keeping a check on kidney function  . Blood dyscrasia     since being put on Brilinta after stent placement developing hematoma  . MI (myocardial infarction) ([place]) oct 2016  . CHF (congestive heart failure) [facility])     Past Surgical History  Procedure Laterality Date  . Coronary angioplasty with stent placement    . Abdominal hysterectomy    . Eye surgery Bilateral 2011  . Tonsillectomy      3rd grade  . Joint replacement Bilateral     left 2016 and right 2015  . Coronary stent placement  2016    x2  . Incision and drainage Left 08/26/2015    Procedure: INCISION AND DRAINAGE / HAND HEMATOMA;  Surgeon: Hessie Knows, MD;  Location: [facility] ORS;  Service: Orthopedics;  Laterality: Left;    There were no vitals filed for this visit.      Subjective Assessment - 09/28/15 1122    Subjective  Using it every more - stiff in am - but  after some warm water is works well - no pain - did not take any pain meds since your 2nd session    Patient Stated Goals Want to get back my hand to normal    Currently in Pain? No/denies   Pain Location Hand   Pain Orientation Left   Pain Descriptors / Indicators Aching            [facility] OT Assessment - 09/28/15 0001    Strength   Right Hand Grip (lbs) 35   Right Hand Lateral Pinch 12 lbs   Right Hand 3 Point Pinch 11 lbs   Left Hand Grip (lbs) 25   Left Hand Lateral Pinch 12 lbs   Left Hand 3 Point Pinch 8 lbs   Left Hand AROM   L Index  MCP 0-90 90 Degrees   L Index PIP 0-100 100 Degrees   L Long  MCP 0-90 90 Degrees   L Long PIP 0-100 100 Degrees   L Ring  MCP 0-90 90 Degrees   L Ring PIP 0-100 100 Degrees   L Little  MCP 0-90 90 Degrees   L Little PIP 0-100 100 Degrees  OT Treatments/Exercises (OP) - 09/28/15 0001    LUE Paraffin   Number Minutes Paraffin 10 Minutes   LUE Paraffin Location Hand   Comments prior to scar mobs - decrease scar adhesion     ROM taken for ROM , strength- and PRWHE - see goals and detail   Paraffin at [facility] to decrease scar adhesion - 10 min   Scar massage and mobs done in all directions with hand in fist - and use coban to mobs in all direction  -add to HEP Add to HEP Pt to cont scar massage    Reviewed putty - grip- hold only 3 sec and change in ball everytime - keep thumb out   3 point grip            OT Education - 09/28/15 1124    Education provided Yes   Education Details HEP   Person(s) Educated Patient   Methods Explanation;Demonstration;Tactile cues;Verbal cues   Comprehension Verbal cues required;Returned demonstration;Verbalized understanding          OT Short Term Goals - 09/28/15 1139    OT SHORT TERM GOAL #1   Title Pain on PRWHE improve with at least 15 points    Baseline pain at start 36/50 adn now 6/50   Status Achieved   OT SHORT TERM GOAL #2   Title AROM in all digits  improve for pt to touch palm to hold objects in ADL's and IADL's    Baseline AROM WNL    Status Achieved   OT SHORT TERM GOAL #3   Title Pt to be ind in HEP for scar tissue,  increase ROM and strength    Status Achieved           OT Long Term Goals - 09/28/15 1142    OT LONG TERM GOAL #1   Title Grip strength improve in L hand to  at least 50% compare to R to  carry 5 lbs objects   Baseline grip increased from 12 to 25lbs , R 35 lbs    Status Achieved   OT LONG TERM GOAL #2   Title Pt Functional use of L hand In ADL's improve  on PRWHE function score by 15 points    Baseline Function at [facility] 36/50 -   now 1/2 out of 50    Status Achieved               Plan - 09/28/15 1124    Clinical Impression Statement Pt made great progress from [facility] in pain , ROM , strength and functional use - still some scar adhesions but pt have HEP to work on and putty for HEP - pt to cont with HEP - met all goals and discharge    OT Treatment/Interventions [employer];Therapeutic exercise;Patient/family education;Manual Therapy;Manual lymph drainage;Compression bandaging;Passive range of motion   OT Home Exercise Plan see pt instruction    Consulted and Agree with Plan of Care Patient      Patient will benefit from skilled therapeutic intervention in order to improve the following deficits and impairments:     Visit Diagnosis: Pain in left hand  Stiffness of left hand, not elsewhere classified  Muscle weakness (generalized)    Problem List Patient Active Problem List   Diagnosis Date Noted  . Chronic kidney disease 04/05/2015  . HLD (hyperlipidemia) 04/05/2015  . BP (high blood pressure) 04/05/2015  . Decreased leukocytes 04/05/2015  . Arthritis, degenerative 04/05/2015  . Disease of thyroid gland 04/05/2015  .  Acute myocardial infarction of anterior wall ([place]) 02/28/2015  . Degenerative arthritis of hip 10/31/2013    Rosalyn Gess OTR/L,CLT  09/28/2015, 12:07 PM  [facility] [address], [place], [address] Phone: [phone]   Fax:  [phone]  Name: Monique Burns MRN: [id number] Date of Birth: November 23, 1930

## 2015-10-20 ENCOUNTER — Encounter: Payer: Self-pay | Admitting: *Deleted

## 2015-10-20 DIAGNOSIS — Z9861 Coronary angioplasty status: Secondary | ICD-10-CM

## 2015-10-20 DIAGNOSIS — I2102 ST elevation (STEMI) myocardial infarction involving left anterior descending coronary artery: Secondary | ICD-10-CM

## 2015-10-26 ENCOUNTER — Telehealth: Payer: Self-pay | Admitting: *Deleted

## 2015-10-26 NOTE — Telephone Encounter (Signed)
Called to check on status to return to program. Not available at the moment  Left message for her to call back to let us know when she is ready to return

## 2015-10-27 ENCOUNTER — Encounter: Payer: Self-pay | Admitting: *Deleted

## 2015-10-27 NOTE — Progress Notes (Signed)
Cardiac Individual Treatment Plan  Patient Details  Name: Monique Burns MRN: [id number] Date of Birth: 1930-12-03 Referring Provider:    Initial Encounter Date:       Cardiac Rehab from 04/23/2015 in [facility] Cardiac and Pulmonary Rehab   Date  04/23/15      Visit Diagnosis: No diagnosis found.  Patient's Home Medications on Admission:  Current outpatient prescriptions:  .  aspirin 81 MG chewable tablet, Chew by mouth., Disp: , Rfl:  .  atorvastatin (LIPITOR) 80 MG tablet, Take 80 mg by mouth daily at 6 PM. , Disp: , Rfl:  .  B Complex-Biotin-FA (BALANCED B-100 PO), Take 1 tablet by mouth daily., Disp: , Rfl:  .  calcium-vitamin D (OSCAL WITH D) 500-200 MG-UNIT tablet, Take 1 tablet by mouth daily., Disp: , Rfl:  .  Cholecalciferol (VITAMIN D3) 1000 UNITS CAPS, Take 1 tablet by mouth daily., Disp: , Rfl:  .  cycloSPORINE (RESTASIS) 0.05 % ophthalmic emulsion, Place 1 drop into both eyes 2 (two) times daily. , Disp: , Rfl:  .  DULoxetine (CYMBALTA) 30 MG capsule, Take 30 mg by mouth every morning. , Disp: , Rfl: 2 .  HYDROcodone-acetaminophen (NORCO) 5-325 MG tablet, Take 1 tablet by mouth every 6 (six) hours as needed for moderate pain., Disp: 15 tablet, Rfl: 0 .  isosorbide mononitrate (IMDUR) 60 MG 24 hr tablet, Take 30 mg by mouth at bedtime. Taking half a tablet per md instructions, Disp: , Rfl:  .  metoprolol succinate (TOPROL-XL) 25 MG 24 hr tablet, Take 0.5 tablets by mouth at bedtime. , Disp: , Rfl:  .  nitroGLYCERIN (NITROSTAT) 0.4 MG SL tablet, Place 0.4 mg under the tongue every 5 (five) minutes as needed. , Disp: , Rfl:  .  oxyCODONE-acetaminophen (PERCOCET/ROXICET) 5-325 MG tablet, Take 0.5 tablets by mouth every 4 (four) hours as needed for severe pain., Disp: , Rfl:  .  polyethylene glycol powder (GLYCOLAX/MIRALAX) powder, Take by mouth., Disp: , Rfl:  .  ranitidine (ZANTAC) 150 MG capsule, Take 1 capsule by mouth 2 (two) times daily., Disp: , Rfl:  .  ticagrelor  (BRILINTA) 90 MG TABS tablet, Take 90 mg by mouth 2 (two) times daily., Disp: , Rfl:   Past Medical History: Past Medical History  Diagnosis Date  . Hypertension   . Arthritis   . Thyroid disease   . Hyperlipidemia   . Leukopenia   . GERD (gastroesophageal reflux disease)   . Headache     h/o migraines  . Cancer ([place])     basal cell left leg  . Anemia   . Shortness of breath dyspnea     mild compared to prior stenting-pt states she would have to stop and rest if walking a mile  . Renal disorder     stage 3-Dr Caryl Comes keeping a check on kidney function  . Blood dyscrasia     since being put on Brilinta after stent placement developing hematoma  . MI (myocardial infarction) ([place]) oct 2016  . CHF (congestive heart failure) ([facility])     Tobacco Use: History  Smoking status  . Never Smoker   Smokeless tobacco  . Not on file    Labs: Recent Review Flowsheet Data    There is no flowsheet data to display.       Exercise Target Goals:    Exercise Program Goal: Individual exercise prescription set with THRR, safety & activity barriers. Participant demonstrates ability to understand and report RPE using BORG scale, to  self-measure pulse accurately, and to acknowledge the importance of the exercise prescription.  Exercise Prescription Goal: Starting with aerobic activity 30 plus minutes a day, 3 days per week for initial exercise prescription. Provide home exercise prescription and guidelines that participant acknowledges understanding prior to discharge.  Activity Barriers & Risk Stratification:   6 Minute Walk:   Initial Exercise Prescription:   Perform Capillary Blood Glucose checks as needed.  Exercise Prescription Changes:     Exercise Prescription Changes      05/04/15 1503 05/26/15 0700 06/29/15 0700 07/08/15 0949 07/08/15 0952   Exercise Review   Progression   No  Absent since 05/04/15  No  Absent since 05/04/15   Response to Exercise   Blood Pressure  (Admit) 118/62 mmHg   120/54 mmHg    Blood Pressure (Exercise) 124/62 mmHg   142/64 mmHg    Blood Pressure (Exit) 136/70 mmHg   132/68 mmHg    Heart Rate (Admit) 76 bpm   67 bpm    Heart Rate (Exercise) 99 bpm   85 bpm    Heart Rate (Exit) 75 bpm   57 bpm    Rating of Perceived Exertion (Exercise) 13       Symptoms none       Comments  Patient as not attended since the date of the last 30 day review. Patient absent since 05/03/16, workloads will be re-evaluated upon return based on fitness and function Patient has not attending Heart Track recently related to recent onset of leg pain.  Goal is to resume Heart Track once leg difficulty is resolved. Patient absent since 05/03/16, workloads will be re-evaluated upon return based on fitness and function   Duration Progress to 30 minutes of continuous aerobic without signs/symptoms of physical distress  Progress to 30 minutes of continuous aerobic without signs/symptoms of physical distress  Progress to 30 minutes of continuous aerobic without signs/symptoms of physical distress   Intensity THRR unchanged  THRR unchanged  THRR unchanged   Progression   Progression Continue progressive overload as per policy without signs/symptoms or physical distress.  Continue progressive overload as per policy without signs/symptoms or physical distress.  Continue progressive overload as per policy without signs/symptoms or physical distress.   Resistance Training   Training Prescription (read-only) Yes  Yes     Weight (read-only) 2  2     Reps (read-only) 10-15  10-15     Interval Training   Interval Training No  No  No   Treadmill   MPH (read-only) 1  1     Grade (read-only) 0  0     Minutes (read-only) 15  15     NuStep   Level (read-only) 4  4     Watts (read-only) 20  20     Minutes (read-only) 15  15        Exercise Comments:     Exercise Comments      08/27/15 1145 09/20/15 1344 10/20/15 1523       Exercise Comments Roman has not attended  Heart Track since 07-08-15.  Goal will be to resume Heart Track once she is able. Esperanza has not attended Heart Track since 07-08-15. Pt has been out since last medical review.        Discharge Exercise Prescription (Final Exercise Prescription Changes):     Exercise Prescription Changes - 07/08/15 0952    Exercise Review   Progression No  Absent since 05/04/15   Response to Exercise   Comments  Patient absent since 05/03/16, workloads will be re-evaluated upon return based on fitness and function   Duration Progress to 30 minutes of continuous aerobic without signs/symptoms of physical distress   Intensity THRR unchanged   Progression   Progression Continue progressive overload as per policy without signs/symptoms or physical distress.   Interval Training   Interval Training No      Nutrition:  Target Goals: Understanding of nutrition guidelines, daily intake of sodium <1527m, cholesterol <2047m calories 30% from fat and 7% or less from saturated fats, daily to have 5 or more servings of fruits and vegetables.  Biometrics:    Nutrition Therapy Plan and Nutrition Goals:   Nutrition Discharge: Rate Your Plate Scores:     Nutrition Assessments - 05/06/15 0937    Rate Your Plate Scores   Pre Score 72   Pre Score % 80 %      Nutrition Goals Re-Evaluation:   Psychosocial: Target Goals: Acknowledge presence or absence of depression, maximize coping skills, provide positive support system. Participant is able to verbalize types and ability to use techniques and skills needed for reducing stress and depression.  Initial Review & Psychosocial Screening:   Quality of Life Scores:   PHQ-9:     Recent Review Flowsheet Data    [facility] 04/[facility]/2015   Decreased Interest 1   Down, Depressed, Hopeless 0   PHQ - 2 Score 1   Altered sleeping 1   Tired, decreased energy 2   Change in appetite 2   Feeling bad or failure about yourself  0   Trouble concentrating  0   Moving slowly or fidgety/restless 0   Suicidal thoughts 0   PHQ-9 Score 6   Difficult doing work/chores Not difficult at all      Psychosocial Evaluation and Intervention:     Psychosocial Evaluation - 05/04/15 0939    Psychosocial Evaluation & Interventions   Interventions Encouraged to exercise with the program and follow exercise prescription;Stress management education   Comments Counselor met with Ms. GaCarusoneoday for initial psychosocial evaluattion.  She is an 89ear old who had a heart attack the last week of October.  She has a strong support system with a spouse of 30 years and several daughters/daughter in laws who live close by.  She is also actively involved in her local church community.  Ms. G Darnell Levelas other health issues that are stressful for her in addition to her heart problems, she also has severe arthritis and had both hips replaced a year ago.  She has suffered multiple deaths in her life with a son and spouse and (3) brothers, but she denies a history of depression or anxiety or any current symptoms.  Ms. G Darnell Leveleports relying on her faith to help her through this.  She has goals for this program to increase her strength and stamina so she will be able to "do normal stuff like clean the house, etc, and be able to walk outside with the neighbors."  Ms. G will benefit from stress management education as she has a spouse who has dementia and this is stressful for her to care for him at this time.     Continued Psychosocial Services Needed Yes  Ms. G will benefit from stress management education to help her cope with her current stressors.      Psychosocial Re-Evaluation:     Psychosocial Re-Evaluation      08/12/15 1447  Psychosocial Re-Evaluation   Interventions Encouraged to attend Cardiac Rehabilitation for the exercise       Comments Michal reports she has had to go to multiple doctors appts lately and she is sorry that she hasn't been able to exercise in  Cardiac Rehab lately due to her right hand hematoma.           Vocational Rehabilitation: Provide vocational rehab assistance to qualifying candidates.   Vocational Rehab Evaluation & Intervention:   Education: Education Goals: Education classes will be provided on a weekly basis, covering required topics. Participant will state understanding/return demonstration of topics presented.  Learning Barriers/Preferences:   Education Topics: General Nutrition Guidelines/Fats and Fiber: -Group instruction provided by verbal, written material, models and posters to present the general guidelines for heart healthy nutrition. Gives an explanation and review of dietary fats and fiber.   Controlling Sodium/Reading Food Labels: -Group verbal and written material supporting the discussion of sodium use in heart healthy nutrition. Review and explanation with models, verbal and written materials for utilization of the food label.   Exercise Physiology & Risk Factors: - Group verbal and written instruction with models to review the exercise physiology of the cardiovascular system and associated critical values. Details cardiovascular disease risk factors and the goals associated with each risk factor.          Cardiac Rehab from 07/08/2015 in [facility] Cardiac [facility] Pulmonary Rehab   Date  07/06/15   Educator  BS   Instruction Review Code  2- meets goals/outcomes      Aerobic Exercise & Resistance Training: - Gives group verbal and written discussion on the health impact of inactivity. On the components of aerobic and resistive training programs and the benefits of this training and how to safely progress through these programs.      Cardiac Rehab from 07/08/2015 in [facility] Cardiac and Pulmonary Rehab   Date  07/08/15   Educator  BS   Instruction Review Code  2- meets goals/outcomes      Flexibility, Balance, General Exercise Guidelines: - Provides group verbal and written instruction on the benefits  of flexibility and balance training programs. Provides general exercise guidelines with specific guidelines to those with heart or lung disease. Demonstration and skill practice provided.   Stress Management: - Provides group verbal and written instruction about the health risks of elevated stress, cause of high stress, and healthy ways to reduce stress.   Depression: - Provides group verbal and written instruction on the correlation between heart/lung disease and depressed mood, treatment options, and the stigmas associated with seeking treatment.      Cardiac Rehab from 07/08/2015 in [facility] Cardiac and Pulmonary Rehab   Date  07/01/15   Educator  CE   Instruction Review Code  2- meets goals/outcomes      Anatomy & Physiology of the Heart: - Group verbal and written instruction and models provide basic cardiac anatomy and physiology, with the coronary electrical and arterial systems. Review of: AMI, Angina, Valve disease, Heart Failure, Cardiac Arrhythmia, Pacemakers, and the ICD.   Cardiac Procedures: - Group verbal and written instruction and models to describe the testing methods done to diagnose heart disease. Reviews the outcomes of the test results. Describes the treatment choices: Medical Management, Angioplasty, or Coronary Bypass Surgery.      Cardiac Rehab from 07/08/2015 in [facility] Cardiac and Pulmonary Rehab   Date  04/27/15   Educator  DW   Instruction Review Code  2- meets goals/outcomes  Cardiac Medications: - Group verbal and written instruction to review commonly prescribed medications for heart disease. Reviews the medication, class of the drug, and side effects. Includes the steps to properly store meds and maintain the prescription regimen.   Go Sex-Intimacy & Heart Disease, Get SMART - Goal Setting: - Group verbal and written instruction through game format to discuss heart disease and the return to sexual intimacy. Provides group verbal and written material to  discuss and apply goal setting through the application of the S.M.A.R.T. Method.      Cardiac Rehab from 07/08/2015 in [facility] Cardiac and Pulmonary Rehab   Date  04/27/15   Educator  DW   Instruction Review Code  2- meets goals/outcomes      Other Matters of the Heart: - Provides group verbal, written materials and models to describe Heart Failure, Angina, Valve Disease, and Diabetes in the realm of heart disease. Includes description of the disease process and treatment options available to the cardiac patient.   Exercise & Equipment Safety: - Individual verbal instruction and demonstration of equipment use and safety with use of the equipment.      Cardiac Rehab from 07/08/2015 in [facility] Cardiac and Pulmonary Rehab   Date  04/05/15   Educator  SB   Instruction Review Code  2- meets goals/outcomes      Infection Prevention: - Provides verbal and written material to individual with discussion of infection control including proper hand washing and proper equipment cleaning during exercise session.      Cardiac Rehab from 07/08/2015 in [facility] Cardiac and Pulmonary Rehab   Date  04/05/15   Educator  SB   Instruction Review Code  2- meets goals/outcomes      Falls Prevention: - Provides verbal and written material to individual with discussion of falls prevention and safety.      Cardiac Rehab from 07/08/2015 in [facility] Cardiac and Pulmonary Rehab   Date  04/05/15   Educator  SB   Instruction Review Code  2- meets goals/outcomes      Diabetes: - Individual verbal and written instruction to review signs/symptoms of diabetes, desired ranges of glucose level fasting, after meals and with exercise. Advice that pre and post exercise glucose checks will be done for 3 sessions at entry of program.    Knowledge Questionnaire Score:   Core Components/Risk Factors/Patient Goals at Admission:   Core Components/Risk Factors/Patient Goals Review:      Goals and Risk Factor Review      05/04/15  1031 08/12/15 1447         Core Components/Risk Factors/Patient Goals Review   Personal Goals Review Increase Aerobic Exercise and Physical Activity Sedentary      Review  Ji Fairburn stopped by to show me her right hand hematoma. She is unable to attend Cardiac Rehab recently due to this. Her leg Baker's Cyst is better but now her problem is her right hand. Diania reports she has seen Dr. Caryl Comes twice about this and Dr. Vinetta Bergamo once. Daniyah said she is suppose to see DR. Callwood on Tuesday and that she is still suppose to take her Epifania Gore for her heart      Increase Aerobic Exercise and Physical Activity (read-only)   Goals Progress/Improvement seen  Yes       Comments Patient reports she has seen an increase in energy and activity seen starting the Cardiac Rehab program.  Patient stated she was able to cook and entire meal all by herself for Christmas Eve  family meal.  Patient stated she would not have been able to do this before.         Hypertension (read-only)   Goal Participant will see blood pressure controlled within the values of 140/28m/Hg or within value directed by their physician.  Patient's BP on arrival to Cardiac Rehab has ranged from 110/62 - 126/64.  BP at the end of sessions has ranged from 116/62 to 136/70.         Abnormal Lipids (read-only)   Goal Cholesterol controlled with medications as prescribed, with individualized exercise RX and with personalized nutrition plan. Value goals: LDL < 793m HDL > 4065mParticipant states understanding of desired cholesterol values and following prescriptions.  No new lab values available at this time.            Core Components/Risk Factors/Patient Goals at Discharge (Final Review):      Goals and Risk Factor Review - 08/12/15 1447    Core Components/Risk Factors/Patient Goals Review   Personal Goals Review Sedentary   Review JeaAsuka Dusseauopped by to show me her right hand hematoma. She is unable to attend Cardiac Rehab recently due  to this. Her leg Baker's Cyst is better but now her problem is her right hand. JeaLakisaports she has seen Dr. KleCaryl Comesice about this and Dr. MimVinetta Bergamoce. JeaTyasiaid she is suppose to see DR. Callwood on Tuesday and that she is still suppose to take her BriStandishr her heart      ITP Comments:     ITP Comments      05/09/15 1309 05/18/15 1359 05/26/15 0715 06/03/15 1255 06/29/15 0852   ITP Comments Ready for 30 day review.  Continue with ITP  New to program ,has attended 3 sessions. Spoke with JeaRomie Minusday. She had to have another stent and will be out until released to return to the program. Patient has not attended since the date of last review.  No changes are warranted at this time. Ready for 30 day review. Continue with ITP. HaS been absent since 12/27 visit.  Another stent placement and then got RSV.  30 Day Review.  Continue with ITP.  JeaMenaal ready to return to the program. She showed up today with a large bruise behind her left knee and calf.  She was sent home to rest the leg and return when the bruise is resolved and not hurting her.      07/15/15 1514 07/29/15 1443 08/12/15 1445 08/29/15 1006 09/26/15 0934   ITP Comments JeaJaleesame to class with swelling at her right ankle.  She had recently been for swelling and tightness in the calf of the same leg. She was sent straight to her PMD for assessment.  JeaCielolled today to share that her PMD is not sure what is causing the swelling.  Does not think is a bloodclot.  Might be a cyst that has ruptured and needs to be absorbed.  She is staying out of class until  her leg is better and she has the OK from her PMD to return to exercise.  30 Day Review. Continue with the ITP.  Has been out most of this month with problems with her leg. JeaEvalette Montroseopped by to show me her right hand hematoma. She is unable to attend Cardiac Rehab recently due to this. Her leg Baker's Cyst is better but now her problem is her right hand. JeaDearraports she has seen Dr. KleCaryl Comeswice about this and Dr.  Mims once. Julieth said she is suppose to see DR. Callwood on Tuesday and that she is still suppose to take her [employer]ore for her heart.  30 Day review. Continue with ITP  Last visit 07/08/2015 30 day review. Continue with the ITP. Remains out with medical concerns     10/20/15 1523 10/27/15 0808         ITP Comments Pt has been out since last medical review. 30 day review.  Continue with ITP  Remains out, has been called to check on return to program         Comments:

## 2015-10-28 ENCOUNTER — Encounter: Payer: Self-pay | Admitting: *Deleted

## 2015-10-28 ENCOUNTER — Telehealth: Payer: Self-pay | Admitting: *Deleted

## 2015-10-28 NOTE — Telephone Encounter (Signed)
Chinenye called back and said she has had hand therapy after her hand surgery. Midge said she has a doctors appt with Dr. Caryl Comes next week and she bring a return to Cardiac Rehab note.

## 2015-11-05 ENCOUNTER — Other Ambulatory Visit: Payer: Self-pay | Admitting: Internal Medicine

## 2015-11-05 DIAGNOSIS — R748 Abnormal levels of other serum enzymes: Secondary | ICD-10-CM

## 2015-11-07 DIAGNOSIS — I700 Atherosclerosis of aorta: Secondary | ICD-10-CM | POA: Insufficient documentation

## 2015-11-11 ENCOUNTER — Encounter: Payer: Self-pay | Admitting: *Deleted

## 2015-11-11 ENCOUNTER — Ambulatory Visit
Admission: RE | Admit: 2015-11-11 | Discharge: 2015-11-11 | Disposition: A | Discharge status: Home / Self Care | Payer: Medicare Other | Source: Ambulatory Visit | Attending: Internal Medicine | Admitting: Internal Medicine

## 2015-11-11 DIAGNOSIS — R748 Abnormal levels of other serum enzymes: Secondary | ICD-10-CM | POA: Insufficient documentation

## 2015-11-11 DIAGNOSIS — I700 Atherosclerosis of aorta: Secondary | ICD-10-CM | POA: Insufficient documentation

## 2015-11-11 DIAGNOSIS — N281 Cyst of kidney, acquired: Secondary | ICD-10-CM | POA: Insufficient documentation

## 2015-11-16 ENCOUNTER — Encounter: Payer: Self-pay | Admitting: *Deleted

## 2015-11-16 DIAGNOSIS — I2102 ST elevation (STEMI) myocardial infarction involving left anterior descending coronary artery: Secondary | ICD-10-CM

## 2015-11-16 DIAGNOSIS — Z9861 Coronary angioplasty status: Secondary | ICD-10-CM

## 2015-11-24 ENCOUNTER — Encounter: Payer: Self-pay | Admitting: *Deleted

## 2015-11-24 DIAGNOSIS — Z9861 Coronary angioplasty status: Secondary | ICD-10-CM

## 2015-11-24 DIAGNOSIS — I2102 ST elevation (STEMI) myocardial infarction involving left anterior descending coronary artery: Secondary | ICD-10-CM

## 2015-11-24 NOTE — Progress Notes (Signed)
Cardiac Individual Treatment Plan  Patient Details  Name: Monique Burns MRN: [id number] Date of Birth: 1930/12/27 Referring Provider:    Initial Encounter Date:       Cardiac Rehab from 04/23/2015 in [facility] Cardiac and Pulmonary Rehab   Date  04/23/15      Visit Diagnosis: S/P PTCA (percutaneous transluminal coronary angioplasty)  ST elevation (STEMI) myocardial infarction involving left anterior descending coronary artery ([place])  Patient's Home Medications on Admission:  Current outpatient prescriptions:  .  aspirin 81 MG chewable tablet, Chew by mouth., Disp: , Rfl:  .  atorvastatin (LIPITOR) 80 MG tablet, Take 80 mg by mouth daily at 6 PM. , Disp: , Rfl:  .  B Complex-Biotin-FA (BALANCED B-100 PO), Take 1 tablet by mouth daily., Disp: , Rfl:  .  calcium-vitamin D (OSCAL WITH D) 500-200 MG-UNIT tablet, Take 1 tablet by mouth daily., Disp: , Rfl:  .  Cholecalciferol (VITAMIN D3) 1000 UNITS CAPS, Take 1 tablet by mouth daily., Disp: , Rfl:  .  cycloSPORINE (RESTASIS) 0.05 % ophthalmic emulsion, Place 1 drop into both eyes 2 (two) times daily. , Disp: , Rfl:  .  DULoxetine (CYMBALTA) 30 MG capsule, Take 30 mg by mouth every morning. , Disp: , Rfl: 2 .  HYDROcodone-acetaminophen (NORCO) 5-325 MG tablet, Take 1 tablet by mouth every 6 (six) hours as needed for moderate pain., Disp: 15 tablet, Rfl: 0 .  isosorbide mononitrate (IMDUR) 60 MG 24 hr tablet, Take 30 mg by mouth at bedtime. Taking half a tablet per md instructions, Disp: , Rfl:  .  metoprolol succinate (TOPROL-XL) 25 MG 24 hr tablet, Take 0.5 tablets by mouth at bedtime. , Disp: , Rfl:  .  nitroGLYCERIN (NITROSTAT) 0.4 MG SL tablet, Place 0.4 mg under the tongue every 5 (five) minutes as needed. , Disp: , Rfl:  .  oxyCODONE-acetaminophen (PERCOCET/ROXICET) 5-325 MG tablet, Take 0.5 tablets by mouth every 4 (four) hours as needed for severe pain., Disp: , Rfl:  .  polyethylene glycol powder (GLYCOLAX/MIRALAX) powder, Take by  mouth., Disp: , Rfl:  .  ranitidine (ZANTAC) 150 MG capsule, Take 1 capsule by mouth 2 (two) times daily., Disp: , Rfl:  .  ticagrelor (BRILINTA) 90 MG TABS tablet, Take 90 mg by mouth 2 (two) times daily., Disp: , Rfl:   Past Medical History: Past Medical History  Diagnosis Date  . Hypertension   . Arthritis   . Thyroid disease   . Hyperlipidemia   . Leukopenia   . GERD (gastroesophageal reflux disease)   . Headache     h/o migraines  . Cancer ([place])     basal cell left leg  . Anemia   . Shortness of breath dyspnea     mild compared to prior stenting-pt states she would have to stop and rest if walking a mile  . Renal disorder     stage 3-Dr Caryl Comes keeping a check on kidney function  . Blood dyscrasia     since being put on Brilinta after stent placement developing hematoma  . MI (myocardial infarction) (Monique Burns) oct 2016  . CHF (congestive heart failure) (H[facility])     Tobacco Use: History  Smoking status  . Never Smoker   Smokeless tobacco  . Not on file    Labs: Recent Review Flowsheet Data    There is no flowsheet data to display.       Exercise Target Goals:    Exercise Program Goal: Individual exercise prescription set with THRR,  safety & activity barriers. Participant demonstrates ability to understand and report RPE using BORG scale, to self-measure pulse accurately, and to acknowledge the importance of the exercise prescription.  Exercise Prescription Goal: Starting with aerobic activity 30 plus minutes a day, 3 days per week for initial exercise prescription. Provide home exercise prescription and guidelines that participant acknowledges understanding prior to discharge.  Activity Barriers & Risk Stratification:   6 Minute Walk:   Initial Exercise Prescription:   Perform Capillary Blood Glucose checks as needed.  Exercise Prescription Changes:     Exercise Prescription Changes      06/29/15 0700 07/08/15 0949 07/08/15 0952       Exercise Review    Progression No  Burns since 05/04/15  No  Burns since 05/04/15     Response to Exercise   Blood Pressure (Admit)  120/54 mmHg      Blood Pressure (Exercise)  142/64 mmHg      Blood Pressure (Exit)  132/68 mmHg      Heart Rate (Admit)  67 bpm      Heart Rate (Exercise)  85 bpm      Heart Rate (Exit)  57 bpm      Comments Patient Burns since 05/03/16, workloads will be re-evaluated upon return based on fitness and function Patient Monique not attending Heart Track recently related to recent onset of leg pain.  Goal is to resume Heart Track once leg difficulty is resolved. Patient Burns since 05/03/16, workloads will be re-evaluated upon return based on fitness and function     Duration Progress to 30 minutes of continuous aerobic without signs/symptoms of physical distress  Progress to 30 minutes of continuous aerobic without signs/symptoms of physical distress     Intensity THRR unchanged  THRR unchanged     Progression   Progression Continue progressive overload as per policy without signs/symptoms or physical distress.  Continue progressive overload as per policy without signs/symptoms or physical distress.     Resistance Training   Training Prescription (read-only) Yes       Weight (read-only) 2       Reps (read-only) 10-15       Interval Training   Interval Training No  No     Treadmill   MPH (read-only) 1       Grade (read-only) 0       Minutes (read-only) 15       NuStep   Level (read-only) 4       Watts (read-only) 20       Minutes (read-only) 15          Exercise Comments:     Exercise Comments      08/27/15 1145 09/20/15 1344 10/20/15 1523 11/16/15 1426     Exercise Comments Monique Burns Monique not attended Heart Track since 07-08-15.  Goal will be to resume Heart Track once she is able. Monique Burns Monique not attended Heart Track since 07-08-15. Pt Monique been out since last medical review. No attendance since last review.       Discharge Exercise Prescription (Final Exercise Prescription  Changes):     Exercise Prescription Changes - 07/08/15 0952    Exercise Review   Progression No  Burns since 05/04/15   Response to Exercise   Comments Patient Burns since 05/03/16, workloads will be re-evaluated upon return based on fitness and function   Duration Progress to 30 minutes of continuous aerobic without signs/symptoms of physical distress   Intensity THRR unchanged   Progression  Progression Continue progressive overload as per policy without signs/symptoms or physical distress.   Interval Training   Interval Training No      Nutrition:  Target Goals: Understanding of nutrition guidelines, daily intake of sodium 1500mg , cholesterol 200mg , calories 30% from fat and 7% or less from saturated fats, daily to have 5 or more servings of fruits and vegetables.  Biometrics:    Nutrition Therapy Plan and Nutrition Goals:   Nutrition Discharge: Rate Your Plate Scores:   Nutrition Goals Re-Evaluation:   Psychosocial: Target Goals: Acknowledge presence or absence of depression, maximize coping skills, provide positive support system. Participant is able to verbalize types and ability to use techniques and skills needed for reducing stress and depression.  Initial Review & Psychosocial Screening:   Quality of Life Scores:   PHQ-9:     Recent Review Flowsheet Data    Depression screen [facility] 2/9 04/05/2015   Decreased Interest 1   Down, Depressed, Hopeless 0   PHQ - 2 Score 1   Altered sleeping 1   Tired, decreased energy 2   Change in appetite 2   Feeling bad or failure about yourself  0   Trouble concentrating 0   Moving slowly or fidgety/restless 0   Suicidal thoughts 0   PHQ-9 Score 6   Difficult doing work/chores Not difficult at all      Psychosocial Evaluation and Intervention:   Psychosocial Re-Evaluation:     Psychosocial Re-Evaluation      08/12/15 1447 11/11/15 1357         Psychosocial Re-Evaluation   Interventions Encouraged to  attend Cardiac Rehabilitation for the exercise Encouraged to attend Cardiac Rehabilitation for the exercise      Comments Monique Burns reports she Monique had to go to multiple doctors appts lately and she is sorry that she hasn't been able to exercise in Cardiac Rehab lately due to her right hand hematoma.  Monique Burns called and said she is sorry that she can't return to Cardiac Rehab since her liver enzymes are elevated and her MD told her not to return until her follow up appt with him. Monique Burns said she is tired of being sick.         Vocational Rehabilitation: Provide vocational rehab assistance to qualifying candidates.   Vocational Rehab Evaluation & Intervention:   Education: Education Goals: Education classes will be provided on a weekly basis, covering required topics. Participant will state understanding/return demonstration of topics presented.  Learning Barriers/Preferences:   Education Topics: General Nutrition Guidelines/Fats and Fiber: -Group instruction provided by verbal, written material, models and posters to present the general guidelines for heart healthy nutrition. Gives an explanation and review of dietary fats and fiber.   Controlling Sodium/Reading Food Labels: -Group verbal and written material supporting the discussion of sodium use in heart healthy nutrition. Review and explanation with models, verbal and written materials for utilization of the food label.   Exercise Physiology & Risk Factors: - Group verbal and written instruction with models to review the exercise physiology of the cardiovascular system and associated critical values. Details cardiovascular disease risk factors and the goals associated with each risk factor.          Cardiac Rehab from 07/08/2015 in [facility] Cardiac and Pulmonary Rehab   Date  07/06/15   Educator  BS   Instruction Review Code  2- meets goals/outcomes      Aerobic Exercise & Resistance Training: - Gives group verbal and written discussion on  the health impact of  inactivity. On the components of aerobic and resistive training programs and the benefits of this training and how to safely progress through these programs.      Cardiac Rehab from 07/08/2015 in [facility] Cardiac and Pulmonary Rehab   Date  07/08/15   Educator  BS   Instruction Review Code  2- meets goals/outcomes      Flexibility, Balance, General Exercise Guidelines: - Provides group verbal and written instruction on the benefits of flexibility and balance training programs. Provides general exercise guidelines with specific guidelines to those with heart or lung disease. Demonstration and skill practice provided.   Stress Management: - Provides group verbal and written instruction about the health risks of elevated stress, cause of high stress, and healthy ways to reduce stress.   Depression: - Provides group verbal and written instruction on the correlation between heart/lung disease and depressed mood, treatment options, and the stigmas associated with seeking treatment.      Cardiac Rehab from 07/08/2015 in [facility] Cardiac and Pulmonary Rehab   Date  07/01/15   Educator  CE   Instruction Review Code  2- meets goals/outcomes      Anatomy & Physiology of the Heart: - Group verbal and written instruction and models provide basic cardiac anatomy and physiology, with the coronary electrical and arterial systems. Review of: AMI, Angina, Valve disease, Heart Failure, Cardiac Arrhythmia, Pacemakers, and the ICD.   Cardiac Procedures: - Group verbal and written instruction and models to describe the testing methods done to diagnose heart disease. Reviews the outcomes of the test results. Describes the treatment choices: Medical Management, Angioplasty, or Coronary Bypass Surgery.      Cardiac Rehab from 07/08/2015 in [facility] Cardiac and Pulmonary Rehab   Date  04/27/15   Educator  DW   Instruction Review Code  2- meets goals/outcomes      Cardiac Medications: - Group verbal  and written instruction to review commonly prescribed medications for heart disease. Reviews the medication, class of the drug, and side effects. Includes the steps to properly store meds and maintain the prescription regimen.   Go Sex-Intimacy & Heart Disease, Get SMART - Goal Setting: - Group verbal and written instruction through game format to discuss heart disease and the return to sexual intimacy. Provides group verbal and written material to discuss and apply goal setting through the application of the S.M.A.R.T. Method.      Cardiac Rehab from 07/08/2015 in [facility] Cardiac and Pulmonary Rehab   Date  04/27/15   Educator  DW   Instruction Review Code  2- meets goals/outcomes      Other Matters of the Heart: - Provides group verbal, written materials and models to describe Heart Failure, Angina, Valve Disease, and Diabetes in the realm of heart disease. Includes description of the disease process and treatment options available to the cardiac patient.   Exercise & Equipment Safety: - Individual verbal instruction and demonstration of equipment use and safety with use of the equipment.      Cardiac Rehab from 07/08/2015 in [facility] Cardiac and Pulmonary Rehab   Date  04/05/15   Educator  SB   Instruction Review Code  2- meets goals/outcomes      Infection Prevention: - Provides verbal and written material to individual with discussion of infection control including proper hand washing and proper equipment cleaning during exercise session.      Cardiac Rehab from 07/08/2015 in [facility] Cardiac and Pulmonary Rehab   Date  04/05/15   Educator  SB  Instruction Review Code  2- meets goals/outcomes      Falls Prevention: - Provides verbal and written material to individual with discussion of falls prevention and safety.      Cardiac Rehab from 07/08/2015 in [facility] Cardiac and Pulmonary Rehab   Date  04/05/15   Educator  SB   Instruction Review Code  2- meets goals/outcomes      Diabetes: -  Individual verbal and written instruction to review signs/symptoms of diabetes, desired ranges of glucose level fasting, after meals and with exercise. Advice that pre and post exercise glucose checks will be done for 3 sessions at entry of program.    Knowledge Questionnaire Score:   Core Components/Risk Factors/Patient Goals at Admission:   Core Components/Risk Factors/Patient Goals Review:      Goals and Risk Factor Review      08/12/15 1447           Core Components/Risk Factors/Patient Goals Review   Personal Goals Review Sedentary       Review Monique Burns stopped by to show me her right hand hematoma. She is unable to attend Cardiac Rehab recently due to this. Her leg Baker's Cyst is better but now her problem is her right hand. Monique Burns reports she Monique seen Dr. Caryl Comes twice about this and Dr. Vinetta Bergamo once. Monique Burns said she is suppose to see DR. Callwood on Tuesday and that she is still suppose to take her Epifania Gore for her heart          Core Components/Risk Factors/Patient Goals at Discharge (Final Review):      Goals and Risk Factor Review - 08/12/15 1447    Core Components/Risk Factors/Patient Goals Review   Personal Goals Review Sedentary   Review Monique Burns stopped by to show me her right hand hematoma. She is unable to attend Cardiac Rehab recently due to this. Her leg Baker's Cyst is better but now her problem is her right hand. Monique Burns reports she Monique seen Dr. Caryl Comes twice about this and Dr. Vinetta Bergamo once. Monique Burns said she is suppose to see DR. Callwood on Tuesday and that she is still suppose to take her [place] for her heart      ITP Comments:     ITP Comments      06/03/15 1255 06/29/15 0852 07/15/15 1514 07/29/15 1443 08/12/15 1445   ITP Comments Ready for 30 day review. Continue with ITP. Monique Burns since 12/27 visit.  Another stent placement and then got RSV.  30 Day Review.  Continue with ITP.  Monique Burns is ready to return to the program. She showed up today with a large  bruise behind her left knee and calf.  She was sent home to rest the leg and return when the bruise is resolved and not hurting her.  Monique Burns came to class with swelling at her right ankle.  She had recently been for swelling and tightness in the calf of the same leg. She was sent straight to her PMD for assessment.  Monique Burns called today to share that her PMD is not sure what is causing the swelling.  Does not think is a bloodclot.  Might be a cyst that Monique ruptured and needs to be absorbed.  She is staying out of class until  her leg is better and she Monique the OK from her PMD to return to exercise.  30 Day Review. Continue with the ITP.  Monique been out most of this month with problems with her leg. Monique Burns stopped by  to show me her right hand hematoma. She is unable to attend Cardiac Rehab recently due to this. Her leg Baker's Cyst is better but now her problem is her right hand. Monique Burns reports she Monique seen Dr. Caryl Comes twice about this and Dr. Vinetta Bergamo once. Monique Burns said she is suppose to see DR. Callwood on Tuesday and that she is still suppose to take her Monique Burns for her heart.      08/29/15 1006 09/26/15 0934 10/20/15 1523 10/27/15 0808 10/28/15 1745   ITP Comments 30 Day review. Continue with ITP  Last visit 07/08/2015 30 day review. Continue with the ITP. Remains out with medical concerns Pt Monique been out since last medical review. 30 day review.  Continue with ITP  Remains out, Monique been called to check on return to program Monique Burns called back and said she Monique had hand therapy after her hand surgery. Monique Burns said she Monique a doctors appt with Dr. Caryl Comes next week and she bring a return to Cardiac Rehab note.      11/11/15 1356 11/16/15 1426 11/24/15 0854       ITP Comments Monique Burns called and said she is sorry that she can't return to Cardiac Rehab since her liver enzymes are elevated and her MD told her not to return until her follow up appt with him. No attendance since last review. 30 day review. Continue with ITP. reamins Burns  since 3/2 for medical concerns        Comments:

## 2015-11-26 ENCOUNTER — Emergency Department: Payer: Medicare Other

## 2015-11-26 ENCOUNTER — Observation Stay
Admission: EM | Admit: 2015-11-26 | Discharge: 2015-11-27 | Disposition: A | Discharge status: Home / Self Care | Payer: Medicare Other | Attending: Internal Medicine | Admitting: Internal Medicine

## 2015-11-26 DIAGNOSIS — N183 Chronic kidney disease, stage 3 (moderate): Secondary | ICD-10-CM | POA: Diagnosis not present

## 2015-11-26 DIAGNOSIS — R9431 Abnormal electrocardiogram [ECG] [EKG]: Secondary | ICD-10-CM | POA: Diagnosis present

## 2015-11-26 DIAGNOSIS — E785 Hyperlipidemia, unspecified: Secondary | ICD-10-CM | POA: Diagnosis not present

## 2015-11-26 DIAGNOSIS — K219 Gastro-esophageal reflux disease without esophagitis: Secondary | ICD-10-CM | POA: Diagnosis not present

## 2015-11-26 DIAGNOSIS — Z79899 Other long term (current) drug therapy: Secondary | ICD-10-CM | POA: Insufficient documentation

## 2015-11-26 DIAGNOSIS — E039 Hypothyroidism, unspecified: Secondary | ICD-10-CM | POA: Diagnosis not present

## 2015-11-26 DIAGNOSIS — I2510 Atherosclerotic heart disease of native coronary artery without angina pectoris: Secondary | ICD-10-CM | POA: Insufficient documentation

## 2015-11-26 DIAGNOSIS — E46 Unspecified protein-calorie malnutrition: Secondary | ICD-10-CM | POA: Insufficient documentation

## 2015-11-26 DIAGNOSIS — Z955 Presence of coronary angioplasty implant and graft: Secondary | ICD-10-CM | POA: Insufficient documentation

## 2015-11-26 DIAGNOSIS — E43 Unspecified severe protein-calorie malnutrition: Secondary | ICD-10-CM | POA: Insufficient documentation

## 2015-11-26 DIAGNOSIS — I509 Heart failure, unspecified: Secondary | ICD-10-CM | POA: Insufficient documentation

## 2015-11-26 DIAGNOSIS — I252 Old myocardial infarction: Secondary | ICD-10-CM | POA: Diagnosis not present

## 2015-11-26 DIAGNOSIS — R079 Chest pain, unspecified: Secondary | ICD-10-CM | POA: Diagnosis present

## 2015-11-26 DIAGNOSIS — I700 Atherosclerosis of aorta: Secondary | ICD-10-CM | POA: Diagnosis not present

## 2015-11-26 DIAGNOSIS — M1990 Unspecified osteoarthritis, unspecified site: Secondary | ICD-10-CM | POA: Insufficient documentation

## 2015-11-26 DIAGNOSIS — N281 Cyst of kidney, acquired: Secondary | ICD-10-CM | POA: Diagnosis not present

## 2015-11-26 DIAGNOSIS — I130 Hypertensive heart and chronic kidney disease with heart failure and stage 1 through stage 4 chronic kidney disease, or unspecified chronic kidney disease: Secondary | ICD-10-CM | POA: Insufficient documentation

## 2015-11-26 LAB — BASIC METABOLIC PANEL
ANION GAP: 5 (ref 5–15)
BUN: 24 mg/dL — AB (ref 6–20)
CALCIUM: 9.3 mg/dL (ref 8.9–10.3)
CO2: 26 mmol/L (ref 22–32)
CREATININE: 1.28 mg/dL — AB (ref 0.44–1.00)
Chloride: 104 mmol/L (ref 101–111)
GFR calc Af Amer: 44 mL/min — ABNORMAL LOW (ref >60)
GFR, EST NON AFRICAN AMERICAN: 38 mL/min — AB (ref >60)
GLUCOSE: 92 mg/dL (ref 65–99)
Potassium: 4.1 mmol/L (ref 3.5–5.1)
Sodium: 135 mmol/L (ref 135–145)

## 2015-11-26 LAB — HEPATIC FUNCTION PANEL
ALBUMIN: 3.7 g/dL (ref 3.5–5.0)
ALK PHOS: 119 U/L (ref 38–126)
ALT: 26 U/L (ref 14–54)
AST: 32 U/L (ref 15–41)
BILIRUBIN TOTAL: 0.7 mg/dL (ref 0.3–1.2)
Bilirubin, Direct: <0.1 mg/dL — ABNORMAL LOW (ref 0.1–0.5)
TOTAL PROTEIN: 6.9 g/dL (ref 6.5–8.1)

## 2015-11-26 LAB — CBC
HCT: 33.3 % — ABNORMAL LOW (ref 35.0–47.0)
Hemoglobin: 11.4 g/dL — ABNORMAL LOW (ref 12.0–16.0)
MCH: 29.5 pg (ref 26.0–34.0)
MCHC: 34.2 g/dL (ref 32.0–36.0)
MCV: 86.3 fL (ref 80.0–100.0)
PLATELETS: 250 10*3/uL (ref 150–440)
RBC: 3.86 MIL/uL (ref 3.80–5.20)
RDW: 16.6 % — AB (ref 11.5–14.5)
WBC: 3.5 10*3/uL — ABNORMAL LOW (ref 3.6–11.0)

## 2015-11-26 LAB — TROPONIN I
Troponin I: <0.03 ng/mL (ref <0.03)
Troponin I: <0.03 ng/mL (ref <0.03)

## 2015-11-26 LAB — APTT: aPTT: 31 seconds (ref 24–36)

## 2015-11-26 LAB — PROTIME-INR
INR: 1.03
PROTHROMBIN TIME: 13.7 s (ref 11.4–15.0)

## 2015-11-26 LAB — HEPARIN LEVEL (UNFRACTIONATED): Heparin Unfractionated: 0.37 IU/mL (ref 0.30–0.70)

## 2015-11-26 MED ORDER — ISOSORBIDE MONONITRATE ER 30 MG PO TB24
30.0000 mg | ORAL_TABLET | Freq: Every day | ORAL | Status: DC
  Administered 2015-11-26: 30 mg via ORAL
  Filled 2015-11-26: qty 1

## 2015-11-26 MED ORDER — ONDANSETRON HCL 4 MG PO TABS: 4.0000 mg | ORAL_TABLET | Freq: Four times a day (QID) | ORAL | Status: DC | PRN

## 2015-11-26 MED ORDER — SODIUM CHLORIDE 0.9% FLUSH: 3.0000 mL | INTRAVENOUS | Status: DC | PRN

## 2015-11-26 MED ORDER — DULOXETINE HCL 30 MG PO CPEP
30.0000 mg | ORAL_CAPSULE | ORAL | Status: DC
  Administered 2015-11-27: 30 mg via ORAL
  Filled 2015-11-26: qty 1

## 2015-11-26 MED ORDER — SODIUM CHLORIDE 0.9% FLUSH
3.0000 mL | Freq: Two times a day (BID) | INTRAVENOUS | Status: DC
  Administered 2015-11-26 (×2): 3 mL via INTRAVENOUS

## 2015-11-26 MED ORDER — CYCLOSPORINE 0.05 % OP EMUL
1.0000 [drp] | Freq: Two times a day (BID) | OPHTHALMIC | Status: DC
  Administered 2015-11-26 – 2015-11-27 (×2): 1 [drp] via OPHTHALMIC
  Filled 2015-11-26 (×2): qty 1

## 2015-11-26 MED ORDER — ENOXAPARIN SODIUM 40 MG/0.4ML ~~LOC~~ SOLN: 40.0000 mg | SUBCUTANEOUS | Status: DC

## 2015-11-26 MED ORDER — HEPARIN BOLUS VIA INFUSION
3000.0000 [IU] | Freq: Once | INTRAVENOUS | Status: AC
  Administered 2015-11-26: 3000 [IU] via INTRAVENOUS
  Filled 2015-11-26: qty 3000

## 2015-11-26 MED ORDER — HEPARIN (PORCINE) IN NACL 100-0.45 UNIT/ML-% IJ SOLN
550.0000 [IU]/h | INTRAMUSCULAR | Status: DC
  Administered 2015-11-26: 550 [IU]/h via INTRAVENOUS
  Filled 2015-11-26: qty 250

## 2015-11-26 MED ORDER — ONDANSETRON HCL 4 MG/2ML IJ SOLN: 4.0000 mg | Freq: Four times a day (QID) | INTRAMUSCULAR | Status: DC | PRN

## 2015-11-26 MED ORDER — ACETAMINOPHEN 650 MG RE SUPP: 650.0000 mg | Freq: Four times a day (QID) | RECTAL | Status: DC | PRN

## 2015-11-26 MED ORDER — FAMOTIDINE 20 MG PO TABS
20.0000 mg | ORAL_TABLET | Freq: Two times a day (BID) | ORAL | Status: DC
  Administered 2015-11-26 – 2015-11-27 (×2): 20 mg via ORAL
  Filled 2015-11-26 (×2): qty 1

## 2015-11-26 MED ORDER — NITROGLYCERIN 0.4 MG SL SUBL: 0.4000 mg | SUBLINGUAL_TABLET | SUBLINGUAL | Status: DC | PRN

## 2015-11-26 MED ORDER — CALCIUM CARBONATE-VITAMIN D 500-200 MG-UNIT PO TABS
1.0000 | ORAL_TABLET | Freq: Every day | ORAL | Status: DC
  Administered 2015-11-27: 1 via ORAL
  Filled 2015-11-26: qty 1

## 2015-11-26 MED ORDER — NITROGLYCERIN 0.4 MG SL SUBL
0.4000 mg | SUBLINGUAL_TABLET | SUBLINGUAL | Status: DC | PRN
  Administered 2015-11-26 (×2): 0.4 mg via SUBLINGUAL

## 2015-11-26 MED ORDER — ASPIRIN 81 MG PO CHEW
324.0000 mg | CHEWABLE_TABLET | Freq: Once | ORAL | Status: AC
  Administered 2015-11-26: 324 mg via ORAL

## 2015-11-26 MED ORDER — TICAGRELOR 90 MG PO TABS
90.0000 mg | ORAL_TABLET | Freq: Two times a day (BID) | ORAL | Status: DC
  Administered 2015-11-26 – 2015-11-27 (×2): 90 mg via ORAL
  Filled 2015-11-26 (×2): qty 1

## 2015-11-26 MED ORDER — ASPIRIN 81 MG PO CHEW
81.0000 mg | CHEWABLE_TABLET | Freq: Every day | ORAL | Status: DC
  Administered 2015-11-27: 81 mg via ORAL
  Filled 2015-11-26: qty 1

## 2015-11-26 MED ORDER — SODIUM CHLORIDE 0.9 % IV SOLN: 250.0000 mL | INTRAVENOUS | Status: DC | PRN

## 2015-11-26 MED ORDER — ACETAMINOPHEN 325 MG PO TABS
650.0000 mg | ORAL_TABLET | Freq: Four times a day (QID) | ORAL | Status: DC | PRN
  Administered 2015-11-26: 650 mg via ORAL
  Filled 2015-11-26: qty 2

## 2015-11-26 MED ORDER — ENSURE ENLIVE PO LIQD
237.0000 mL | Freq: Two times a day (BID) | ORAL | Status: DC
  Administered 2015-11-26 – 2015-11-27 (×3): 237 mL via ORAL

## 2015-11-26 MED ORDER — METOPROLOL SUCCINATE ER 25 MG PO TB24
12.5000 mg | ORAL_TABLET | Freq: Every day | ORAL | Status: DC
  Administered 2015-11-26: 12.5 mg via ORAL
  Filled 2015-11-26: qty 1

## 2015-11-26 MED ORDER — SODIUM CHLORIDE 0.9% FLUSH
3.0000 mL | Freq: Two times a day (BID) | INTRAVENOUS | Status: DC
  Administered 2015-11-26: 3 mL via INTRAVENOUS

## 2015-11-26 NOTE — Consult Note (Signed)
[facility] Cardiology  CARDIOLOGY CONSULT NOTE  Patient ID: Monique Burns MRN: [id number] DOB/AGE: [birth year] 81 y.o.  Admit date: 11/26/2015 Referring Physician Posey Pronto Primary Physician Caryl Comes Primary Cardiologist [facility] Reason for Consultation Chest pain  HPI: 82 year old female referred for evaluation chest pain. The patient has known coronary artery disease, status post anterior STEMI and stent LAD 02/28/2015, with a repeat DES for in-stent restenosis 05/06/2015. The patient reports a several week history of not feeling well, intermittent episodes of chest pain, occasionally requiring sublingual nitroglycerin. Today, the patient recurrent chest discomfort, took one sublingual nitroglycerin, with ECG revealing nonspecific ST elevation in lead V2 which looks very similar to prior ECG. The patient a second sublingual nitroglycerin with resolution of chest pain. Initial troponin was negative.  Review of systems complete and found to be negative unless listed above     Past Medical History  Diagnosis Date  . Hypertension   . Arthritis   . Thyroid disease   . Hyperlipidemia   . Leukopenia   . GERD (gastroesophageal reflux disease)   . Headache     h/o migraines  . Cancer ([place])     basal cell left leg  . Anemia   . Shortness of breath dyspnea     mild compared to prior stenting-pt states she would have to stop and rest if walking a mile  . Renal disorder     stage 3-Dr Caryl Comes keeping a check on kidney function  . Blood dyscrasia     since being put on Brilinta after stent placement developing hematoma  . MI (myocardial infarction) ([place]) oct 2016  . CHF (congestive heart failure) [facility])     Past Surgical History  Procedure Laterality Date  . Coronary angioplasty with stent placement    . Abdominal hysterectomy    . Eye surgery Bilateral 2011  . Tonsillectomy      3rd grade  . Joint replacement Bilateral     left 2016 and right 2015  . Coronary stent placement  2016    x2  .  Incision and drainage Left 08/26/2015    Procedure: INCISION AND DRAINAGE / HAND HEMATOMA;  Surgeon: Hessie Knows, MD;  Location: [facility] ORS;  Service: Orthopedics;  Laterality: Left;    Prescriptions prior to admission  Medication Sig Dispense Refill Last Dose  . calcium-vitamin D (OSCAL WITH D) 500-200 MG-UNIT tablet Take 1 tablet by mouth daily.   11/25/2015 at 0800  . Cholecalciferol (VITAMIN D3) 1000 UNITS CAPS Take 1 tablet by mouth daily.   11/25/2015 at 0800  . cycloSPORINE (RESTASIS) 0.05 % ophthalmic emulsion Place 1 drop into both eyes 2 (two) times daily.    11/25/2015 at Unknown time  . DULoxetine (CYMBALTA) 30 MG capsule Take 30 mg by mouth every morning.   2 11/25/2015 at Unknown time  . isosorbide mononitrate (IMDUR) 60 MG 24 hr tablet Take 30 mg by mouth at bedtime. Taking half a tablet per md instructions   11/25/2015 at 2000  . metoprolol succinate (TOPROL-XL) 25 MG 24 hr tablet Take 0.5 tablets by mouth at bedtime.    11/25/2015 at 2000  . nitroGLYCERIN (NITROSTAT) 0.4 MG SL tablet Place 0.4 mg under the tongue every 5 (five) minutes as needed.    prn at prn  . polyethylene glycol powder (GLYCOLAX/MIRALAX) powder Take by mouth.   prn at prn  . ranitidine (ZANTAC) 150 MG capsule Take 1 capsule by mouth 2 (two) times daily.   11/26/2015 at 0800  . ticagrelor (BRILINTA)  90 MG TABS tablet Take 90 mg by mouth 2 (two) times daily.   11/26/2015 at 0800   Social History   Social History  . Marital Status: Married    Spouse Name: N/A  . Number of Children: N/A  . Years of Education: N/A   Occupational History  . Not on file.   Social History Main Topics  . Smoking status: Never Smoker   . Smokeless tobacco: Not on file  . Alcohol Use: No  . Drug Use: No  . Sexual Activity: Not on file   Other Topics Concern  . Not on file   Social History Narrative    Family History  Problem Relation Age of Onset  . Breast cancer Paternal Grandmother       Review of systems complete and  found to be negative unless listed above      PHYSICAL EXAM  General: Well developed, well nourished, in no acute distress HEENT:  Normocephalic and atramatic Neck:  No JVD.  Lungs: Clear bilaterally to auscultation and percussion. Heart: HRRR . Normal S1 and S2 without gallops or murmurs.  Abdomen: Bowel sounds are positive, abdomen soft and non-tender  Msk:  Back normal, normal gait. Normal strength and tone for age. Extremities: No clubbing, cyanosis or edema.   Neuro: Alert and oriented X 3. Psych:  Good affect, responds appropriately  Labs:   Lab Results  Component Value Date   WBC 3.5* 11/26/2015   HGB 11.4* 11/26/2015   HCT 33.3* 11/26/2015   MCV 86.3 11/26/2015   PLT 250 11/26/2015    Recent Labs Lab 11/26/15 1001  NA 135  K 4.1  CL 104  CO2 26  BUN 24*  CREATININE 1.28*  CALCIUM 9.3  GLUCOSE 92   Lab Results  Component Value Date   TROPONINI <0.03 11/26/2015   No results found for: CHOL No results found for: HDL No results found for: LDLCALC No results found for: TRIG No results found for: CHOLHDL No results found for: LDLDIRECT    Radiology: Dg Chest 1 View  11/26/2015  CLINICAL DATA:  Acute chest pain radiating to the left arm injury wall. EXAM: CHEST 1 VIEW COMPARISON:  05/05/2015 FINDINGS: Stable hyperinflation without focal pneumonia, collapse or consolidation. Negative for edema, effusion or pneumothorax. Trachea midline. Normal heart size and vascularity. Atherosclerosis of the thoracic aorta. No acute osseous finding. IMPRESSION: Stable hyperinflation.  No superimposed acute process Thoracic aortic atherosclerosis Electronically Signed   By: Jerilynn Mages.  Shick M.D.   On: 11/26/2015 11:37   US Abdomen Complete  11/11/2015  CLINICAL DATA:  Elevated liver enzymes. EXAM: ABDOMEN ULTRASOUND COMPLETE COMPARISON:  None. FINDINGS: Gallbladder: Prominent folds at and near the neck, considered incidental. No gallstones or wall thickening visualized. No sonographic  Murphy sign noted by sonographer. Common bile duct: Diameter: 3 mm Liver: No focal lesion identified. Within normal limits in parenchymal echogenicity. IVC: Distended appearance of the IVC. Doppler tracing not obtained. No cardiomegaly on 05/05/2015 exam. Pancreas: Visualized portion unremarkable. Spleen: Size and appearance within normal limits. Right Kidney: Length: 9 cm without cortical thinning. Echogenicity within normal limits. No solid mass or hydronephrosis visualized. Simple 9 mm lower pole cyst. Left Kidney: Length: 9 cm without cortical thinning. Echogenicity within normal limits. No mass or hydronephrosis visualized. Exophytic simple appearing cyst from the upper pole measuring 34 mm. Abdominal aorta: Diffuse atherosclerotic calcification. Negative for aneurysm. Other findings: None. IMPRESSION: 1. No definitive explanation for elevated liver enzymes. The IVC is distended, consider passive  congestion. 2. Simple bilateral renal cysts. 3.  Aortic Atherosclerosis (ICD10-170.0) Electronically Signed   By: Monte Fantasia M.D.   On: 11/11/2015 08:58    EKG: Normal sinus rhythm, nonspecific, nondiagnostic ST elevation in lead V2  ASSESSMENT AND PLAN:   1. Recurrent chest pain, nondiagnostic ECG, negative troponin. Patient has history of anterior STEMI, DES 03/03/2015, with in-stent renal cysts requiring redo stent 05/06/2015.  Recommendations  1. Continue current medications 2. Continue heparin drip for now 3. The patient remains clinically stable, and rules out for myocardial infarction with negative troponin, proceed with Lexiscan sestamibi study in a.m.  SignedIsaias Cowman MD,PhD, [facility] 11/26/2015, 1:28 PM

## 2015-11-26 NOTE — ED Notes (Signed)
Pt c/o left sided chest pain that radiates into the left arm this morning, states she took 1nitro SL without any relief.

## 2015-11-26 NOTE — Progress Notes (Signed)
ANTICOAGULATION CONSULT NOTE - Initial Consult  Pharmacy Consult for Heparin Indication: chest pain/ACS  No Known Allergies  Patient Measurements: Height: 5\' 8"  (172.7 cm) Weight: 107 lb (48.535 kg) IBW/kg (Calculated) : 63.9 Heparin Dosing Weight: 48.5 kg  Vital Signs: Temp: 98 F (36.7 C) (07/21 0950) Temp Source: Oral (07/21 0950) BP: 146/66 mmHg (07/21 1040) Pulse Rate: 60 (07/21 1040)  Labs:  Recent Labs  11/26/15 1001  HGB 11.4*  HCT 33.3*  PLT 250  APTT 31  LABPROT 13.7  INR 1.03  CREATININE 1.28*  TROPONINI <0.03    Estimated Creatinine Clearance: 25.5 mL/min (by C-G formula based on Cr of 1.28).   Medical History: Past Medical History  Diagnosis Date  . Hypertension   . Arthritis   . Thyroid disease   . Hyperlipidemia   . Leukopenia   . GERD (gastroesophageal reflux disease)   . Headache     h/o migraines  . Cancer ([place])     basal cell left leg  . Anemia   . Shortness of breath dyspnea     mild compared to prior stenting-pt states she would have to stop and rest if walking a mile  . Renal disorder     stage 3-Dr Caryl Comes keeping a check on kidney function  . Blood dyscrasia     since being put on Brilinta after stent placement developing hematoma  . MI (myocardial infarction) ([place]) oct 2016  . CHF (congestive heart failure) ([facility])     Medications:  Infusions:  . sodium chloride    . heparin      Assessment: 82 yo female with hx hypertension, CAD with stent complaining of chest pain for a few days.  Heparin started at recommendation of Cardiology.  Goal of Therapy:  Heparin level 0.3-0.7 units/ml Monitor platelets by anticoagulation protocol: Yes   Plan:  Give 3000 units bolus x 1 Start heparin infusion at 550 units/hr Check anti-Xa level in 8 hours and daily while on heparin Continue to monitor H&H and platelets  Thurmond Hildebran K 11/26/2015,11:34 AM

## 2015-11-26 NOTE — ED Provider Notes (Signed)
[facility] Emergency Department Provider Note  Please note, triage EKG was given to me for review at 10:07 AM, I reviewed it and I'm concerned about the possibility of acute ST abnormalities including potential anterior septal MI. I went to evaluate the patient merely in room 3, however she is at x-ray. Spoken with the patient's nurse and the patient will be transported back to ER bed 3 where I can evaluate her on an emergent basis for potential acute ST elevation MI.  1020AM: Code STEMI paged out. ____________________________________________  Time seen: Approximately 10:18 AM  I have reviewed the triage vital signs and the nursing notes.   HISTORY  Chief Complaint Chest Pain    HPI Monique Burns is a 82 y.o. female with chest pain.  Patient woke up this morning, got out of bed around 6 AM and since that time when she started getting up she started noticing a persistent heavy pressure in her middle chest, radiates to her right arm and up into her neck. She reports similar symptoms when she had a heart attack several months ago where she had a stent placed. She is currently on Brilinta, but was taken off aspirin recently.  No fevers chills or cough. No abdominal pain. She took a nitroglycerin at home with no relief. She continues to have persistent moderate pressure across her chest that will not go away and is rating her neck. She reports she has had this chest pain off and on for the last several days, but it has usually gone away with rest and waiting but today it has been persisting causing her to come for further evaluation.   Past Medical History  Diagnosis Date  . Hypertension   . Arthritis   . Thyroid disease   . Hyperlipidemia   . Leukopenia   . GERD (gastroesophageal reflux disease)   . Headache     h/o migraines  . Cancer ([place])     basal cell left leg  . Anemia   . Shortness of breath dyspnea     mild compared to prior stenting-pt states she  would have to stop and rest if walking a mile  . Renal disorder     stage 3-Dr Caryl Comes keeping a check on kidney function  . Blood dyscrasia     since being put on Brilinta after stent placement developing hematoma  . MI (myocardial infarction) ([place]) oct 2016  . CHF (congestive heart failure) [facility])     Patient Active Problem List   Diagnosis Date Noted  . Chest pain 11/26/2015  . Chronic kidney disease 04/05/2015  . HLD (hyperlipidemia) 04/05/2015  . BP (high blood pressure) 04/05/2015  . Decreased leukocytes 04/05/2015  . Arthritis, degenerative 04/05/2015  . Disease of thyroid gland 04/05/2015  . Acute myocardial infarction of anterior wall ([place]) 02/28/2015  . Degenerative arthritis of hip 10/31/2013    Past Surgical History  Procedure Laterality Date  . Coronary angioplasty with stent placement    . Abdominal hysterectomy    . Eye surgery Bilateral 2011  . Tonsillectomy      3rd grade  . Joint replacement Bilateral     left 2016 and right 2015  . Coronary stent placement  2016    x2  . Incision and drainage Left 08/26/2015    Procedure: INCISION AND DRAINAGE / HAND HEMATOMA;  Surgeon: Hessie Knows, MD;  Location: [facility] ORS;  Service: Orthopedics;  Laterality: Left;    No current outpatient prescriptions on file.  Allergies Review of patient's allergies indicates no known allergies.  Family History  Problem Relation Age of Onset  . Breast cancer Paternal Grandmother     Social History Social History  Substance Use Topics  . Smoking status: Never Smoker   . Smokeless tobacco: None  . Alcohol Use: No    Review of Systems Constitutional: No fever/chills Eyes: No visual changes. ENT: No sore throat.Pain radiates to her neck Cardiovascular: See history of present illness  Respiratory: Denies shortness of breath. Gastrointestinal: No abdominal pain.  No nausea, no vomiting.  No diarrhea.  No constipation. Genitourinary: Negative for dysuria. Musculoskeletal:  Negative for back pain. Skin: Negative for rash. Neurological: Negative for headaches, focal weakness or numbness.  10-point ROS otherwise negative.  ____________________________________________   PHYSICAL EXAM:  VITAL SIGNS: ED Triage Vitals  Enc Vitals Group     BP 11/26/15 0950 148/67 mmHg     Pulse Rate 11/26/15 0950 60     Resp 11/26/15 0950 19     Temp 11/26/15 0950 98 F (36.7 C)     Temp Source 11/26/15 0950 Oral     SpO2 11/26/15 0950 100 %     Weight 11/26/15 0950 107 lb (48.535 kg)     Height 11/26/15 0950 5\' 8"  (1.727 m)     Head Cir --      Peak Flow --      Pain Score 11/26/15 0950 7     Pain Loc --      Pain Edu? --      Excl. in [place]? --    Constitutional: Alert and oriented. Well appearing But does report moderate pain in the mid chest. Eyes: Conjunctivae are normal. PERRL. EOMI. Head: Atraumatic. Nose: No congestion/rhinnorhea. Mouth/Throat: Mucous membranes are moist.  Oropharynx non-erythematous. Neck: No stridor.   Cardiovascular: Normal rate, regular rhythm. Grossly normal heart sounds.  Good peripheral circulation. Respiratory: Normal respiratory effort.  No retractions. Lungs CTAB. Gastrointestinal: Soft and nontender. No distention.  Musculoskeletal: No lower extremity tenderness nor edema.  No joint effusions. Neurologic:  Normal speech and language. No gross focal neurologic deficits are appreciated. Skin:  Skin is warm, dry and intact. No rash noted. Psychiatric: Mood and affect are normal. Speech and behavior are normal.  ____________________________________________   LABS (all labs ordered are listed, but only abnormal results are displayed)  Labs Reviewed  BASIC METABOLIC PANEL - Abnormal; Notable for the following:    BUN 24 (*)    Creatinine, Ser 1.28 (*)    GFR calc non Af Amer 38 (*)    GFR calc Af Amer 44 (*)    All other components within normal limits  CBC - Abnormal; Notable for the following:    WBC 3.5 (*)    Hemoglobin  11.4 (*)    HCT 33.3 (*)    RDW 16.6 (*)    All other components within normal limits  TROPONIN I  PROTIME-INR  APTT  TROPONIN I  TROPONIN I  TROPONIN I  HEPATIC FUNCTION PANEL  HEPARIN LEVEL (UNFRACTIONATED)   ____________________________________________  EKG  EKG is timed 9:47 AM, there was placed upon him on desk but I did not reviewed until seeing the patient's chief complaint. The EKG was interpreted by me at 10:07 AM. Heart rate 60 PR 160 QTc 410 Normal sinus rhythm, acute ST elevation with Q waves is noted in V2, also mild depression noted in V4 and V5. Also minimal attrition loaded in lead 2, and inversion in aVL.  In the setting of the patient's chest pain, this could represent acute ST elevation MI.  A repeat EKG was performed at 10:16 AM Heart rate 60 Care is 100 QTc 4:30 Normal sinus rhythm, again seen persistent mild ST elevation in V2, along with minimal abnormality noted in V5. No significant change compared to previous. ____________________________________________  RADIOLOGY  Final result by Rad Results In Interface (11/26/15 11:37:36)   Narrative:   CLINICAL DATA: Acute chest pain radiating to the left arm injury wall.  EXAM: CHEST 1 VIEW  COMPARISON: 05/05/2015  FINDINGS: Stable hyperinflation without focal pneumonia, collapse or consolidation. Negative for edema, effusion or pneumothorax. Trachea midline. Normal heart size and vascularity. Atherosclerosis of the thoracic aorta. No acute osseous finding.  IMPRESSION: Stable hyperinflation. No superimposed acute process  Thoracic aortic atherosclerosis   Electronically Signed By: Jerilynn Mages. Shick M.D. On: 11/26/2015 11:37       ____________________________________________   PROCEDURES  Procedure(s) performed: None  Critical Care performed: Yes, see critical care note(s)  CRITICAL CARE Performed by: Delman Kitten   Total critical care time: 35 minutes  Critical care time was  exclusive of separately billable procedures and treating other patients.  Critical care was necessary to treat or prevent imminent or life-threatening deterioration.  Critical care was time spent personally by me on the following activities: development of treatment plan with patient and/or surrogate as well as nursing, discussions with consultants, evaluation of patient's response to treatment, examination of patient, obtaining history from patient or surrogate, ordering and performing treatments and interventions, ordering and review of laboratory studies, ordering and review of radiographic studies, pulse oximetry and re-evaluation of patient's condition.  Patient EKG with evidence of acute ST elevation MI and anteroseptal region. Paged out code STEMI. Patient felt to be at high risk for cardiac etiology/morbidity mortality. ____________________________________________   INITIAL IMPRESSION / ASSESSMENT AND PLAN / ED COURSE  Pertinent labs & imaging results that were available during my care of the patient were reviewed by me and considered in my medical decision making (see chart for details).  Patient presents for evaluation of concerning chest pain. She reports midsternal chest pressure with pain rating to the right arm and neck. Unrelieved by nitroglycerin. History of previous STEMI, stenting, on Brilinta and recently removed from aspirin. Upon review the EKG and discussing patient's symptoms, code STEMI was paged out for concerns of potential acute ST elevation MI. Her previous EKG in our system was done at a time that she was evidently experiencing acute ST elevation MI and transferred to [facility], and thus I suspect is possible she is having restenosis or recurrent STEMI based on today's evaluation, however I will defer to her cardiology PCI team/STEMI team for further evaluation as it is slightly unclear to me if this is an obvious ST elevation MI, or these are persistent changes on her EKG.  However, given her symptomatology by primary concern is ruling out acute coronary syndrome including ST elevation MI. Code STEMI was paged at 10:20 AM.  ----------------------------------------- 10:40 AM on 11/26/2015 ----------------------------------------- Dr. Josefa Half arrived to the department to review clinical history and EKGs. Dr. Josefa Half recommends no catheterization at this time, but does feel that possible ACS. He advises following troponins, heparin, and admission but does not recommend emergent catheterization at this time.  ____________________________________________   FINAL CLINICAL IMPRESSION(S) / ED DIAGNOSES  Final diagnoses:  Chest pain, unspecified chest pain type  Abnormal EKG      Delman Kitten, MD 11/26/15 1611

## 2015-11-26 NOTE — H&P (Signed)
[facility] [place] NAME: Monique Burns    MR#:  [id number]  DATE OF BIRTH:  1931-05-08  DATE OF ADMISSION:  11/26/2015  PRIMARY CARE PHYSICIAN: Tama High III, MD   REQUESTING/REFERRING PHYSICIAN: Dr. Jacqualine Code  CHIEF COMPLAINT:   Chief Complaint  Patient presents with  . Chest Pain    HISTORY OF PRESENT ILLNESS: Monique Burns  is a 82 y.o. female with a known history of Hypertension, coronary artery disease with a stent to the LAD in the past, hyperlipidemia, chronic kidney disease, osteoarthritis, hypothyroidism who presents to the ED with complaint of having chest pain/chest pressure. She has been experiencing them symptoms for the past few days. And was seen by her primary care provider. He referred her back to cardiology however patient has not had chance to go see her cardiologist. She again started having chest pressure and also associated headache with it called her primary care provider's office they recommended she come to the emergency room. In the ER patient did have some ST elevation in lead V2 and depression in V5. The ED physician discussed the case with Dr. Montez Hageman shows who reviewed the EKG and did not feel that there was ST MI, he recommended patient be hospitalized. Currently she is not having any chest pain or pressure.        PAST MEDICAL HISTORY:   Past Medical History  Diagnosis Date  . Hypertension   . Arthritis   . Thyroid disease   . Hyperlipidemia   . Leukopenia   . GERD (gastroesophageal reflux disease)   . Headache     h/o migraines  . Cancer ([place])     basal cell left leg  . Anemia   . Shortness of breath dyspnea     mild compared to prior stenting-pt states she would have to stop and rest if walking a mile  . Renal disorder     stage 3-Dr Caryl Comes keeping a check on kidney function  . Blood dyscrasia     since being put on Brilinta after stent placement developing hematoma  . MI (myocardial  infarction) ([place]) oct 2016  . CHF (congestive heart failure) ([place])     PAST SURGICAL HISTORY: Past Surgical History  Procedure Laterality Date  . Coronary angioplasty with stent placement    . Abdominal hysterectomy    . Eye surgery Bilateral 2011  . Tonsillectomy      3rd grade  . Joint replacement Bilateral     left 2016 and right 2015  . Coronary stent placement  2016    x2  . Incision and drainage Left 08/26/2015    Procedure: INCISION AND DRAINAGE / HAND HEMATOMA;  Surgeon: Hessie Knows, MD;  Location: [facility] ORS;  Service: Orthopedics;  Laterality: Left;    SOCIAL HISTORY:  Social History  Substance Use Topics  . Smoking status: Never Smoker   . Smokeless tobacco: Not on file  . Alcohol Use: No    FAMILY HISTORY:  Family History  Problem Relation Age of Onset  . Breast cancer Paternal Grandmother     DRUG ALLERGIES: No Known Allergies  REVIEW OF SYSTEMS:   CONSTITUTIONAL: No fever, fatigue or weakness.  EYES: No blurred or double vision.  EARS, NOSE, AND THROAT: No tinnitus or ear pain.  RESPIRATORY: No cough,  occasional shortness of breath, wheezing or hemoptysis.  CARDIOVASCULAR: Positive chest pain, orthopnea, edema.  GASTROINTESTINAL: No nausea, vomiting, diarrhea or abdominal pain.  GENITOURINARY:  No dysuria, hematuria.  ENDOCRINE: No polyuria, nocturia,  HEMATOLOGY: No anemia, easy bruising or bleeding SKIN: No rash or lesion. MUSCULOSKELETAL: No joint pain or arthritis.   NEUROLOGIC: No tingling, numbness, weakness.  PSYCHIATRY: No anxiety or depression.   MEDICATIONS AT HOME:  Prior to Admission medications   Medication Sig Start Date End Date Taking? Authorizing Provider  calcium-vitamin D (OSCAL WITH D) 500-200 MG-UNIT tablet Take 1 tablet by mouth daily.   Yes Historical Provider, MD  Cholecalciferol (VITAMIN D3) 1000 UNITS CAPS Take 1 tablet by mouth daily.   Yes Historical Provider, MD  cycloSPORINE (RESTASIS) 0.05 % ophthalmic emulsion Place 1  drop into both eyes 2 (two) times daily.  11/17/13  Yes Historical Provider, MD  DULoxetine (CYMBALTA) 30 MG capsule Take 30 mg by mouth every morning.  01/13/15  Yes Historical Provider, MD  isosorbide mononitrate (IMDUR) 60 MG 24 hr tablet Take 30 mg by mouth at bedtime. Taking half a tablet per md instructions 03/15/15 03/14/16 Yes Historical Provider, MD  metoprolol succinate (TOPROL-XL) 25 MG 24 hr tablet Take 0.5 tablets by mouth at bedtime.  03/04/15 03/03/16 Yes Historical Provider, MD  nitroGLYCERIN (NITROSTAT) 0.4 MG SL tablet Place 0.4 mg under the tongue every 5 (five) minutes as needed.  03/03/15 03/02/16 Yes Historical Provider, MD  polyethylene glycol powder (GLYCOLAX/MIRALAX) powder Take by mouth.   Yes Historical Provider, MD  ranitidine (ZANTAC) 150 MG capsule Take 1 capsule by mouth 2 (two) times daily.   Yes Historical Provider, MD  ticagrelor (BRILINTA) 90 MG TABS tablet Take 90 mg by mouth 2 (two) times daily.   Yes Historical Provider, MD      PHYSICAL EXAMINATION:   VITAL SIGNS: Blood pressure 146/66, pulse 60, temperature 98 F (36.7 C), temperature source Oral, resp. rate 16, height 5\' 8"  (1.727 m), weight 48.535 kg (107 lb), SpO2 98 %.  GENERAL:  82 y.o.-year-old patient lying in the bed with no acute distress.  EYES: Pupils equal, round, reactive to light and accommodation. No scleral icterus. Extraocular muscles intact.  HEENT: Head atraumatic, normocephalic. Oropharynx and nasopharynx clear.  NECK:  Supple, no jugular venous distention. No thyroid enlargement, no tenderness.  LUNGS: Normal breath sounds bilaterally, no wheezing, rales,rhonchi or crepitation. No use of accessory muscles of respiration.  CARDIOVASCULAR: S1, S2 normal. No murmurs, rubs, or gallops.  ABDOMEN: Soft, nontender, nondistended. Bowel sounds present. No organomegaly or mass.  EXTREMITIES: No pedal edema, cyanosis, or clubbing.  NEUROLOGIC: Cranial nerves II through XII are intact. Muscle  strength 5/5 in all extremities. Sensation intact. Gait not checked.  PSYCHIATRIC: The patient is alert and oriented x 3.  SKIN: No obvious rash, lesion, or ulcer.   LABORATORY PANEL:   CBC  Recent Labs Lab 11/26/15 1001  WBC 3.5*  HGB 11.4*  HCT 33.3*  PLT 250  MCV 86.3  MCH 29.5  MCHC 34.2  RDW 16.6*   ------------------------------------------------------------------------------------------------------------------  Chemistries   Recent Labs Lab 11/26/15 1001  NA 135  K 4.1  CL 104  CO2 26  GLUCOSE 92  BUN 24*  CREATININE 1.28*  CALCIUM 9.3   ------------------------------------------------------------------------------------------------------------------ estimated creatinine clearance is 25.5 mL/min (by C-G formula based on Cr of 1.28). ------------------------------------------------------------------------------------------------------------------ No results for input(s): TSH, T4TOTAL, T3FREE, THYROIDAB in the last 72 hours.  Invalid input(s): FREET3   Coagulation profile No results for input(s): INR, PROTIME in the last 168 hours. ------------------------------------------------------------------------------------------------------------------- No results for input(s): DDIMER in the last 72 hours. -------------------------------------------------------------------------------------------------------------------  Cardiac Enzymes  Recent Labs Lab 11/26/15 1001  TROPONINI <0.03   ------------------------------------------------------------------------------------------------------------------ Invalid input(s): POCBNP  ---------------------------------------------------------------------------------------------------------------  Urinalysis    Component Value Date/Time   COLORURINE Yellow 03/16/2014 1156   APPEARANCEUR Hazy 03/16/2014 1156   LABSPEC 1.018 03/16/2014 1156   PHURINE 5.0 03/16/2014 1156   GLUCOSEU Negative 03/16/2014 1156   HGBUR  Negative 03/16/2014 1156   BILIRUBINUR Negative 03/16/2014 1156   KETONESUR Negative 03/16/2014 1156   PROTEINUR Negative 03/16/2014 1156   NITRITE Negative 03/16/2014 1156   LEUKOCYTESUR Trace 03/16/2014 1156     RADIOLOGY: No results found.  EKG: Orders placed or performed during the hospital encounter of 11/26/15  . ED EKG within 10 minutes  . ED EKG within 10 minutes    IMPRESSION AND PLAN: Patient is a 82 year old white female presenting with chest pain  1. Chest pain With history of coronary artery disease, we will admit the patient to telemetry Serial cardiac enzymes, cardiology consult They recommended heparin drip which we will start Follow serial enzymes Continue Brilinta, add aspirin continue metoprolol  2. Elevated LFTs as outpatient repeat LFTs ultrasound of the abdomen recently was normal of the liver  3. Hyperlipidemia check a lipid panel in the morning  4. GERD continue H2 blockers  5. Miscellaneous patient will be on heparin drip     All the records are reviewed and case discussed with ED provider. Management plans discussed with the patient, family and they are in agreement.  CODE STATUS:    Code Status Orders        Start     Ordered   11/26/15 1101  Full code   Continuous     11/26/15 1101    Code Status History    Date Active Date Inactive Code Status Order ID Comments User Context   This patient has a current code status but no historical code status.    Advance Directive Documentation        Most Recent Value   Type of Advance Directive  Healthcare Power of Attorney, Living will   Pre-existing out of facility DNR order (yellow form or pink MOST form)     "MOST" Form in Place?         TOTAL TIME TAKING CARE OF THIS PATIENT:55 minutes.    Dustin Flock M.D on 11/26/2015 at 11:07 AM  Between 7am to 6pm - Pager - [phone]  After 6pm go to [email or web address] - password [email or web address] [facility]  [facility]  Office   [phone]  CC: Primary care physician; Adin Hector, MD

## 2015-11-26 NOTE — Progress Notes (Signed)
ANTICOAGULATION CONSULT NOTE - Initial Consult  Pharmacy Consult for Heparin Indication: chest pain/ACS  No Known Allergies  Patient Measurements: Height: 5\' 8"  (172.7 cm) Weight: 109 lb (49.442 kg) IBW/kg (Calculated) : 63.9 Heparin Dosing Weight: 48.5 kg  Vital Signs: Temp: 97.9 F (36.6 C) (07/21 1944) Temp Source: Oral (07/21 1944) BP: 142/56 mmHg (07/21 1944) Pulse Rate: 65 (07/21 2100)  Labs:  Recent Labs  11/26/15 1001 11/26/15 1539 11/26/15 2054  HGB 11.4*  --   --   HCT 33.3*  --   --   PLT 250  --   --   APTT 31  --   --   LABPROT 13.7  --   --   INR 1.03  --   --   HEPARINUNFRC  --   --  0.37  CREATININE 1.28*  --   --   TROPONINI <0.03 <0.03  --     Estimated Creatinine Clearance: 26 mL/min (by C-G formula based on Cr of 1.28).   Medical History: Past Medical History  Diagnosis Date  . Hypertension   . Arthritis   . Thyroid disease   . Hyperlipidemia   . Leukopenia   . GERD (gastroesophageal reflux disease)   . Headache     h/o migraines  . Cancer ([place])     basal cell left leg  . Anemia   . Shortness of breath dyspnea     mild compared to prior stenting-pt states she would have to stop and rest if walking a mile  . Renal disorder     stage 3-Dr Caryl Comes keeping a check on kidney function  . Blood dyscrasia     since being put on Brilinta after stent placement developing hematoma  . MI (myocardial infarction) ([place]) oct 2016  . CHF (congestive heart failure) ([facility])     Medications:  Infusions:  . heparin 550 Units/hr (11/26/15 1220)    Assessment: 82 yo female with hx hypertension, CAD with stent complaining of chest pain for a few days.  Heparin started at recommendation of Cardiology.  Goal of Therapy:  Heparin level 0.3-0.7 units/ml Monitor platelets by anticoagulation protocol: Yes   Plan:  Give 3000 units bolus x 1 Start heparin infusion at 550 units/hr Check anti-Xa level in 8 hours and daily while on heparin Continue to  monitor H&H and platelets   7/21:  HL @ 20:54 = 0.37 .  Will continue this pt on current rate and recheck HL in 8 hrs on 7/22 @ 0500.   Lendon George D 11/26/2015,9:24 PM

## 2015-11-26 NOTE — Progress Notes (Signed)
Initial Nutrition Assessment  DOCUMENTATION CODES:   Severe malnutrition in context of chronic illness, Underweight  INTERVENTION:  -Recommend addition of Ensure Enlive po BID, each supplement provides 350 kcal and 20 grams of protein. Pt would like Ensure made into a milkshake using ice cream; orders placed -Discussed ways of increasing calories without increasing volume of food eaten in order to increase total kcals to promote weight gain/maintenance; pt verbalized understanding. Discussed making Ensure into a milkshake or smoothie using ice cream, yogurt, fruit etc.  -Recommend smaller, more frequent meals; will send Ensure milkshake and small snacks between meals   NUTRITION DIAGNOSIS:   Malnutrition related to chronic illness as evidenced by energy intake < or equal to 75% for > or equal to 1 month, severe depletion of muscle mass, severe depletion of body fat, percent weight loss.  GOAL:   Patient will meet greater than or equal to 90% of their needs  MONITOR:   PO intake, Supplement acceptance, Labs, Weight trends  REASON FOR ASSESSMENT:   Malnutrition Screening Tool    ASSESSMENT:    82 yo female admitted with chest pain with hx of CAD  Pt reports poor appetite for a while, food does not have much taste Reports she has been eating poorly since 2012/2013 which is when she began losing weight. 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 Pt then had bilateral hop surgeries in 2015/2016. Appetite has been poor since; pt does not eat much at tone time. Uses a salad plate as her dinner plate. Pt has been trying to increase her intake, trying to drink Ensure but does not do regularly. Pt reports she had a conversation this week with her primary care MD regarding her wt and ways to increase her intake and promote wt gain (small frequent meals, use of supplements, etc)  Nutrition-Focused physical  exam completed. Findings are mild to severe fat depletion, mild to severe muscle depletion, and no edema.     Past Medical History  Diagnosis Date  . Hypertension   . Arthritis   . Thyroid disease   . Hyperlipidemia   . Leukopenia   . GERD (gastroesophageal reflux disease)   . Headache     h/o migraines  . Cancer ([place])     basal cell left leg  . Anemia   . Shortness of breath dyspnea     mild compared to prior stenting-pt states she would have to stop and rest if walking a mile  . Renal disorder     stage 3-Dr Caryl Comes keeping a check on kidney function  . Blood dyscrasia     since being put on Brilinta after stent placement developing hematoma  . MI (myocardial infarction) ([place]) oct 2016  . CHF (congestive heart failure) [facility])    Past Surgical History  Procedure Laterality Date  . Coronary angioplasty with stent placement    . Abdominal hysterectomy    . Eye surgery Bilateral 2011  . Tonsillectomy      3rd grade  . Joint replacement Bilateral     left 2016 and right 2015  . Coronary stent placement  2016    x2  . Incision and drainage Left 08/26/2015    Procedure: INCISION AND DRAINAGE / HAND HEMATOMA;  Surgeon: Hessie Knows, MD;  Location: [facility] ORS;  Service: Orthopedics;  Laterality: Left;     Diet Order:  Diet Heart Room service appropriate?: Yes;  Fluid consistency:: Thin Diet NPO time specified Except for: Sips with Meds  Skin:  Reviewed, no issues  Last BM:  7/21   Labs: reviewed  Meds: reviewed  Height:   Ht Readings from Last 1 Encounters:  11/26/15 5\' 8"  (1.727 m)    Weight: 14.4% wt loss since October of last year; 10.8% wt loss since December  Wt Readings from Last 1 Encounters:  11/26/15 107 lb (48.535 kg)   Wt Readings from Last 10 Encounters:  11/26/15 107 lb (48.535 kg)  08/26/15 114 lb (51.71 kg)  07/26/15 115 lb (52.164 kg)  04/23/15 120 lb 14.4 oz (54.84 kg)  02/28/15 125 lb (56.7 kg)    BMI:  Body mass index is 16.27  kg/(m^2).  Estimated Nutritional Needs:   Kcal:  [id number] kcals   Protein:  59-74 g   Fluid:  >/= 1.5 L  EDUCATION NEEDS:   Education needs addressed  Kerman Passey MS, [place], LDN [phone] Pager  [phone] Weekend/On-Call Pager

## 2015-11-27 ENCOUNTER — Observation Stay: Payer: Medicare Other

## 2015-11-27 ENCOUNTER — Encounter: Payer: Self-pay | Admitting: Radiology

## 2015-11-27 DIAGNOSIS — E43 Unspecified severe protein-calorie malnutrition: Secondary | ICD-10-CM | POA: Insufficient documentation

## 2015-11-27 DIAGNOSIS — E46 Unspecified protein-calorie malnutrition: Secondary | ICD-10-CM | POA: Insufficient documentation

## 2015-11-27 DIAGNOSIS — R079 Chest pain, unspecified: Secondary | ICD-10-CM | POA: Diagnosis not present

## 2015-11-27 LAB — NM MYOCAR MULTI W/SPECT W/WALL MOTION / EF
CHL CUP NUCLEAR SSS: 6
CSEPED: 1 min
CSEPEDS: 8 s
CSEPEW: 1 METS
CSEPPHR: 80 {beats}/min
LV dias vol: 50 mL (ref 46–106)
LVSYSVOL: 21 mL
Rest HR: 57 {beats}/min
SDS: 3
SRS: 12
TID: 1.06

## 2015-11-27 LAB — BASIC METABOLIC PANEL
ANION GAP: 7 (ref 5–15)
BUN: 26 mg/dL — AB (ref 6–20)
CALCIUM: 8.8 mg/dL — AB (ref 8.9–10.3)
CO2: 24 mmol/L (ref 22–32)
Chloride: 106 mmol/L (ref 101–111)
Creatinine, Ser: 1.21 mg/dL — ABNORMAL HIGH (ref 0.44–1.00)
GFR calc Af Amer: 47 mL/min — ABNORMAL LOW (ref >60)
GFR, EST NON AFRICAN AMERICAN: 40 mL/min — AB (ref >60)
GLUCOSE: 95 mg/dL (ref 65–99)
POTASSIUM: 4.3 mmol/L (ref 3.5–5.1)
Sodium: 137 mmol/L (ref 135–145)

## 2015-11-27 LAB — CBC
HEMATOCRIT: 32.4 % — AB (ref 35.0–47.0)
HEMOGLOBIN: 11.2 g/dL — AB (ref 12.0–16.0)
MCH: 29.4 pg (ref 26.0–34.0)
MCHC: 34.6 g/dL (ref 32.0–36.0)
MCV: 85.1 fL (ref 80.0–100.0)
Platelets: 240 10*3/uL (ref 150–440)
RBC: 3.81 MIL/uL (ref 3.80–5.20)
RDW: 16.4 % — ABNORMAL HIGH (ref 11.5–14.5)
WBC: 3.1 10*3/uL — ABNORMAL LOW (ref 3.6–11.0)

## 2015-11-27 LAB — HEPARIN LEVEL (UNFRACTIONATED): HEPARIN UNFRACTIONATED: 0.36 [IU]/mL (ref 0.30–0.70)

## 2015-11-27 LAB — TROPONIN I: Troponin I: <0.03 ng/mL (ref <0.03)

## 2015-11-27 MED ORDER — TECHNETIUM TC 99M TETROFOSMIN IV KIT
13.0000 | PACK | Freq: Once | INTRAVENOUS | Status: AC | PRN
  Administered 2015-11-27: 12.029 via INTRAVENOUS

## 2015-11-27 MED ORDER — REGADENOSON 0.4 MG/5ML IV SOLN
0.4000 mg | Freq: Once | INTRAVENOUS | Status: AC
  Administered 2015-11-27: 0.4 mg via INTRAVENOUS
  Filled 2015-11-27: qty 5

## 2015-11-27 MED ORDER — TECHNETIUM TC 99M TETROFOSMIN IV KIT
30.0000 | PACK | Freq: Once | INTRAVENOUS | Status: AC | PRN
  Administered 2015-11-27: 30.318 via INTRAVENOUS

## 2015-11-27 NOTE — Discharge Instructions (Signed)
Resume diet and activity as before ° ° °

## 2015-11-27 NOTE — Progress Notes (Signed)
Patient discharged via wheelchair and private vehicle. IV removed and catheter intact. All discharge instructions given and patient verbalizes understanding. Tele removed and returned. No prescriptions given to patient No distress noted.   

## 2015-11-27 NOTE — Progress Notes (Signed)
A&O. No issues through the night. Up with assist to bathroom. On heparin drip. For stress test this AM.

## 2015-11-27 NOTE — Progress Notes (Signed)
ANTICOAGULATION CONSULT NOTE - Initial Consult  Pharmacy Consult for Heparin Indication: chest pain/ACS  No Known Allergies  Patient Measurements: Height: 5\' 8"  (172.7 cm) Weight: 109 lb (49.442 kg) IBW/kg (Calculated) : 63.9 Heparin Dosing Weight: 48.5 kg  Vital Signs: Temp: 98.1 F (36.7 C) (07/22 0425) Temp Source: Oral (07/21 1944) BP: 123/50 mmHg (07/22 0425) Pulse Rate: 50 (07/22 0425)  Labs:  Recent Labs  11/26/15 1001 11/26/15 1539 11/26/15 2054 11/27/15 0339  HGB 11.4*  --   --  11.2*  HCT 33.3*  --   --  32.4*  PLT 250  --   --  240  APTT 31  --   --   --   LABPROT 13.7  --   --   --   INR 1.03  --   --   --   HEPARINUNFRC  --   --  0.37 0.36  CREATININE 1.28*  --   --  1.21*  TROPONINI <0.03 <0.03 <0.03 <0.03    Estimated Creatinine Clearance: 27.5 mL/min (by C-G formula based on Cr of 1.21).   Medical History: Past Medical History  Diagnosis Date  . Hypertension   . Arthritis   . Thyroid disease   . Hyperlipidemia   . Leukopenia   . GERD (gastroesophageal reflux disease)   . Headache     h/o migraines  . Cancer ([place])     basal cell left leg  . Anemia   . Shortness of breath dyspnea     mild compared to prior stenting-pt states she would have to stop and rest if walking a mile  . Renal disorder     stage 3-Dr Caryl Comes keeping a check on kidney function  . Blood dyscrasia     since being put on Brilinta after stent placement developing hematoma  . MI (myocardial infarction) ([place]) oct 2016  . CHF (congestive heart failure) ([facility])     Medications:  Infusions:  . heparin 550 Units/hr (11/26/15 1220)    Assessment: 82 yo female with hx hypertension, CAD with stent complaining of chest pain for a few days.  Heparin started at recommendation of Cardiology.  Goal of Therapy:  Heparin level 0.3-0.7 units/ml Monitor platelets by anticoagulation protocol: Yes   Plan:  Second heparin level therapeutic. Continue current rate. Pharmacy will  continue to monitor daily.  Laural Benes, Pharm.D., BCPS Clinical Pharmacist 11/27/2015,6:41 AM

## 2015-12-02 ENCOUNTER — Encounter: Payer: Self-pay | Admitting: *Deleted

## 2015-12-02 DIAGNOSIS — Z9861 Coronary angioplasty status: Secondary | ICD-10-CM

## 2015-12-02 DIAGNOSIS — I2102 ST elevation (STEMI) myocardial infarction involving left anterior descending coronary artery: Secondary | ICD-10-CM

## 2015-12-02 NOTE — Progress Notes (Signed)
Pt just discharged from hospital for chest pain.  To follow with cardiology tomorrow.

## 2015-12-02 NOTE — Discharge Summary (Signed)
[facility] [place] NAME: Monique Burns    MR#:  [id number]  DATE OF BIRTH:  1931/03/22  DATE OF ADMISSION:  11/26/2015 ADMITTING PHYSICIAN: Dustin Flock, MD  DATE OF DISCHARGE: 11/27/2015  2:47 PM  PRIMARY CARE PHYSICIAN: Tama High III, MD   ADMISSION DIAGNOSIS:  Abnormal EKG [R94.31] Chest pain, unspecified chest pain type [R07.9]  DISCHARGE DIAGNOSIS:  Active Problems:   Chest pain   Protein-calorie malnutrition, severe   SECONDARY DIAGNOSIS:   Past Medical History:  Diagnosis Date  . Anemia   . Arthritis   . Blood dyscrasia    since being put on Brilinta after stent placement developing hematoma  . Cancer ([place])    basal cell left leg  . CHF (congestive heart failure) ([place])   . GERD (gastroesophageal reflux disease)   . Headache    h/o migraines  . Hyperlipidemia   . Hypertension   . Leukopenia   . MI (myocardial infarction) ([place]) oct 2016  . Renal disorder    stage 3-Dr Caryl Comes keeping a check on kidney function  . Shortness of breath dyspnea    mild compared to prior stenting-pt states she would have to stop and rest if walking a mile  . Thyroid disease      ADMITTING HISTORY  HISTORY OF PRESENT ILLNESS: Monique Burns  is a 82 y.o. female with a known history of Hypertension, coronary artery disease with a stent to the LAD in the past, hyperlipidemia, chronic kidney disease, osteoarthritis, hypothyroidism who presents to the ED with complaint of having chest pain/chest pressure. She has been experiencing them symptoms for the past few days. And was seen by her primary care provider. He referred her back to cardiology however patient has not had chance to go see her cardiologist. She again started having chest pressure and also associated headache with it called her primary care provider's office they recommended she come to the emergency room. In the ER patient did have some ST elevation in lead V2 and  depression in V5. The ED physician discussed the case with Dr. Montez Hageman shows who reviewed the EKG and did not feel that there was ST MI, he recommended patient be hospitalized. Currently she is not having any chest pain or pressure.  HOSPITAL COURSE:   * atypical chest pain Patient was treated with heparin drip. Seen by cardiology and advised a stress test. Patient's stress test was thought to be a low risk scan without any ischemic changes by cardiology.  Advised discontinuing heparin drip and discharging home.  Home medications remain the same.  Patient is to follow-up with her cardiologist Dr. Clayborn Bigness in one week. Patient had no further chest pain in the hospital and ambulated without any problem prior to discharge.  CONSULTS OBTAINED:  Treatment Team:  Isaias Cowman, MD  DRUG ALLERGIES:  No Known Allergies  DISCHARGE MEDICATIONS:   Discharge Medication List as of 11/27/2015  2:17 PM    CONTINUE these medications which have NOT CHANGED   Details  calcium-vitamin D (OSCAL WITH D) 500-200 MG-UNIT tablet Take 1 tablet by mouth daily., Until Discontinued, Historical Med    Cholecalciferol (VITAMIN D3) 1000 UNITS CAPS Take 1 tablet by mouth daily., Until Discontinued, Historical Med    cycloSPORINE (RESTASIS) 0.05 % ophthalmic emulsion Place 1 drop into both eyes 2 (two) times daily. , Starting 11/17/2013, Until Discontinued, Historical Med    DULoxetine (CYMBALTA) 30 MG capsule Take 30 mg by mouth  every morning. , Starting 01/13/2015, Until Discontinued, Historical Med    isosorbide mononitrate (IMDUR) 60 MG 24 hr tablet Take 30 mg by mouth at bedtime. Taking half a tablet per md instructions, Starting 03/15/2015, Until Tue 03/14/16, Historical Med    metoprolol succinate (TOPROL-XL) 25 MG 24 hr tablet Take 0.5 tablets by mouth at bedtime. , Starting 03/04/2015, Until Fri 03/03/16, Historical Med    nitroGLYCERIN (NITROSTAT) 0.4 MG SL tablet Place 0.4 mg under the tongue every 5  (five) minutes as needed. , Starting 03/03/2015, Until Thu 03/02/16, Historical Med    polyethylene glycol powder (GLYCOLAX/MIRALAX) powder Take by mouth., Until Discontinued, Historical Med    ranitidine (ZANTAC) 150 MG capsule Take 1 capsule by mouth 2 (two) times daily., Until Discontinued, Historical Med    ticagrelor (BRILINTA) 90 MG TABS tablet Take 90 mg by mouth 2 (two) times daily., Until Discontinued, Historical Med        Today   VITAL SIGNS:  Blood pressure (!) 125/52, pulse 63, temperature 98.2 F (36.8 C), resp. rate 17, height 5\' 8"  (1.727 m), weight 49.4 kg (109 lb), SpO2 100 %.  I/O:  No intake or output data in the 24 hours ending 12/02/15 1320  PHYSICAL EXAMINATION:  Physical Exam  GENERAL:  82 y.o.-year-old patient lying in the bed with no acute distress.  LUNGS: Normal breath sounds bilaterally, no wheezing, rales,rhonchi or crepitation. No use of accessory muscles of respiration.  CARDIOVASCULAR: S1, S2 normal. No murmurs, rubs, or gallops.  ABDOMEN: Soft, non-tender, non-distended. Bowel sounds present. No organomegaly or mass.  NEUROLOGIC: Moves all 4 extremities. PSYCHIATRIC: The patient is alert and oriented x 3.  SKIN: No obvious rash, lesion, or ulcer.   DATA REVIEW:   CBC  Recent Labs Lab 11/27/15 0339  WBC 3.1*  HGB 11.2*  HCT 32.4*  PLT 240    Chemistries   Recent Labs Lab 11/26/15 1539 11/27/15 0339  NA  --  137  K  --  4.3  CL  --  106  CO2  --  24  GLUCOSE  --  95  BUN  --  26*  CREATININE  --  1.21*  CALCIUM  --  8.8*  AST 32  --   ALT 26  --   ALKPHOS 119  --   BILITOT 0.7  --     Cardiac Enzymes  Recent Labs Lab 11/27/15 0339  TROPONINI <0.03    Microbiology Results  No results found for this or any previous visit.  RADIOLOGY:  No results found.  Follow up with PCP in 1 week.  Management plans discussed with the patient, family and they are in agreement.  CODE STATUS:  Code Status History    Date  Active Date Inactive Code Status Order ID Comments User Context   11/26/2015 11:01 AM 11/27/2015  5:47 PM Full Code [id number]  Dustin Flock, MD ED    Advance Directive Documentation   Flowsheet Row Most Recent Value  Type of Advance Directive  Healthcare Power of Attorney, Living will  Pre-existing out of facility DNR order (yellow form or pink MOST form)  No data  "MOST" Form in Place?  No data      TOTAL TIME TAKING CARE OF THIS PATIENT ON DAY OF DISCHARGE: more than 30 minutes.   Hillary Bow R M.D on 12/02/2015 at 1:20 PM  Between 7am to 6pm - Pager - [phone]  After 6pm go to [email or web address] - password [email or web address] [email or web address]  Hospitalists  Office  [phone]  CC: Primary care physician; Adin Hector, MD  Note: This dictation was prepared with Dragon dictation along with smaller phrase technology. Any transcriptional errors that result from this process are unintentional.

## 2015-12-08 ENCOUNTER — Ambulatory Visit: Admit: 2015-12-08 | Payer: Self-pay | Admitting: Internal Medicine

## 2015-12-08 ENCOUNTER — Encounter
Admission: RE | Disposition: A | Discharge status: Home / Self Care | Payer: Self-pay | Source: Ambulatory Visit | Attending: Internal Medicine

## 2015-12-08 ENCOUNTER — Encounter: Payer: Self-pay | Admitting: *Deleted

## 2015-12-08 ENCOUNTER — Ambulatory Visit
Admission: RE | Admit: 2015-12-08 | Discharge: 2015-12-08 | Disposition: A | Discharge status: Home / Self Care | Payer: Medicare Other | Source: Ambulatory Visit | Attending: Internal Medicine | Admitting: Internal Medicine

## 2015-12-08 DIAGNOSIS — D649 Anemia, unspecified: Secondary | ICD-10-CM | POA: Diagnosis not present

## 2015-12-08 DIAGNOSIS — I252 Old myocardial infarction: Secondary | ICD-10-CM | POA: Diagnosis not present

## 2015-12-08 DIAGNOSIS — E079 Disorder of thyroid, unspecified: Secondary | ICD-10-CM | POA: Diagnosis not present

## 2015-12-08 DIAGNOSIS — Z79899 Other long term (current) drug therapy: Secondary | ICD-10-CM | POA: Insufficient documentation

## 2015-12-08 DIAGNOSIS — I509 Heart failure, unspecified: Secondary | ICD-10-CM | POA: Insufficient documentation

## 2015-12-08 DIAGNOSIS — I2510 Atherosclerotic heart disease of native coronary artery without angina pectoris: Secondary | ICD-10-CM | POA: Diagnosis not present

## 2015-12-08 DIAGNOSIS — K219 Gastro-esophageal reflux disease without esophagitis: Secondary | ICD-10-CM | POA: Insufficient documentation

## 2015-12-08 DIAGNOSIS — R0602 Shortness of breath: Secondary | ICD-10-CM | POA: Diagnosis not present

## 2015-12-08 DIAGNOSIS — Z85828 Personal history of other malignant neoplasm of skin: Secondary | ICD-10-CM | POA: Insufficient documentation

## 2015-12-08 DIAGNOSIS — I110 Hypertensive heart disease with heart failure: Secondary | ICD-10-CM | POA: Diagnosis not present

## 2015-12-08 DIAGNOSIS — M1990 Unspecified osteoarthritis, unspecified site: Secondary | ICD-10-CM | POA: Diagnosis not present

## 2015-12-08 DIAGNOSIS — E785 Hyperlipidemia, unspecified: Secondary | ICD-10-CM | POA: Insufficient documentation

## 2015-12-08 DIAGNOSIS — Z955 Presence of coronary angioplasty implant and graft: Secondary | ICD-10-CM | POA: Diagnosis not present

## 2015-12-08 DIAGNOSIS — Z803 Family history of malignant neoplasm of breast: Secondary | ICD-10-CM | POA: Diagnosis not present

## 2015-12-08 DIAGNOSIS — D72819 Decreased white blood cell count, unspecified: Secondary | ICD-10-CM | POA: Insufficient documentation

## 2015-12-08 DIAGNOSIS — Z90710 Acquired absence of both cervix and uterus: Secondary | ICD-10-CM | POA: Diagnosis not present

## 2015-12-08 DIAGNOSIS — Z9660 Presence of unspecified orthopedic joint implant: Secondary | ICD-10-CM | POA: Insufficient documentation

## 2015-12-08 DIAGNOSIS — R079 Chest pain, unspecified: Secondary | ICD-10-CM | POA: Diagnosis present

## 2015-12-08 HISTORY — PX: CARDIAC CATHETERIZATION: SHX172

## 2015-12-08 SURGERY — LEFT HEART CATH AND CORONARY ANGIOGRAPHY
Anesthesia: Moderate Sedation

## 2015-12-08 SURGERY — LEFT HEART CATH AND CORONARY ANGIOGRAPHY
Anesthesia: Moderate Sedation | Laterality: Left

## 2015-12-08 MED ORDER — ONDANSETRON HCL 4 MG/2ML IJ SOLN: 4.0000 mg | Freq: Four times a day (QID) | INTRAMUSCULAR | Status: DC | PRN

## 2015-12-08 MED ORDER — SODIUM CHLORIDE 0.9% FLUSH: 3.0000 mL | Freq: Two times a day (BID) | INTRAVENOUS | Status: DC

## 2015-12-08 MED ORDER — FENTANYL CITRATE (PF) 100 MCG/2ML IJ SOLN
INTRAMUSCULAR | Status: DC | PRN
  Administered 2015-12-08: 12.5 ug via INTRAVENOUS

## 2015-12-08 MED ORDER — SODIUM CHLORIDE 0.9 % WEIGHT BASED INFUSION: 1.0000 mL/kg/h | INTRAVENOUS | Status: DC

## 2015-12-08 MED ORDER — FENTANYL CITRATE (PF) 100 MCG/2ML IJ SOLN
INTRAMUSCULAR | Status: AC
  Filled 2015-12-08: qty 2

## 2015-12-08 MED ORDER — NITROGLYCERIN 5 MG/ML IV SOLN
INTRAVENOUS | Status: AC
  Filled 2015-12-08: qty 10

## 2015-12-08 MED ORDER — IOPAMIDOL (ISOVUE-300) INJECTION 61%
INTRAVENOUS | Status: DC | PRN
  Administered 2015-12-08: 150 mL via INTRA_ARTERIAL

## 2015-12-08 MED ORDER — SODIUM CHLORIDE 0.9 % WEIGHT BASED INFUSION
3.0000 mL/kg/h | INTRAVENOUS | Status: DC
  Administered 2015-12-08: 3 mL/kg/h via INTRAVENOUS

## 2015-12-08 MED ORDER — SODIUM CHLORIDE 0.9% FLUSH: 3.0000 mL | INTRAVENOUS | Status: DC | PRN

## 2015-12-08 MED ORDER — ASPIRIN 81 MG PO CHEW: 81.0000 mg | CHEWABLE_TABLET | ORAL | Status: DC

## 2015-12-08 MED ORDER — HEPARIN (PORCINE) IN NACL 2-0.9 UNIT/ML-% IJ SOLN
INTRAMUSCULAR | Status: AC
  Filled 2015-12-08: qty 500

## 2015-12-08 MED ORDER — MIDAZOLAM HCL 2 MG/2ML IJ SOLN
INTRAMUSCULAR | Status: AC
  Filled 2015-12-08: qty 2

## 2015-12-08 MED ORDER — SODIUM CHLORIDE 0.9 % IV SOLN: 250.0000 mL | INTRAVENOUS | Status: DC | PRN

## 2015-12-08 MED ORDER — MIDAZOLAM HCL 2 MG/2ML IJ SOLN
INTRAMUSCULAR | Status: DC | PRN
  Administered 2015-12-08: 1 mg via INTRAVENOUS

## 2015-12-08 MED ORDER — ASPIRIN 81 MG PO CHEW: 324.0000 mg | CHEWABLE_TABLET | ORAL | Status: DC

## 2015-12-08 MED ORDER — ACETAMINOPHEN 325 MG PO TABS: 650.0000 mg | ORAL_TABLET | ORAL | Status: DC | PRN

## 2015-12-08 MED ORDER — NITROGLYCERIN 0.4 MG SL SUBL
0.4000 mg | SUBLINGUAL_TABLET | SUBLINGUAL | Status: DC | PRN
  Administered 2015-12-08: 0.4 mg via SUBLINGUAL

## 2015-12-08 MED ORDER — AMLODIPINE BESYLATE 5 MG PO TABS
5.0000 mg | ORAL_TABLET | Freq: Every day | ORAL | Status: DC
  Administered 2015-12-08: 5 mg via ORAL

## 2015-12-08 MED ORDER — NITROGLYCERIN 0.4 MG SL SUBL
SUBLINGUAL_TABLET | SUBLINGUAL | Status: AC
  Administered 2015-12-08: 0.4 mg via SUBLINGUAL
  Filled 2015-12-08: qty 1

## 2015-12-08 MED ORDER — NITROGLYCERIN 1 MG/10 ML FOR IR/CATH LAB
INTRA_ARTERIAL | Status: DC | PRN
  Administered 2015-12-08: 200 ug via INTRACORONARY

## 2015-12-08 SURGICAL SUPPLY — 10 items
CATH INFINITI 5FR ANG PIGTAIL (CATHETERS) ×3 IMPLANT
CATH INFINITI 5FR JL4 (CATHETERS) ×3 IMPLANT
CATH INFINITI JR4 5F (CATHETERS) ×3 IMPLANT
DEVICE CLOSURE MYNXGRIP 5F (Vascular Products) ×3 IMPLANT
GAUZE SPONGE 4X4 12PLY STRL (GAUZE/BANDAGES/DRESSINGS) ×3 IMPLANT
KIT MANI 3VAL PERCEP (MISCELLANEOUS) ×3 IMPLANT
NEEDLE PERC 18GX7CM (NEEDLE) ×3 IMPLANT
PACK CARDIAC CATH (CUSTOM PROCEDURE TRAY) ×3 IMPLANT
SHEATH AVANTI 5FR X 11CM (SHEATH) ×3 IMPLANT
WIRE EMERALD 3MM-J .035X150CM (WIRE) ×3 IMPLANT

## 2015-12-08 NOTE — Discharge Instructions (Signed)

## 2015-12-10 ENCOUNTER — Encounter: Payer: Self-pay | Admitting: Internal Medicine

## 2015-12-15 ENCOUNTER — Telehealth: Payer: Self-pay | Admitting: *Deleted

## 2015-12-15 NOTE — Telephone Encounter (Signed)
Called to check on status to return to program.   Going to [facility] in a week for another CATH to see if needs CABG/PCI Continues to have symptoms.

## 2015-12-16 ENCOUNTER — Encounter: Payer: Self-pay | Admitting: *Deleted

## 2015-12-16 DIAGNOSIS — Z9861 Coronary angioplasty status: Secondary | ICD-10-CM

## 2015-12-16 DIAGNOSIS — I2102 ST elevation (STEMI) myocardial infarction involving left anterior descending coronary artery: Secondary | ICD-10-CM

## 2015-12-22 ENCOUNTER — Encounter: Payer: Self-pay | Admitting: *Deleted

## 2015-12-22 DIAGNOSIS — Z9861 Coronary angioplasty status: Secondary | ICD-10-CM

## 2015-12-22 DIAGNOSIS — I2102 ST elevation (STEMI) myocardial infarction involving left anterior descending coronary artery: Secondary | ICD-10-CM

## 2015-12-22 NOTE — Progress Notes (Signed)
Cardiac Individual Treatment Plan  Patient Details  Name: Monique Burns MRN: [id number] Date of Birth: 1930-06-02 Referring Provider:    Initial Encounter Date:  Flowsheet Row Cardiac Rehab from 04/23/2015 in [facility] Cardiac and Pulmonary Rehab  Date  04/23/15      Visit Diagnosis: S/P PTCA (percutaneous transluminal coronary angioplasty)  ST elevation (STEMI) myocardial infarction involving left anterior descending coronary artery ([place])  Patient's Home Medications on Admission:  Current Outpatient Prescriptions:  .  acetaminophen (TYLENOL) 500 MG tablet, Take 500 mg by mouth every 6 (six) hours as needed for pain., Disp: , Rfl:  .  calcium-vitamin D (OSCAL WITH D) 500-200 MG-UNIT tablet, Take 1 tablet by mouth daily., Disp: , Rfl:  .  Cholecalciferol (VITAMIN D3) 1000 UNITS CAPS, Take 1 tablet by mouth daily., Disp: , Rfl:  .  cycloSPORINE (RESTASIS) 0.05 % ophthalmic emulsion, Place 1 drop into both eyes 2 (two) times daily. , Disp: , Rfl:  .  DULoxetine (CYMBALTA) 30 MG capsule, Take 30 mg by mouth every morning. , Disp: , Rfl: 2 .  isosorbide mononitrate (IMDUR) 60 MG 24 hr tablet, Take 30 mg by mouth at bedtime. Taking half a tablet per md instructions, Disp: , Rfl:  .  metoprolol succinate (TOPROL-XL) 25 MG 24 hr tablet, Take 12.5 mg by mouth at bedtime. , Disp: , Rfl:  .  nitroGLYCERIN (NITROSTAT) 0.4 MG SL tablet, Place 0.4 mg under the tongue every 5 (five) minutes as needed for chest pain. , Disp: , Rfl:  .  ranitidine (ZANTAC) 150 MG capsule, Take 1 capsule by mouth 2 (two) times daily., Disp: , Rfl:  .  ticagrelor (BRILINTA) 90 MG TABS tablet, Take 90 mg by mouth 2 (two) times daily., Disp: , Rfl:   Past Medical History: Past Medical History:  Diagnosis Date  . Anemia   . Arthritis   . Blood dyscrasia    since being put on Brilinta after stent placement developing hematoma  . Cancer ([place])    basal cell left leg  . CHF (congestive heart failure) ([place])   . GERD  (gastroesophageal reflux disease)   . Headache    h/o migraines  . Hyperlipidemia   . Hypertension   . Leukopenia   . MI (myocardial infarction) ([place]) oct 2016  . Renal disorder    stage 3-Dr Caryl Comes keeping a check on kidney function  . Shortness of breath dyspnea    mild compared to prior stenting-pt states she would have to stop and rest if walking a mile  . Thyroid disease     Tobacco Use: History  Smoking Status  . Never Smoker  Smokeless Tobacco  . Not on file    Labs: Recent Review Flowsheet Data    There is no flowsheet data to display.       Exercise Target Goals:    Exercise Program Goal: Individual exercise prescription set with THRR, safety & activity barriers. Participant demonstrates ability to understand and report RPE using BORG scale, to self-measure pulse accurately, and to acknowledge the importance of the exercise prescription.  Exercise Prescription Goal: Starting with aerobic activity 30 plus minutes a day, 3 days per week for initial exercise prescription. Provide home exercise prescription and guidelines that participant acknowledges understanding prior to discharge.  Activity Barriers & Risk Stratification:   6 Minute Walk:   Initial Exercise Prescription:   Perform Capillary Blood Glucose checks as needed.  Exercise Prescription Changes:     Exercise Prescription Changes  Row Name 06/29/15 0700 07/08/15 [id number] 07/08/15 [id number]         Exercise Review   Progression No  Absent since 05/04/15  - No  Absent since 05/04/15       Response to Exercise   Blood Pressure (Admit)  - 120/54  -     Blood Pressure (Exercise)  - 142/64  -     Blood Pressure (Exit)  - 132/68  -     Heart Rate (Admit)  - 67 bpm  -     Heart Rate (Exercise)  - 85 bpm  -     Heart Rate (Exit)  - 57 bpm  -     Comments Patient absent since 05/03/16, workloads will be re-evaluated upon return based on fitness and function Patient has not attending Heart Track  recently related to recent onset of leg pain.  Goal is to resume Heart Track once leg difficulty is resolved. Patient absent since 05/03/16, workloads will be re-evaluated upon return based on fitness and function     Duration Progress to 30 minutes of continuous aerobic without signs/symptoms of physical distress  - Progress to 30 minutes of continuous aerobic without signs/symptoms of physical distress     Intensity THRR unchanged  - THRR unchanged       Progression   Progression Continue progressive overload as per policy without signs/symptoms or physical distress.  - Continue progressive overload as per policy without signs/symptoms or physical distress.       Resistance Training   Training Prescription (read-only) Yes  -  -     Weight (read-only) 2  -  -     Reps (read-only) 10-15  -  -       Interval Training   Interval Training No  - No       Treadmill   MPH (read-only) 1  -  -     Grade (read-only) 0  -  -     Minutes (read-only) 15  -  -       NuStep   Level (read-only) 4  -  -     Watts (read-only) 20  -  -     Minutes (read-only) 15  -  -        Exercise Comments:     Exercise Comments    Row Name 08/27/15 1145 09/20/15 1344 10/20/15 1523 11/16/15 1426 12/02/15 1443   Exercise Comments Sanaah has not attended Heart Track since 07-08-15.  Goal will be to resume Heart Track once she is able. Jaleiya has not attended Heart Track since 07-08-15. Pt has been out since last medical review. No attendance since last review. Pt has been out since last review.   [place] Name 12/16/15 1437           Exercise Comments Pt has been out since last review.          Discharge Exercise Prescription (Final Exercise Prescription Changes):     Exercise Prescription Changes - 07/08/15 0952      Exercise Review   Progression No  Absent since 05/04/15     Response to Exercise   Comments Patient absent since 05/03/16, workloads will be re-evaluated upon return based on fitness and function    Duration Progress to 30 minutes of continuous aerobic without signs/symptoms of physical distress   Intensity THRR unchanged     Progression   Progression Continue progressive overload as per policy without signs/symptoms or physical distress.  Interval Training   Interval Training No      Nutrition:  Target Goals: Understanding of nutrition guidelines, daily intake of sodium 1500mg , cholesterol 200mg , calories 30% from fat and 7% or less from saturated fats, daily to have 5 or more servings of fruits and vegetables.  Biometrics:    Nutrition Therapy Plan and Nutrition Goals:   Nutrition Discharge: Rate Your Plate Scores:   Nutrition Goals Re-Evaluation:   Psychosocial: Target Goals: Acknowledge presence or absence of depression, maximize coping skills, provide positive support system. Participant is able to verbalize types and ability to use techniques and skills needed for reducing stress and depression.  Initial Review & Psychosocial Screening:   Quality of Life Scores:   PHQ-9: Recent Review Flowsheet Data    Depression screen [facility] 2/9 04/05/2015   Decreased Interest 1   Down, Depressed, Hopeless 0   PHQ - 2 Score 1   Altered sleeping 1   Tired, decreased energy 2   Change in appetite 2   Feeling bad or failure about yourself  0   Trouble concentrating 0   Moving slowly or fidgety/restless 0   Suicidal thoughts 0   PHQ-9 Score 6   Difficult doing work/chores Not difficult at all      Psychosocial Evaluation and Intervention:   Psychosocial Re-Evaluation:     Psychosocial Re-Evaluation    [place] Name 08/12/15 1447 11/11/15 1357           Psychosocial Re-Evaluation   Interventions Encouraged to attend Cardiac Rehabilitation for the exercise Encouraged to attend Cardiac Rehabilitation for the exercise      Comments Jaydaliz reports she has had to go to multiple doctors appts lately and she is sorry that she hasn't been able to exercise in Cardiac  Rehab lately due to her right hand hematoma.  Stormee called and said she is sorry that she can't return to Cardiac Rehab since her liver enzymes are elevated and her MD told her not to return until her follow up appt with him. Zykira said she is tired of being sick.         Vocational Rehabilitation: Provide vocational rehab assistance to qualifying candidates.   Vocational Rehab Evaluation & Intervention:   Education: Education Goals: Education classes will be provided on a weekly basis, covering required topics. Participant will state understanding/return demonstration of topics presented.  Learning Barriers/Preferences:   Education Topics: General Nutrition Guidelines/Fats and Fiber: -Group instruction provided by verbal, written material, models and posters to present the general guidelines for heart healthy nutrition. Gives an explanation and review of dietary fats and fiber.   Controlling Sodium/Reading Food Labels: -Group verbal and written material supporting the discussion of sodium use in heart healthy nutrition. Review and explanation with models, verbal and written materials for utilization of the food label.   Exercise Physiology & Risk Factors: - Group verbal and written instruction with models to review the exercise physiology of the cardiovascular system and associated critical values. Details cardiovascular disease risk factors and the goals associated with each risk factor. Flowsheet Row Cardiac Rehab from 07/08/2015 in [facility] Cardiac and Pulmonary Rehab  Date  07/06/15  Educator  BS  Instruction Review Code  2- meets goals/outcomes      Aerobic Exercise & Resistance Training: - Gives group verbal and written discussion on the health impact of inactivity. On the components of aerobic and resistive training programs and the benefits of this training and how to safely progress through these programs. Flowsheet Row Cardiac  Rehab from 07/08/2015 in [facility] Cardiac and Pulmonary  Rehab  Date  07/08/15  Educator  BS  Instruction Review Code  2- meets goals/outcomes      Flexibility, Balance, General Exercise Guidelines: - Provides group verbal and written instruction on the benefits of flexibility and balance training programs. Provides general exercise guidelines with specific guidelines to those with heart or lung disease. Demonstration and skill practice provided.   Stress Management: - Provides group verbal and written instruction about the health risks of elevated stress, cause of high stress, and healthy ways to reduce stress.   Depression: - Provides group verbal and written instruction on the correlation between heart/lung disease and depressed mood, treatment options, and the stigmas associated with seeking treatment. Flowsheet Row Cardiac Rehab from 07/08/2015 in [facility] Cardiac and Pulmonary Rehab  Date  07/01/15  Educator  CE  Instruction Review Code  2- meets goals/outcomes      Anatomy & Physiology of the Heart: - Group verbal and written instruction and models provide basic cardiac anatomy and physiology, with the coronary electrical and arterial systems. Review of: AMI, Angina, Valve disease, Heart Failure, Cardiac Arrhythmia, Pacemakers, and the ICD.   Cardiac Procedures: - Group verbal and written instruction and models to describe the testing methods done to diagnose heart disease. Reviews the outcomes of the test results. Describes the treatment choices: Medical Management, Angioplasty, or Coronary Bypass Surgery. Flowsheet Row Cardiac Rehab from 07/08/2015 in [facility] Cardiac and Pulmonary Rehab  Date  04/27/15  Educator  DW  Instruction Review Code  2- meets goals/outcomes      Cardiac Medications: - Group verbal and written instruction to review commonly prescribed medications for heart disease. Reviews the medication, class of the drug, and side effects. Includes the steps to properly store meds and maintain the prescription regimen.   Go  Sex-Intimacy & Heart Disease, Get SMART - Goal Setting: - Group verbal and written instruction through game format to discuss heart disease and the return to sexual intimacy. Provides group verbal and written material to discuss and apply goal setting through the application of the S.M.A.R.T. Method. Flowsheet Row Cardiac Rehab from 07/08/2015 in [facility] Cardiac and Pulmonary Rehab  Date  04/27/15  Educator  DW  Instruction Review Code  2- meets goals/outcomes      Other Matters of the Heart: - Provides group verbal, written materials and models to describe Heart Failure, Angina, Valve Disease, and Diabetes in the realm of heart disease. Includes description of the disease process and treatment options available to the cardiac patient.   Exercise & Equipment Safety: - Individual verbal instruction and demonstration of equipment use and safety with use of the equipment. Flowsheet Row Cardiac Rehab from 07/08/2015 in [facility] Cardiac and Pulmonary Rehab  Date  04/05/15  Educator  SB  Instruction Review Code  2- meets goals/outcomes      Infection Prevention: - Provides verbal and written material to individual with discussion of infection control including proper hand washing and proper equipment cleaning during exercise session. Flowsheet Row Cardiac Rehab from 07/08/2015 in [facility] Cardiac and Pulmonary Rehab  Date  04/05/15  Educator  SB  Instruction Review Code  2- meets goals/outcomes      Falls Prevention: - Provides verbal and written material to individual with discussion of falls prevention and safety. Flowsheet Row Cardiac Rehab from 07/08/2015 in [facility] Cardiac and Pulmonary Rehab  Date  04/05/15  Educator  SB  Instruction Review Code  2- meets goals/outcomes  Diabetes: - Individual verbal and written instruction to review signs/symptoms of diabetes, desired ranges of glucose level fasting, after meals and with exercise. Advice that pre and post exercise glucose checks will be done  for 3 sessions at entry of program.    Knowledge Questionnaire Score:   Core Components/Risk Factors/Patient Goals at Admission:   Core Components/Risk Factors/Patient Goals Review:      Goals and Risk Factor Review    Row Name 08/12/15 1447             Core Components/Risk Factors/Patient Goals Review   Personal Goals Review Sedentary       Review Makaleigh Hausch stopped by to show me her right hand hematoma. She is unable to attend Cardiac Rehab recently due to this. Her leg Baker's Cyst is better but now her problem is her right hand. Maiysha reports she has seen Dr. Caryl Comes twice about this and Dr. Vinetta Bergamo once. Lameeka said she is suppose to see DR. Callwood on Tuesday and that she is still suppose to take her Epifania Gore for her heart          Core Components/Risk Factors/Patient Goals at Discharge (Final Review):      Goals and Risk Factor Review - 08/12/15 1447      Core Components/Risk Factors/Patient Goals Review   Personal Goals Review Sedentary   Review Janith Francoeur stopped by to show me her right hand hematoma. She is unable to attend Cardiac Rehab recently due to this. Her leg Baker's Cyst is better but now her problem is her right hand. Morgon reports she has seen Dr. Caryl Comes twice about this and Dr. Vinetta Bergamo once. [place] said she is suppose to see DR. Callwood on Tuesday and that she is still suppose to take her [place] for her heart      ITP Comments:     ITP Comments    Row Name 06/29/15 0852 07/15/15 1514 07/29/15 1443 08/12/15 1445 08/29/15 1006   ITP Comments 30 Day Review.  Continue with ITP.  Jettie is ready to return to the program. She showed up today with a large bruise behind her left knee and calf.  She was sent home to rest the leg and return when the bruise is resolved and not hurting her.  Vaness came to class with swelling at her right ankle.  She had recently been for swelling and tightness in the calf of the same leg. She was sent straight to her PMD for assessment.   Yolinda called today to share that her PMD is not sure what is causing the swelling.  Does not think is a bloodclot.  Might be a cyst that has ruptured and needs to be absorbed.  She is staying out of class until  her leg is better and she has the OK from her PMD to return to exercise.  30 Day Review. Continue with the ITP.  Has been out most of this month with problems with her leg. Ajayla Deus stopped by to show me her right hand hematoma. She is unable to attend Cardiac Rehab recently due to this. Her leg Baker's Cyst is better but now her problem is her right hand. Rhina reports she has seen Dr. Caryl Comes twice about this and Dr. Vinetta Bergamo once. Rees said she is suppose to see DR. Callwood on Tuesday and that she is still suppose to take her [place] for her heart.  30 Day review. Continue with ITP  Last visit 07/08/2015   Row Name 09/26/15 [phone]  10/20/15 1523 10/27/15 0808 10/28/15 1745 11/11/15 1356   ITP Comments 30 day review. Continue with the ITP. Remains out with medical concerns Pt has been out since last medical review. 30 day review.  Continue with ITP  Remains out, has been called to check on return to program Kynnleigh called back and said she has had hand therapy after her hand surgery. Renleigh said she has a doctors appt with Dr. Caryl Comes next week and she bring a return to Cardiac Rehab note.  Toluwanimi called and said she is sorry that she can't return to Cardiac Rehab since her liver enzymes are elevated and her MD told her not to return until her follow up appt with him.   [place] Name 11/16/15 1426 11/24/15 0854 12/02/15 1442 12/16/15 1437 12/22/15 0718   ITP Comments No attendance since last review. 30 day review. Continue with ITP. reamins absent since 3/2 for medical concerns Pt has been out since last review.  Just discharged from hospital for chest pain.  To follow with cardiology tomorrow. Pt going to [facility] for cath and consideration for CABG. 30 day review. Continue with ITP unless changes noted by Medical Director  at signature of review.      Comments:

## 2015-12-28 ENCOUNTER — Encounter: Payer: Self-pay | Admitting: *Deleted

## 2015-12-28 DIAGNOSIS — I2102 ST elevation (STEMI) myocardial infarction involving left anterior descending coronary artery: Secondary | ICD-10-CM

## 2015-12-28 DIAGNOSIS — Z9861 Coronary angioplasty status: Secondary | ICD-10-CM

## 2015-12-28 DIAGNOSIS — Z951 Presence of aortocoronary bypass graft: Secondary | ICD-10-CM | POA: Insufficient documentation

## 2015-12-28 DIAGNOSIS — D696 Thrombocytopenia, unspecified: Secondary | ICD-10-CM | POA: Insufficient documentation

## 2015-12-28 NOTE — Progress Notes (Signed)
Cardiac Individual Treatment Plan  Patient Details  Name: Monique Burns MRN: [id number] Date of Birth: 09/24/1930 Referring Provider:    Initial Encounter Date:  Flowsheet Row Cardiac Rehab from 04/23/2015 in [facility] Cardiac and Pulmonary Rehab  Date  04/23/15      Visit Diagnosis: ST elevation (STEMI) myocardial infarction involving left anterior descending coronary artery (HCC)  S/P PTCA (percutaneous transluminal coronary angioplasty)  Patient's Home Medications on Admission:  Current Outpatient Prescriptions:  .  acetaminophen (TYLENOL) 500 MG tablet, Take 500 mg by mouth every 6 (six) hours as needed for pain., Disp: , Rfl:  .  calcium-vitamin D (OSCAL WITH D) 500-200 MG-UNIT tablet, Take 1 tablet by mouth daily., Disp: , Rfl:  .  Cholecalciferol (VITAMIN D3) 1000 UNITS CAPS, Take 1 tablet by mouth daily., Disp: , Rfl:  .  cycloSPORINE (RESTASIS) 0.05 % ophthalmic emulsion, Place 1 drop into both eyes 2 (two) times daily. , Disp: , Rfl:  .  DULoxetine (CYMBALTA) 30 MG capsule, Take 30 mg by mouth every morning. , Disp: , Rfl: 2 .  isosorbide mononitrate (IMDUR) 60 MG 24 hr tablet, Take 30 mg by mouth at bedtime. Taking half a tablet per md instructions, Disp: , Rfl:  .  metoprolol succinate (TOPROL-XL) 25 MG 24 hr tablet, Take 12.5 mg by mouth at bedtime. , Disp: , Rfl:  .  nitroGLYCERIN (NITROSTAT) 0.4 MG SL tablet, Place 0.4 mg under the tongue every 5 (five) minutes as needed for chest pain. , Disp: , Rfl:  .  ranitidine (ZANTAC) 150 MG capsule, Take 1 capsule by mouth 2 (two) times daily., Disp: , Rfl:  .  ticagrelor (BRILINTA) 90 MG TABS tablet, Take 90 mg by mouth 2 (two) times daily., Disp: , Rfl:   Past Medical History: Past Medical History:  Diagnosis Date  . Anemia   . Arthritis   . Blood dyscrasia    since being put on Brilinta after stent placement developing hematoma  . Cancer ([place])    basal cell left leg  . CHF (congestive heart failure) ([place])   . GERD  (gastroesophageal reflux disease)   . Headache    h/o migraines  . Hyperlipidemia   . Hypertension   . Leukopenia   . MI (myocardial infarction) ([place]) oct 2016  . Renal disorder    stage 3-Dr Caryl Comes keeping a check on kidney function  . Shortness of breath dyspnea    mild compared to prior stenting-pt states she would have to stop and rest if walking a mile  . Thyroid disease     Tobacco Use: History  Smoking Status  . Never Smoker  Smokeless Tobacco  . Not on file    Labs: Recent Review Flowsheet Data    There is no flowsheet data to display.       Exercise Target Goals:    Exercise Program Goal: Individual exercise prescription set with THRR, safety & activity barriers. Participant demonstrates ability to understand and report RPE using BORG scale, to self-measure pulse accurately, and to acknowledge the importance of the exercise prescription.  Exercise Prescription Goal: Starting with aerobic activity 30 plus minutes a day, 3 days per week for initial exercise prescription. Provide home exercise prescription and guidelines that participant acknowledges understanding prior to discharge.  Activity Barriers & Risk Stratification:   6 Minute Walk:   Initial Exercise Prescription:   Perform Capillary Blood Glucose checks as needed.  Exercise Prescription Changes:      Exercise Prescription Changes  Monique Burns Name 07/08/15 [phone] 07/08/15 0952           Exercise Review   Progression  - No  Absent since 05/04/15        Response to Exercise   Blood Pressure (Admit) 120/54  -      Blood Pressure (Exercise) 142/64  -      Blood Pressure (Exit) 132/68  -      Heart Rate (Admit) 67 bpm  -      Heart Rate (Exercise) 85 bpm  -      Heart Rate (Exit) 57 bpm  -      Comments Patient has not attending Heart Track recently related to recent onset of leg pain.  Goal is to resume Heart Track once leg difficulty is resolved. Patient absent since 05/03/16, workloads will  be re-evaluated upon return based on fitness and function      Duration  - Progress to 30 minutes of continuous aerobic without signs/symptoms of physical distress      Intensity  - THRR unchanged        Progression   Progression  - Continue progressive overload as per policy without signs/symptoms or physical distress.        Interval Training   Interval Training  - No         Exercise Comments:      Exercise Comments    Row Name 08/27/15 1145 09/20/15 1344 10/20/15 1523 11/16/15 1426 12/02/15 1443   Exercise Comments Monique Burns has not attended Heart Track since 07-08-15.  Goal will be to resume Heart Track once she is able. Quantisha has not attended Heart Track since 07-08-15. Pt has been out since last medical review. No attendance since last review. Pt has been out since last review.   Monique Burns Name 12/16/15 1437           Exercise Comments Pt has been out since last review.          Discharge Exercise Prescription (Final Exercise Prescription Changes):     Exercise Prescription Changes - 07/08/15 0952      Exercise Review   Progression No  Absent since 05/04/15     Response to Exercise   Comments Patient absent since 05/03/16, workloads will be re-evaluated upon return based on fitness and function   Duration Progress to 30 minutes of continuous aerobic without signs/symptoms of physical distress   Intensity THRR unchanged     Progression   Progression Continue progressive overload as per policy without signs/symptoms or physical distress.     Interval Training   Interval Training No      Nutrition:  Target Goals: Understanding of nutrition guidelines, daily intake of sodium 1500mg , cholesterol 200mg , calories 30% from fat and 7% or less from saturated fats, daily to have 5 or more servings of fruits and vegetables.  Biometrics:    Nutrition Therapy Plan and Nutrition Goals:   Nutrition Discharge: Rate Your Plate Scores:   Nutrition Goals  Re-Evaluation:   Psychosocial: Target Goals: Acknowledge presence or absence of depression, maximize coping skills, provide positive support system. Participant is able to verbalize types and ability to use techniques and skills needed for reducing stress and depression.  Initial Review & Psychosocial Screening:   Quality of Life Scores:   PHQ-9: Recent Review Flowsheet Data    Depression screen [facility] 2/9 04/05/2015   Decreased Interest 1   Down, Depressed, Hopeless 0   PHQ - 2 Score 1   Altered sleeping  1   Tired, decreased energy 2   Change in appetite 2   Feeling bad or failure about yourself  0   Trouble concentrating 0   Moving slowly or fidgety/restless 0   Suicidal thoughts 0   PHQ-9 Score 6   Difficult doing work/chores Not difficult at all      Psychosocial Evaluation and Intervention:   Psychosocial Re-Evaluation:     Psychosocial Re-Evaluation    [place] Name 08/12/15 1447 11/11/15 1357           Psychosocial Re-Evaluation   Interventions Encouraged to attend Cardiac Rehabilitation for the exercise Encouraged to attend Cardiac Rehabilitation for the exercise      Comments Monique Burns reports she has had to go to multiple doctors appts lately and she is sorry that she hasn't been able to exercise in Cardiac Rehab lately due to her right hand hematoma.  Monique Burns called and said she is sorry that she can't return to Cardiac Rehab since her liver enzymes are elevated and her MD told her not to return until her follow up appt with him. Monique Burns said she is tired of being sick.         Vocational Rehabilitation: Provide vocational rehab assistance to qualifying candidates.   Vocational Rehab Evaluation & Intervention:   Education: Education Goals: Education classes will be provided on a weekly basis, covering required topics. Participant will state understanding/return demonstration of topics presented.  Learning Barriers/Preferences:   Education Topics: General Nutrition  Guidelines/Fats and Fiber: -Group instruction provided by verbal, written material, models and posters to present the general guidelines for heart healthy nutrition. Gives an explanation and review of dietary fats and fiber.   Controlling Sodium/Reading Food Labels: -Group verbal and written material supporting the discussion of sodium use in heart healthy nutrition. Review and explanation with models, verbal and written materials for utilization of the food label.   Exercise Physiology & Risk Factors: - Group verbal and written instruction with models to review the exercise physiology of the cardiovascular system and associated critical values. Details cardiovascular disease risk factors and the goals associated with each risk factor. Flowsheet Row Cardiac Rehab from 07/08/2015 in [facility] Cardiac and Pulmonary Rehab  Date  07/06/15  Educator  BS  Instruction Review Code  2- meets goals/outcomes      Aerobic Exercise & Resistance Training: - Gives group verbal and written discussion on the health impact of inactivity. On the components of aerobic and resistive training programs and the benefits of this training and how to safely progress through these programs. Flowsheet Row Cardiac Rehab from 07/08/2015 in [facility] Cardiac and Pulmonary Rehab  Date  07/08/15  Educator  BS  Instruction Review Code  2- meets goals/outcomes      Flexibility, Balance, General Exercise Guidelines: - Provides group verbal and written instruction on the benefits of flexibility and balance training programs. Provides general exercise guidelines with specific guidelines to those with heart or lung disease. Demonstration and skill practice provided.   Stress Management: - Provides group verbal and written instruction about the health risks of elevated stress, cause of high stress, and healthy ways to reduce stress.   Depression: - Provides group verbal and written instruction on the correlation between heart/lung  disease and depressed mood, treatment options, and the stigmas associated with seeking treatment. Flowsheet Row Cardiac Rehab from 07/08/2015 in [facility] Cardiac and Pulmonary Rehab  Date  07/01/15  Educator  CE  Instruction Review Code  2- meets goals/outcomes  Anatomy & Physiology of the Heart: - Group verbal and written instruction and models provide basic cardiac anatomy and physiology, with the coronary electrical and arterial systems. Review of: AMI, Angina, Valve disease, Heart Failure, Cardiac Arrhythmia, Pacemakers, and the ICD.   Cardiac Procedures: - Group verbal and written instruction and models to describe the testing methods done to diagnose heart disease. Reviews the outcomes of the test results. Describes the treatment choices: Medical Management, Angioplasty, or Coronary Bypass Surgery. Flowsheet Row Cardiac Rehab from 07/08/2015 in [facility] Cardiac and Pulmonary Rehab  Date  04/27/15  Educator  DW  Instruction Review Code  2- meets goals/outcomes      Cardiac Medications: - Group verbal and written instruction to review commonly prescribed medications for heart disease. Reviews the medication, class of the drug, and side effects. Includes the steps to properly store meds and maintain the prescription regimen.   Go Sex-Intimacy & Heart Disease, Get SMART - Goal Setting: - Group verbal and written instruction through game format to discuss heart disease and the return to sexual intimacy. Provides group verbal and written material to discuss and apply goal setting through the application of the S.M.A.R.T. Method. Flowsheet Row Cardiac Rehab from 07/08/2015 in [facility] Cardiac and Pulmonary Rehab  Date  04/27/15  Educator  DW  Instruction Review Code  2- meets goals/outcomes      Other Matters of the Heart: - Provides group verbal, written materials and models to describe Heart Failure, Angina, Valve Disease, and Diabetes in the realm of heart disease. Includes description of the  disease process and treatment options available to the cardiac patient.   Exercise & Equipment Safety: - Individual verbal instruction and demonstration of equipment use and safety with use of the equipment. Flowsheet Row Cardiac Rehab from 07/08/2015 in [facility] Cardiac and Pulmonary Rehab  Date  04/05/15  Educator  SB  Instruction Review Code  2- meets goals/outcomes      Infection Prevention: - Provides verbal and written material to individual with discussion of infection control including proper hand washing and proper equipment cleaning during exercise session. Flowsheet Row Cardiac Rehab from 07/08/2015 in [facility] Cardiac and Pulmonary Rehab  Date  04/05/15  Educator  SB  Instruction Review Code  2- meets goals/outcomes      Falls Prevention: - Provides verbal and written material to individual with discussion of falls prevention and safety. Flowsheet Row Cardiac Rehab from 07/08/2015 in [facility] Cardiac and Pulmonary Rehab  Date  04/05/15  Educator  SB  Instruction Review Code  2- meets goals/outcomes      Diabetes: - Individual verbal and written instruction to review signs/symptoms of diabetes, desired ranges of glucose level fasting, after meals and with exercise. Advice that pre and post exercise glucose checks will be done for 3 sessions at entry of program.    Knowledge Questionnaire Score:   Core Components/Risk Factors/Patient Goals at Admission:   Core Components/Risk Factors/Patient Goals Review:      Goals and Risk Factor Review    Row Name 08/12/15 1447             Core Components/Risk Factors/Patient Goals Review   Personal Goals Review Sedentary       Review Kazmira Harmening stopped by to show me her right hand hematoma. She is unable to attend Cardiac Rehab recently due to this. Her leg Baker's Cyst is better but now her problem is her right hand. Quinnlyn reports she has seen Dr. Caryl Comes twice about this and Dr. Vinetta Bergamo  once. Simrit said she is suppose to see DR.  Callwood on Tuesday and that she is still suppose to take her [facility] for her heart          Core Components/Risk Factors/Patient Goals at Discharge (Final Review):      Goals and Risk Factor Review - 08/12/15 1447      Core Components/Risk Factors/Patient Goals Review   Personal Goals Review Sedentary   Review Monique Burns stopped by to show me her right hand hematoma. She is unable to attend Cardiac Rehab recently due to this. Her leg Baker's Cyst is better but now her problem is her right hand. Monique Burns reports she has seen Dr. Caryl Comes twice about this and Dr. Vinetta Bergamo once. Monique Burns said she is suppose to see DR. Callwood on Tuesday and that she is still suppose to take her [place] for her heart      ITP Comments:     ITP Comments    Row Name 07/15/15 1514 07/29/15 1443 08/12/15 1445 08/29/15 1006 09/26/15 0934   ITP Comments Monique Burns came to class with swelling at her right ankle.  She had recently been for swelling and tightness in the calf of the same leg. She was sent straight to her PMD for assessment.  Monique Burns called today to share that her PMD is not sure what is causing the swelling.  Does not think is a bloodclot.  Might be a cyst that has ruptured and needs to be absorbed.  She is staying out of class until  her leg is better and she has the OK from her PMD to return to exercise.  30 Day Review. Continue with the ITP.  Has been out most of this month with problems with her leg. Monique Burns stopped by to show me her right hand hematoma. She is unable to attend Cardiac Rehab recently due to this. Her leg Baker's Cyst is better but now her problem is her right hand. Monique Burns reports she has seen Dr. Caryl Comes twice about this and Dr. Vinetta Bergamo once. Monique Burns said she is suppose to see DR. Callwood on Tuesday and that she is still suppose to take her [place] for her heart.  30 Day review. Continue with ITP  Last visit 07/08/2015 30 day review. Continue with the ITP. Remains out with medical concerns   [place] Name 10/20/15  1523 10/27/15 0808 10/28/15 1745 11/11/15 1356 11/16/15 1426   ITP Comments Pt has been out since last medical review. 30 day review.  Continue with ITP  Remains out, has been called to check on return to program [employer] called back and said she has had hand therapy after her hand surgery. Makya said she has a doctors appt with Dr. Caryl Comes next week and she bring a return to Cardiac Rehab note.  [place] called and said she is sorry that she can't return to Cardiac Rehab since her liver enzymes are elevated and her MD told her not to return until her follow up appt with him. No attendance since last review.   Row Name 11/24/15 0854 12/02/15 1442 12/16/15 1437 12/22/15 0718 12/28/15 1316   ITP Comments 30 day review. Continue with ITP. reamins absent since 3/2 for medical concerns Pt has been out since last review.  Just discharged from hospital for chest pain.  To follow with cardiology tomorrow. Pt going to [facility] for cath and consideration for CABG. 30 day review. Continue with ITP unless changes noted by Medical Director at signature of review. Pt admitted for CABG,  we will discharge at this time and look forward to working with her again soon!      Comments: Discharge ITP

## 2015-12-28 NOTE — Progress Notes (Signed)
Discharge Summary  Patient Details  Name: Monique Burns MRN: [id number] Date of Birth: 1931/05/08 Referring Provider:     Number of Visits: 11  Reason for Discharge:  Early Exit:  Readmission for CABG  Smoking History:  History  Smoking Status  . Never Smoker  Smokeless Tobacco  . Not on file    Diagnosis:  ST elevation (STEMI) myocardial infarction involving left anterior descending coronary artery (HCC)  S/P PTCA (percutaneous transluminal coronary angioplasty)  ADL [facility]SD:   Initial Exercise Prescription:   Discharge Exercise Prescription (Final Exercise Prescription Changes):     Exercise Prescription Changes - 07/08/15 0952      Exercise Review   Progression No  Absent since 05/04/15     Response to Exercise   Comments Patient absent since 05/03/16, workloads will be re-evaluated upon return based on fitness and function   Duration Progress to 30 minutes of continuous aerobic without signs/symptoms of physical distress   Intensity THRR unchanged     Progression   Progression Continue progressive overload as per policy without signs/symptoms or physical distress.     Interval Training   Interval Training No      Functional Capacity:   Psychological, QOL, Others - Outcomes: PHQ 2/9: Depression screen PHQ 2/9 04/05/2015  Decreased Interest 1  Down, Depressed, Hopeless 0  PHQ - 2 Score 1  Altered sleeping 1  Tired, decreased energy 2  Change in appetite 2  Feeling bad or failure about yourself  0  Trouble concentrating 0  Moving slowly or fidgety/restless 0  Suicidal thoughts 0  PHQ-9 Score 6  Difficult doing work/chores Not difficult at all    Quality of Life:   Personal Goals: Goals established at orientation with interventions provided to work toward goal.    Personal Goals Discharge:     Goals and Risk Factor Review    Row Name 08/12/15 1447             Core Components/Risk Factors/Patient Goals Review   Personal Goals  Review Sedentary       Review Monique Burns stopped by to show me her right hand hematoma. She is unable to attend Cardiac Rehab recently due to this. Her leg Baker's Cyst is better but now her problem is her right hand. Monique Burns reports she has seen Dr. Caryl Comes twice about this and Dr. Vinetta Bergamo once. Monique Burns said she is suppose to see DR. Callwood on Tuesday and that she is still suppose to take her [place] for her heart          Nutrition & Weight - Outcomes:    Nutrition:   Nutrition Discharge:   Education Questionnaire Score:   Goals reviewed with patient; copy given to patient.

## 2016-01-24 DIAGNOSIS — I4891 Unspecified atrial fibrillation: Secondary | ICD-10-CM | POA: Insufficient documentation

## 2016-05-02 DIAGNOSIS — I208 Other forms of angina pectoris: Secondary | ICD-10-CM | POA: Insufficient documentation

## 2016-07-03 ENCOUNTER — Emergency Department: Payer: Medicare Other

## 2016-07-03 ENCOUNTER — Emergency Department
Admission: EM | Admit: 2016-07-03 | Discharge: 2016-07-03 | Disposition: A | Discharge status: Home / Self Care | Payer: Medicare Other | Attending: Emergency Medicine | Admitting: Emergency Medicine

## 2016-07-03 DIAGNOSIS — I252 Old myocardial infarction: Secondary | ICD-10-CM | POA: Diagnosis not present

## 2016-07-03 DIAGNOSIS — R079 Chest pain, unspecified: Secondary | ICD-10-CM | POA: Diagnosis not present

## 2016-07-03 DIAGNOSIS — I2510 Atherosclerotic heart disease of native coronary artery without angina pectoris: Secondary | ICD-10-CM | POA: Diagnosis not present

## 2016-07-03 DIAGNOSIS — N183 Chronic kidney disease, stage 3 (moderate): Secondary | ICD-10-CM | POA: Insufficient documentation

## 2016-07-03 DIAGNOSIS — I509 Heart failure, unspecified: Secondary | ICD-10-CM | POA: Insufficient documentation

## 2016-07-03 DIAGNOSIS — I130 Hypertensive heart and chronic kidney disease with heart failure and stage 1 through stage 4 chronic kidney disease, or unspecified chronic kidney disease: Secondary | ICD-10-CM | POA: Insufficient documentation

## 2016-07-03 DIAGNOSIS — Z79899 Other long term (current) drug therapy: Secondary | ICD-10-CM | POA: Insufficient documentation

## 2016-07-03 LAB — BASIC METABOLIC PANEL
Anion gap: 8 (ref 5–15)
BUN: 33 mg/dL — ABNORMAL HIGH (ref 6–20)
CALCIUM: 9.2 mg/dL (ref 8.9–10.3)
CO2: 27 mmol/L (ref 22–32)
CREATININE: 1.15 mg/dL — AB (ref 0.44–1.00)
Chloride: 101 mmol/L (ref 101–111)
GFR calc Af Amer: 49 mL/min — ABNORMAL LOW (ref >60)
GFR calc non Af Amer: 42 mL/min — ABNORMAL LOW (ref >60)
Glucose, Bld: 79 mg/dL (ref 65–99)
Potassium: 4.2 mmol/L (ref 3.5–5.1)
Sodium: 136 mmol/L (ref 135–145)

## 2016-07-03 LAB — CBC
HEMATOCRIT: 33.6 % — AB (ref 35.0–47.0)
HEMOGLOBIN: 11.6 g/dL — AB (ref 12.0–16.0)
MCH: 32.2 pg (ref 26.0–34.0)
MCHC: 34.6 g/dL (ref 32.0–36.0)
MCV: 93 fL (ref 80.0–100.0)
Platelets: 259 10*3/uL (ref 150–440)
RBC: 3.61 MIL/uL — ABNORMAL LOW (ref 3.80–5.20)
RDW: 13.1 % (ref 11.5–14.5)
WBC: 6.8 10*3/uL (ref 3.6–11.0)

## 2016-07-03 LAB — TROPONIN I: Troponin I: <0.03 ng/mL (ref <0.03)

## 2016-07-03 NOTE — ED Triage Notes (Signed)
Pt c/o left sided chest pain that radiates into the left arm intermittent for the past 4 days, states only having pain in the left arm at present.. Pt has a hx of multiple cardiac surgeries.Marland Kitchen

## 2016-07-03 NOTE — ED Provider Notes (Signed)
[facility] Emergency Department Provider Note  ____________________________________________   First MD Initiated Contact with Patient 07/03/16 1226     (approximate)  I have reviewed the triage vital signs and the nursing notes.   HISTORY  Chief Complaint Chest Pain    HPI Monique Burns is a 83 y.o. female with a history that including ACS/CAD s/p CABG at [facility] about 6 months ago (current cardiologist is Dr. Clayborn Bigness) who presents by private vehicle for evaluation of chest pain over the last 4 days.  She reports that since her surgery 6 months ago she tends to develop left-sided chest pain and some arm pain particularly with exertion.  She thinks she may have overexerted herself for 5 days ago and has been having intermittent pain ever since.  She states that exertion does make it worse and rest makes it better.  The pain is in the left upper lateral part of her chest and radiates down her arm to about her elbow which is normal for her.  She has had no weakness or numbness in her hand or her arm.  She denies difficulty breathing, fever/chills, abdominal pain, nausea, vomiting, diarrhea.  She describes the symptoms as mild to moderate and currently she is not having any chest pain.  The symptoms were occurring mostly over the weekend so she did not have the opportunity to call her cardiologist.  She reports that she has been taking her medications. She states that she just wanted to make sure everything was okay and she wants to go home if possible.   Past Medical History:  Diagnosis Date  . Anemia   . Arthritis   . Blood dyscrasia    since being put on Brilinta after stent placement developing hematoma  . Cancer ([place])    basal cell left leg  . CHF (congestive heart failure) ([place])   . GERD (gastroesophageal reflux disease)   . Headache    h/o migraines  . Hyperlipidemia   . Hypertension   . Leukopenia   . MI (myocardial infarction) oct 2016  . Renal  disorder    stage 3-Dr Caryl Comes keeping a check on kidney function  . Shortness of breath dyspnea    mild compared to prior stenting-pt states she would have to stop and rest if walking a mile  . Thyroid disease     Patient Active Problem List   Diagnosis Date Noted  . Protein-calorie malnutrition, severe 11/27/2015  . Chest pain 11/26/2015  . Chronic kidney disease 04/05/2015  . HLD (hyperlipidemia) 04/05/2015  . BP (high blood pressure) 04/05/2015  . Decreased leukocytes 04/05/2015  . Arthritis, degenerative 04/05/2015  . Disease of thyroid gland 04/05/2015  . Acute myocardial infarction of anterior wall ([place]) 02/28/2015  . Degenerative arthritis of hip 10/31/2013    Past Surgical History:  Procedure Laterality Date  . ABDOMINAL HYSTERECTOMY    . CARDIAC CATHETERIZATION N/A 12/08/2015   Procedure: Left Heart Cath and Coronary Angiography;  Surgeon: Yolonda Kida, MD;  Location: [facility];  Service: Cardiovascular;  Laterality: N/A;  . CORONARY ANGIOPLASTY WITH STENT PLACEMENT    . CORONARY STENT PLACEMENT  2016   x2  . EYE SURGERY Bilateral 2011  . INCISION AND DRAINAGE Left 08/26/2015   Procedure: INCISION AND DRAINAGE / HAND HEMATOMA;  Surgeon: Hessie Knows, MD;  Location: [facility] OR[facility];  Service: Orthopedics;  Laterality: Left;  . JOINT REPLACEMENT Bilateral    left 2016 and right 2015  . TONSILLECTOMY  3rd grade    Prior to Admission medications   Medication Sig Start Date End Date Taking? Authorizing Provider  acetaminophen (TYLENOL) 500 MG tablet Take 500 mg by mouth every 6 (six) hours as needed for pain.   Yes Historical Provider, MD  aspirin EC 81 MG tablet Take 81 mg by mouth daily.   Yes Historical Provider, MD  atorvastatin (LIPITOR) 80 MG tablet Take 80 mg by mouth daily.   Yes Historical Provider, MD  cycloSPORINE (RESTASIS) 0.05 % ophthalmic emulsion Place 1 drop into both eyes 2 (two) times daily.  11/17/13  Yes Historical Provider, MD  DULoxetine  (CYMBALTA) 30 MG capsule Take 30 mg by mouth every morning.  01/13/15  Yes Historical Provider, MD  furosemide (LASIX) 20 MG tablet Take 20 mg by mouth daily as needed. 01/25/16 01/24/17 Yes Historical Provider, MD  metoprolol tartrate (LOPRESSOR) 25 MG tablet Take 12.5 mg by mouth every 12 (twelve) hours. 06/28/16  Yes Historical Provider, MD  nitroGLYCERIN (NITROSTAT) 0.4 MG SL tablet Place 0.4 mg under the tongue every 5 (five) minutes x 3 doses as needed. 05/02/16  Yes Historical Provider, MD  Pediatric Multivit-Minerals-C (KIDS VITAMINS COMPLETE PO) Take 1 tablet by mouth daily.   Yes Historical Provider, MD  potassium chloride (K-DUR) 10 MEQ tablet Take 10 mEq by mouth daily as needed. 01/25/16 01/24/17 Yes Historical Provider, MD    Allergies Brilinta [ticagrelor] and Imdur [isosorbide nitrate]  Family History  Problem Relation Age of Onset  . Breast cancer Paternal Grandmother     Social History Social History  Substance Use Topics  . Smoking status: Never Smoker  . Smokeless tobacco: Not on file  . Alcohol use No    Review of Systems Constitutional: No fever/chills Eyes: No visual changes. ENT: No sore throat. Cardiovascular: Left sided chest pain radiating to left upper arm Respiratory: Denies shortness of breath. Gastrointestinal: No abdominal pain.  No nausea, no vomiting.  No diarrhea.  No constipation. Genitourinary: Negative for dysuria. Musculoskeletal: Negative for back pain. Skin: Negative for rash. Neurological: Negative for headaches, focal weakness or numbness.  10-point ROS otherwise negative.  ____________________________________________   PHYSICAL EXAM:  VITAL SIGNS: ED Triage Vitals  Enc Vitals Group     BP 07/03/16 1135 (!) 148/58     Pulse Rate 07/03/16 1135 63     Resp 07/03/16 1135 16     Temp 07/03/16 1135 97.6 F (36.4 C)     Temp Source 07/03/16 1135 Oral     SpO2 07/03/16 1135 99 %     Weight 07/03/16 1133 112 lb (50.8 kg)     Height  07/03/16 1133 5\' 7"  (1.702 m)     Head Circumference --      Peak Flow --      Pain Score 07/03/16 1133 6     Pain Loc --      Pain Edu? --      Excl. in [place]? --     Constitutional: Alert and oriented. Well appearing and in no acute distress. Eyes: Conjunctivae are normal. PERRL. EOMI. Head: Atraumatic. Nose: No congestion/rhinnorhea. Mouth/Throat: Mucous membranes are moist. Neck: No stridor.  No meningeal signs.   Cardiovascular: Normal rate, regular rhythm. Good peripheral circulation. Grossly normal heart sounds.  Well-healed sternotomy scar Respiratory: Normal respiratory effort.  No retractions. Lungs CTAB. Gastrointestinal: Soft and nontender. No distention.  Musculoskeletal: No lower extremity tenderness nor edema. No gross deformities of extremities. Neurologic:  Normal speech and language. No gross focal  neurologic deficits are appreciated.  Skin:  Skin is warm, dry and intact. No rash noted. Psychiatric: Mood and affect are normal. Speech and behavior are normal.  ____________________________________________   LABS (all labs ordered are listed, but only abnormal results are displayed)  Labs Reviewed  BASIC METABOLIC PANEL - Abnormal; Notable for the following:       Result Value   BUN 33 (*)    Creatinine, Ser 1.15 (*)    GFR calc non Af Amer 42 (*)    GFR calc Af Amer 49 (*)    All other components within normal limits  CBC - Abnormal; Notable for the following:    RBC 3.61 (*)    Hemoglobin 11.6 (*)    HCT 33.6 (*)    All other components within normal limits  TROPONIN I  TROPONIN I   ____________________________________________  EKG  ED ECG REPORT I, Valrie Jia, the attending physician, personally viewed and interpreted this ECG.  Date: 07/03/2016 EKG Time: 11:36 Rate: 65 Rhythm: normal sinus rhythm QRS Axis: normal Intervals: normal ST/T Wave abnormalities: normal Conduction Disturbances: none Narrative Interpretation:  unremarkable  ____________________________________________  RADIOLOGY   Dg Chest 2 View  Result Date: 07/03/2016 CLINICAL DATA:  83 year old female with left-sided chest pain extending down left arm starting 24 hours ago. Initial encounter. EXAM: CHEST  2 VIEW COMPARISON:  11/26/2015 and 04/27/2015. FINDINGS: Post CABG with prominent coronary artery calcification. Heart size within normal limits. Calcified aorta. Chronic lung changes without segmental consolidation, pulmonary edema or pneumothorax. No plain film evidence of pulmonary malignancy. Mild thoracic kyphosis without focal compression fracture. IMPRESSION: Chronic lung changes without acute pulmonary abnormality. Post CABG with coronary artery calcifications. Aortic atherosclerosis. Electronically Signed   By: Genia Del M.D.   On: 07/03/2016 12:18    ____________________________________________   PROCEDURES  Procedure(s) performed:   Procedures   Critical Care performed: No ____________________________________________   INITIAL IMPRESSION / ASSESSMENT AND PLAN / ED COURSE  Pertinent labs & imaging results that were available during my care of the patient were reviewed by me and considered in my medical decision making (see chart for details).  Patient has significant cardiac history, but symptoms are relatively reassuring at this time and have been present for 4 days.  Initial troponin is negative.  No EKG changes consistent with ischemia.  Will touch base with Dr. Clayborn Bigness and anticipate discharge with outpatient follow up.     Clinical Course as of Jul 03 1605  Mon Jul 03, 2016  1518 Second troponin is in process.  I discussed the case by phone with Dr. Clayborn Bigness who knows the patient well and agrees with the plan for outpatient follow-up and does not feel she needs to be admitted.  He told me to let her know that he can see her in clinic tomorrow.  I updated the patient with plan and she is very happy with this as  she does not want to stay in the hospital.  She is currently not having any pain.  [CF]    Clinical Course User Index [CF] Hinda Kehr, MD    ____________________________________________  FINAL CLINICAL IMPRESSION(S) / ED DIAGNOSES  Final diagnoses:  Chest pain, unspecified type     MEDICATIONS GIVEN DURING THIS VISIT:  Medications - No data to display   NEW OUTPATIENT MEDICATIONS STARTED DURING THIS VISIT:  New Prescriptions   No medications on file    Modified Medications   No medications on file    Discontinued Medications  CALCIUM-VITAMIN D (OSCAL WITH D) 500-200 MG-UNIT TABLET    Take 1 tablet by mouth daily.   CHOLECALCIFEROL (VITAMIN D3) 1000 UNITS CAPS    Take 1 tablet by mouth daily.   ISOSORBIDE MONONITRATE (IMDUR) 60 MG 24 HR TABLET    Take 30 mg by mouth at bedtime. Taking half a tablet per md instructions   ISOSORBIDE MONONITRATE (IMDUR) 60 MG 24 HR TABLET    Take 30 mg by mouth at bedtime.   METOPROLOL SUCCINATE (TOPROL-XL) 25 MG 24 HR TABLET    Take 12.5 mg by mouth at bedtime.    PROAIR HFA 108 (90 BASE) MCG/ACT INHALER    Inhale 2 puffs into the lungs every 6 (six) hours as needed for wheezing.   RANITIDINE (ZANTAC) 150 MG CAPSULE    Take 1 capsule by mouth 2 (two) times daily.   TICAGRELOR (BRILINTA) 90 MG TABS TABLET    Take 90 mg by mouth 2 (two) times daily.     Note:  This document was prepared using Dragon voice recognition software and may include unintentional dictation errors.    Hinda Kehr, MD 07/03/16 [phone]

## 2016-07-03 NOTE — Discharge Instructions (Signed)

## 2017-01-05 ENCOUNTER — Other Ambulatory Visit: Payer: Self-pay | Admitting: Neurology

## 2017-01-05 DIAGNOSIS — R4189 Other symptoms and signs involving cognitive functions and awareness: Secondary | ICD-10-CM

## 2017-01-05 DIAGNOSIS — R4789 Other speech disturbances: Secondary | ICD-10-CM

## 2017-01-17 ENCOUNTER — Ambulatory Visit
Admission: RE | Admit: 2017-01-17 | Discharge: 2017-01-17 | Disposition: A | Discharge status: Home / Self Care | Payer: Medicare Other | Source: Ambulatory Visit | Attending: Neurology | Admitting: Neurology

## 2017-01-17 DIAGNOSIS — R4189 Other symptoms and signs involving cognitive functions and awareness: Secondary | ICD-10-CM

## 2017-01-17 DIAGNOSIS — I619 Nontraumatic intracerebral hemorrhage, unspecified: Secondary | ICD-10-CM | POA: Diagnosis not present

## 2017-01-17 DIAGNOSIS — I6782 Cerebral ischemia: Secondary | ICD-10-CM | POA: Diagnosis not present

## 2017-01-17 DIAGNOSIS — R4789 Other speech disturbances: Secondary | ICD-10-CM

## 2017-04-17 DIAGNOSIS — E854 Organ-limited amyloidosis: Secondary | ICD-10-CM | POA: Insufficient documentation

## 2017-05-14 DIAGNOSIS — D692 Other nonthrombocytopenic purpura: Secondary | ICD-10-CM | POA: Insufficient documentation

## 2017-08-14 ENCOUNTER — Other Ambulatory Visit: Payer: Self-pay | Admitting: Internal Medicine

## 2017-08-14 ENCOUNTER — Other Ambulatory Visit
Admission: RE | Admit: 2017-08-14 | Discharge: 2017-08-14 | Disposition: A | Discharge status: Home / Self Care | Payer: Medicare Other | Source: Ambulatory Visit | Attending: Internal Medicine | Admitting: Internal Medicine

## 2017-08-14 DIAGNOSIS — I680 Cerebral amyloid angiopathy: Principal | ICD-10-CM

## 2017-08-14 DIAGNOSIS — R531 Weakness: Secondary | ICD-10-CM

## 2017-08-14 DIAGNOSIS — I10 Essential (primary) hypertension: Secondary | ICD-10-CM | POA: Insufficient documentation

## 2017-08-14 DIAGNOSIS — E854 Organ-limited amyloidosis: Secondary | ICD-10-CM

## 2017-08-14 DIAGNOSIS — H539 Unspecified visual disturbance: Secondary | ICD-10-CM

## 2017-08-14 LAB — TROPONIN I

## 2017-08-15 ENCOUNTER — Other Ambulatory Visit: Payer: Self-pay | Admitting: Internal Medicine

## 2017-08-15 DIAGNOSIS — I680 Cerebral amyloid angiopathy: Principal | ICD-10-CM

## 2017-08-15 DIAGNOSIS — H539 Unspecified visual disturbance: Secondary | ICD-10-CM

## 2017-08-15 DIAGNOSIS — E854 Organ-limited amyloidosis: Secondary | ICD-10-CM

## 2017-08-15 DIAGNOSIS — R531 Weakness: Secondary | ICD-10-CM

## 2017-08-21 ENCOUNTER — Ambulatory Visit
Admission: RE | Admit: 2017-08-21 | Discharge: 2017-08-21 | Disposition: A | Discharge status: Home / Self Care | Payer: Medicare Other | Source: Ambulatory Visit | Attending: Internal Medicine | Admitting: Internal Medicine

## 2017-08-21 DIAGNOSIS — E854 Organ-limited amyloidosis: Secondary | ICD-10-CM | POA: Insufficient documentation

## 2017-08-21 DIAGNOSIS — R531 Weakness: Secondary | ICD-10-CM | POA: Insufficient documentation

## 2017-08-21 DIAGNOSIS — H539 Unspecified visual disturbance: Secondary | ICD-10-CM | POA: Diagnosis not present

## 2017-08-21 DIAGNOSIS — I680 Cerebral amyloid angiopathy: Secondary | ICD-10-CM | POA: Insufficient documentation

## 2017-12-20 ENCOUNTER — Other Ambulatory Visit: Payer: Self-pay

## 2017-12-20 ENCOUNTER — Observation Stay
Admit: 2017-12-20 | Discharge: 2017-12-20 | Disposition: A | Discharge status: Home / Self Care | Payer: Medicare Other | Attending: Internal Medicine | Admitting: Internal Medicine

## 2017-12-20 ENCOUNTER — Observation Stay
Admission: EM | Admit: 2017-12-20 | Discharge: 2017-12-21 | Disposition: A | Discharge status: Home / Self Care | Payer: Medicare Other | Attending: Internal Medicine | Admitting: Internal Medicine

## 2017-12-20 ENCOUNTER — Emergency Department: Payer: Medicare Other

## 2017-12-20 ENCOUNTER — Encounter: Payer: Self-pay | Admitting: Emergency Medicine

## 2017-12-20 DIAGNOSIS — Z955 Presence of coronary angioplasty implant and graft: Secondary | ICD-10-CM | POA: Diagnosis not present

## 2017-12-20 DIAGNOSIS — Z7982 Long term (current) use of aspirin: Secondary | ICD-10-CM | POA: Insufficient documentation

## 2017-12-20 DIAGNOSIS — E785 Hyperlipidemia, unspecified: Secondary | ICD-10-CM | POA: Insufficient documentation

## 2017-12-20 DIAGNOSIS — Z951 Presence of aortocoronary bypass graft: Secondary | ICD-10-CM | POA: Diagnosis not present

## 2017-12-20 DIAGNOSIS — I5022 Chronic systolic (congestive) heart failure: Secondary | ICD-10-CM | POA: Insufficient documentation

## 2017-12-20 DIAGNOSIS — N183 Chronic kidney disease, stage 3 (moderate): Secondary | ICD-10-CM | POA: Insufficient documentation

## 2017-12-20 DIAGNOSIS — R079 Chest pain, unspecified: Secondary | ICD-10-CM

## 2017-12-20 DIAGNOSIS — I25110 Atherosclerotic heart disease of native coronary artery with unstable angina pectoris: Principal | ICD-10-CM | POA: Insufficient documentation

## 2017-12-20 DIAGNOSIS — I130 Hypertensive heart and chronic kidney disease with heart failure and stage 1 through stage 4 chronic kidney disease, or unspecified chronic kidney disease: Secondary | ICD-10-CM | POA: Insufficient documentation

## 2017-12-20 DIAGNOSIS — Z888 Allergy status to other drugs, medicaments and biological substances status: Secondary | ICD-10-CM | POA: Diagnosis not present

## 2017-12-20 DIAGNOSIS — M1990 Unspecified osteoarthritis, unspecified site: Secondary | ICD-10-CM | POA: Insufficient documentation

## 2017-12-20 DIAGNOSIS — K219 Gastro-esophageal reflux disease without esophagitis: Secondary | ICD-10-CM | POA: Insufficient documentation

## 2017-12-20 DIAGNOSIS — I200 Unstable angina: Secondary | ICD-10-CM | POA: Diagnosis present

## 2017-12-20 DIAGNOSIS — Z79899 Other long term (current) drug therapy: Secondary | ICD-10-CM | POA: Diagnosis not present

## 2017-12-20 DIAGNOSIS — I700 Atherosclerosis of aorta: Secondary | ICD-10-CM | POA: Insufficient documentation

## 2017-12-20 DIAGNOSIS — I252 Old myocardial infarction: Secondary | ICD-10-CM | POA: Diagnosis not present

## 2017-12-20 LAB — TROPONIN I
Troponin I: <0.03 ng/mL (ref <0.03)
Troponin I: <0.03 ng/mL (ref <0.03)

## 2017-12-20 LAB — CBC
HEMATOCRIT: 31.5 % — AB (ref 35.0–47.0)
HEMOGLOBIN: 10.8 g/dL — AB (ref 12.0–16.0)
MCH: 33.3 pg (ref 26.0–34.0)
MCHC: 34.2 g/dL (ref 32.0–36.0)
MCV: 97.6 fL (ref 80.0–100.0)
Platelets: 214 10*3/uL (ref 150–440)
RBC: 3.23 MIL/uL — ABNORMAL LOW (ref 3.80–5.20)
RDW: 13.3 % (ref 11.5–14.5)
WBC: 4.4 10*3/uL (ref 3.6–11.0)

## 2017-12-20 LAB — BASIC METABOLIC PANEL
ANION GAP: 7 (ref 5–15)
BUN: 28 mg/dL — AB (ref 8–23)
CHLORIDE: 108 mmol/L (ref 98–111)
CO2: 24 mmol/L (ref 22–32)
Calcium: 8.8 mg/dL — ABNORMAL LOW (ref 8.9–10.3)
Creatinine, Ser: 1.39 mg/dL — ABNORMAL HIGH (ref 0.44–1.00)
GFR calc Af Amer: 39 mL/min — ABNORMAL LOW (ref >60)
GFR, EST NON AFRICAN AMERICAN: 34 mL/min — AB (ref >60)
Glucose, Bld: 97 mg/dL (ref 70–99)
POTASSIUM: 4.5 mmol/L (ref 3.5–5.1)
Sodium: 139 mmol/L (ref 135–145)

## 2017-12-20 LAB — ECHOCARDIOGRAM COMPLETE
Height: 67 in
Weight: 1790.4 oz

## 2017-12-20 MED ORDER — NITROGLYCERIN 2 % TD OINT
0.5000 [in_us] | TOPICAL_OINTMENT | Freq: Four times a day (QID) | TRANSDERMAL | Status: DC
  Administered 2017-12-20 – 2017-12-21 (×4): 0.5 [in_us] via TOPICAL
  Filled 2017-12-20 (×4): qty 1

## 2017-12-20 MED ORDER — ENOXAPARIN SODIUM 30 MG/0.3ML ~~LOC~~ SOLN
30.0000 mg | SUBCUTANEOUS | Status: DC
  Administered 2017-12-20 – 2017-12-21 (×2): 30 mg via SUBCUTANEOUS
  Filled 2017-12-20 (×2): qty 0.3

## 2017-12-20 MED ORDER — ACETAMINOPHEN 325 MG PO TABS: 650.0000 mg | ORAL_TABLET | Freq: Four times a day (QID) | ORAL | Status: DC | PRN

## 2017-12-20 MED ORDER — ONDANSETRON HCL 4 MG PO TABS: 4.0000 mg | ORAL_TABLET | Freq: Four times a day (QID) | ORAL | Status: DC | PRN

## 2017-12-20 MED ORDER — DULOXETINE HCL 30 MG PO CPEP
30.0000 mg | ORAL_CAPSULE | Freq: Every day | ORAL | Status: DC
  Administered 2017-12-20: 30 mg via ORAL
  Filled 2017-12-20 (×3): qty 1

## 2017-12-20 MED ORDER — METOPROLOL TARTRATE 25 MG PO TABS
12.5000 mg | ORAL_TABLET | Freq: Two times a day (BID) | ORAL | Status: DC
Start: 2017-12-20 — End: 2017-12-21
  Administered 2017-12-20 – 2017-12-21 (×3): 12.5 mg via ORAL
  Filled 2017-12-20 (×3): qty 1

## 2017-12-20 MED ORDER — SODIUM CHLORIDE 0.9% FLUSH
3.0000 mL | Freq: Two times a day (BID) | INTRAVENOUS | Status: DC
  Administered 2017-12-20 – 2017-12-21 (×3): 3 mL via INTRAVENOUS

## 2017-12-20 MED ORDER — ACETAMINOPHEN 650 MG RE SUPP: 650.0000 mg | Freq: Four times a day (QID) | RECTAL | Status: DC | PRN

## 2017-12-20 MED ORDER — ENSURE ENLIVE PO LIQD
237.0000 mL | Freq: Two times a day (BID) | ORAL | Status: DC
  Administered 2017-12-20 – 2017-12-21 (×2): 237 mL via ORAL

## 2017-12-20 MED ORDER — RANOLAZINE ER 500 MG PO TB12
1000.0000 mg | ORAL_TABLET | Freq: Two times a day (BID) | ORAL | Status: DC
  Administered 2017-12-20 – 2017-12-21 (×3): 1000 mg via ORAL
  Filled 2017-12-20 (×5): qty 2

## 2017-12-20 MED ORDER — CYCLOSPORINE 0.05 % OP EMUL
1.0000 [drp] | Freq: Two times a day (BID) | OPHTHALMIC | Status: DC
Start: 2017-12-20 — End: 2017-12-21
  Administered 2017-12-20 – 2017-12-21 (×3): 1 [drp] via OPHTHALMIC
  Filled 2017-12-20 (×5): qty 1

## 2017-12-20 MED ORDER — ASPIRIN EC 81 MG PO TBEC
81.0000 mg | DELAYED_RELEASE_TABLET | Freq: Every day | ORAL | Status: DC
  Administered 2017-12-21: 81 mg via ORAL
  Filled 2017-12-20: qty 1

## 2017-12-20 MED ORDER — DOCUSATE SODIUM 100 MG PO CAPS
100.0000 mg | ORAL_CAPSULE | Freq: Two times a day (BID) | ORAL | Status: DC
  Administered 2017-12-20: 100 mg via ORAL
  Filled 2017-12-20 (×3): qty 1

## 2017-12-20 MED ORDER — ONDANSETRON HCL 4 MG/2ML IJ SOLN
4.0000 mg | Freq: Four times a day (QID) | INTRAMUSCULAR | Status: DC | PRN
  Administered 2017-12-21: 4 mg via INTRAVENOUS
  Filled 2017-12-20: qty 2

## 2017-12-20 MED ORDER — TIZANIDINE HCL 2 MG PO TABS
2.0000 mg | ORAL_TABLET | Freq: Every evening | ORAL | Status: DC | PRN
  Administered 2017-12-20: 2 mg via ORAL
  Filled 2017-12-20 (×2): qty 1

## 2017-12-20 MED ORDER — POLYETHYLENE GLYCOL 3350 17 G PO PACK: 17.0000 g | PACK | Freq: Every day | ORAL | Status: DC | PRN

## 2017-12-20 MED ORDER — ATORVASTATIN CALCIUM 80 MG PO TABS
80.0000 mg | ORAL_TABLET | Freq: Every evening | ORAL | Status: DC
  Administered 2017-12-20: 80 mg via ORAL
  Filled 2017-12-20: qty 4
  Filled 2017-12-20 (×2): qty 1

## 2017-12-20 NOTE — Progress Notes (Signed)
*  PRELIMINARY RESULTS* Echocardiogram 2D Echocardiogram has been performed.  Monique Burns 12/20/2017, 12:25 PM

## 2017-12-20 NOTE — ED Notes (Signed)
Patient transported to room 232

## 2017-12-20 NOTE — ED Provider Notes (Signed)
[facility] Emergency Department Provider Note   First MD Initiated Contact with Patient 12/20/17 [phone]     (approximate)  I have reviewed the triage vital signs and the nursing notes.   HISTORY  Chief Complaint Chest Pain    HPI Monique Burns is a 84 y.o. female with below list of chronic medical conditions including coronary artery bypass graft x4 vessels presents to the emergency department with intermittent chest pain x2 days she states that starting last night pain became worse and as a result she took nitroglycerin which improved the pain however pain recurred and as such EMS was notified.  On EMS arrival nitro spray was administered as well as 3 and 24 mg of aspirin with pain improvement.  Patient states current pain score 7 out of 10 which is improved since onset.  Patient denies any dyspnea   Past Medical History:  Diagnosis Date  . Anemia   . Arthritis   . Blood dyscrasia    since being put on Brilinta after stent placement developing hematoma  . Cancer ([place])    basal cell left leg  . CHF (congestive heart failure) ([place])   . GERD (gastroesophageal reflux disease)   . Headache    h/o migraines  . Hyperlipidemia   . Hypertension   . Leukopenia   . MI (myocardial infarction) ([place]) 02/2015   s/p 4 stents and CABG in 2017  . Renal disorder    stage 3-Dr Caryl Comes keeping a check on kidney function  . Shortness of breath dyspnea    mild compared to prior stenting-pt states she would have to stop and rest if walking a mile  . Thyroid disease     Patient Active Problem List   Diagnosis Date Noted  . Unstable angina ([place]) 12/20/2017  . Protein-calorie malnutrition, severe 11/27/2015  . Chest pain 11/26/2015  . Chronic kidney disease 04/05/2015  . HLD (hyperlipidemia) 04/05/2015  . BP (high blood pressure) 04/05/2015  . Decreased leukocytes 04/05/2015  . Arthritis, degenerative 04/05/2015  . Disease of thyroid gland 04/05/2015  . Acute  myocardial infarction of anterior wall ([place]) 02/28/2015  . Degenerative arthritis of hip 10/31/2013    Past Surgical History:  Procedure Laterality Date  . ABDOMINAL HYSTERECTOMY    . CARDIAC CATHETERIZATION N/A 12/08/2015   Procedure: Left Heart Cath and Coronary Angiography;  Surgeon: Yolonda Kida, MD;  Location: [facility];  Service: Cardiovascular;  Laterality: N/A;  . CORONARY ANGIOPLASTY WITH STENT PLACEMENT    . CORONARY STENT PLACEMENT  2016   x2  . EYE SURGERY Bilateral 2011  . INCISION AND DRAINAGE Left 08/26/2015   Procedure: INCISION AND DRAINAGE / HAND HEMATOMA;  Surgeon: Hessie Knows, MD;  Location: [facility];  Service: Orthopedics;  Laterality: Left;  . JOINT REPLACEMENT Bilateral    left 2016 and right 2015  . TONSILLECTOMY     3rd grade    Prior to Admission medications   Medication Sig Start Date End Date Taking? Authorizing Provider  acetaminophen (TYLENOL) 500 MG tablet Take 500 mg by mouth every 6 (six) hours as needed for pain.   Yes [provider]  aspirin EC 81 MG tablet Take 81 mg by mouth daily.   Yes [provider]  atorvastatin (LIPITOR) 80 MG tablet Take 80 mg by mouth every evening.    Yes [provider]  cycloSPORINE (RESTASIS) 0.05 % ophthalmic emulsion Place 1 drop into both eyes 2 (two) times daily.  11/17/13  Yes [provider]  DULoxetine (CYMBALTA) 30 MG capsule Take 30 mg by mouth daily.    Yes [provider]  metoprolol tartrate (LOPRESSOR) 25 MG tablet Take 12.5 mg by mouth every 12 (twelve) hours. 06/28/16  Yes [provider]  nitroGLYCERIN (NITROSTAT) 0.4 MG SL tablet Place 0.4 mg under the tongue every 5 (five) minutes x 3 doses as needed. 05/02/16  Yes [provider]  ranolazine (RANEXA) 1000 MG SR tablet Take 1,000 mg by mouth 2 (two) times daily. 11/30/17  Yes [provider]  tiZANidine (ZANAFLEX) 2 MG tablet Take 2 mg by mouth at bedtime as needed. pain  12/12/17  Yes [provider]    Allergies Brilinta [ticagrelor] and Imdur [isosorbide nitrate]  Family History  Problem Relation Age of Onset  . Breast cancer Paternal Grandmother   . Brain cancer Mother   . Stroke Father   . Brain cancer Son     Social History Social History   Tobacco Use  . Smoking status: Never Smoker  . Smokeless tobacco: Never Used  Substance Use Topics  . Alcohol use: No  . Drug use: No    Review of Systems Constitutional: No fever/chills Eyes: No visual changes. ENT: No sore throat. Cardiovascular: Positive for chest pain. Respiratory: Denies shortness of breath. Gastrointestinal: No abdominal pain.  No nausea, no vomiting.  No diarrhea.  No constipation. Genitourinary: Negative for dysuria. Musculoskeletal: Negative for neck pain.  Negative for back pain. Integumentary: Negative for rash. Neurological: Negative for headaches, focal weakness or numbness.   ____________________________________________   PHYSICAL EXAM:  VITAL SIGNS: ED Triage Vitals  Enc Vitals Group     BP 12/20/17 0616 (!) 180/68     Pulse Rate 12/20/17 0616 61     Resp 12/20/17 0616 16     Temp 12/20/17 0616 98 F (36.7 C)     Temp Source 12/20/17 0616 Oral     SpO2 12/20/17 0616 97 %     Weight 12/20/17 0618 50.8 kg (112 lb)     Height 12/20/17 0618 1.702 m (5\' 7" )     Head Circumference --      Peak Flow --      Pain Score 12/20/17 0617 7     Pain Loc --      Pain Edu? --      Excl. in [place]? --     Constitutional: Alert and oriented. Well appearing and in no acute distress. Eyes: Conjunctivae are normal. PERRL. EOMI. Head: Atraumatic. Mouth/Throat: Mucous membranes are moist. Oropharynx non-erythematous. Neck: No stridor.   Cardiovascular: Normal rate, regular rhythm. Good peripheral circulation. Grossly normal heart sounds. Respiratory: Normal respiratory effort.  No retractions. Lungs CTAB. Gastrointestinal: Soft and nontender. No distention.    Musculoskeletal: No lower extremity tenderness nor edema. No gross deformities of extremities. Neurologic:  Normal speech and language. No gross focal neurologic deficits are appreciated.  Skin:  Skin is warm, dry and intact. No rash noted. Psychiatric: Mood and affect are normal. Speech and behavior are normal.  ____________________________________________   LABS (all labs ordered are listed, but only abnormal results are displayed)  Labs Reviewed  BASIC METABOLIC PANEL - Abnormal; Notable for the following components:      Result Value   BUN 28 (*)    Creatinine, Ser 1.39 (*)    Calcium 8.8 (*)    GFR calc non Af Amer 34 (*)    GFR calc Af Amer 39 (*)  All other components within normal limits  CBC - Abnormal; Notable for the following components:   RBC 3.23 (*)    Hemoglobin 10.8 (*)    HCT 31.5 (*)    All other components within normal limits  TROPONIN I   ____________________________________________  EKG  ED ECG REPORT I, [place] Ellanie Oppedisano, the attending physician, personally viewed and interpreted this ECG.   Date: 12/20/2017  EKG Time: 6:15 AM  Rate: 59  Rhythm: Normal sinus rhythm  Axis: Normal  Intervals: Normal  ST&T Change: None  ____________________________________________  RADIOLOGY I, [place] Jayra Choyce, personally viewed and evaluated these images (plain radiographs) as part of my medical decision making, as well as reviewing the written report by the radiologist.  ED MD interpretation: Emphysematous and chronic bronchitic changes noted on chest x-ray per radiologist.  Official radiology report(s): Dg Chest Port 1 View  Result Date: 12/20/2017 CLINICAL DATA:  Chest pain for 2 days. Previous cardiac history. EXAM: PORTABLE CHEST 1 VIEW COMPARISON:  07/03/2016 FINDINGS: Postoperative changes in the mediastinum. Normal heart size and pulmonary vascularity. Coronary stents are present. Emphysematous changes in the lungs with peribronchial thickening and  perihilar interstitial changes consistent with chronic bronchitis. No focal consolidation. No blunting of costophrenic angles. No pneumothorax. Calcification of the aorta. IMPRESSION: Emphysematous and chronic bronchitic changes in the lungs. No evidence of active pulmonary disease. Aortic atherosclerosis. Electronically Signed   By: Lucienne Capers M.D.   On: 12/20/2017 06:39      Procedures   ____________________________________________   INITIAL IMPRESSION / ASSESSMENT AND PLAN / ED COURSE  As part of my medical decision making, I reviewed the following data within the electronic MEDICAL RECORD NUMBER    84 year old female presenting with above-stated history and physical exam secondary to chest pain.  Considered possibility of ACS and as such EKG and appropriate laboratory data was obtained.  Troponin negative x1 EKG revealed no evidence of acute ischemia or infarction.  Patient noted to be hypertensive during my evaluation as such half inch of Nitropaste was applied to the patient's chest.  With improvement of blood pressure from 180/68-150 6/61 at present.  Patient discussed with Dr. Tressia Miners for hospital admission further evaluation and management. ____________________________________________  FINAL CLINICAL IMPRESSION(S) / ED DIAGNOSES  Final diagnoses:  Chest pain, unspecified type     MEDICATIONS GIVEN DURING THIS VISIT:  Medications - No data to display   ED Discharge Orders    None       Note:  This document was prepared using Dragon voice recognition software and may include unintentional dictation errors.    Gregor Hams, MD 12/20/17 [phone]

## 2017-12-20 NOTE — Progress Notes (Signed)
Anticoagulation monitoring(Lovenox):  84yo  female ordered Lovenox 40 mg Q24h  Filed Weights   12/20/17 0618 12/20/17 0926  Weight: 112 lb (50.8 kg) 111 lb 14.4 oz (50.8 kg)   BMI 17.6   Lab Results  Component Value Date   CREATININE 1.39 (H) 12/20/2017   CREATININE 1.15 (H) 07/03/2016   CREATININE 1.21 (H) 11/27/2015   Estimated Creatinine Clearance: 23.7 mL/min (A) (by C-G formula based on SCr of 1.39 mg/dL (H)). Hemoglobin & Hematocrit     Component Value Date/Time   HGB 10.8 (L) 12/20/2017 0626   HGB 9.2 (L) 03/25/2014 0407   HCT 31.5 (L) 12/20/2017 0626   HCT 41.3 03/16/2014 1156     Per Protocol for Patient with estCrcl < 30 ml/min and BMI < 40, will transition to Lovenox 30 mg Q24h.

## 2017-12-20 NOTE — ED Triage Notes (Signed)
Pt arrived to the ED via EMS from home for complaints of chest pain for the last 2 days. Pt reports that at dinner time last night the pain got worse and took some nitro and it helped. Pt is AOx4 in no apparent distress with mild chest pain radiating to the left arm; Pt has an extensive cardiac history with 4 stents and an MI. EMS administered nitro spray and 324 of aspirin.

## 2017-12-20 NOTE — H&P (Signed)
[facility] [place] NAME: Monique Burns    MR#:  [id number]  DATE OF BIRTH:  February 04, 1931  DATE OF ADMISSION:  12/20/2017  PRIMARY CARE PHYSICIAN: Adin Hector, MD   REQUESTING/REFERRING PHYSICIAN: Dr. Marjean Donna  CHIEF COMPLAINT:   Chief Complaint  Patient presents with  . Chest Pain    HISTORY OF PRESENT ILLNESS:  Monique Burns  is a 84 y.o. female with a known history of CAD status post stents and CABG in 6333, systolic CHF, anemia of chronic disease, GERD, arthritis, CKD presents to hospital secondary to worsening chest pain. Since her bypass surgery, patient had occasional stable angina.  She has been on Ranexa.  For the last couple of days she had a feeling of discomfort in the chest associated with nausea which would come and go.  Last night she was fine when she went to bed, woke up in the middle of the night with intense chest tightness radiating down her left arm and her jaw, associated did with significant nausea.  She presented to the emergency room, pain improved with nitroglycerin and aspirin.  First troponin is negative.  EKG without any acute changes.  She is being admitted for further observation.  PAST MEDICAL HISTORY:   Past Medical History:  Diagnosis Date  . Anemia   . Arthritis   . Blood dyscrasia    since being put on Brilinta after stent placement developing hematoma  . Cancer ([place])    basal cell left leg  . CHF (congestive heart failure) ([place])   . GERD (gastroesophageal reflux disease)   . Headache    h/o migraines  . Hyperlipidemia   . Hypertension   . Leukopenia   . MI (myocardial infarction) ([place]) 02/2015   s/p 4 stents and CABG in 2017  . Renal disorder    stage 3-Dr Caryl Comes keeping a check on kidney function  . Shortness of breath dyspnea    mild compared to prior stenting-pt states she would have to stop and rest if walking a mile  . Thyroid disease     PAST SURGICAL HISTORY:   Past  Surgical History:  Procedure Laterality Date  . ABDOMINAL HYSTERECTOMY    . CARDIAC CATHETERIZATION N/A 12/08/2015   Procedure: Left Heart Cath and Coronary Angiography;  Surgeon: Yolonda Kida, MD;  Location: [facility];  Service: Cardiovascular;  Laterality: N/A;  . CORONARY ANGIOPLASTY WITH STENT PLACEMENT    . CORONARY STENT PLACEMENT  2016   x2  . EYE SURGERY Bilateral 2011  . INCISION AND DRAINAGE Left 08/26/2015   Procedure: INCISION AND DRAINAGE / HAND HEMATOMA;  Surgeon: Hessie Knows, MD;  Location: [facility];  Service: Orthopedics;  Laterality: Left;  . JOINT REPLACEMENT Bilateral    left 2016 and right 2015  . TONSILLECTOMY     3rd grade    SOCIAL HISTORY:   Social History   Tobacco Use  . Smoking status: Never Smoker  . Smokeless tobacco: Never Used  Substance Use Topics  . Alcohol use: No    FAMILY HISTORY:   Family History  Problem Relation Age of Onset  . Breast cancer Paternal Grandmother   . Brain cancer Mother   . Stroke Father   . Brain cancer Son     DRUG ALLERGIES:   Allergies  Allergen Reactions  . Brilinta [Ticagrelor] Other (See Comments)    "caused stent to fail"  . Imdur [Isosorbide Nitrate] Other (  See Comments)    Headache    REVIEW OF SYSTEMS:   Review of Systems  Constitutional: Negative for chills, fever, malaise/fatigue and weight loss.  HENT: Negative for ear discharge, ear pain, hearing loss and nosebleeds.   Eyes: Negative for blurred vision, double vision and photophobia.  Respiratory: Negative for cough, hemoptysis, shortness of breath and wheezing.   Cardiovascular: Positive for chest pain. Negative for palpitations, orthopnea and leg swelling.  Gastrointestinal: Positive for nausea. Negative for abdominal pain, constipation, diarrhea, heartburn, melena and vomiting.  Genitourinary: Negative for dysuria, frequency, hematuria and urgency.  Musculoskeletal: Negative for back pain, myalgias and neck pain.  Skin:  Negative for rash.  Neurological: Negative for dizziness, tingling, tremors, sensory change, speech change, focal weakness and headaches.  Endo/Heme/Allergies: Does not bruise/bleed easily.  Psychiatric/Behavioral: Negative for depression.    MEDICATIONS AT HOME:   Prior to Admission medications   Medication Sig Start Date End Date Taking? Authorizing Provider  acetaminophen (TYLENOL) 500 MG tablet Take 500 mg by mouth every 6 (six) hours as needed for pain.   Yes [provider]  aspirin EC 81 MG tablet Take 81 mg by mouth daily.   Yes [provider]  atorvastatin (LIPITOR) 80 MG tablet Take 80 mg by mouth every evening.    Yes [provider]  cycloSPORINE (RESTASIS) 0.05 % ophthalmic emulsion Place 1 drop into both eyes 2 (two) times daily.  11/17/13  Yes [provider]  DULoxetine (CYMBALTA) 30 MG capsule Take 30 mg by mouth daily.    Yes [provider]  metoprolol tartrate (LOPRESSOR) 25 MG tablet Take 12.5 mg by mouth every 12 (twelve) hours. 06/28/16  Yes [provider]  nitroGLYCERIN (NITROSTAT) 0.4 MG SL tablet Place 0.4 mg under the tongue every 5 (five) minutes x 3 doses as needed. 05/02/16  Yes [provider]  ranolazine (RANEXA) 1000 MG SR tablet Take 1,000 mg by mouth 2 (two) times daily. 11/30/17  Yes [provider]  tiZANidine (ZANAFLEX) 2 MG tablet Take 2 mg by mouth at bedtime as needed. pain 12/12/17  Yes [provider]      VITAL SIGNS:  Blood pressure (!) 156/61, pulse (!) 55, temperature 98 F (36.7 C), temperature source Oral, resp. rate 16, height 5\' 7"  (1.702 m), weight 50.8 kg, SpO2 99 %.  PHYSICAL EXAMINATION:   Physical Exam  GENERAL:  84 y.o.-year-old patient lying in the bed with no acute distress.  EYES: Pupils equal, round, reactive to light and accommodation. No scleral icterus. Extraocular muscles intact.  HEENT: Head atraumatic, normocephalic. Oropharynx and  nasopharynx clear.  NECK:  Supple, no jugular venous distention. No thyroid enlargement, no tenderness.  LUNGS: Normal breath sounds bilaterally, no wheezing, rales,rhonchi or crepitation. No use of accessory muscles of respiration.  CARDIOVASCULAR: S1, S2 normal. No murmurs, rubs, or gallops.  ABDOMEN: Soft, nontender, nondistended. Bowel sounds present. No organomegaly or mass.  EXTREMITIES: No pedal edema, cyanosis, or clubbing.  NEUROLOGIC: Cranial nerves II through XII are intact. Muscle strength 5/5 in all extremities. Sensation intact. Gait not checked.  PSYCHIATRIC: The patient is alert and oriented x 3.  SKIN: No obvious rash, lesion, or ulcer.   LABORATORY PANEL:   CBC Recent Labs  Lab 12/20/17 0626  WBC 4.4  HGB 10.8*  HCT 31.5*  PLT 214   ------------------------------------------------------------------------------------------------------------------  Chemistries  Recent Labs  Lab 12/20/17 0626  NA 139  K 4.5  CL 108  CO2 24  GLUCOSE 97  BUN 28*  CREATININE 1.39*  CALCIUM 8.8*   ------------------------------------------------------------------------------------------------------------------  Cardiac Enzymes Recent Labs  Lab 12/20/17 0626  TROPONINI <0.03   ------------------------------------------------------------------------------------------------------------------  RADIOLOGY:  Dg Chest Port 1 View  Result Date: 12/20/2017 CLINICAL DATA:  Chest pain for 2 days. Previous cardiac history. EXAM: PORTABLE CHEST 1 VIEW COMPARISON:  07/03/2016 FINDINGS: Postoperative changes in the mediastinum. Normal heart size and pulmonary vascularity. Coronary stents are present. Emphysematous changes in the lungs with peribronchial thickening and perihilar interstitial changes consistent with chronic bronchitis. No focal consolidation. No blunting of costophrenic angles. No pneumothorax. Calcification of the aorta. IMPRESSION: Emphysematous and chronic bronchitic  changes in the lungs. No evidence of active pulmonary disease. Aortic atherosclerosis. Electronically Signed   By: Lucienne Capers M.D.   On: 12/20/2017 06:39    EKG:   Orders placed or performed during the hospital encounter of 12/20/17  . ED EKG  . ED EKG  . EKG 12-Lead  . EKG 12-Lead    IMPRESSION AND PLAN:   Vyctoria Dickman  is a 84 y.o. female with a known history of CAD status post stents and CABG in 8921, systolic CHF, anemia of chronic disease, GERD, arthritis, CKD presents to hospital secondary to worsening chest pain.  1. Chest pain-likely unstable angina. -Admit to telemetry under observation.  Recycle troponins -Echocardiogram.  Cardiology consult -Continue aspirin.  Started on Nitropaste.  Also on metoprolol and high intensity statin. -If troponins remain negative, stress test for tomorrow  2.  Hypertension-only on low-dose metoprolol at this time.  Adjust medications based on blood pressures.  3.   CKD stage III-Baseline creatinine seems to be around 1.5 and 1.6. -Creatinine seems to be stable at this time.  Avoid nephrotoxins and monitor closely.  4.  Arthritis and spasms-continue home medications  5.  DVT prophylaxis-on Lovenox.    All the records are reviewed and case discussed with ED provider. Management plans discussed with the patient, family and they are in agreement.  CODE STATUS: Full Code  TOTAL TIME TAKING CARE OF THIS PATIENT: 50 minutes.    Gladstone Lighter M.D on 12/20/2017 at 8:29 AM  Between 7am to 6pm - Pager - [phone]  After 6pm go to [email or web address] - password [email or web address] [facility]  Office  [phone]  CC: Primary care physician; Adin Hector, MD

## 2017-12-20 NOTE — Consult Note (Signed)
[facility]  CARDIOLOGY CONSULT NOTE  Patient ID: Monique Burns MRN: [id number] DOB/AGE: [birth year] 84 y.o.  Admit date: 12/20/2017 Referring Physician Tressia Miners Primary Physician Ramonita Lab, MD Primary Cardiologist [facility] Reason for Consultation Unstable angina  HPI: 84 year old female referred for evaluation of unstable angina. The patient has known coronary artery disease, status post anterior STEMI and stent LAD 02/28/2015, with a repeat DES for in-stent restenosis 05/06/2015, status post CABG x 4 in 01/2016, postoperative atrial fibrillation with RVR, without recurrence, and not on chronic anticoagulation, chronic diastolic heart failure, hyperlipidemia, hypertension, and chronic kidney disease stage III.  The patient reports feeling generally unwell on 12/18/2017 with intermittent left-sided chest discomfort at rest, described as pressure-like, with radiation to her left upper arm.  She took a sublingual nitroglycerin with some improvement in the chest pain.  Throughout the day yesterday, she had recurrent waxing and waning chest discomfort with radiation to her left arm, which seemed to last longer than the day before.  She had recurrent chest discomfort last night which was more intense, with associated nausea at rest, with some improvement with sublingual nitroglycerin. Due to the intensity in the chest pain, she called EMS. She states that this chest discomfort with radiation felt similar to what she experienced when she had her MI in 2016.  EKG revealed sinus rhythm at a rate of 59 bpm with anterior Q waves and nonspecific ST elevation in lead V2, which is similar to previous EKGs in the past.  Initial troponin was less than 0.03.  Other labs remarkable for creatinine 1.39, BUN 28, GFR 34, hemoglobin 10.8, and hematocrit 31.5.  Currently, the patient denies chest pain, shortness of breath, or nausea.  She has some lingering left elbow pain.  Review of systems complete and found to be  negative unless listed above     Past Medical History:  Diagnosis Date  . Anemia   . Arthritis   . Blood dyscrasia    since being put on Brilinta after stent placement developing hematoma  . Cancer ([place])    basal cell left leg  . CHF (congestive heart failure) ([place])   . GERD (gastroesophageal reflux disease)   . Headache    h/o migraines  . Hyperlipidemia   . Hypertension   . Leukopenia   . MI (myocardial infarction) ([place]) 02/2015   s/p 4 stents and CABG in 2017  . Renal disorder    stage 3-Dr Caryl Comes keeping a check on kidney function  . Shortness of breath dyspnea    mild compared to prior stenting-pt states she would have to stop and rest if walking a mile  . Thyroid disease     Past Surgical History:  Procedure Laterality Date  . ABDOMINAL HYSTERECTOMY    . CARDIAC CATHETERIZATION N/A 12/08/2015   Procedure: Left Heart Cath and Coronary Angiography;  Surgeon: Yolonda Kida, MD;  Location: [facility];  Service: Cardiovascular;  Laterality: N/A;  . CORONARY ANGIOPLASTY WITH STENT PLACEMENT    . CORONARY STENT PLACEMENT  2016   x2  . EYE SURGERY Bilateral 2011  . INCISION AND DRAINAGE Left 08/26/2015   Procedure: INCISION AND DRAINAGE / HAND HEMATOMA;  Surgeon: Hessie Knows, MD;  Location: [facility];  Service: Orthopedics;  Laterality: Left;  . JOINT REPLACEMENT Bilateral    left 2016 and right 2015  . TONSILLECTOMY     3rd grade    Medications Prior to Admission  Medication Sig Dispense Refill Last Dose  . acetaminophen (  TYLENOL) 500 MG tablet Take 500 mg by mouth every 6 (six) hours as needed for pain.   PRN at PRN  . aspirin EC 81 MG tablet Take 81 mg by mouth daily.   12/19/2017 at 0800  . atorvastatin (LIPITOR) 80 MG tablet Take 80 mg by mouth every evening.    12/19/2017 at 1900  . cycloSPORINE (RESTASIS) 0.05 % ophthalmic emulsion Place 1 drop into both eyes 2 (two) times daily.    12/19/2017 at 1900  . DULoxetine (CYMBALTA) 30 MG capsule Take 30 mg by  mouth daily.   2 12/19/2017 at 1900  . metoprolol tartrate (LOPRESSOR) 25 MG tablet Take 12.5 mg by mouth every 12 (twelve) hours.   12/19/2017 at 0800  . nitroGLYCERIN (NITROSTAT) 0.4 MG SL tablet Place 0.4 mg under the tongue every 5 (five) minutes x 3 doses as needed.   PRN at PRN  . ranolazine (RANEXA) 1000 MG SR tablet Take 1,000 mg by mouth 2 (two) times daily.  11 12/19/2017 at 1700  . tiZANidine (ZANAFLEX) 2 MG tablet Take 2 mg by mouth at bedtime as needed. pain  11 PRN at PRN   Social History   Socioeconomic History  . Marital status: Married    Spouse name: Not on file  . Number of children: Not on file  . Years of education: Not on file  . Highest education level: Not on file  Occupational History  . Not on file  Social Needs  . Financial resource strain: Not on file  . Food insecurity:    Worry: Not on file    Inability: Not on file  . Transportation needs:    Medical: Not on file    Non-medical: Not on file  Tobacco Use  . Smoking status: Never Smoker  . Smokeless tobacco: Never Used  Substance and Sexual Activity  . Alcohol use: No  . Drug use: No  . Sexual activity: Not on file  Lifestyle  . Physical activity:    Days per week: Not on file    Minutes per session: Not on file  . Stress: Not on file  Relationships  . Social connections:    Talks on phone: Not on file    Gets together: Not on file    Attends religious service: Not on file    Active member of club or organization: Not on file    Attends meetings of clubs or organizations: Not on file    Relationship status: Not on file  . Intimate partner violence:    Fear of current or ex partner: Not on file    Emotionally abused: Not on file    Physically abused: Not on file    Forced sexual activity: Not on file  Other Topics Concern  . Not on file  Social History Narrative   Lives at home with husband   Independent at baseline    Family History  Problem Relation Age of Onset  . Breast cancer  Paternal Grandmother   . Brain cancer Mother   . Stroke Father   . Brain cancer Son       Review of systems complete and found to be negative unless listed above      PHYSICAL EXAM  General: Elderly female in no acute distress lying in bed HEENT:  Normocephalic and atramatic Neck:  No JVD.  Lungs: Clear bilaterally to auscultation, normal effort of breathing on room air Heart: HRRR . Normal S1 and S2 without gallops or murmurs.  Abdomen: Bowel sounds are positive, abdomen nondistended   Msk:  Back normal.  Gait not assessed. Normal tone for age. Extremities: No clubbing, cyanosis or edema.   Neuro: Alert and oriented X 3. Psych:  Good affect, responds appropriately  Labs:   Lab Results  Component Value Date   WBC 4.4 12/20/2017   HGB 10.8 (L) 12/20/2017   HCT 31.5 (L) 12/20/2017   MCV 97.6 12/20/2017   PLT 214 12/20/2017    Recent Labs  Lab 12/20/17 0626  NA 139  K 4.5  CL 108  CO2 24  BUN 28*  CREATININE 1.39*  CALCIUM 8.8*  GLUCOSE 97   Lab Results  Component Value Date   TROPONINI <0.03 12/20/2017   No results found for: CHOL No results found for: HDL No results found for: LDLCALC No results found for: TRIG No results found for: CHOLHDL No results found for: LDLDIRECT    Radiology: Dg Chest Port 1 View  Result Date: 12/20/2017 CLINICAL DATA:  Chest pain for 2 days. Previous cardiac history. EXAM: PORTABLE CHEST 1 VIEW COMPARISON:  07/03/2016 FINDINGS: Postoperative changes in the mediastinum. Normal heart size and pulmonary vascularity. Coronary stents are present. Emphysematous changes in the lungs with peribronchial thickening and perihilar interstitial changes consistent with chronic bronchitis. No focal consolidation. No blunting of costophrenic angles. No pneumothorax. Calcification of the aorta. IMPRESSION: Emphysematous and chronic bronchitic changes in the lungs. No evidence of active pulmonary disease. Aortic atherosclerosis. Electronically  Signed   By: Lucienne Capers M.D.   On: 12/20/2017 06:39    EKG: Sinus rhythm at a rate of 59 bpm with nonspecific ST elevation in lead V2  ASSESSMENT AND PLAN:  1.  Chest pain, with a history of coronary artery disease, status post CABG x4 in 2017, with negative initial troponin, and EKG with nonspecific ST elevation in V2, similar to previous EKGs in the past, without acute ST or T wave abnormalities. 2.  History of coronary artery disease, status post anterior STEMI and stent LAD 02/28/2015, with a repeat DES for in-stent restenosis 05/06/2015, status post CABG x 4 in 01/2016. 3.  Hypertension, mildly elevated this morning 4.  Hyperlipidemia 5.  Chronic kidney disease, creatinine 1.39, BUN 28, GFR 34  Plan: 1. Agree with plan 2. Continue to cycle cardiac enzymes 3. Plan for Lexiscan Myoview in the morning 4. 2D echocardiogram 5. Continue aspirin, atorvastatin, metoprolol, and Ranexa   Signed: Clabe Seal PA-C 12/20/2017, 10:25 AM

## 2017-12-20 NOTE — Progress Notes (Addendum)
Wrong patient

## 2017-12-20 NOTE — ED Notes (Signed)
Bethel Born, RN given report.

## 2017-12-21 ENCOUNTER — Ambulatory Visit: Payer: Medicare Other

## 2017-12-21 DIAGNOSIS — I25110 Atherosclerotic heart disease of native coronary artery with unstable angina pectoris: Secondary | ICD-10-CM | POA: Diagnosis not present

## 2017-12-21 LAB — BASIC METABOLIC PANEL
Anion gap: 5 (ref 5–15)
BUN: 32 mg/dL — ABNORMAL HIGH (ref 8–23)
CALCIUM: 9 mg/dL (ref 8.9–10.3)
CO2: 26 mmol/L (ref 22–32)
CREATININE: 1.38 mg/dL — AB (ref 0.44–1.00)
Chloride: 106 mmol/L (ref 98–111)
GFR, EST AFRICAN AMERICAN: 39 mL/min — AB (ref >60)
GFR, EST NON AFRICAN AMERICAN: 34 mL/min — AB (ref >60)
Glucose, Bld: 106 mg/dL — ABNORMAL HIGH (ref 70–99)
Potassium: 4.5 mmol/L (ref 3.5–5.1)
SODIUM: 137 mmol/L (ref 135–145)

## 2017-12-21 LAB — CBC
HCT: 29.4 % — ABNORMAL LOW (ref 35.0–47.0)
Hemoglobin: 10.1 g/dL — ABNORMAL LOW (ref 12.0–16.0)
MCH: 33.3 pg (ref 26.0–34.0)
MCHC: 34.5 g/dL (ref 32.0–36.0)
MCV: 96.5 fL (ref 80.0–100.0)
PLATELETS: 209 10*3/uL (ref 150–440)
RBC: 3.05 MIL/uL — AB (ref 3.80–5.20)
RDW: 13 % (ref 11.5–14.5)
WBC: 4.2 10*3/uL (ref 3.6–11.0)

## 2017-12-21 LAB — NM MYOCAR MULTI W/SPECT W/WALL MOTION / EF
CHL CUP MPHR: 135 {beats}/min
CHL CUP NUCLEAR SRS: 12
CSEPHR: 53 %
Estimated workload: 1 METS
Exercise duration (min): 1 min
Exercise duration (sec): 0 s
LV dias vol: 57 mL (ref 46–106)
LVSYSVOL: 15 mL
Peak HR: 72 {beats}/min
Rest HR: 57 {beats}/min
SDS: 0
SSS: 0
TID: 0.95

## 2017-12-21 LAB — URINALYSIS, ROUTINE W REFLEX MICROSCOPIC
BILIRUBIN URINE: NEGATIVE
Glucose, UA: NEGATIVE mg/dL
HGB URINE DIPSTICK: NEGATIVE
Ketones, ur: NEGATIVE mg/dL
Leukocytes, UA: NEGATIVE
NITRITE: NEGATIVE
PROTEIN: NEGATIVE mg/dL
Specific Gravity, Urine: 1.01 (ref 1.005–1.030)
pH: 6 (ref 5.0–8.0)

## 2017-12-21 MED ORDER — AMLODIPINE BESYLATE 2.5 MG PO TABS: 2.5000 mg | ORAL_TABLET | Freq: Every day | ORAL | 11 refills | Status: DC

## 2017-12-21 MED ORDER — REGADENOSON 0.4 MG/5ML IV SOLN
0.4000 mg | Freq: Once | INTRAVENOUS | Status: AC
  Administered 2017-12-21: 0.4 mg via INTRAVENOUS

## 2017-12-21 MED ORDER — TECHNETIUM TC 99M TETROFOSMIN IV KIT
30.0000 | PACK | Freq: Once | INTRAVENOUS | Status: AC | PRN
  Administered 2017-12-21: 32.13 via INTRAVENOUS

## 2017-12-21 MED ORDER — TECHNETIUM TC 99M TETROFOSMIN IV KIT
13.0000 | PACK | Freq: Once | INTRAVENOUS | Status: AC | PRN
  Administered 2017-12-21: 13 via INTRAVENOUS

## 2017-12-21 MED ORDER — AMLODIPINE BESYLATE 5 MG PO TABS
2.5000 mg | ORAL_TABLET | Freq: Every day | ORAL | Status: DC
  Administered 2017-12-21: 2.5 mg via ORAL
  Filled 2017-12-21: qty 1

## 2017-12-21 NOTE — Progress Notes (Signed)
[facility] [place] NAME: Monique Burns    MR#:  [id number]  DATE OF BIRTH:  August 22, 1930  SUBJECTIVE:  CHIEF COMPLAINT:   Chief Complaint  Patient presents with  . Chest Pain   -2 episodes of sharp pain in the chest this morning.  For Myoview today  REVIEW OF SYSTEMS:  Review of Systems  Constitutional: Negative for chills, fever and malaise/fatigue.  HENT: Negative for congestion, ear discharge, hearing loss and nosebleeds.   Respiratory: Negative for cough, shortness of breath and wheezing.   Cardiovascular: Positive for chest pain. Negative for palpitations and leg swelling.  Gastrointestinal: Negative for abdominal pain, constipation, diarrhea, nausea and vomiting.  Genitourinary: Negative for dysuria.  Musculoskeletal: Negative for myalgias.  Neurological: Negative for dizziness, focal weakness, seizures, weakness and headaches.  Psychiatric/Behavioral: Negative for depression.    DRUG ALLERGIES:   Allergies  Allergen Reactions  . Brilinta [Ticagrelor] Other (See Comments)    "caused stent to fail"  . Imdur [Isosorbide Nitrate] Other (See Comments)    Headache    VITALS:  Blood pressure (!) 158/48, pulse (!) 59, temperature 97.9 F (36.6 C), temperature source Oral, resp. rate 14, height 5\' 7"  (1.702 m), weight 50.8 kg, SpO2 100 %.  PHYSICAL EXAMINATION:  Physical Exam  GENERAL:  84 y.o.-year-old patient lying in the bed with no acute distress.  EYES: Pupils equal, round, reactive to light and accommodation. No scleral icterus. Extraocular muscles intact.  HEENT: Head atraumatic, normocephalic. Oropharynx and nasopharynx clear.  NECK:  Supple, no jugular venous distention. No thyroid enlargement, no tenderness.  LUNGS: Normal breath sounds bilaterally, no wheezing, rales,rhonchi or crepitation. No use of accessory muscles of respiration.  CARDIOVASCULAR: S1, S2 normal. No  rubs, or gallops. 3/6 systolic murmur  present ABDOMEN: Soft, nontender, nondistended. Bowel sounds present. No organomegaly or mass.  EXTREMITIES: No pedal edema, cyanosis, or clubbing.  NEUROLOGIC: Cranial nerves II through XII are intact. Muscle strength 5/5 in all extremities. Sensation intact. Gait not checked.  PSYCHIATRIC: The patient is alert and oriented x 3.  SKIN: No obvious rash, lesion, or ulcer.   LABORATORY PANEL:   CBC Recent Labs  Lab 12/21/17 0428  WBC 4.2  HGB 10.1*  HCT 29.4*  PLT 209   ------------------------------------------------------------------------------------------------------------------  Chemistries  Recent Labs  Lab 12/21/17 0428  NA 137  K 4.5  CL 106  CO2 26  GLUCOSE 106*  BUN 32*  CREATININE 1.38*  CALCIUM 9.0   ------------------------------------------------------------------------------------------------------------------  Cardiac Enzymes Recent Labs  Lab 12/20/17 2122  TROPONINI <0.03   ------------------------------------------------------------------------------------------------------------------  RADIOLOGY:  Dg Chest Port 1 View  Result Date: 12/20/2017 CLINICAL DATA:  Chest pain for 2 days. Previous cardiac history. EXAM: PORTABLE CHEST 1 VIEW COMPARISON:  07/03/2016 FINDINGS: Postoperative changes in the mediastinum. Normal heart size and pulmonary vascularity. Coronary stents are present. Emphysematous changes in the lungs with peribronchial thickening and perihilar interstitial changes consistent with chronic bronchitis. No focal consolidation. No blunting of costophrenic angles. No pneumothorax. Calcification of the aorta. IMPRESSION: Emphysematous and chronic bronchitic changes in the lungs. No evidence of active pulmonary disease. Aortic atherosclerosis. Electronically Signed   By: Lucienne Capers M.D.   On: 12/20/2017 06:39    EKG:   Orders placed or performed during the hospital encounter of 12/20/17  . ED EKG  . ED EKG  . EKG 12-Lead  . EKG  12-Lead    ASSESSMENT AND PLAN:   Monique Burns  is a  84 y.o. female with a known history of CAD status post stents and CABG in 4128, systolic CHF, anemia of chronic disease, GERD, arthritis, CKD presents to hospital secondary to worsening chest pain.  1. Chest pain-likely unstable angina. -negative troponins -Echocardiogram with EF of 55 to 65%.  No wall motion abnormalities.  Cardiology consult -Continue aspirin.  is on Nitropaste here.  Also on metoprolol and high intensity statin. -For Myoview today.  If negative, can be discharged.  Patient cannot tolerate Imdur.  2.  Hypertension-only on low-dose metoprolol at this time.  -Might need low-dose Norvasc as blood pressure is elevated  3.   CKD stage III-Baseline creatinine seems to be around 1.5 and 1.6. -Creatinine seems to be stable at this time.  Avoid nephrotoxins and monitor closely.  4.  Arthritis and spasms-continue home medications  5.  DVT prophylaxis-on Lovenox.  Ambulatory at baseline   All the records are reviewed and case discussed with Care Management/Social Workerr. Management plans discussed with the patient, family and they are in agreement.  CODE STATUS: Full Code  TOTAL TIME TAKING CARE OF THIS PATIENT: 37 minutes.   POSSIBLE D/C  TODAY, DEPENDING ON CLINICAL CONDITION.   Gladstone Lighter M.D on 12/21/2017 at 12:41 PM  Between 7am to 6pm - Pager - [phone]  After 6pm go to [email or web address] - password [email or web address] [facility]  Office  [phone]  CC: Primary care physician; Adin Hector, MD

## 2017-12-21 NOTE — Progress Notes (Signed)
Discharged to home with husband.  Gave her her first dose of amlodipine,  No change in BP, no c/o dizziness, nor weakness.

## 2017-12-21 NOTE — Discharge Summary (Signed)
[facility] NAME: Monique Burns    MR#:  [id number]  DATE OF BIRTH:  [birth year]  DATE OF ADMISSION:  12/20/2017   ADMITTING PHYSICIAN: Gladstone Lighter, MD  DATE OF DISCHARGE: 12/21/17  PRIMARY CARE PHYSICIAN: Adin Hector, MD   ADMISSION DIAGNOSIS:   Chest pain, unspecified type [R07.9]  DISCHARGE DIAGNOSIS:   Active Problems:   Unstable angina ([place])   SECONDARY DIAGNOSIS:   Past Medical History:  Diagnosis Date  . Anemia   . Arthritis   . Blood dyscrasia    since being put on Brilinta after stent placement developing hematoma  . Cancer ([place])    basal cell left leg  . CHF (congestive heart failure) ([place])   . GERD (gastroesophageal reflux disease)   . Headache    h/o migraines  . Hyperlipidemia   . Hypertension   . Leukopenia   . MI (myocardial infarction) ([place]) 02/2015   s/p 4 stents and CABG in 2017  . Renal disorder    stage 3-Dr Caryl Comes keeping a check on kidney function  . Shortness of breath dyspnea    mild compared to prior stenting-pt states she would have to stop and rest if walking a mile  . Thyroid disease     HOSPITAL COURSE:   JeanGarrisonis a6 y.o.femalewith a known history of CAD status post stents and CABG in 7106, systolic CHF,anemia of chronic disease, GERD, arthritis, CKD presents to hospital secondary to worsening chest pain.  1. Chest pain-likely unstable angina. -negative troponins -Echocardiogram with EF of 55 to 65%.  No wall motion abnormalities. Cardiology consult appreciated -Continue aspirin.  Also on metoprolol and high intensity statin. -Myoview showing small apical defect likely consistent with prior MI.  EF is greater than 65%.  No other acute findings.  Low risk study.   -Cannot do Imdur due to significant side effects in the past.  Monitor as outpatient.  Patient already on high-dose Ranexa at this time  2. Hypertension-only on low-dose metoprolol at  this time.  -low-dose Norvasc added as blood pressure is elevated  3. CKD stage III-Baseline creatinine seems to be around 1.5 and 1.6. -Creatinine seems to be stable at this time. Avoid nephrotoxins and monitor closely.  4. Arthritis and spasms-continue home medications    Ambulatory at baseline, will be discharged home today  DISCHARGE CONDITIONS:   Guarded  CONSULTS OBTAINED:   Treatment Team:  Isaias Cowman, MD  DRUG ALLERGIES:   Allergies  Allergen Reactions  . Brilinta [Ticagrelor] Other (See Comments)    "caused stent to fail"  . Imdur [Isosorbide Nitrate] Other (See Comments)    Headache   DISCHARGE MEDICATIONS:   Allergies as of 12/21/2017      Reactions   Brilinta [ticagrelor] Other (See Comments)   "caused stent to fail"   Imdur [isosorbide Nitrate] Other (See Comments)   Headache      Medication List    TAKE these medications   acetaminophen 500 MG tablet Commonly known as:  TYLENOL Take 500 mg by mouth every 6 (six) hours as needed for pain. Notes to patient:  None given today   amLODipine 2.5 MG tablet Commonly known as:  NORVASC Take 1 tablet (2.5 mg total) by mouth daily.   aspirin EC 81 MG tablet Take 81 mg by mouth daily.   atorvastatin 80 MG tablet Commonly known as:  LIPITOR Take 80 mg by mouth every evening.   DULoxetine 30 MG  capsule Commonly known as:  CYMBALTA Take 30 mg by mouth daily.   metoprolol tartrate 25 MG tablet Commonly known as:  LOPRESSOR Take 12.5 mg by mouth every 12 (twelve) hours.   nitroGLYCERIN 0.4 MG SL tablet Commonly known as:  NITROSTAT Place 0.4 mg under the tongue every 5 (five) minutes x 3 doses as needed.   ranolazine 1000 MG SR tablet Commonly known as:  RANEXA Take 1,000 mg by mouth 2 (two) times daily.   RESTASIS 0.05 % ophthalmic emulsion Generic drug:  cycloSPORINE Place 1 drop into both eyes 2 (two) times daily.   tiZANidine 2 MG tablet Commonly known as:  ZANAFLEX Take  2 mg by mouth at bedtime as needed. pain Notes to patient:  NONE GIVEN TODAY        DISCHARGE INSTRUCTIONS:   1.  PCP follow-up in 1 to 2 weeks 2.  Cardiology follow-up in 1 to 2 weeks  DIET:   Cardiac diet  ACTIVITY:   Activity as tolerated  OXYGEN:   Home Oxygen: No.  Oxygen Delivery: room air  DISCHARGE LOCATION:   home   If you experience worsening of your admission symptoms, develop shortness of breath, life threatening emergency, suicidal or homicidal thoughts you must seek medical attention immediately by calling 911 or calling your MD immediately  if symptoms less severe.  You Must read complete instructions/literature along with all the possible adverse reactions/side effects for all the Medicines you take and that have been prescribed to you. Take any new Medicines after you have completely understood and accpet all the possible adverse reactions/side effects.   Please note  You were cared for by a hospitalist during your hospital stay. If you have any questions about your discharge medications or the care you received while you were in the hospital after you are discharged, you can call the unit and asked to speak with the hospitalist on call if the hospitalist that took care of you is not available. Once you are discharged, your primary care physician will handle any further medical issues. Please note that NO REFILLS for any discharge medications will be authorized once you are discharged, as it is imperative that you return to your primary care physician (or establish a relationship with a primary care physician if you do not have one) for your aftercare needs so that they can reassess your need for medications and monitor your lab values.    On the day of Discharge:  VITAL SIGNS:   Blood pressure (!) 158/48, pulse (!) 59, temperature 97.9 F (36.6 C), temperature source Oral, resp. rate 14, height 5\' 7"  (1.702 m), weight 50.8 kg, SpO2 100 %.  PHYSICAL  EXAMINATION:   GENERAL:84 y.o.-year-old patient lying in the bed with no acute distress.  EYES: Pupils equal, round, reactive to light and accommodation. No scleral icterus. Extraocular muscles intact.  HEENT: Head atraumatic, normocephalic. Oropharynx and nasopharynx clear.  NECK: Supple, no jugular venous distention. No thyroid enlargement, no tenderness.  LUNGS: Normal breath sounds bilaterally, no wheezing, rales,rhonchi or crepitation. No use of accessory muscles of respiration.  CARDIOVASCULAR: S1, S2 normal. No  rubs, or gallops. 3/6 systolic murmur present ABDOMEN: Soft, nontender, nondistended. Bowel sounds present. No organomegaly or mass.  EXTREMITIES: No pedal edema, cyanosis, or clubbing.  NEUROLOGIC: Cranial nerves II through XII are intact. Muscle strength 5/5 in all extremities. Sensation intact. Gait not checked.  PSYCHIATRIC: The patient is alert and oriented x 3.  SKIN: No obvious rash, lesion, or ulcer.  DATA REVIEW:   CBC Recent Labs  Lab 12/21/17 0428  WBC 4.2  HGB 10.1*  HCT 29.4*  PLT 209    Chemistries  Recent Labs  Lab 12/21/17 0428  NA 137  K 4.5  CL 106  CO2 26  GLUCOSE 106*  BUN 32*  CREATININE 1.38*  CALCIUM 9.0     Microbiology Results  No results found for this or any previous visit.  RADIOLOGY:  Nm Myocar Multi W/spect W/wall Motion / Ef  Result Date: 12/21/2017  Blood pressure demonstrated a normal response to exercise.  Defect 1: There is a small defect of mild severity present in the apex location.  Findings consistent with prior myocardial infarction.  This is a low risk study.  The left ventricular ejection fraction is hyperdynamic (>65%).      Management plans discussed with the patient, family and they are in agreement.  CODE STATUS:     Code Status Orders  (From admission, onward)         Start     Ordered   12/20/17 0936  Full code  Continuous     12/20/17 0936        Code Status History    Date  Active Date Inactive Code Status Order ID Comments User Context   12/08/2015 1539 12/08/2015 1938 Full Code [id number]  Yolonda Kida, MD Inpatient   11/26/2015 1101 11/27/2015 1747 Full Code [id number]  Dustin Flock, MD ED    Advance Directive Documentation     Most Recent Value  Type of Advance Directive  Healthcare Power of [place], Out of facility DNR (pink MOST or yellow form)  Pre-existing out of facility DNR order (yellow form or pink MOST form)  -  "MOST" Form in Place?  -      TOTAL TIME TAKING CARE OF THIS PATIENT: 38 minutes.    Gladstone Lighter M.D on 12/21/2017 at 3:25 PM  Between 7am to 6pm - Pager - [phone]  After 6pm go to [email or web address] - password [email or web address] [facility]  [facility]  Office  [phone]  CC: Primary care physician; Adin Hector, MD   Note: This dictation was prepared with Dragon dictation along with smaller phrase technology. Any transcriptional errors that result from this process are unintentional.

## 2018-01-18 ENCOUNTER — Encounter: Payer: Self-pay | Admitting: Dietician

## 2018-01-18 ENCOUNTER — Encounter: Payer: Medicare Other | Attending: Internal Medicine | Admitting: Dietician

## 2018-01-18 VITALS — Ht 67.0 in | Wt 113.0 lb

## 2018-01-18 DIAGNOSIS — Z681 Body mass index (BMI) 19 or less, adult: Secondary | ICD-10-CM | POA: Diagnosis not present

## 2018-01-18 DIAGNOSIS — E162 Hypoglycemia, unspecified: Secondary | ICD-10-CM | POA: Insufficient documentation

## 2018-01-18 NOTE — Progress Notes (Signed)
Medical Nutrition Therapy: Visit start time: 1330  end time: 1430  Assessment:  Diagnosis: hypoglycemia Past medical history: Severe protein-calorie malnutrition, CHF, CKD stg III, HLD, Arthritis, see chart Psychosocial issues/ stress concerns: none at this time  Preferred learning method:  . Auditory . Visual . Hands-on  Current weight: 113#  Height: 5\' 7"  Medications, supplements: Childrens chewable daily MVI  Progress and evaluation: Pt reports confusion about what she should be eating in order to best manage her health conditions. 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 Eats small portions. Physcial signs of malnutrition apparent; visible clavicles, sternocleomastoid muscles, lack of trapezius muscles, sunken temples. Lowest BW 103# during hospitalization. H/o Vitamin B12 deficiency. Though she does eat meat she describes preferring fruits and vegetables over other foods. She is trying to increase her snack intake as instructed by her doctor d/t underweight status. Her daughter buys her Premier Protein shakes and other high protein food options to choose from to encourage her to eat. She likes a variety of protein sources including chicken, eggs, steak, cottage cheese, beans, peanut butter, fish and tries to consume them regularly though only eats a few bites. Her husband assists her with grocery shopping and meal preparation. They buy fresh vegetables most often and monitor their sodium and sugar intake by reading food labels; husband is a diabetic.  Physical activity: not at this time  Dietary Intake:  Usual eating pattern includes 5-6 mini meals / snacks per day. Dining out frequency: 3-4 meals per week but not as much during the summertime.  Breakfast: waffles, french toast, eggs (more often now) Snack: pastry item Lunch: leftovers, fruit, 2-3 crackers with peanut  butter, nuts, hot dog, Premier Protein shake every few days Snack: fruit or nuts Supper: cabbage + cornbread + fruit, hamburger, vegetables, sweet potato, pasta occasionally, chicken, steak once every 2 weeks, pizza once every two months Snack: trying more frequently; fruit, cottage cheese with fruit or tomatoes infrequently Beverages: decaf coffee, pepsi once every two months, water  Nutrition Care Education: Topics covered: foods that contain protein, how to incorporate liquid calories/ nutrition IE protein shakes, fresh vs. Frozen vegetables, sources of dietary fat Basic nutrition: basic food groups, appropriate nutrient balance, appropriate meal and snack schedule, general nutrition guidelines    Weight control: benefits of weight gain/ maintenance, determining reasonable weight goal, behavioral changes for weight gain Hypoglycemia: appropriate meal and snack schedule, appropriate carb intake and balance, prioritizing protein, symptoms of hypoglycemia Other lifestyle changes: benefits of making changes, readiness for change, identifying habits that need to change  Nutritional Diagnosis:  NI-1.6 Predicted suboptional energy  As related to dx of protein-calorie malnutrition and non-diabetic related hypoglycemia.  As evidenced by BMI <18, patient report of sx of hypoglycemia and small appetite, recent h/o falls.  Intervention: Discussion as noted above. Pt agrees to work on increasing her daily caloric intake. Initial wt gain goal of 5# was set, with an ultimate wt gain goal of 15-20#. She will be more consistent about eating an evening snack, eating protein with meals/snacks, and drinking Premier Protein shakes more regularly. She will continue to monitor sodium intake and prioritize hydration.   Education Materials given:  . Renal diet food pyramid / foods to limit . Non-diabetic hypoglycemia nutrition therapy . Goals/ instructions  Learner/ who was taught:  . Patient  . Spouse/ partner:  Husband  Level of understanding: .  Verbalizes/ demonstrates competency  Demonstrated degree of understanding via:   Teach back Learning barriers: . None  Willingness to learn/ readiness for change: . Eager, change in progress  Monitoring and Evaluation:  Dietary intake, meal frequency, and body weight      follow up: prn; email and phone number provided for future questions

## 2018-01-18 NOTE — Patient Instructions (Addendum)
   Start eating cottage with fruit or tomato at night before bed [place] has casein protein which digests slower and may be helpful to prevent low blood sugar during the night  Continue to practice eating several small meals throughout the day. Prioritize eating protein and carbohydrates together. Use premier protein shakes to "fill in" nutritional gaps more often. Ideally, drink a protein shake once per day between meals  Choose fuller fat foods to help maintain weight  Continue to prioritize fluid intake, and to monitor sodium (salt) intake

## 2018-06-27 ENCOUNTER — Emergency Department: Payer: Medicare Other

## 2018-06-27 ENCOUNTER — Emergency Department
Admission: EM | Admit: 2018-06-27 | Discharge: 2018-06-27 | Disposition: A | Discharge status: Home / Self Care | Payer: Medicare Other | Attending: Emergency Medicine | Admitting: Emergency Medicine

## 2018-06-27 ENCOUNTER — Encounter: Payer: Self-pay | Admitting: Emergency Medicine

## 2018-06-27 ENCOUNTER — Other Ambulatory Visit: Payer: Self-pay

## 2018-06-27 DIAGNOSIS — Z85828 Personal history of other malignant neoplasm of skin: Secondary | ICD-10-CM | POA: Insufficient documentation

## 2018-06-27 DIAGNOSIS — R0602 Shortness of breath: Secondary | ICD-10-CM | POA: Diagnosis not present

## 2018-06-27 DIAGNOSIS — I130 Hypertensive heart and chronic kidney disease with heart failure and stage 1 through stage 4 chronic kidney disease, or unspecified chronic kidney disease: Secondary | ICD-10-CM | POA: Insufficient documentation

## 2018-06-27 DIAGNOSIS — R0789 Other chest pain: Secondary | ICD-10-CM | POA: Insufficient documentation

## 2018-06-27 DIAGNOSIS — N189 Chronic kidney disease, unspecified: Secondary | ICD-10-CM | POA: Diagnosis not present

## 2018-06-27 DIAGNOSIS — R42 Dizziness and giddiness: Secondary | ICD-10-CM | POA: Diagnosis not present

## 2018-06-27 DIAGNOSIS — R001 Bradycardia, unspecified: Secondary | ICD-10-CM | POA: Insufficient documentation

## 2018-06-27 DIAGNOSIS — R110 Nausea: Secondary | ICD-10-CM

## 2018-06-27 DIAGNOSIS — Z955 Presence of coronary angioplasty implant and graft: Secondary | ICD-10-CM | POA: Diagnosis not present

## 2018-06-27 DIAGNOSIS — I509 Heart failure, unspecified: Secondary | ICD-10-CM | POA: Diagnosis not present

## 2018-06-27 LAB — BASIC METABOLIC PANEL
ANION GAP: 8 (ref 5–15)
BUN: 35 mg/dL — ABNORMAL HIGH (ref 8–23)
CALCIUM: 8.9 mg/dL (ref 8.9–10.3)
CHLORIDE: 106 mmol/L (ref 98–111)
CO2: 23 mmol/L (ref 22–32)
Creatinine, Ser: 1.57 mg/dL — ABNORMAL HIGH (ref 0.44–1.00)
GFR calc non Af Amer: 30 mL/min — ABNORMAL LOW (ref >60)
GFR, EST AFRICAN AMERICAN: 34 mL/min — AB (ref >60)
Glucose, Bld: 117 mg/dL — ABNORMAL HIGH (ref 70–99)
Potassium: 3.7 mmol/L (ref 3.5–5.1)
Sodium: 137 mmol/L (ref 135–145)

## 2018-06-27 LAB — CBC
HEMATOCRIT: 30.8 % — AB (ref 36.0–46.0)
HEMOGLOBIN: 9.8 g/dL — AB (ref 12.0–15.0)
MCH: 31.6 pg (ref 26.0–34.0)
MCHC: 31.8 g/dL (ref 30.0–36.0)
MCV: 99.4 fL (ref 80.0–100.0)
NRBC: 0 % (ref 0.0–0.2)
PLATELETS: 251 10*3/uL (ref 150–400)
RBC: 3.1 MIL/uL — AB (ref 3.87–5.11)
RDW: 12.7 % (ref 11.5–15.5)
WBC: 4.5 10*3/uL (ref 4.0–10.5)

## 2018-06-27 LAB — TROPONIN I
Troponin I: <0.03 ng/mL (ref <0.03)
Troponin I: <0.03 ng/mL (ref <0.03)

## 2018-06-27 LAB — BRAIN NATRIURETIC PEPTIDE: B NATRIURETIC PEPTIDE 5: 324 pg/mL — AB (ref 0.0–100.0)

## 2018-06-27 NOTE — ED Notes (Signed)
ED Provider at bedside. 

## 2018-06-27 NOTE — ED Notes (Signed)
Patient transported to X-ray 

## 2018-06-27 NOTE — Discharge Instructions (Addendum)
Please skip your metoprolol tonight, then decrease your metoprolol to 12.5mg  at bedtime only (no longer take a morning dose).  Do NOT start the new medication that Dr. call would prescribe for you yesterday; he will reevaluate your medications at your follow-up appointment.  Return to the emergency department if you develop severe pain, chest pain, lightheadedness or fainting, shortness of breath, or any other symptoms concerning to you.

## 2018-06-27 NOTE — ED Provider Notes (Signed)
[facility] Emergency Department Provider Note  ____________________________________________  Time seen: Approximately 3:21 PM  I have reviewed the triage vital signs and the nursing notes.   HISTORY  Chief Complaint Weakness    HPI Monique Burns is a 85 y.o. female with a history of CAD status post MI, CABG 4 years ago, CHF, HTN, HL, presenting with chest pressure, shortness of breath, diaphoresis and lightheadedness.  The patient reports that she was eating soup when she began to feel a central chest pressure that did not radiate, associated with a diaphoretic feeling and nausea without vomiting.  She did have some lightheadedness, but this is not unusual for her.  The soup was not particularly hot, and she did not have any burning sensation or burping.  No syncope, palpitations.  She was seen by her cardiologist, Dr. Clayborn Bigness, yesterday, with some medication changes including discontinuing her Norvasc, and increasing her Lasix due to worsening lower extremity swelling.  When EMS came to see the patient, she was bradycardic in the 40s and was given atropine with improvement of her heart rate into the 50s and 60s and complete resolution of her symptoms.   Past Medical History:  Diagnosis Date  . Anemia   . Arthritis   . Blood dyscrasia    since being put on Brilinta after stent placement developing hematoma  . Cancer ([place])    basal cell left leg  . CHF (congestive heart failure) ([place])   . GERD (gastroesophageal reflux disease)   . Headache    h/o migraines  . Hyperlipidemia   . Hypertension   . Leukopenia   . MI (myocardial infarction) ([place]) 02/2015   s/p 4 stents and CABG in 2017  . Renal disorder    stage 3-Dr Caryl Comes keeping a check on kidney function  . Shortness of breath dyspnea    mild compared to prior stenting-pt states she would have to stop and rest if walking a mile  . Thyroid disease     Patient Active Problem List   Diagnosis Date  Noted  . Unstable angina ([place]) 12/20/2017  . Protein-calorie malnutrition, severe 11/27/2015  . Chest pain 11/26/2015  . Chronic kidney disease 04/05/2015  . HLD (hyperlipidemia) 04/05/2015  . BP (high blood pressure) 04/05/2015  . Decreased leukocytes 04/05/2015  . Arthritis, degenerative 04/05/2015  . Disease of thyroid gland 04/05/2015  . Acute myocardial infarction of anterior wall ([place]) 02/28/2015  . Degenerative arthritis of hip 10/31/2013    Past Surgical History:  Procedure Laterality Date  . ABDOMINAL HYSTERECTOMY    . CARDIAC CATHETERIZATION N/A 12/08/2015   Procedure: Left Heart Cath and Coronary Angiography;  Surgeon: Yolonda Kida, MD;  Location: [place] [facility];  Service: Cardiovascular;  Laterality: N/A;  . CORONARY ANGIOPLASTY WITH STENT PLACEMENT    . CORONARY STENT PLACEMENT  2016   x2  . EYE SURGERY Bilateral 2011  . INCISION AND DRAINAGE Left 08/26/2015   Procedure: INCISION AND DRAINAGE / HAND HEMATOMA;  Surgeon: Hessie Knows, MD;  Location: [facility] ORS;  Service: Orthopedics;  Laterality: Left;  . JOINT REPLACEMENT Bilateral    left 2016 and right 2015  . TONSILLECTOMY     3rd grade    Current Outpatient Rx  . Order #: [id number] Class: Historical Med  . Order #: [id number] Class: Normal  . Order #: [id number] Class: Historical Med  . Order #: [id number] Class: Historical Med  . Order #: [id number] Class: Historical Med  . Order #: [id number] Class: Historical Med  .  Order #: [id number] Class: Historical Med  . Order #: [id number] Class: Historical Med  . Order #: [id number] Class: Historical Med  . Order #: [id number] Class: Historical Med  . Order #: [id number] Class: Historical Med    Allergies Brilinta [ticagrelor] and Imdur [isosorbide nitrate]  Family History  Problem Relation Age of Onset  . Breast cancer Paternal Grandmother   . Brain cancer Mother   . Stroke Father   . Brain cancer Son     Social History Social History   Tobacco Use  . Smoking  status: Never Smoker  . Smokeless tobacco: Never Used  Substance Use Topics  . Alcohol use: No  . Drug use: No    Review of Systems Constitutional: No fever/chills.  No syncope.  Positive lightheadedness.  Positive diaphoresis. Eyes: No visual changes. ENT: No sore throat. No congestion or rhinorrhea. Cardiovascular: Central chest pressure. Denies palpitations. Respiratory: Positive shortness of breath.  No cough. Gastrointestinal: No abdominal pain.  Positive nausea, no vomiting.  No diarrhea.  No constipation. Genitourinary: Negative for dysuria. Musculoskeletal: Negative for back pain.  Positive improving bilateral lower extremity edema without calf pain. Skin: Negative for rash. Neurological: Negative for headaches. No focal numbness, tingling or weakness.     ____________________________________________   PHYSICAL EXAM:  VITAL SIGNS: ED Triage Vitals  Enc Vitals Group     BP 06/27/18 1403 (!) 130/57     Pulse Rate 06/27/18 1403 63     Resp 06/27/18 1403 17     Temp 06/27/18 1403 (!) 97.5 F (36.4 C)     Temp Source 06/27/18 1403 Oral     SpO2 06/27/18 1430 99 %     Weight 06/27/18 1405 114 lb (51.7 kg)     Height 06/27/18 1405 5\' 7"  (1.702 m)     Head Circumference --      Peak Flow --      Pain Score 06/27/18 1404 0     Pain Loc --      Pain Edu? --      Excl. in [place]? --     Constitutional: Alert and oriented.  Answers questions appropriately.  Chronically ill-appearing. Eyes: Conjunctivae are normal.  EOMI. No scleral icterus. Head: Atraumatic. Nose: No congestion/rhinnorhea. Mouth/Throat: Mucous membranes are moist.  Neck: No stridor.  Supple.  No JVD.  No meningismus. Cardiovascular: Normal rate, regular rhythm. No murmurs, rubs or gallops.  Respiratory: Normal respiratory effort.  No accessory muscle use or retractions. Lungs CTAB.  No wheezes, rales or ronchi. Gastrointestinal: Soft, nontender and nondistended.  No guarding or rebound.  No peritoneal  signs. Musculoskeletal: Bilateral symmetric minimal pitting LE edema around the ankles only. No ttp in the calves or palpable cords.  Negative Homan's sign. Neurologic:  A&Ox3.  Speech is clear.  Face and smile are symmetric.  EOMI.  Moves all extremities well. Skin:  Skin is warm, dry and intact. No rash noted. Psychiatric: Mood and affect are normal. Speech and behavior are normal.  Normal judgement.  ____________________________________________   LABS (all labs ordered are listed, but only abnormal results are displayed)  Labs Reviewed  BASIC METABOLIC PANEL - Abnormal; Notable for the following components:      Result Value   Glucose, Bld 117 (*)    BUN 35 (*)    Creatinine, Ser 1.57 (*)    GFR calc non Af Amer 30 (*)    GFR calc Af Amer 34 (*)    All other components within normal limits  CBC - Abnormal; Notable  for the following components:   RBC 3.10 (*)    Hemoglobin 9.8 (*)    HCT 30.8 (*)    All other components within normal limits  TROPONIN I  TROPONIN I  BRAIN NATRIURETIC PEPTIDE   ____________________________________________  EKG  ED ECG REPORT I, Anne-Caroline Mariea Clonts, the attending physician, personally viewed and interpreted this ECG.   Date: 06/27/2018  EKG Time: 1402  Rate: 63  Rhythm: normal sinus rhythm  Axis: leftward  Intervals:none  ST&T Change: No STEMI; 1 mm of ST elevation in V2.  Poor baseline tracing; EKG will be repeated.   Repeat EKG: ED ECG REPORT I, Anne-Caroline Mariea Clonts, the attending physician, personally viewed and interpreted this ECG.   Date: 06/27/2018  EKG Time: 1509  Rate: 55  Rhythm: sinus bradycardia  Axis: leftward  Intervals:first-degree A-V block   ST&T Change: No STEMI; 83mm ST elevation isolated to V2 and V3 w/o reciprocal changes.  EKG is compared to 12/2017, with similar ST changes and overall similar morphology. ____________________________________________  RADIOLOGY  Dg Chest 2 View  Result Date:  06/27/2018 CLINICAL DATA:  Chest pressure, nausea and weakness while eating lunch today. EXAM: CHEST - 2 VIEW COMPARISON:  12/20/2017. FINDINGS: Normal sized heart. Clear lungs. Stable mild prominence of the interstitial markings and mild hyperexpansion of the lungs. Stable post CABG changes and coronary artery stent. Diffuse osteopenia. IMPRESSION: No acute abnormality. Stable mild changes of COPD. Electronically Signed   By: Claudie Revering M.D.   On: 06/27/2018 14:36    ____________________________________________   PROCEDURES  Procedure(s) performed: No  Procedures  Critical Care performed: No ____________________________________________   INITIAL IMPRESSION / ASSESSMENT AND PLAN / ED COURSE  Pertinent labs & imaging results that were available during my care of the patient were reviewed by me and considered in my medical decision making (see chart for details).  85 y.o. female with a history of CAD, CHF, presenting with an episode of chest pressure, shortness of breath, lightheadedness while eating soup, now resolved.  The patient was found to be bradycardic and here does have some bradycardia.  She is on metoprolol but did not have any changes in her dose.  Her symptoms may be related to symptomatic bradycardia, but she does have significant cardiac risk factors.  Her EKG is reassuring without ischemic changes or arrhythmia, and her first troponin is negative.  A second troponin is pending.  I will plan to speak with her cardiologist for consultation.  It is possible that her Lasix caused her to be hypovolemic since this dose was increased, and I will get orthostatics.  I will hold off on giving her any fluids at this time because if she is not orthostatic, the reason she is taking the Lasix is due to edema and this may worsen given her history of CHF.  Plan reevaluation for final disposition.  ----------------------------------------- 3:45 PM on  06/27/2018 -----------------------------------------  Have spoken with Dr. Clayborn Bigness, who feels comfortable managing the patient's symptoms as an outpatient if her second troponin is negative and she continues to feel well.  We will plan to decrease her metoprolol dosing to 12.5 mg once daily at night, and she will skip her evening dose tonight and start this new regimen tomorrow.  She will not start her new blood pressure medication until she is reevaluated by Dr. Clayborn Bigness.  ____________________________________________  FINAL CLINICAL IMPRESSION(S) / ED DIAGNOSES  Final diagnoses:  Chest pressure  Sinus bradycardia  Lightheadedness  Nausea without vomiting  Shortness of  breath         NEW MEDICATIONS STARTED DURING THIS VISIT:  New Prescriptions   No medications on file      Eula Listen, MD 06/27/18 1546

## 2018-06-27 NOTE — ED Notes (Signed)
Pt states "I just have chest pressure and I feel tired". Denies SOB/dizziness. NAD noted at this tim.

## 2018-06-27 NOTE — ED Triage Notes (Signed)
Pt arrived via [facility]. Per EMS pt was having lunch at [employer] she were she started to feel weak, mid chest pressure; HR 48 on scene. EMS admx 0.5 of atropine in route. Upon arrival pt states "I have had 4 bypass surgery in the pass". Pt presents to ED with 64HR. Denies SOB, dizziness and c/o "chest pressure" mid chest.

## 2018-07-23 DIAGNOSIS — G43719 Chronic migraine without aura, intractable, without status migrainosus: Secondary | ICD-10-CM | POA: Insufficient documentation

## 2018-09-27 ENCOUNTER — Emergency Department: Payer: Medicare Other

## 2018-09-27 ENCOUNTER — Other Ambulatory Visit: Payer: Self-pay

## 2018-09-27 ENCOUNTER — Encounter: Payer: Self-pay | Admitting: Emergency Medicine

## 2018-09-27 ENCOUNTER — Emergency Department
Admission: EM | Admit: 2018-09-27 | Discharge: 2018-09-27 | Disposition: A | Discharge status: Home / Self Care | Payer: Medicare Other | Attending: Student in an Organized Health Care Education/Training Program | Admitting: Student in an Organized Health Care Education/Training Program

## 2018-09-27 DIAGNOSIS — W1789XA Other fall from one level to another, initial encounter: Secondary | ICD-10-CM | POA: Insufficient documentation

## 2018-09-27 DIAGNOSIS — Z955 Presence of coronary angioplasty implant and graft: Secondary | ICD-10-CM | POA: Insufficient documentation

## 2018-09-27 DIAGNOSIS — N183 Chronic kidney disease, stage 3 (moderate): Secondary | ICD-10-CM | POA: Diagnosis not present

## 2018-09-27 DIAGNOSIS — Z7982 Long term (current) use of aspirin: Secondary | ICD-10-CM | POA: Diagnosis not present

## 2018-09-27 DIAGNOSIS — Z1159 Encounter for screening for other viral diseases: Secondary | ICD-10-CM | POA: Insufficient documentation

## 2018-09-27 DIAGNOSIS — Y939 Activity, unspecified: Secondary | ICD-10-CM | POA: Diagnosis not present

## 2018-09-27 DIAGNOSIS — Z79899 Other long term (current) drug therapy: Secondary | ICD-10-CM | POA: Insufficient documentation

## 2018-09-27 DIAGNOSIS — W19XXXA Unspecified fall, initial encounter: Secondary | ICD-10-CM

## 2018-09-27 DIAGNOSIS — S0990XA Unspecified injury of head, initial encounter: Secondary | ICD-10-CM | POA: Diagnosis present

## 2018-09-27 DIAGNOSIS — I252 Old myocardial infarction: Secondary | ICD-10-CM | POA: Diagnosis not present

## 2018-09-27 DIAGNOSIS — Y92096 Garden or yard of other non-institutional residence as the place of occurrence of the external cause: Secondary | ICD-10-CM | POA: Insufficient documentation

## 2018-09-27 DIAGNOSIS — I129 Hypertensive chronic kidney disease with stage 1 through stage 4 chronic kidney disease, or unspecified chronic kidney disease: Secondary | ICD-10-CM | POA: Insufficient documentation

## 2018-09-27 DIAGNOSIS — S0003XA Contusion of scalp, initial encounter: Secondary | ICD-10-CM | POA: Insufficient documentation

## 2018-09-27 DIAGNOSIS — Z85828 Personal history of other malignant neoplasm of skin: Secondary | ICD-10-CM | POA: Insufficient documentation

## 2018-09-27 DIAGNOSIS — M25552 Pain in left hip: Secondary | ICD-10-CM | POA: Diagnosis not present

## 2018-09-27 DIAGNOSIS — Y999 Unspecified external cause status: Secondary | ICD-10-CM | POA: Insufficient documentation

## 2018-09-27 LAB — CBC WITH DIFFERENTIAL/PLATELET
Abs Immature Granulocytes: 0.06 10*3/uL (ref 0.00–0.07)
Basophils Absolute: 0 10*3/uL (ref 0.0–0.1)
Basophils Relative: 0 %
Eosinophils Absolute: 0.1 10*3/uL (ref 0.0–0.5)
Eosinophils Relative: 1 %
HCT: 28.9 % — ABNORMAL LOW (ref 36.0–46.0)
Hemoglobin: 9.4 g/dL — ABNORMAL LOW (ref 12.0–15.0)
Immature Granulocytes: 1 %
Lymphocytes Relative: 10 %
Lymphs Abs: 0.8 10*3/uL (ref 0.7–4.0)
MCH: 31.1 pg (ref 26.0–34.0)
MCHC: 32.5 g/dL (ref 30.0–36.0)
MCV: 95.7 fL (ref 80.0–100.0)
Monocytes Absolute: 0.7 10*3/uL (ref 0.1–1.0)
Monocytes Relative: 9 %
Neutro Abs: 6.1 10*3/uL (ref 1.7–7.7)
Neutrophils Relative %: 79 %
Platelets: 259 10*3/uL (ref 150–400)
RBC: 3.02 MIL/uL — ABNORMAL LOW (ref 3.87–5.11)
RDW: 12.8 % (ref 11.5–15.5)
WBC: 7.8 10*3/uL (ref 4.0–10.5)
nRBC: 0 % (ref 0.0–0.2)

## 2018-09-27 LAB — COMPREHENSIVE METABOLIC PANEL
ALT: 22 U/L (ref 0–44)
AST: 33 U/L (ref 15–41)
Albumin: 4.1 g/dL (ref 3.5–5.0)
Alkaline Phosphatase: 79 U/L (ref 38–126)
Anion gap: 10 (ref 5–15)
BUN: 35 mg/dL — ABNORMAL HIGH (ref 8–23)
CO2: 22 mmol/L (ref 22–32)
Calcium: 9.1 mg/dL (ref 8.9–10.3)
Chloride: 102 mmol/L (ref 98–111)
Creatinine, Ser: 1.42 mg/dL — ABNORMAL HIGH (ref 0.44–1.00)
GFR calc Af Amer: 39 mL/min — ABNORMAL LOW (ref >60)
GFR calc non Af Amer: 33 mL/min — ABNORMAL LOW (ref >60)
Glucose, Bld: 123 mg/dL — ABNORMAL HIGH (ref 70–99)
Potassium: 4.5 mmol/L (ref 3.5–5.1)
Sodium: 134 mmol/L — ABNORMAL LOW (ref 135–145)
Total Bilirubin: 0.9 mg/dL (ref 0.3–1.2)
Total Protein: 7.2 g/dL (ref 6.5–8.1)

## 2018-09-27 LAB — SARS CORONAVIRUS 2 BY RT PCR (HOSPITAL ORDER, PERFORMED IN ~~LOC~~ HOSPITAL LAB): SARS Coronavirus 2: NEGATIVE

## 2018-09-27 MED ORDER — FENTANYL CITRATE (PF) 100 MCG/2ML IJ SOLN
12.5000 ug | INTRAMUSCULAR | Status: DC | PRN
  Filled 2018-09-27: qty 2

## 2018-09-27 MED ORDER — ACETAMINOPHEN 500 MG PO TABS
1000.0000 mg | ORAL_TABLET | Freq: Once | ORAL | Status: AC
  Administered 2018-09-27: 19:00:00 1000 mg via ORAL
  Filled 2018-09-27: qty 2

## 2018-09-27 NOTE — Discharge Instructions (Addendum)
Please follow-up with PCP.  Tylenol for pain.  Return to the ER if you have any worsening symptoms questions or concerns.

## 2018-09-27 NOTE — ED Triage Notes (Addendum)
States fell backward while picking flowers and hit head on concrete.   C/O head pain and left hip pain.  Denies LOC.  No swelling or bleeding seen to head.  States also c/o left foot pain from a stumble one week ago.  AAOx3.  Skin warm and dry. NAD

## 2018-09-27 NOTE — ED Notes (Signed)
Patient transported to CT 

## 2018-09-27 NOTE — ED Provider Notes (Signed)
[facility] Emergency Department Provider Note    First MD Initiated Contact with Patient 09/27/18 1731     (approximate)  I have reviewed the triage vital signs and the nursing notes.   HISTORY  Chief Complaint Fall    HPI Monique Burns is a 85 y.o. female presents the ER for evaluation of acute left hip pain.  Patient was working on her garden picking flowers fell onto her left hip hitting the side of her head.  Did not have any loss of consciousness.  Denies any chest pain or shortness of breath.  Pain is worsened with movement.  Did have to call for assistance as she was initially unable to ambulate due to pain.    Past Medical History:  Diagnosis Date  . Anemia   . Arthritis   . Blood dyscrasia    since being put on Brilinta after stent placement developing hematoma  . Cancer ([place])    basal cell left leg  . CHF (congestive heart failure) ([place])   . GERD (gastroesophageal reflux disease)   . Headache    h/o migraines  . Hyperlipidemia   . Hypertension   . Leukopenia   . MI (myocardial infarction) ([place]) 02/2015   s/p 4 stents and CABG in 2017  . Renal disorder    stage 3-Dr Caryl Comes keeping a check on kidney function  . Shortness of breath dyspnea    mild compared to prior stenting-pt states she would have to stop and rest if walking a mile  . Thyroid disease    Family History  Problem Relation Age of Onset  . Breast cancer Paternal Grandmother   . Brain cancer Mother   . Stroke Father   . Brain cancer Son    Past Surgical History:  Procedure Laterality Date  . ABDOMINAL HYSTERECTOMY    . CARDIAC CATHETERIZATION N/A 12/08/2015   Procedure: Left Heart Cath and Coronary Angiography;  Surgeon: Yolonda Kida, MD;  Location: [place] [facility];  Service: Cardiovascular;  Laterality: N/A;  . CORONARY ANGIOPLASTY WITH STENT PLACEMENT    . CORONARY STENT PLACEMENT  2016   x2  . EYE SURGERY Bilateral 2011  . INCISION AND DRAINAGE Left  08/26/2015   Procedure: INCISION AND DRAINAGE / HAND HEMATOMA;  Surgeon: Hessie Knows, MD;  Location: [facility] ORS;  Service: Orthopedics;  Laterality: Left;  . JOINT REPLACEMENT Bilateral    left 2016 and right 2015  . TONSILLECTOMY     3rd grade   Patient Active Problem List   Diagnosis Date Noted  . Unstable angina ([place]) 12/20/2017  . Protein-calorie malnutrition, severe 11/27/2015  . Chest pain 11/26/2015  . Chronic kidney disease 04/05/2015  . HLD (hyperlipidemia) 04/05/2015  . BP (high blood pressure) 04/05/2015  . Decreased leukocytes 04/05/2015  . Arthritis, degenerative 04/05/2015  . Disease of thyroid gland 04/05/2015  . Acute myocardial infarction of anterior wall ([place]) 02/28/2015  . Degenerative arthritis of hip 10/31/2013      Prior to Admission medications   Medication Sig Start Date End Date Taking? Authorizing Provider  acetaminophen (TYLENOL) 500 MG tablet Take 500 mg by mouth every 6 (six) hours as needed for pain.    [provider]  amLODipine (NORVASC) 2.5 MG tablet Take 1 tablet (2.5 mg total) by mouth daily. 12/21/17 12/21/18  Gladstone Lighter, MD  aspirin EC 81 MG tablet Take 81 mg by mouth daily.    [provider]  atorvastatin (LIPITOR) 80 MG tablet  Take 80 mg by mouth every evening.     [provider]  cycloSPORINE (RESTASIS) 0.05 % ophthalmic emulsion Place 1 drop into both eyes 2 (two) times daily.  11/17/13   [provider]  DULoxetine (CYMBALTA) 30 MG capsule Take 30 mg by mouth daily.     [provider]  metoprolol tartrate (LOPRESSOR) 25 MG tablet Take 12.5 mg by mouth every 12 (twelve) hours. 06/28/16   [provider]  nitroGLYCERIN (NITROSTAT) 0.4 MG SL tablet Place 0.4 mg under the tongue every 5 (five) minutes x 3 doses as needed. 05/02/16   [provider]  ONE TOUCH ULTRA TEST test strip USE AS DIRECTED DAILY DX:E16.2 12/28/17   [provider]  ranolazine (RANEXA) 1000 MG SR  tablet Take 1,000 mg by mouth 2 (two) times daily. 11/30/17   [provider]  tiZANidine (ZANAFLEX) 2 MG tablet Take 2 mg by mouth at bedtime as needed. pain 12/12/17   [provider]    Allergies Brilinta [ticagrelor] and Imdur [isosorbide nitrate]    Social History Social History   Tobacco Use  . Smoking status: Never Smoker  . Smokeless tobacco: Never Used  Substance Use Topics  . Alcohol use: No  . Drug use: No    Review of Systems Patient denies headaches, rhinorrhea, blurry vision, numbness, shortness of breath, chest pain, edema, cough, abdominal pain, nausea, vomiting, diarrhea, dysuria, fevers, rashes or hallucinations unless otherwise stated above in HPI. ____________________________________________   PHYSICAL EXAM:  VITAL SIGNS: Vitals:   09/27/18 1900 09/27/18 1930  BP: (!) 149/48 (!) 147/53  Pulse: (!) 58 (!) 59  Resp: 16 16  Temp:    SpO2: 93% 94%    Constitutional: Alert and oriented.  Eyes: Conjunctivae are normal.  Head: left posterior scalp contusion, small 1cm abrasion Nose: No congestion/rhinnorhea. Mouth/Throat: Mucous membranes are moist.   Neck: No stridor. Painless ROM.  Cardiovascular: Normal rate, regular rhythm. Grossly normal heart sounds.  Good peripheral circulation. Respiratory: Normal respiratory effort.  No retractions. Lungs CTAB. Gastrointestinal: Soft and nontender. No distention. No abdominal bruits. No CVA tenderness. Genitourinary:  Musculoskeletal: pain with palpation of left hip, also with brusing to right leg.  NV intact distally.  No deformity.  No joint effusions. Neurologic:  Normal speech and language. No gross focal neurologic deficits are appreciated. No facial droop Skin:  Skin is warm, dry and intact. No rash noted. Psychiatric: Mood and affect are normal. Speech and behavior are normal.  ____________________________________________   LABS (all labs ordered are listed, but only abnormal results  are displayed)  Results for orders placed or performed during the hospital encounter of 09/27/18 (from the past 24 hour(s))  SARS Coronavirus 2 (CEPHEID - Performed in [facility] hospital lab), Hosp Order     Status: None   Collection Time: 09/27/18  6:02 PM  Result Value Ref Range   SARS Coronavirus 2 NEGATIVE NEGATIVE  CBC with Differential/Platelet     Status: Abnormal   Collection Time: 09/27/18  6:02 PM  Result Value Ref Range   WBC 7.8 4.0 - 10.5 K/uL   RBC 3.02 (L) 3.87 - 5.11 MIL/uL   Hemoglobin 9.4 (L) 12.0 - 15.0 g/dL   HCT 28.9 (L) 36.0 - 46.0 %   MCV 95.7 80.0 - 100.0 fL   MCH 31.1 26.0 - 34.0 pg   MCHC 32.5 30.0 - 36.0 g/dL   RDW 12.8 11.5 - 15.5 %   Platelets 259 150 - 400 K/uL  nRBC 0.0 0.0 - 0.2 %   Neutrophils Relative % 79 %   Neutro Abs 6.1 1.7 - 7.7 K/uL   Lymphocytes Relative 10 %   Lymphs Abs 0.8 0.7 - 4.0 K/uL   Monocytes Relative 9 %   Monocytes Absolute 0.7 0.1 - 1.0 K/uL   Eosinophils Relative 1 %   Eosinophils Absolute 0.1 0.0 - 0.5 K/uL   Basophils Relative 0 %   Basophils Absolute 0.0 0.0 - 0.1 K/uL   Immature Granulocytes 1 %   Abs Immature Granulocytes 0.06 0.00 - 0.07 K/uL  Comprehensive metabolic panel     Status: Abnormal   Collection Time: 09/27/18  6:02 PM  Result Value Ref Range   Sodium 134 (L) 135 - 145 mmol/L   Potassium 4.5 3.5 - 5.1 mmol/L   Chloride 102 98 - 111 mmol/L   CO2 22 22 - 32 mmol/L   Glucose, Bld 123 (H) 70 - 99 mg/dL   BUN 35 (H) 8 - 23 mg/dL   Creatinine, Ser 1.42 (H) 0.44 - 1.00 mg/dL   Calcium 9.1 8.9 - 10.3 mg/dL   Total Protein 7.2 6.5 - 8.1 g/dL   Albumin 4.1 3.5 - 5.0 g/dL   AST 33 15 - 41 U/L   ALT 22 0 - 44 U/L   Alkaline Phosphatase 79 38 - 126 U/L   Total Bilirubin 0.9 0.3 - 1.2 mg/dL   GFR calc non Af Amer 33 (L) >60 mL/min   GFR calc Af Amer 39 (L) >60 mL/min   Anion gap 10 5 - 15   ____________________________________________  EKG My review and personal interpretation at Time: 17:11    Indication: dizziness  Rate: 75  Rhythm: sinus Axis: normal  Other: normal intervals, no stemi ____________________________________________  RADIOLOGY  I personally reviewed all radiographic images ordered to evaluate for the above acute complaints and reviewed radiology reports and findings.  These findings were personally discussed with the patient.  Please see medical record for radiology report.  ____________________________________________   PROCEDURES  Procedure(s) performed:  Procedures    Critical Care performed: no ____________________________________________   INITIAL IMPRESSION / ASSESSMENT AND PLAN / ED COURSE  Pertinent labs & imaging results that were available during my care of the patient were reviewed by me and considered in my medical decision making (see chart for details).   DDX: fracture, contusion, dislocation, sdh, sah, concussion  CHAMILLE WERNTZ is a 85 y.o. who presents to the ED with symptoms as described above.  Patient afebrile hemodynamically stable no acute distress.  Exam as above.  Patient declining any pain medication other than Tylenol at this time.  Will order radiographs blood work and CT imaging for the above differential.  Clinical Course as of Sep 27 2007  Fri Sep 27, 2018  1912 Ct imaging with [facility]    [PR]  1940 Radiographs are reassuring.  Patient requesting discharge home.  Will ambulate to ensure the patient is able to stand without any discomfort and there is no evidence of or findings to suggest occult fracture.   [PR]  2005 Patient able to ambulate and bear weight.  She is somewhat sore from where she fell and has some bruises but compartments are soft and with reassuring exam.  She is requesting discharged home.  Patient stable and appropriate for discharge home.   [PR]    Clinical Course User Index [PR] Merlyn Lot, MD    The patient was evaluated in Emergency Department today for the  symptoms described in the  history of present illness. He/she was evaluated in the context of the global COVID-19 pandemic, which necessitated consideration that the patient might be at risk for infection with the SARS-CoV-2 virus that causes COVID-19. Institutional protocols and algorithms that pertain to the evaluation of patients at risk for COVID-19 are in a state of rapid change based on information released by regulatory bodies including the CDC and federal and state organizations. These policies and algorithms were followed during the patient's care in the ED.  As part of my medical decision making, I reviewed the following data within the [place] notes reviewed and incorporated, Labs reviewed, notes from prior ED visits and [place] Controlled Substance Database   ____________________________________________   FINAL CLINICAL IMPRESSION(S) / ED DIAGNOSES  Final diagnoses:  Fall, initial encounter  Left hip pain  Contusion of scalp, initial encounter      NEW MEDICATIONS STARTED DURING THIS VISIT:  New Prescriptions   No medications on file     Note:  This document was prepared using Dragon voice recognition software and may include unintentional dictation errors.    Merlyn Lot, MD 09/27/18 2009

## 2018-09-27 NOTE — ED Notes (Signed)
Pt moved to room 26 via stretcher

## 2018-09-27 NOTE — ED Notes (Addendum)
See triage note  Presents s/p fall  States she was picking flowers and fell  Having pain to right knee and lower leg  Also having increased pain to left hip area  Unable to bear wt  States she is able to stand on right leg

## 2018-09-27 NOTE — ED Triage Notes (Signed)
Arrives via [facility] from home s/p fall.  Per EMS patient was bending over picking flowers, fell forward.  C/O left hip pain.  Patient was weight bearing at home.  Patient AAOx3.  Skin warm and dry. NAD

## 2019-01-01 DIAGNOSIS — E538 Deficiency of other specified B group vitamins: Secondary | ICD-10-CM | POA: Insufficient documentation

## 2019-09-16 ENCOUNTER — Ambulatory Visit: Payer: Medicare Other | Attending: Internal Medicine

## 2019-09-16 DIAGNOSIS — Z23 Encounter for immunization: Secondary | ICD-10-CM

## 2019-09-16 NOTE — Progress Notes (Signed)
   Covid-19 Vaccination Clinic  Name:  Monique Burns    MRN: [id number] DOB: 20-Nov-1930  09/16/2019  [name] was observed post Covid-19 immunization for 15 minutes without incident. She was provided with Vaccine Information Sheet and instruction to access the V-Safe system.   [name] was instructed to call 911 with any severe reactions post vaccine: Marland Kitchen Difficulty breathing  . Swelling of face and throat  . A fast heartbeat  . A bad rash all over body  . Dizziness and weakness   Immunizations Administered    Name Date Dose VIS Date Route   Pfizer COVID-19 Vaccine 09/16/2019  4:10 PM 0.3 mL 07/02/2018 Intramuscular   Manufacturer: [place]   Lot: [id number]   [place]: [id number]

## 2019-09-26 ENCOUNTER — Other Ambulatory Visit: Payer: Self-pay

## 2019-09-26 ENCOUNTER — Emergency Department: Payer: Medicare Other

## 2019-09-26 ENCOUNTER — Encounter: Payer: Self-pay | Admitting: Emergency Medicine

## 2019-09-26 ENCOUNTER — Emergency Department
Admission: EM | Admit: 2019-09-26 | Discharge: 2019-09-27 | Disposition: A | Discharge status: Home / Self Care | Payer: Medicare Other | Attending: Emergency Medicine | Admitting: Emergency Medicine

## 2019-09-26 DIAGNOSIS — Z85828 Personal history of other malignant neoplasm of skin: Secondary | ICD-10-CM | POA: Diagnosis not present

## 2019-09-26 DIAGNOSIS — S60212A Contusion of left wrist, initial encounter: Secondary | ICD-10-CM | POA: Diagnosis not present

## 2019-09-26 DIAGNOSIS — Z951 Presence of aortocoronary bypass graft: Secondary | ICD-10-CM | POA: Insufficient documentation

## 2019-09-26 DIAGNOSIS — Y92002 Bathroom of unspecified non-institutional (private) residence single-family (private) house as the place of occurrence of the external cause: Secondary | ICD-10-CM | POA: Insufficient documentation

## 2019-09-26 DIAGNOSIS — W19XXXA Unspecified fall, initial encounter: Secondary | ICD-10-CM

## 2019-09-26 DIAGNOSIS — Z79899 Other long term (current) drug therapy: Secondary | ICD-10-CM | POA: Diagnosis not present

## 2019-09-26 DIAGNOSIS — I609 Nontraumatic subarachnoid hemorrhage, unspecified: Secondary | ICD-10-CM

## 2019-09-26 DIAGNOSIS — S0993XA Unspecified injury of face, initial encounter: Secondary | ICD-10-CM | POA: Diagnosis present

## 2019-09-26 DIAGNOSIS — Z955 Presence of coronary angioplasty implant and graft: Secondary | ICD-10-CM | POA: Diagnosis not present

## 2019-09-26 DIAGNOSIS — N189 Chronic kidney disease, unspecified: Secondary | ICD-10-CM | POA: Diagnosis not present

## 2019-09-26 DIAGNOSIS — Y9389 Activity, other specified: Secondary | ICD-10-CM | POA: Diagnosis not present

## 2019-09-26 DIAGNOSIS — M25512 Pain in left shoulder: Secondary | ICD-10-CM | POA: Insufficient documentation

## 2019-09-26 DIAGNOSIS — I509 Heart failure, unspecified: Secondary | ICD-10-CM | POA: Diagnosis not present

## 2019-09-26 DIAGNOSIS — S01112A Laceration without foreign body of left eyelid and periocular area, initial encounter: Secondary | ICD-10-CM | POA: Diagnosis not present

## 2019-09-26 DIAGNOSIS — Z23 Encounter for immunization: Secondary | ICD-10-CM | POA: Insufficient documentation

## 2019-09-26 DIAGNOSIS — Z7982 Long term (current) use of aspirin: Secondary | ICD-10-CM | POA: Diagnosis not present

## 2019-09-26 DIAGNOSIS — S0181XA Laceration without foreign body of other part of head, initial encounter: Secondary | ICD-10-CM

## 2019-09-26 DIAGNOSIS — R519 Headache, unspecified: Secondary | ICD-10-CM | POA: Diagnosis not present

## 2019-09-26 DIAGNOSIS — I130 Hypertensive heart and chronic kidney disease with heart failure and stage 1 through stage 4 chronic kidney disease, or unspecified chronic kidney disease: Secondary | ICD-10-CM | POA: Insufficient documentation

## 2019-09-26 DIAGNOSIS — R0789 Other chest pain: Secondary | ICD-10-CM | POA: Diagnosis not present

## 2019-09-26 DIAGNOSIS — Z9660 Presence of unspecified orthopedic joint implant: Secondary | ICD-10-CM | POA: Insufficient documentation

## 2019-09-26 DIAGNOSIS — Y999 Unspecified external cause status: Secondary | ICD-10-CM | POA: Diagnosis not present

## 2019-09-26 DIAGNOSIS — R55 Syncope and collapse: Secondary | ICD-10-CM | POA: Diagnosis not present

## 2019-09-26 LAB — COMPREHENSIVE METABOLIC PANEL
ALT: 22 U/L (ref 0–44)
AST: 29 U/L (ref 15–41)
Albumin: 4.1 g/dL (ref 3.5–5.0)
Alkaline Phosphatase: 49 U/L (ref 38–126)
Anion gap: 8 (ref 5–15)
BUN: 33 mg/dL — ABNORMAL HIGH (ref 8–23)
CO2: 23 mmol/L (ref 22–32)
Calcium: 9.4 mg/dL (ref 8.9–10.3)
Chloride: 104 mmol/L (ref 98–111)
Creatinine, Ser: 1.46 mg/dL — ABNORMAL HIGH (ref 0.44–1.00)
GFR calc Af Amer: 37 mL/min — ABNORMAL LOW (ref >60)
GFR calc non Af Amer: 32 mL/min — ABNORMAL LOW (ref >60)
Glucose, Bld: 104 mg/dL — ABNORMAL HIGH (ref 70–99)
Potassium: 4.5 mmol/L (ref 3.5–5.1)
Sodium: 135 mmol/L (ref 135–145)
Total Bilirubin: 0.9 mg/dL (ref 0.3–1.2)
Total Protein: 7 g/dL (ref 6.5–8.1)

## 2019-09-26 LAB — CBC WITH DIFFERENTIAL/PLATELET
Abs Immature Granulocytes: 0.02 10*3/uL (ref 0.00–0.07)
Basophils Absolute: 0 10*3/uL (ref 0.0–0.1)
Basophils Relative: 1 %
Eosinophils Absolute: 0 10*3/uL (ref 0.0–0.5)
Eosinophils Relative: 1 %
HCT: 25.6 % — ABNORMAL LOW (ref 36.0–46.0)
Hemoglobin: 8.3 g/dL — ABNORMAL LOW (ref 12.0–15.0)
Immature Granulocytes: 0 %
Lymphocytes Relative: 18 %
Lymphs Abs: 0.9 10*3/uL (ref 0.7–4.0)
MCH: 32.4 pg (ref 26.0–34.0)
MCHC: 32.4 g/dL (ref 30.0–36.0)
MCV: 100 fL (ref 80.0–100.0)
Monocytes Absolute: 0.6 10*3/uL (ref 0.1–1.0)
Monocytes Relative: 11 %
Neutro Abs: 3.7 10*3/uL (ref 1.7–7.7)
Neutrophils Relative %: 69 %
Platelets: 256 10*3/uL (ref 150–400)
RBC: 2.56 MIL/uL — ABNORMAL LOW (ref 3.87–5.11)
RDW: 13 % (ref 11.5–15.5)
WBC: 5.3 10*3/uL (ref 4.0–10.5)
nRBC: 0 % (ref 0.0–0.2)

## 2019-09-26 LAB — TROPONIN I (HIGH SENSITIVITY)
Troponin I (High Sensitivity): 9 ng/L (ref <18)
Troponin I (High Sensitivity): 15 ng/L (ref <18)

## 2019-09-26 MED ORDER — TETANUS-DIPHTH-ACELL PERTUSSIS 5-2.5-18.5 LF-MCG/0.5 IM SUSP
0.5000 mL | Freq: Once | INTRAMUSCULAR | Status: AC
  Administered 2019-09-26: 0.5 mL via INTRAMUSCULAR
  Filled 2019-09-26: qty 0.5

## 2019-09-26 MED ORDER — AMLODIPINE BESYLATE 5 MG PO TABS
2.5000 mg | ORAL_TABLET | Freq: Once | ORAL | Status: AC
  Administered 2019-09-26: 2.5 mg via ORAL
  Filled 2019-09-26: qty 1

## 2019-09-26 MED ORDER — LIDOCAINE HCL (PF) 1 % IJ SOLN
5.0000 mL | Freq: Once | INTRAMUSCULAR | Status: AC
  Administered 2019-09-26: 5 mL
  Filled 2019-09-26: qty 5

## 2019-09-26 MED ORDER — LIDOCAINE HCL (PF) 1 % IJ SOLN
INTRAMUSCULAR | Status: AC
  Filled 2019-09-26: qty 5

## 2019-09-26 MED ORDER — ACETAMINOPHEN 500 MG PO TABS
1000.0000 mg | ORAL_TABLET | Freq: Once | ORAL | Status: AC
  Administered 2019-09-26: 1000 mg via ORAL
  Filled 2019-09-26: qty 2

## 2019-09-26 NOTE — ED Notes (Signed)
Report given to Stephen RN.

## 2019-09-26 NOTE — ED Notes (Signed)
Pure wick in place 

## 2019-09-26 NOTE — ED Triage Notes (Signed)
Pt presents to ED via [facility] with c/o fall with laceration to L temporal area. Per EMS pt normally walks with walker and did not have walker in bathroom with her. Pt with significant laceration to L temporal area, bleeding controlled upon arrival to ED. Per EMS pt denies memory of falling, woke up on the floor. Pt with recently L shoulder break that is incompletely healed at this time. Pt A&O x 4 upon arrival to ED.  165 systol CBG 104 HR 65 98% RA

## 2019-09-26 NOTE — ED Notes (Signed)
Patient is alert and oriented. Patient was given a warm blanket for comfort. Husband left, but will come back after eating and taking his home meds.

## 2019-09-26 NOTE — ED Provider Notes (Signed)
[facility] Emergency Department Provider Note  ____________________________________________   First MD Initiated Contact with Monique Burns 09/26/19 1842     (approximate)  I have reviewed the triage vital signs and the nursing notes.   HISTORY  Chief Complaint Loss of Consciousness and Fall    HPI Monique Burns is a 86 y.o. female CAD status post MI, CABG 4 years ago, CHF, HTN, HL who comes in for fall.  Monique Burns was in the bathroom.  Monique Burns walker was not in the bathroom and Monique Burns supposed to be using a walker.  Monique Burns has a clavicle fracture that is known to be worked on with orthopedics.  Monique Burns states that Monique Burns does not remember exactly what happened but woke up on the ground.  Monique Burns had laceration to Monique Burns left eyebrow.  Monique Burns is having pain at that area that is mild, constant, nothing makes better, nothing makes it worse.  Monique Burns also reports some pain to Monique Burns left shoulder but states that Monique Burns is unsure that Monique Burns is from Monique Burns prior clavicle fracture.  Monique Burns does report LOC.  Monique Burns is able to move Monique Burns legs well.  Denies any chest wall or abdominal tenderness.  Monique Burns does report a little bit of chest pressure as well.          Past Medical History:  Diagnosis Date  . Anemia   . Arthritis   . Blood dyscrasia    since being put on Brilinta after stent placement developing hematoma  . Cancer ([place])    basal cell left leg  . CHF (congestive heart failure) ([place])   . GERD (gastroesophageal reflux disease)   . Headache    h/o migraines  . Hyperlipidemia   . Hypertension   . Leukopenia   . MI (myocardial infarction) ([place]) 02/2015   s/p 4 stents and CABG in 2017  . Renal disorder    stage 3-Dr Caryl Comes keeping a check on kidney function  . Shortness of breath dyspnea    mild compared to prior stenting-pt states Monique Burns would have to stop and rest if walking a mile  . Thyroid disease     Monique Burns Active Problem List   Diagnosis Date Noted  . Unstable angina ([place])  12/20/2017  . Protein-calorie malnutrition, severe 11/27/2015  . Chest pain 11/26/2015  . Chronic kidney disease 04/05/2015  . HLD (hyperlipidemia) 04/05/2015  . BP (high blood pressure) 04/05/2015  . Decreased leukocytes 04/05/2015  . Arthritis, degenerative 04/05/2015  . Disease of thyroid gland 04/05/2015  . Acute myocardial infarction of anterior wall ([place]) 02/28/2015  . Degenerative arthritis of hip 10/31/2013    Past Surgical History:  Procedure Laterality Date  . ABDOMINAL HYSTERECTOMY    . CARDIAC CATHETERIZATION N/A 12/08/2015   Procedure: Left Heart Cath and Coronary Angiography;  Surgeon: Yolonda Kida, MD;  Location: [facility];  Service: Cardiovascular;  Laterality: N/A;  . CORONARY ANGIOPLASTY WITH STENT PLACEMENT    . CORONARY STENT PLACEMENT  2016   x2  . EYE SURGERY Bilateral 2011  . INCISION AND DRAINAGE Left 08/26/2015   Procedure: INCISION AND DRAINAGE / HAND HEMATOMA;  Surgeon: Hessie Knows, MD;  Location: [facility];  Service: Orthopedics;  Laterality: Left;  . JOINT REPLACEMENT Bilateral    left 2016 and right 2015  . TONSILLECTOMY     3rd grade    Prior to Admission medications   Medication Sig Start Date End Date Taking? Authorizing Provider  acetaminophen (TYLENOL) 500 MG tablet Take 500  mg by mouth every 6 (six) hours as needed for pain.    [provider]  amLODipine (NORVASC) 2.5 MG tablet Take 1 tablet (2.5 mg total) by mouth daily. 12/21/17 12/21/18  Gladstone Lighter, MD  aspirin EC 81 MG tablet Take 81 mg by mouth daily.    [provider]  atorvastatin (LIPITOR) 80 MG tablet Take 80 mg by mouth every evening.     [provider]  cycloSPORINE (RESTASIS) 0.05 % ophthalmic emulsion Place 1 drop into both eyes 2 (two) times daily.  11/17/13   [provider]  DULoxetine (CYMBALTA) 30 MG capsule Take 30 mg by mouth daily.     [provider]  metoprolol tartrate (LOPRESSOR) 25 MG tablet Take 12.5  mg by mouth every 12 (twelve) hours. 06/28/16   [provider]  nitroGLYCERIN (NITROSTAT) 0.4 MG SL tablet Place 0.4 mg under the tongue every 5 (five) minutes x 3 doses as needed. 05/02/16   [provider]  ONE TOUCH ULTRA TEST test strip USE AS DIRECTED DAILY DX:E16.2 12/28/17   [provider]  ranolazine (RANEXA) 1000 MG SR tablet Take 1,000 mg by mouth 2 (two) times daily. 11/30/17   [provider]  tiZANidine (ZANAFLEX) 2 MG tablet Take 2 mg by mouth at bedtime as needed. pain 12/12/17   [provider]    Allergies Brilinta [ticagrelor] and Imdur [isosorbide nitrate]  Family History  Problem Relation Age of Onset  . Breast cancer Paternal Grandmother   . Brain cancer Mother   . Stroke Father   . Brain cancer Son     Social History Social History   Tobacco Use  . Smoking status: Never Smoker  . Smokeless tobacco: Never Used  Substance Use Topics  . Alcohol use: No  . Drug use: No      Review of Systems Constitutional: No fever/chills, fall Eyes: No visual changes.  Laceration to the left of the left eyebrow ENT: No sore throat. Cardiovascular: Positive chest pressure, mild Respiratory: Denies shortness of breath. Gastrointestinal: No abdominal pain.  No nausea, no vomiting.  No diarrhea.  No constipation. Genitourinary: Negative for dysuria. Musculoskeletal: Negative for back pain.  Positive left shoulder pain Skin: Negative for rash. Neurological: Positive headache  No focal weakness or numbness.  Positive LOC All other ROS negative ____________________________________________   PHYSICAL EXAM:  VITAL SIGNS: ED Triage Vitals  Enc Vitals Group     BP --      Pulse --      Resp --      Temp --      Temp src --      SpO2 --      Weight 09/26/19 1845 113 lb 15.7 oz (51.7 kg)     Height 09/26/19 1845 5\' 7"  (1.702 m)     Head Circumference --      Peak Flow --      Pain Score 09/26/19 1847 6     Pain Loc --       Pain Edu? --      Excl. in [place]? --     Constitutional: Alert and oriented. Well appearing and in no acute distress. Eyes: Conjunctivae are normal. EOMI. laceration noted to the left side of Monique Burns eyebrow about 8 cm Head: Laceration noted to the face Nose: No congestion/rhinnorhea. Mouth/Throat: Mucous membranes are moist.   Neck: No stridor. Trachea Midline. FROM.  No C-spine tenderness Cardiovascular: Normal rate, regular rhythm. Grossly normal heart sounds.  Good peripheral circulation.  No chest wall tenderness Respiratory: Normal respiratory effort.  No retractions. Lungs CTAB. Gastrointestinal: Soft and nontender. No distention. No abdominal bruits.  Musculoskeletal: Tenderness to the left shoulder, bruising on the left wrist.  No joint effusions.  No other tenderness noted on the extremities.  Able to lift both legs up off the bed. Neurologic:  Normal speech and language. No gross focal neurologic deficits are appreciated.  Skin:  Skin is warm, dry and intact. No rash noted. Psychiatric: Mood and affect are normal. Speech and behavior are normal. GU: Deferred   ____________________________________________   LABS (all labs ordered are listed, but only abnormal results are displayed)  Labs Reviewed  CBC WITH DIFFERENTIAL/PLATELET - Abnormal; Notable for the following components:      Result Value   RBC 2.56 (*)    Hemoglobin 8.3 (*)    HCT 25.6 (*)    All other components within normal limits  COMPREHENSIVE METABOLIC PANEL - Abnormal; Notable for the following components:   Glucose, Bld 104 (*)    BUN 33 (*)    Creatinine, Ser 1.46 (*)    GFR calc non Af Amer 32 (*)    GFR calc Af Amer 37 (*)    All other components within normal limits  URINE CULTURE  URINALYSIS, COMPLETE (UACMP) WITH MICROSCOPIC  TROPONIN I (HIGH SENSITIVITY)  TROPONIN I (HIGH SENSITIVITY)   ____________________________________________   ED ECG REPORT I, Vanessa [place], the attending physician,  personally viewed and interpreted this ECG.  Sinus rate of 60, no ST elevation, T wave inversion in aVL, normal intervals.  T wave inversions similar  ____________________________________________  RADIOLOGY Robert Bellow, personally viewed and evaluated these images (plain radiographs) as part of my medical decision making, as well as reviewing the written report by the radiologist.  ED MD interpretation: X-ray shows old left clavicle fracture  Official radiology report(s): DG Wrist Complete Left  Result Date: 09/26/2019 CLINICAL DATA:  Pain status post fall EXAM: LEFT WRIST - COMPLETE 3+ VIEW COMPARISON:  None. FINDINGS: There is no evidence of fracture or dislocation. There is no evidence of arthropathy or other focal bone abnormality. Soft tissues are unremarkable. IMPRESSION: Negative. Electronically Signed   By: Constance Holster M.D.   On: 09/26/2019 19:09   CT Head Wo Contrast  Result Date: 09/26/2019 CLINICAL DATA:  [place], left temporal laceration EXAM: CT HEAD WITHOUT CONTRAST TECHNIQUE: Contiguous axial images were obtained from the base of the skull through the vertex without intravenous contrast. COMPARISON:  09/27/2018 FINDINGS: Brain: There are small focal areas of subarachnoid hemorrhage along the left temporal lobe images 10 and 11, in the left parietooccipital region image 19, and within the right frontal region image 17. No mass effect. Chronic ischemic changes in the periventricular white matter, greatest in the occipital regions. Lateral ventricles and midline structures are unremarkable. No extra-axial fluid collections or mass effect. Vascular: No hyperdense vessel or unexpected calcification. Skull: Normal. Negative for fracture or focal lesion. Sinuses/Orbits: No acute finding. Other: None. IMPRESSION: 1. Multiple small bilateral areas of subarachnoid hemorrhage without significant mass effect. These results were called by telephone at the time of interpretation on  09/26/2019 at 8:30 pm to provider [facility] , who verbally acknowledged these results. Electronically Signed   By: Randa Ngo M.D.   On: 09/26/2019 20:30   CT Cervical Spine Wo Contrast  Result Date: 09/26/2019 CLINICAL DATA:  [place], hit left temporal area EXAM: CT CERVICAL SPINE WITHOUT CONTRAST  TECHNIQUE: Multidetector CT imaging of the cervical spine was performed without intravenous contrast. Multiplanar CT image reconstructions were also generated. COMPARISON:  None. FINDINGS: Alignment: Alignment is anatomic. Skull base and vertebrae: No acute displaced fractures. Soft tissues and spinal canal: No prevertebral fluid or swelling. No visible canal hematoma. Disc levels: There is bony fusion across the left C2/C3 facet. Facet hypertrophic changes are seen at C3-4 and C4-5. There is mild diffuse disc space narrowing, most pronounced at C5-6 and C6-7. Upper chest: Airway is patent. Emphysematous changes at the lung apices. Other: Reconstructed images demonstrate no additional findings. IMPRESSION: 1. No acute cervical spine fracture. 2. Multilevel cervical spondylosis and facet hypertrophy. Electronically Signed   By: Randa Ngo M.D.   On: 09/26/2019 20:32   DG Shoulder Left  Result Date: 09/26/2019 CLINICAL DATA:  Pain EXAM: LEFT SHOULDER - 2+ VIEW COMPARISON:  Chest x-ray dated 06/27/2018 FINDINGS: There is an age-indeterminate fracture of the distal aspect of the left clavicle. This is new since 2020. There is no glenohumeral dislocation. Mild degenerative changes are noted of the glenohumeral joint and left AC joint. IMPRESSION: 1. Age-indeterminate fracture of the distal aspect of the left clavicle, new since 2020. 2. No glenohumeral dislocation. 3. Mild degenerative changes of the left shoulder. Electronically Signed   By: Constance Holster M.D.   On: 09/26/2019 19:10   DG Humerus Left  Result Date: 09/26/2019 CLINICAL DATA:  Pain EXAM: LEFT HUMERUS - 2+ VIEW COMPARISON:  None. FINDINGS:  There is no acute acute displaced fracture of the left humerus. No glenohumeral dislocation. There is an age-indeterminate fracture of the distal aspect of the left clavicle. IMPRESSION: 1. No acute displaced fracture of the left humerus. No glenohumeral dislocation. 2. Age-indeterminate fracture of the distal aspect of the left clavicle. Electronically Signed   By: Constance Holster M.D.   On: 09/26/2019 19:11   CT Maxillofacial Wo Contrast  Result Date: 09/26/2019 CLINICAL DATA:  Facial trauma, hit left temporal region, laceration EXAM: CT MAXILLOFACIAL WITHOUT CONTRAST TECHNIQUE: Multidetector CT imaging of the maxillofacial structures was performed. Multiplanar CT image reconstructions were also generated. COMPARISON:  None. FINDINGS: Osseous: No acute displaced fractures. Orbits: Negative. No traumatic or inflammatory finding. Sinuses: Clear. Soft tissues: Small laceration left frontal scalp. Otherwise soft tissues are unremarkable. Limited intracranial: No significant or unexpected finding. Please refer to head CT report describing scattered small subarachnoid hemorrhages. IMPRESSION: 1. No acute facial bone fractures. 2. Please refer to head CT report describing areas of intracranial hemorrhage. Electronically Signed   By: Randa Ngo M.D.   On: 09/26/2019 20:34    ____________________________________________   PROCEDURES  Procedure(s) performed (including Critical Care):  Marland KitchenMarland KitchenLaceration Repair  Date/Time: 09/26/2019 8:06 PM Performed by: Vanessa [place], MD Authorized by: Vanessa [place], MD   Consent:    Consent obtained:  Verbal   Consent given by:  Monique Burns   Risks discussed:  Infection, need for additional repair, nerve damage, poor wound healing, poor cosmetic result, pain, retained foreign body, tendon damage and vascular damage   Alternatives discussed:  No treatment Anesthesia (see MAR for exact dosages):    Anesthesia method:  Local infiltration   Local anesthetic:  Lidocaine  1% w/o epi Laceration details:    Location:  Face   Face location:  Forehead   Length (cm):  8   Depth (mm):  3 Repair type:    Repair type:  Simple Exploration:    Hemostasis achieved with:  Direct pressure   Wound  exploration: entire depth of wound probed and visualized     Wound extent: no fascia violation noted, no muscle damage noted, no nerve damage noted, no tendon damage noted and no underlying fracture noted     Contaminated: no   Treatment:    Area cleansed with:  Saline   Amount of cleaning:  Standard Skin repair:    Repair method:  Sutures   Suture size:  6-0   Suture material:  Nylon   Suture technique:  Simple interrupted   Number of sutures:  6 Approximation:    Approximation:  Close Post-procedure details:    Monique Burns tolerance of procedure:  Tolerated well, no immediate complications     ____________________________________________   INITIAL IMPRESSION / ASSESSMENT AND PLAN / ED COURSE  TONIQUE MENDONCA was evaluated in Emergency Department on 09/26/2019 for the symptoms described in the history of present illness. Monique Burns was evaluated in the context of the global COVID-19 pandemic, which necessitated consideration that the Monique Burns might be at risk for infection with the SARS-CoV-2 virus that causes COVID-19. Institutional protocols and algorithms that pertain to the evaluation of patients at risk for COVID-19 are in a state of rapid change based on information released by regulatory bodies including the CDC and federal and state organizations. These policies and algorithms were followed during the Monique Burns's care in the ED.    Monique Burns comes in for possible syncopal versus mechanical fall.  Will get CT head evaluate for intracranial hemorrhage, CT cervical evaluate for cervical fracture, CT face evaluate for facial fracture.  Will get labs to evaluate for [place] abnormalities, EKG to evaluate for ACS given Monique Burns does report a little bit of chest  pressure  Hemoglobin is around baseline 8.3 with a prior 9.4  Creatinine at 1.46 which is similar to Monique Burns baseline  Troponin 9  Discussed with Monique Burns my concern that Monique Burns might of had syncope and that caused Monique Burns to pass out versus Monique Burns having mechanical fall.  Discussed that if we thought it was syncope that I would recommend admission for echocardiogram, Holter monitor and cardiac monitoring.  Monique Burns reports that Monique Burns just had an is a echocardiogram done within the past few months as well as Holter monitor.  Monique Burns understands the risk related to leaving but Monique Burns is not interested in being admitted for this, work-up.  Monique Burns husband is also at bedside who also agrees that he does not want Monique Burns to be admitted solely for this.  Monique Burns states that Monique Burns is already 86 years old and much older than one of Monique Burns Monique Burns friends and Monique Burns understands the risk of going that this could have been related to Monique Burns heart and Monique Burns could pass out and die in the future.  We discussed waiting to see what the CT scan shows and then we can go from there decide disposition  Small bilateral areas of subarachnoid noted on CT imaging.  CT cervical and CT face were negative.  X-rays show the clavicle fracture but that appears to be at baseline for Monique Burns.  Discussed with Monique Burns that we have no neurosurgery on-call today.  Monique Burns states that Monique Burns gets a lot of Monique Burns care with [facility] so Monique Burns would prefer [place] to talk to [facility] neurosurgery.  Monique Burns out to oncoming team pending [facility] neurosurgery's recommendations and repeat troponin.  ____________________________________________   FINAL CLINICAL IMPRESSION(S) / ED DIAGNOSES   Final diagnoses:  Subarachnoid bleed ([place])  Fall, initial encounter  Laceration of forehead, initial encounter  MEDICATIONS GIVEN DURING THIS VISIT:  Medications  Tdap (BOOSTRIX) injection 0.5 mL (has no administration in time range)  lidocaine (PF) (XYLOCAINE) 1 % injection 5 mL (has no administration in  time range)  lidocaine (PF) (XYLOCAINE) 1 % injection (has no administration in time range)     ED Discharge Orders    None       Note:  This document was prepared using Dragon voice recognition software and may include unintentional dictation errors.   Vanessa Byron, MD 09/27/19 1723

## 2019-09-26 NOTE — ED Provider Notes (Signed)
Patient received in signout from Dr. Nickolas Madrid.  Plan was awaiting callback from neuro at [facility].  Spoke with Dr. Radene Knee in consultation regarding the patient's presentation.  She has reviewed the images.  As the patient is currently neuro intact given the size of them does not feel transfer indicated that she is only on aspirin.  Has recommended 6-hour repeat CT head to make sure that it is stable.  They are unchanged will be appropriate for outpatient follow-up.   Merlyn Lot, MD 09/26/19 2115

## 2019-09-27 ENCOUNTER — Emergency Department: Payer: Medicare Other

## 2019-09-27 DIAGNOSIS — S01112A Laceration without foreign body of left eyelid and periocular area, initial encounter: Secondary | ICD-10-CM | POA: Diagnosis not present

## 2019-09-27 MED ORDER — TRAMADOL HCL 50 MG PO TABS
50.0000 mg | ORAL_TABLET | Freq: Once | ORAL | Status: AC
  Administered 2019-09-27: 50 mg via ORAL
  Filled 2019-09-27: qty 1

## 2019-09-27 NOTE — Discharge Instructions (Signed)
The small areas of bleeding seen on your CT scan are not increasing.  They should resolve on their own.  You should return in approximately 5 days to have the sutures removed.  You may also be able to do this through your primary care doctor.  Return to the emergency department immediately for new, worsening, or persistent severe headache, vomiting, dizziness, vision changes, seizure, strokelike symptoms, or any other new or worsening symptoms that concern you.

## 2019-09-27 NOTE — ED Notes (Addendum)
Pt transported to CT ?

## 2019-09-27 NOTE — ED Notes (Signed)
Pt readjusted in bed, purewik checked and depends dry.  Patient given pain meds, warm blanket, lights turned down, and denies other needs.

## 2019-09-27 NOTE — ED Provider Notes (Signed)
-----------------------------------------   4:39 AM on 09/27/2019 -----------------------------------------  I took over care of this patient from the prior physician.  Repeat CT after 6 hours shows no interval change in the [facility] seen on the prior scan.  On reassessment, the patient is alert and comfortable appearing.  She denies any new symptoms.  She states that she feels comfortable going home.  She is stable for discharge at this time.  I counseled her on the results of the imaging.  I gave her thorough return precautions and she expressed understanding.   Arta Silence, MD 09/27/19 [phone]

## 2019-09-30 ENCOUNTER — Other Ambulatory Visit: Payer: Self-pay | Admitting: Family Medicine

## 2019-09-30 DIAGNOSIS — I609 Nontraumatic subarachnoid hemorrhage, unspecified: Secondary | ICD-10-CM

## 2019-10-02 ENCOUNTER — Other Ambulatory Visit: Payer: Self-pay

## 2019-10-02 ENCOUNTER — Ambulatory Visit
Admission: RE | Admit: 2019-10-02 | Discharge: 2019-10-02 | Disposition: A | Discharge status: Home / Self Care | Payer: Medicare Other | Source: Ambulatory Visit | Attending: Family Medicine | Admitting: Family Medicine

## 2019-10-02 DIAGNOSIS — I609 Nontraumatic subarachnoid hemorrhage, unspecified: Secondary | ICD-10-CM | POA: Diagnosis present

## 2019-10-07 ENCOUNTER — Ambulatory Visit: Payer: Medicare Other | Attending: Internal Medicine

## 2019-10-07 DIAGNOSIS — Z23 Encounter for immunization: Secondary | ICD-10-CM

## 2019-10-07 NOTE — Progress Notes (Signed)
   Covid-19 Vaccination Clinic  Name:  Monique Burns    MRN: [id number] DOB: 1931-04-29  10/07/2019  [name] was observed post Covid-19 immunization for 30 minutes based on pre-vaccination screening without incident. She was provided with Vaccine Information Sheet and instruction to access the V-Safe system.   [name] was instructed to call 911 with any severe reactions post vaccine: Marland Kitchen Difficulty breathing  . Swelling of face and throat  . A fast heartbeat  . A bad rash all over body  . Dizziness and weakness   Immunizations Administered    Name Date Dose VIS Date Route   Pfizer COVID-19 Vaccine 10/07/2019  3:00 PM 0.3 mL 07/02/2018 Intramuscular   Manufacturer: [employer]   Lot: [id number]   [place]: [address]-3

## 2019-12-30 ENCOUNTER — Other Ambulatory Visit: Payer: Self-pay | Admitting: Internal Medicine

## 2019-12-30 DIAGNOSIS — W19XXXA Unspecified fall, initial encounter: Secondary | ICD-10-CM

## 2019-12-30 DIAGNOSIS — I680 Cerebral amyloid angiopathy: Secondary | ICD-10-CM

## 2019-12-30 DIAGNOSIS — E854 Organ-limited amyloidosis: Secondary | ICD-10-CM

## 2019-12-30 DIAGNOSIS — Y92009 Unspecified place in unspecified non-institutional (private) residence as the place of occurrence of the external cause: Secondary | ICD-10-CM

## 2019-12-30 DIAGNOSIS — R410 Disorientation, unspecified: Secondary | ICD-10-CM

## 2019-12-31 ENCOUNTER — Ambulatory Visit
Admission: RE | Admit: 2019-12-31 | Discharge: 2019-12-31 | Disposition: A | Discharge status: Home / Self Care | Payer: Medicare Other | Source: Ambulatory Visit | Attending: Internal Medicine | Admitting: Internal Medicine

## 2019-12-31 ENCOUNTER — Other Ambulatory Visit: Payer: Self-pay

## 2019-12-31 DIAGNOSIS — E854 Organ-limited amyloidosis: Secondary | ICD-10-CM | POA: Diagnosis present

## 2019-12-31 DIAGNOSIS — R410 Disorientation, unspecified: Secondary | ICD-10-CM | POA: Diagnosis not present

## 2019-12-31 DIAGNOSIS — I680 Cerebral amyloid angiopathy: Secondary | ICD-10-CM | POA: Diagnosis not present

## 2019-12-31 DIAGNOSIS — Y92009 Unspecified place in unspecified non-institutional (private) residence as the place of occurrence of the external cause: Secondary | ICD-10-CM

## 2019-12-31 DIAGNOSIS — R519 Headache, unspecified: Secondary | ICD-10-CM | POA: Diagnosis not present

## 2019-12-31 DIAGNOSIS — W19XXXA Unspecified fall, initial encounter: Secondary | ICD-10-CM | POA: Diagnosis not present

## 2020-02-09 ENCOUNTER — Other Ambulatory Visit: Payer: Self-pay

## 2020-02-09 ENCOUNTER — Emergency Department
Admission: EM | Admit: 2020-02-09 | Discharge: 2020-02-09 | Disposition: A | Discharge status: Home / Self Care | Payer: Medicare Other | Attending: Emergency Medicine | Admitting: Emergency Medicine

## 2020-02-09 ENCOUNTER — Emergency Department: Payer: Medicare Other

## 2020-02-09 DIAGNOSIS — R0789 Other chest pain: Secondary | ICD-10-CM | POA: Insufficient documentation

## 2020-02-09 DIAGNOSIS — Z5321 Procedure and treatment not carried out due to patient leaving prior to being seen by health care provider: Secondary | ICD-10-CM | POA: Insufficient documentation

## 2020-02-09 LAB — CBC
HCT: 33.8 % — ABNORMAL LOW (ref 36.0–46.0)
Hemoglobin: 11.1 g/dL — ABNORMAL LOW (ref 12.0–15.0)
MCH: 32.6 pg (ref 26.0–34.0)
MCHC: 32.8 g/dL (ref 30.0–36.0)
MCV: 99.1 fL (ref 80.0–100.0)
Platelets: 203 10*3/uL (ref 150–400)
RBC: 3.41 MIL/uL — ABNORMAL LOW (ref 3.87–5.11)
RDW: 13.6 % (ref 11.5–15.5)
WBC: 4.2 10*3/uL (ref 4.0–10.5)
nRBC: 0 % (ref 0.0–0.2)

## 2020-02-09 LAB — TROPONIN I (HIGH SENSITIVITY): Troponin I (High Sensitivity): 8 ng/L (ref <18)

## 2020-02-09 NOTE — ED Triage Notes (Signed)
First Nurse Note:  C/O chest pain and pressure today since noon.

## 2020-02-09 NOTE — ED Triage Notes (Signed)
Pt presents to ED via [facility] with c/o CP and pressure since approx noon today. Pt states pain is pressure in nature, states is radiating down her L arm. Pt states hx of MI x2 and quadruple bypass. Pt states pain is intermittent at this time.

## 2020-04-08 ENCOUNTER — Ambulatory Visit (INDEPENDENT_AMBULATORY_CARE_PROVIDER_SITE_OTHER): Payer: Medicare Other | Admitting: Urology

## 2020-04-08 ENCOUNTER — Other Ambulatory Visit: Payer: Self-pay

## 2020-04-08 ENCOUNTER — Encounter: Payer: Self-pay | Admitting: Urology

## 2020-04-08 VITALS — BP 166/67 | HR 58 | Ht 67.0 in | Wt 112.0 lb

## 2020-04-08 DIAGNOSIS — R351 Nocturia: Secondary | ICD-10-CM | POA: Diagnosis not present

## 2020-04-08 DIAGNOSIS — R350 Frequency of micturition: Secondary | ICD-10-CM | POA: Diagnosis not present

## 2020-04-08 DIAGNOSIS — N3281 Overactive bladder: Secondary | ICD-10-CM | POA: Diagnosis not present

## 2020-04-08 DIAGNOSIS — N3941 Urge incontinence: Secondary | ICD-10-CM | POA: Diagnosis not present

## 2020-04-08 LAB — BLADDER SCAN AMB NON-IMAGING: Scan Result: 75

## 2020-04-08 NOTE — Progress Notes (Signed)
04/08/2020 2:03 PM   Monique Burns [birth year] [id number]  Referring provider: Adin Hector, MD [facility] [place],  [place] [address]  Chief Complaint  Patient presents with   Other    HPI: Monique Burns is an 86 y.o. female seen at request of Dr. Caryl Comes for evaluation of lower urinary tract symptoms.   Complains of worsening LUTS over the last 2 years  Most bothersome symptom is nocturia x5-8  Daytime frequency, urgency with urge incontinence  Wears diapers  Denies dysuria, gross hematuria  2 to 3 day history of right upper quadrant pain; severity mild  No prior urologic evaluation or treatment of her symptoms   PMH: Past Medical History:  Diagnosis Date   Anemia    Arthritis    Blood dyscrasia    since being put on Brilinta after stent placement developing hematoma   Cancer ([place])    basal cell left leg   CHF (congestive heart failure) (HCC)    GERD (gastroesophageal reflux disease)    Headache    h/o migraines   Hyperlipidemia    Hypertension    Leukopenia    MI (myocardial infarction) ([place]) 02/2015   s/p 4 stents and CABG in 2017   Renal disorder    stage 3-Dr Caryl Comes keeping a check on kidney function   Shortness of breath dyspnea    mild compared to prior stenting-pt states she would have to stop and rest if walking a mile   Thyroid disease     Surgical History: Past Surgical History:  Procedure Laterality Date   ABDOMINAL HYSTERECTOMY     CARDIAC CATHETERIZATION N/A 12/08/2015   Procedure: Left Heart Cath and Coronary Angiography;  Surgeon: Yolonda Kida, MD;  Location: [place] [facility];  Service: Cardiovascular;  Laterality: N/A;   CORONARY ANGIOPLASTY WITH STENT PLACEMENT     CORONARY STENT PLACEMENT  2016   x2   EYE SURGERY Bilateral 2011   INCISION AND DRAINAGE Left 08/26/2015   Procedure: INCISION AND DRAINAGE / HAND HEMATOMA;  Surgeon: Hessie Knows, MD;  Location: [facility] OR[facility];   Service: Orthopedics;  Laterality: Left;   JOINT REPLACEMENT Bilateral    left 2016 and right 2015   TONSILLECTOMY     3rd grade    Home Medications:  Allergies as of 04/08/2020      Reactions   Brilinta [ticagrelor] Other (See Comments)   "caused stent to fail"   Imdur [isosorbide Nitrate] Other (See Comments)   Headache      Medication List       Accurate as of April 08, 2020  2:03 PM. If you have any questions, ask your nurse or doctor.        acetaminophen 500 MG tablet Commonly known as: TYLENOL Take 500 mg by mouth every 6 (six) hours as needed for pain.   amLODipine 2.5 MG tablet Commonly known as: NORVASC Take 1 tablet (2.5 mg total) by mouth daily.   aspirin EC 81 MG tablet Take 81 mg by mouth daily.   atorvastatin 80 MG tablet Commonly known as: LIPITOR Take 80 mg by mouth every evening.   ciprofloxacin 250 MG tablet Commonly known as: CIPRO Take by mouth.   cloNIDine 0.1 MG tablet Commonly known as: CATAPRES Take 0.1 mg by mouth 2 (two) times daily.   DULoxetine 30 MG capsule Commonly known as: CYMBALTA Take 30 mg by mouth daily.   metoprolol tartrate 25 MG tablet Commonly known as:  LOPRESSOR Take 12.5 mg by mouth every 12 (twelve) hours.   nitroGLYCERIN 0.4 MG SL tablet Commonly known as: NITROSTAT Place 0.4 mg under the tongue every 5 (five) minutes x 3 doses as needed.   ONE TOUCH ULTRA TEST test strip Generic drug: glucose blood USE AS DIRECTED DAILY DX:E16.2   ranolazine 1000 MG SR tablet Commonly known as: RANEXA Take 1,000 mg by mouth 2 (two) times daily.   Restasis 0.05 % ophthalmic emulsion Generic drug: cycloSPORINE Place 1 drop into both eyes 2 (two) times daily.   tiZANidine 2 MG tablet Commonly known as: ZANAFLEX Take 2 mg by mouth at bedtime as needed. pain       Allergies:  Allergies  Allergen Reactions   Brilinta [Ticagrelor] Other (See Comments)    "caused stent to fail"   Imdur [Isosorbide Nitrate]  Other (See Comments)    Headache    Family History: Family History  Problem Relation Age of Onset   Breast cancer Paternal Grandmother    Brain cancer Mother    Stroke Father    Brain cancer Son     Social History:  reports that she has never smoked. She has never used smokeless tobacco. She reports that she does not drink alcohol and does not use drugs.   Physical Exam: BP (!) 166/67    Pulse (!) 58    Ht 5\' 7"  (1.702 m)    Wt 112 lb (50.8 kg)    BMI 17.54 kg/m   Constitutional:  Alert, No acute distress. HEENT: [place] AT, moist mucus membranes.  Trachea midline, no masses. Cardiovascular: No clubbing, cyanosis, or edema. Respiratory: Normal respiratory effort, no increased work of breathing. Skin: No rashes, bruises or suspicious lesions. Psychiatric: Normal mood and affect.   Assessment & Plan:    1.  Overactive bladder with urge incontinence  Discussed both idiopathic and neurogenic detrusor overactivity  Trial Myrbetriq 25 mg daily.  Given 4 weeks of samples; potential side effects of elevated blood pressure were discussed  PA follow-up 1 month to reassess symptoms  Could not void for urinalysis and bladder scan for estimated volume was 75 mL   2.  Nocturia  We discussed that nocturia is a difficult problem to manage due to multiple etiologies including nocturnal polyuria and sleep apnea  Hopefully the Myrbetriq will reduce the frequency of her episodes  Would not recommend DDAVP based on age  If nocturia not improved on follow-up consider at home Elliott, MD  [facility] [address], [place], [place] [address] [phone]

## 2020-05-10 ENCOUNTER — Ambulatory Visit (INDEPENDENT_AMBULATORY_CARE_PROVIDER_SITE_OTHER): Payer: Medicare Other | Admitting: Physician Assistant

## 2020-05-10 ENCOUNTER — Encounter: Payer: Self-pay | Admitting: Physician Assistant

## 2020-05-10 ENCOUNTER — Other Ambulatory Visit: Payer: Self-pay

## 2020-05-10 VITALS — BP 157/84 | HR 60 | Ht 67.0 in | Wt 115.0 lb

## 2020-05-10 DIAGNOSIS — N3281 Overactive bladder: Secondary | ICD-10-CM | POA: Diagnosis not present

## 2020-05-10 LAB — BLADDER SCAN AMB NON-IMAGING: Scan Result: 86

## 2020-05-10 MED ORDER — GEMTESA 75 MG PO TABS: 75.0000 mg | ORAL_TABLET | Freq: Every day | ORAL | 0 refills | Status: DC

## 2020-05-10 NOTE — Progress Notes (Signed)
05/10/2020 1:25 PM   Monique Burns 06/12/1930 [id number]  CC: Chief Complaint  Patient presents with  . Over Active Bladder   HPI: Monique Burns is a 87 y.o. female with PMH OAB-wet with nocturia who presents today for symptom recheck and PVR on a trial of Myrbetriq 25 mg daily.  She is accompanied today by her grandson, who contributes to HPI.  Today she reports no symptomatic improvement on Myrbetriq.  She reports stable nocturia x5-6, daytime frequency every hour, severe urgency, and urinary leakage.  She notes her urinary symptoms were worse over the past week associated with taking Lasix.  She has been restricting fluids after 4 PM.  PVR 86 mL, previously 75 mL.  PMH: Past Medical History:  Diagnosis Date  . Anemia   . Arthritis   . Blood dyscrasia    since being put on Brilinta after stent placement developing hematoma  . Cancer ([place])    basal cell left leg  . CHF (congestive heart failure) ([place])   . GERD (gastroesophageal reflux disease)   . Headache    h/o migraines  . Hyperlipidemia   . Hypertension   . Leukopenia   . MI (myocardial infarction) ([place]) 02/2015   s/p 4 stents and CABG in 2017  . Renal disorder    stage 3-Dr Caryl Comes keeping a check on kidney function  . Shortness of breath dyspnea    mild compared to prior stenting-pt states she would have to stop and rest if walking a mile  . Thyroid disease     Surgical History: Past Surgical History:  Procedure Laterality Date  . ABDOMINAL HYSTERECTOMY    . CARDIAC CATHETERIZATION N/A 12/08/2015   Procedure: Left Heart Cath and Coronary Angiography;  Surgeon: Yolonda Kida, MD;  Location: [place] [facility];  Service: Cardiovascular;  Laterality: N/A;  . CORONARY ANGIOPLASTY WITH STENT PLACEMENT    . CORONARY STENT PLACEMENT  2016   x2  . EYE SURGERY Bilateral 2011  . INCISION AND DRAINAGE Left 08/26/2015   Procedure: INCISION AND DRAINAGE / HAND HEMATOMA;  Surgeon: Hessie Knows, MD;  Location: [facility]  ORS;  Service: Orthopedics;  Laterality: Left;  . JOINT REPLACEMENT Bilateral    left 2016 and right 2015  . TONSILLECTOMY     3rd grade    Home Medications:  Allergies as of 05/10/2020      Reactions   Brilinta [ticagrelor] Other (See Comments)   "caused stent to fail"   Imdur [isosorbide Nitrate] Other (See Comments)   Headache      Medication List       Accurate as of May 10, 2020  1:25 PM. If you have any questions, ask your nurse or doctor.        acetaminophen 500 MG tablet Commonly known as: TYLENOL Take 500 mg by mouth every 6 (six) hours as needed for pain.   amLODipine 2.5 MG tablet Commonly known as: NORVASC Take 1 tablet (2.5 mg total) by mouth daily.   aspirin EC 81 MG tablet Take 81 mg by mouth daily.   atorvastatin 80 MG tablet Commonly known as: LIPITOR Take 80 mg by mouth every evening.   cloNIDine 0.1 MG tablet Commonly known as: CATAPRES Take 0.1 mg by mouth 2 (two) times daily.   cycloSPORINE 0.05 % ophthalmic emulsion Commonly known as: RESTASIS Place 1 drop into both eyes 2 (two) times daily.   DULoxetine 30 MG capsule Commonly known as: CYMBALTA Take 30 mg by mouth  daily.   metoprolol tartrate 25 MG tablet Commonly known as: LOPRESSOR Take 12.5 mg by mouth every 12 (twelve) hours.   nitroGLYCERIN 0.4 MG SL tablet Commonly known as: NITROSTAT Place 0.4 mg under the tongue every 5 (five) minutes x 3 doses as needed.   ONE TOUCH ULTRA TEST test strip Generic drug: glucose blood USE AS DIRECTED DAILY DX:E16.2   ranolazine 1000 MG SR tablet Commonly known as: RANEXA Take 1,000 mg by mouth 2 (two) times daily.   tiZANidine 2 MG tablet Commonly known as: ZANAFLEX Take 2 mg by mouth at bedtime as needed. pain       Allergies:  Allergies  Allergen Reactions  . Brilinta [Ticagrelor] Other (See Comments)    "caused stent to fail"  . Imdur [Isosorbide Nitrate] Other (See Comments)    Headache    Family History: Family  History  Problem Relation Age of Onset  . Breast cancer Paternal Grandmother   . Brain cancer Mother   . Stroke Father   . Brain cancer Son     Social History:   reports that she has never smoked. She has never used smokeless tobacco. She reports that she does not drink alcohol and does not use drugs.  Physical Exam: BP (!) 157/84   Pulse 60   Ht 5\' 7"  (1.702 m)   Wt 115 lb (52.2 kg)   BMI 18.01 kg/m   Constitutional:  Alert and oriented, no acute distress, nontoxic appearing HEENT: [place], AT Cardiovascular: No clubbing, cyanosis, or edema Respiratory: Normal respiratory effort, no increased work of breathing Skin: No rashes, bruises or suspicious lesions Neurologic: Grossly intact, no focal deficits, moving all 4 extremities Psychiatric: Normal mood and affect  Laboratory Data: Results for orders placed or performed in visit on 05/10/20  Bladder Scan (Post Void Residual) in office  Result Value Ref Range   Scan Result 86    Assessment & Plan:   1. Overactive bladder No therapeutic improvement on Myrbetriq 25 mg daily.  I had a lengthy conversation with the patient and her grandson today regarding her alternative therapy options at this point.  We discussed alternative pharmacotherapy including Gemtesa and anticholinergic medications as well as alternative treatments including PTNS, intravesical Botox, and InterStim.  Ultimately, patient wishes to try another pharmaceutical agent at this time.  Anticholinergics contraindicated due to her age.  Gemtesa x4 weeks provided to patient today, will plan for symptom recheck and PVR in 4 weeks.  Patient is in agreement with this plan. - Bladder Scan (Post Void Residual) in office - Vibegron (GEMTESA) 75 MG TABS; Take 75 mg by mouth daily.  Dispense: 28 tablet; Refill: 0   Return in about 4 weeks (around 06/07/2020) for Symptom recheck with PVR.  Monique Loop, PA-C  [facility] [address], [place], [place] [address] [phone]

## 2020-05-28 ENCOUNTER — Emergency Department: Payer: Medicare Other

## 2020-05-28 ENCOUNTER — Other Ambulatory Visit: Payer: Self-pay

## 2020-05-28 ENCOUNTER — Encounter: Payer: Self-pay | Admitting: Emergency Medicine

## 2020-05-28 ENCOUNTER — Inpatient Hospital Stay
Admission: EM | Admit: 2020-05-28 | Discharge: 2020-06-01 | DRG: 193 | Disposition: A | Discharge status: Skilled Nursing Facility | Payer: Medicare Other | Attending: Internal Medicine | Admitting: Internal Medicine

## 2020-05-28 DIAGNOSIS — Z7982 Long term (current) use of aspirin: Secondary | ICD-10-CM | POA: Diagnosis not present

## 2020-05-28 DIAGNOSIS — J181 Lobar pneumonia, unspecified organism: Secondary | ICD-10-CM | POA: Diagnosis present

## 2020-05-28 DIAGNOSIS — Z79899 Other long term (current) drug therapy: Secondary | ICD-10-CM

## 2020-05-28 DIAGNOSIS — Z955 Presence of coronary angioplasty implant and graft: Secondary | ICD-10-CM | POA: Diagnosis not present

## 2020-05-28 DIAGNOSIS — E785 Hyperlipidemia, unspecified: Secondary | ICD-10-CM | POA: Diagnosis present

## 2020-05-28 DIAGNOSIS — M1990 Unspecified osteoarthritis, unspecified site: Secondary | ICD-10-CM | POA: Diagnosis present

## 2020-05-28 DIAGNOSIS — D509 Iron deficiency anemia, unspecified: Secondary | ICD-10-CM | POA: Diagnosis present

## 2020-05-28 DIAGNOSIS — N1832 Chronic kidney disease, stage 3b: Secondary | ICD-10-CM | POA: Diagnosis present

## 2020-05-28 DIAGNOSIS — G9341 Metabolic encephalopathy: Secondary | ICD-10-CM | POA: Diagnosis present

## 2020-05-28 DIAGNOSIS — I10 Essential (primary) hypertension: Secondary | ICD-10-CM

## 2020-05-28 DIAGNOSIS — Z803 Family history of malignant neoplasm of breast: Secondary | ICD-10-CM

## 2020-05-28 DIAGNOSIS — I130 Hypertensive heart and chronic kidney disease with heart failure and stage 1 through stage 4 chronic kidney disease, or unspecified chronic kidney disease: Secondary | ICD-10-CM | POA: Diagnosis present

## 2020-05-28 DIAGNOSIS — Z951 Presence of aortocoronary bypass graft: Secondary | ICD-10-CM | POA: Diagnosis not present

## 2020-05-28 DIAGNOSIS — I700 Atherosclerosis of aorta: Secondary | ICD-10-CM | POA: Diagnosis present

## 2020-05-28 DIAGNOSIS — Z9660 Presence of unspecified orthopedic joint implant: Secondary | ICD-10-CM | POA: Diagnosis present

## 2020-05-28 DIAGNOSIS — Z20822 Contact with and (suspected) exposure to covid-19: Secondary | ICD-10-CM | POA: Diagnosis present

## 2020-05-28 DIAGNOSIS — Z888 Allergy status to other drugs, medicaments and biological substances status: Secondary | ICD-10-CM | POA: Diagnosis not present

## 2020-05-28 DIAGNOSIS — W010XXA Fall on same level from slipping, tripping and stumbling without subsequent striking against object, initial encounter: Secondary | ICD-10-CM | POA: Diagnosis present

## 2020-05-28 DIAGNOSIS — K219 Gastro-esophageal reflux disease without esophagitis: Secondary | ICD-10-CM | POA: Diagnosis present

## 2020-05-28 DIAGNOSIS — D649 Anemia, unspecified: Secondary | ICD-10-CM

## 2020-05-28 DIAGNOSIS — R296 Repeated falls: Secondary | ICD-10-CM | POA: Diagnosis present

## 2020-05-28 DIAGNOSIS — Y92013 Bedroom of single-family (private) house as the place of occurrence of the external cause: Secondary | ICD-10-CM | POA: Diagnosis not present

## 2020-05-28 DIAGNOSIS — I252 Old myocardial infarction: Secondary | ICD-10-CM

## 2020-05-28 DIAGNOSIS — J189 Pneumonia, unspecified organism: Secondary | ICD-10-CM | POA: Diagnosis present

## 2020-05-28 DIAGNOSIS — I2510 Atherosclerotic heart disease of native coronary artery without angina pectoris: Secondary | ICD-10-CM | POA: Diagnosis present

## 2020-05-28 DIAGNOSIS — I5032 Chronic diastolic (congestive) heart failure: Secondary | ICD-10-CM | POA: Diagnosis present

## 2020-05-28 DIAGNOSIS — M25552 Pain in left hip: Secondary | ICD-10-CM

## 2020-05-28 DIAGNOSIS — D500 Iron deficiency anemia secondary to blood loss (chronic): Secondary | ICD-10-CM | POA: Diagnosis not present

## 2020-05-28 DIAGNOSIS — Z808 Family history of malignant neoplasm of other organs or systems: Secondary | ICD-10-CM | POA: Diagnosis not present

## 2020-05-28 DIAGNOSIS — Z823 Family history of stroke: Secondary | ICD-10-CM | POA: Diagnosis not present

## 2020-05-28 DIAGNOSIS — S7002XA Contusion of left hip, initial encounter: Secondary | ICD-10-CM | POA: Diagnosis present

## 2020-05-28 DIAGNOSIS — R7989 Other specified abnormal findings of blood chemistry: Secondary | ICD-10-CM

## 2020-05-28 LAB — BASIC METABOLIC PANEL
Anion gap: 12 (ref 5–15)
BUN: 39 mg/dL — ABNORMAL HIGH (ref 8–23)
CO2: 19 mmol/L — ABNORMAL LOW (ref 22–32)
Calcium: 9.7 mg/dL (ref 8.9–10.3)
Chloride: 104 mmol/L (ref 98–111)
Creatinine, Ser: 1.38 mg/dL — ABNORMAL HIGH (ref 0.44–1.00)
GFR, Estimated: 37 mL/min — ABNORMAL LOW (ref >60)
Glucose, Bld: 128 mg/dL — ABNORMAL HIGH (ref 70–99)
Potassium: 4.5 mmol/L (ref 3.5–5.1)
Sodium: 135 mmol/L (ref 135–145)

## 2020-05-28 LAB — URINALYSIS, COMPLETE (UACMP) WITH MICROSCOPIC
Bacteria, UA: NONE SEEN
Bilirubin Urine: NEGATIVE
Glucose, UA: NEGATIVE mg/dL
Hgb urine dipstick: NEGATIVE
Ketones, ur: NEGATIVE mg/dL
Leukocytes,Ua: NEGATIVE
Nitrite: NEGATIVE
Protein, ur: 30 mg/dL — AB
Specific Gravity, Urine: 1.016 (ref 1.005–1.030)
pH: 5 (ref 5.0–8.0)

## 2020-05-28 LAB — BLOOD GAS, ARTERIAL
Acid-base deficit: 3.7 mmol/L — ABNORMAL HIGH (ref 0.0–2.0)
Bicarbonate: 20.3 mmol/L (ref 20.0–28.0)
FIO2: 0.28
O2 Saturation: 93.7 %
Patient temperature: 37
pCO2 arterial: 32 mmHg (ref 32.0–48.0)
pH, Arterial: 7.41 (ref 7.350–7.450)
pO2, Arterial: 69 mmHg — ABNORMAL LOW (ref 83.0–108.0)

## 2020-05-28 LAB — CBC
HCT: 33.4 % — ABNORMAL LOW (ref 36.0–46.0)
Hemoglobin: 10.9 g/dL — ABNORMAL LOW (ref 12.0–15.0)
MCH: 32.6 pg (ref 26.0–34.0)
MCHC: 32.6 g/dL (ref 30.0–36.0)
MCV: 100 fL (ref 80.0–100.0)
Platelets: 238 10*3/uL (ref 150–400)
RBC: 3.34 MIL/uL — ABNORMAL LOW (ref 3.87–5.11)
RDW: 12.4 % (ref 11.5–15.5)
WBC: 7.5 10*3/uL (ref 4.0–10.5)
nRBC: 0 % (ref 0.0–0.2)

## 2020-05-28 LAB — POC SARS CORONAVIRUS 2 AG -  ED: SARS Coronavirus 2 Ag: NEGATIVE

## 2020-05-28 LAB — LACTIC ACID, PLASMA: Lactic Acid, Venous: 1.4 mmol/L (ref 0.5–1.9)

## 2020-05-28 LAB — FIBRIN DERIVATIVES D-DIMER (ARMC ONLY): Fibrin derivatives D-dimer (ARMC): 3300.21 ng/mL (FEU) — ABNORMAL HIGH (ref 0.00–499.00)

## 2020-05-28 MED ORDER — NITROGLYCERIN 0.4 MG SL SUBL: 0.4000 mg | SUBLINGUAL_TABLET | SUBLINGUAL | Status: DC | PRN

## 2020-05-28 MED ORDER — FENTANYL CITRATE (PF) 100 MCG/2ML IJ SOLN
25.0000 ug | Freq: Once | INTRAMUSCULAR | Status: AC
  Administered 2020-05-28: 25 ug via INTRAVENOUS
  Filled 2020-05-28: qty 2

## 2020-05-28 MED ORDER — DULOXETINE HCL 30 MG PO CPEP
30.0000 mg | ORAL_CAPSULE | Freq: Every evening | ORAL | Status: DC
  Administered 2020-05-29 – 2020-05-31 (×4): 30 mg via ORAL
  Filled 2020-05-28 (×5): qty 1

## 2020-05-28 MED ORDER — ATORVASTATIN CALCIUM 20 MG PO TABS
80.0000 mg | ORAL_TABLET | Freq: Every evening | ORAL | Status: DC
  Administered 2020-05-29: 20 mg via ORAL
  Administered 2020-05-29 (×2): 80 mg via ORAL
  Filled 2020-05-28 (×2): qty 4

## 2020-05-28 MED ORDER — LAMOTRIGINE 25 MG PO TABS
25.0000 mg | ORAL_TABLET | Freq: Every evening | ORAL | Status: DC
Start: 2020-05-28 — End: 2020-06-01
  Administered 2020-05-29 – 2020-05-31 (×4): 25 mg via ORAL
  Filled 2020-05-28 (×4): qty 1

## 2020-05-28 MED ORDER — SODIUM CHLORIDE 0.9 % IV BOLUS
500.0000 mL | Freq: Once | INTRAVENOUS | Status: AC
  Administered 2020-05-28: 500 mL via INTRAVENOUS

## 2020-05-28 MED ORDER — RANOLAZINE ER 500 MG PO TB12
1000.0000 mg | ORAL_TABLET | Freq: Two times a day (BID) | ORAL | Status: DC
  Administered 2020-05-29 – 2020-05-31 (×6): 1000 mg via ORAL
  Filled 2020-05-28 (×7): qty 2

## 2020-05-28 MED ORDER — METOPROLOL SUCCINATE ER 25 MG PO TB24
25.0000 mg | ORAL_TABLET | Freq: Every evening | ORAL | Status: DC
  Administered 2020-05-29 – 2020-05-31 (×4): 25 mg via ORAL
  Filled 2020-05-28 (×4): qty 1

## 2020-05-28 MED ORDER — SODIUM CHLORIDE 0.9 % IV SOLN
500.0000 mg | INTRAVENOUS | Status: DC
  Administered 2020-05-28: 500 mg via INTRAVENOUS
  Filled 2020-05-28: qty 500

## 2020-05-28 MED ORDER — DIPHENHYDRAMINE HCL 50 MG/ML IJ SOLN
25.0000 mg | Freq: Once | INTRAMUSCULAR | Status: AC
  Administered 2020-05-28: 25 mg via INTRAVENOUS

## 2020-05-28 MED ORDER — LEVOFLOXACIN IN D5W 750 MG/150ML IV SOLN: 750.0000 mg | INTRAVENOUS | Status: DC

## 2020-05-28 MED ORDER — SODIUM CHLORIDE 0.9 % IV SOLN
2.0000 g | INTRAVENOUS | Status: DC
  Administered 2020-05-28: 2 g via INTRAVENOUS
  Filled 2020-05-28: qty 20

## 2020-05-28 MED ORDER — CLONIDINE HCL 0.1 MG PO TABS
0.1000 mg | ORAL_TABLET | Freq: Two times a day (BID) | ORAL | Status: DC | PRN
Start: 2020-05-28 — End: 2020-06-01

## 2020-05-28 MED ORDER — ENOXAPARIN SODIUM 30 MG/0.3ML ~~LOC~~ SOLN
30.0000 mg | SUBCUTANEOUS | Status: DC
  Administered 2020-05-29 – 2020-05-31 (×3): 30 mg via SUBCUTANEOUS
  Filled 2020-05-28 (×4): qty 0.3

## 2020-05-28 MED ORDER — ONDANSETRON HCL 4 MG/2ML IJ SOLN
4.0000 mg | Freq: Once | INTRAMUSCULAR | Status: AC
  Administered 2020-05-28: 4 mg via INTRAVENOUS
  Filled 2020-05-28: qty 2

## 2020-05-28 MED ORDER — VIBEGRON 75 MG PO TABS
75.0000 mg | ORAL_TABLET | Freq: Every day | ORAL | Status: DC
Start: 2020-05-28 — End: 2020-06-01

## 2020-05-28 MED ORDER — IOHEXOL 300 MG/ML  SOLN
75.0000 mL | Freq: Once | INTRAMUSCULAR | Status: AC | PRN
  Administered 2020-05-28: 75 mL via INTRAVENOUS
  Filled 2020-05-28: qty 75

## 2020-05-28 MED ORDER — SODIUM CHLORIDE 0.9 % IV SOLN: INTRAVENOUS | Status: DC

## 2020-05-28 MED ORDER — ACETAMINOPHEN 500 MG PO TABS
500.0000 mg | ORAL_TABLET | Freq: Four times a day (QID) | ORAL | Status: DC | PRN
  Administered 2020-05-28: 1000 mg via ORAL
  Administered 2020-05-29: 500 mg via ORAL
  Administered 2020-06-01: 1000 mg via ORAL
  Filled 2020-05-28: qty 1
  Filled 2020-05-28: qty 2

## 2020-05-28 MED ORDER — ACETAMINOPHEN 500 MG PO TABS
ORAL_TABLET | ORAL | Status: AC
  Filled 2020-05-28: qty 2

## 2020-05-28 MED ORDER — LEVOFLOXACIN IN D5W 750 MG/150ML IV SOLN
750.0000 mg | INTRAVENOUS | Status: DC
  Administered 2020-05-29: 750 mg via INTRAVENOUS
  Filled 2020-05-28 (×2): qty 150

## 2020-05-28 NOTE — ED Triage Notes (Addendum)
Pt comes into the Ed via EMS from home, fell sometime during the night, the grandson that lives with her found her on the floor by her bed this morning, pt has some confusion and is a poor historian. Pt arrived on 2L [place], she is not normally on O2 Pt reports she passed out this morning, pt has a hematoma to the right posterior head, states she is on blood thinners.  193/69 90%RA, on 2L [place] 97%

## 2020-05-28 NOTE — ED Notes (Signed)
Pt having possible reaction to rocephin. Pt "feels like she's drowning". Dr. Cherylann Banas at bedside. Placed on 2L [place].

## 2020-05-28 NOTE — ED Notes (Signed)
Did fall and landed btwn dresser and bed. Pain left foot with swelling and bruise.  They also say her left hip has been bothering for a week.  daugher says patient's right eye was swollen when she got here, but it is notnow.  No bruising or discoloration.

## 2020-05-28 NOTE — H&P (Signed)
History and Physical    BRITTEN PARADY CWC:[id number] DOB: 07-Feb-1931 DOA: 05/28/2020  PCP: Adin Hector, MD  Patient coming from: home  I have personally briefly reviewed patient's old medical records in [place]  Chief Complaint:  Fall , cough x 1 week   HPI: Monique DEDEAUX is a 87 y.o. 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 Patient states that for one week she had a cough and generally feeling somewhat unwell. She states last pm while attempting to get in bed she felt weak and fell. She noted that she his her head and had pain to left hip. She states she was unable to get up. She did not call out. She thought that if she rested she would be able to gather her strength and get in bed. She was found by her family early in the am on the floor. Per daughter she was note to cold. Per her daughter she had been somewhat confused different from her usual baseline. Patient denies any fever/ chills/ chest pain, nausea/vomitting/sorethroat or muscle aches. She also denies diarrhea currently but notes she has constipation at times. She notes no significant shortness of breath. She does have complaint of bladder spasm which are intermittent and intense.She notes she does have significant symptoms related to her OAB.   ED Course: Temp 97.5, bp 178/55, sat 98% on 2L  Labs: wbc 7.5, hgb 10.9 at baseline, mcv 100, plt 238 NA 135, K 4.5, CL 104, bicarb 19, glu 128, cr 1.38 at baseline UA:neg Lactic 1.4 -d-dimer 3.300 ABG: Ph 7.41/ pco2 32, po2 69  covid:neg cxr: Airspace opacity consistent with pneumonia left upper lobe. Question atypical organism pneumonia given this appearance. Advise check of COVID-19 status.  Lungs elsewhere clear. Status post coronary artery bypass grafting. Heart size normal. No  adenopathy.  Aortic Atherosclerosis (ICD10-I70.0).  CT head Age related volume loss with stable patchy periventricular small vessel disease. No acute infarct. No mass or hemorrhage.  There are foci of arterial vascular calcification. There is mucosal thickening in several ethmoid air cells  tx fentanyl, zofran  Review of Systems: As per HPI otherwise 10 point review of systems negative.   Past Medical History:  Diagnosis Date   Anemia    Arthritis    Blood dyscrasia    since being put on Brilinta after stent placement developing hematoma   Cancer ([place])    basal cell left leg   CHF (congestive heart failure) ([facility])    GERD (gastroesophageal reflux disease)    Headache    h/o migraines   Hyperlipidemia    Hypertension    Leukopenia    MI (myocardial infarction) ([place]) 02/2015   s/p 4 stents and CABG in 2017   Renal disorder    stage 3-Dr Caryl Comes keeping a check on kidney function   Shortness of breath dyspnea    mild compared to prior stenting-pt states she would have to stop and rest if walking a mile   Thyroid disease     Past Surgical History:  Procedure Laterality Date   ABDOMINAL HYSTERECTOMY     CARDIAC CATHETERIZATION N/A 12/08/2015   Procedure: Left Heart Cath and Coronary Angiography;  Surgeon: Yolonda Kida, MD;  Location: [facility];  Service: Cardiovascular;  Laterality: N/A;   CORONARY ANGIOPLASTY  WITH STENT PLACEMENT     CORONARY STENT PLACEMENT  2016   x2   EYE SURGERY Bilateral 2011   INCISION AND DRAINAGE Left 08/26/2015   Procedure: INCISION AND DRAINAGE / HAND HEMATOMA;  Surgeon: Hessie Knows, MD;  Location: [facility];  Service: Orthopedics;  Laterality: Left;   JOINT REPLACEMENT Bilateral    left 2016 and right 2015   TONSILLECTOMY     3rd grade     reports that she has never smoked. She has never used smokeless tobacco. She reports that she does not drink alcohol and does not use drugs.  Allergies  Allergen  Reactions   Brilinta [Ticagrelor] Other (See Comments)    "caused stent to fail"   Imdur [Isosorbide Nitrate] Other (See Comments)    Headache    Family History  Problem Relation Age of Onset   Breast cancer Paternal 12    Brain cancer Mother    Stroke Father    Brain cancer Son     Prior to Admission medications   Medication Sig Start Date End Date Taking? Authorizing Provider  acetaminophen (TYLENOL) 500 MG tablet Take 500-1,000 mg by mouth every 6 (six) hours as needed for pain.   Yes [provider]  aspirin EC 81 MG tablet Take 81 mg by mouth daily.   Yes [provider]  atorvastatin (LIPITOR) 80 MG tablet Take 80 mg by mouth every evening.    Yes [provider]  cloNIDine (CATAPRES) 0.1 MG tablet Take 0.1 mg by mouth 2 (two) times daily as needed (hypertensive episode).   Yes [provider]  cycloSPORINE (RESTASIS) 0.05 % ophthalmic emulsion Place 1 drop into both eyes 2 (two) times daily.    Yes [provider]  DULoxetine (CYMBALTA) 30 MG capsule Take 30 mg by mouth every evening.   Yes [provider]  lamoTRIgine (LAMICTAL) 25 MG tablet Take 25 mg by mouth every evening.   Yes [provider]  metoprolol succinate (TOPROL-XL) 25 MG 24 hr tablet Take 25 mg by mouth every evening. 05/03/20  Yes [provider]  nitroGLYCERIN (NITROSTAT) 0.4 MG SL tablet Place 0.4 mg under the tongue every 5 (five) minutes x 3 doses as needed for chest pain.   Yes [provider]  ranolazine (RANEXA) 1000 MG SR tablet Take 1,000 mg by mouth 2 (two) times daily. 11/30/17  Yes [provider]  Vibegron (GEMTESA) 75 MG TABS Take 75 mg by mouth daily. 05/10/20  Yes Debroah Loop, PA-C    Physical Exam: Vitals:   05/28/20 0948 05/28/20 0950 05/28/20 1139 05/28/20 1428  BP:  (!) 178/55 (!) 174/60 (!) 130/103  Pulse:  65 65 73  Resp:  18 16 18   Temp:  (!) 97.5 F (36.4 C)    TempSrc:   Oral    SpO2:  98% 97% 90%  Weight: 52.2 kg     Height: 5\' 7"  (1.702 m)       Constitutional: NAD, calm, comfortable Vitals:   05/28/20 0948 05/28/20 0950 05/28/20 1139 05/28/20 1428  BP:  (!) 178/55 (!) 174/60 (!) 130/103  Pulse:  65 65 73  Resp:  18 16 18   Temp:  (!) 97.5 F (36.4 C)    TempSrc:  Oral    SpO2:  98% 97% 90%  Weight: 52.2 kg     Height: 5\' 7"  (1.702 m)     Constitutional: calm, does appear some what uncomfortable, no increase work of breathing Eyes: PERRL, lids  and conjunctivae normal ENMT: Mucous membranes are moist. Posterior pharynx clear of any exudate or lesions.Normal dentition.  Neck: normal, supple, no masses, no thyromegaly Respiratory: crackles on left as well as rhonchi no wheezing,  Normal respiratory effort. No accessory muscle use.  Cardiovascular: Regular rate and rhythm, no murmurs / rubs / gallops. No extremity edema. 2+ pedal pulses. Abdomen:suprapubic tenderness, distended bladder no masses palpated. No hepatosplenomegaly. Bowel sounds positive.  Musculoskeletal: no clubbing / cyanosis. No joint deformity upper and lower extremities. Good ROM, no contractures. Normal muscle tone.  Skin: abrasion upper extremity ,No induration Neurologic: CN 2-12 grossly intact. Sensation intact, DTR normal. Strength 5/5 in all 4.  Psychiatric: Normal judgment and insight. Alert and oriented x 3. Normal mood.    Labs on Admission: I have personally reviewed following labs and imaging studies  CBC: Recent Labs  Lab 05/28/20 1004  WBC 7.5  HGB 10.9*  HCT 33.4*  MCV 100.0  PLT 102   Basic Metabolic Panel: Recent Labs  Lab 05/28/20 1004  NA 135  K 4.5  CL 104  CO2 19*  GLUCOSE 128*  BUN 39*  CREATININE 1.38*  CALCIUM 9.7   GFR: Estimated Creatinine Clearance: 23.2 mL/min (A) (by C-G formula based on SCr of 1.38 mg/dL (H)). Liver Function Tests: No results for input(s): AST, ALT, ALKPHOS, BILITOT, PROT, ALBUMIN in the last 168 hours. No  results for input(s): LIPASE, AMYLASE in the last 168 hours. No results for input(s): AMMONIA in the last 168 hours. Coagulation Profile: No results for input(s): INR, PROTIME in the last 168 hours. Cardiac Enzymes: No results for input(s): CKTOTAL, CKMB, CKMBINDEX, TROPONINI in the last 168 hours. BNP (last 3 results) No results for input(s): PROBNP in the last 8760 hours. HbA1C: No results for input(s): HGBA1C in the last 72 hours. CBG: No results for input(s): GLUCAP in the last 168 hours. Lipid Profile: No results for input(s): CHOL, HDL, LDLCALC, TRIG, CHOLHDL, LDLDIRECT in the last 72 hours. Thyroid Function Tests: No results for input(s): TSH, T4TOTAL, FREET4, T3FREE, THYROIDAB in the last 72 hours. Anemia Panel: No results for input(s): VITAMINB12, FOLATE, FERRITIN, TIBC, IRON, RETICCTPCT in the last 72 hours. Urine analysis:    Component Value Date/Time   COLORURINE YELLOW (A) 05/28/2020 1137   APPEARANCEUR CLEAR (A) 05/28/2020 1137   APPEARANCEUR Hazy 03/16/2014 1156   LABSPEC 1.016 05/28/2020 1137   LABSPEC 1.018 03/16/2014 1156   PHURINE 5.0 05/28/2020 1137   GLUCOSEU NEGATIVE 05/28/2020 1137   GLUCOSEU Negative 03/16/2014 1156   HGBUR NEGATIVE 05/28/2020 1137   BILIRUBINUR NEGATIVE 05/28/2020 1137   BILIRUBINUR Negative 03/16/2014 [place] 05/28/2020 1137   PROTEINUR 30 (A) 05/28/2020 1137   NITRITE NEGATIVE 05/28/2020 1137   LEUKOCYTESUR NEGATIVE 05/28/2020 1137   LEUKOCYTESUR Trace 03/16/2014 1156    Radiological Exams on Admission: DG Chest 2 View  Result Date: 05/28/2020 CLINICAL DATA:  Shortness of breath EXAM: CHEST - 2 VIEW COMPARISON:  February 09, 2020 FINDINGS: There is airspace opacity throughout much of the left upper lobe without significant consolidation. Lungs elsewhere clear. Heart size and pulmonary vascularity are normal. Patient is status post coronary artery bypass grafting. No adenopathy. There is aortic atherosclerosis. No  bone lesions. IMPRESSION: Airspace opacity consistent with pneumonia left upper lobe. Question atypical organism pneumonia given this appearance. Advise check of COVID-19 status. Lungs elsewhere clear. Status post coronary artery bypass grafting. Heart size normal. No adenopathy. Aortic Atherosclerosis (ICD10-I70.0). Electronically Signed   By: Gwyndolyn Saxon  Jasmine December III M.D.   On: 05/28/2020 10:34   CT Head Wo Contrast  Result Date: 05/28/2020 CLINICAL DATA:  Pain following fall EXAM: CT HEAD WITHOUT CONTRAST TECHNIQUE: Contiguous axial images were obtained from the base of the skull through the vertex without intravenous contrast. COMPARISON:  December 31, 2019 FINDINGS: Brain: There is stable age related volume loss. There is no intracranial mass, hemorrhage, extra-axial fluid collection, or midline shift. There is patchy small vessel disease in the centra semiovale bilaterally, stable. No acute infarct is evident. Vascular: No hyperdense vessel. Foci of calcification noted in the distal left vertebral artery and in each carotid siphon region. Skull: The bony calvarium appears intact. Sinuses/Orbits: There is mucosal thickening in multiple ethmoid air cells. Orbits appear symmetric bilaterally. Other: Visualized mastoid air cells are clear. IMPRESSION: Age related volume loss with stable patchy periventricular small vessel disease. No acute infarct. No mass or hemorrhage. There are foci of arterial vascular calcification. There is mucosal thickening in several ethmoid air cells. Electronically Signed   By: Lowella Grip III M.D.   On: 05/28/2020 10:36   CT ABDOMEN PELVIS W CONTRAST  Result Date: 05/28/2020 CLINICAL DATA:  Recent fall with abdominal pain, initial encounter EXAM: CT ABDOMEN AND PELVIS WITH CONTRAST TECHNIQUE: Multidetector CT imaging of the abdomen and pelvis was performed using the standard protocol following bolus administration of intravenous contrast. CONTRAST:  24mL OMNIPAQUE IOHEXOL 300  MG/ML  SOLN COMPARISON:  Chest x-ray from earlier in the same day. FINDINGS: Lower chest: Small bilateral pleural effusions are noted. Bibasilar atelectatic changes are again seen similar to that noted on the prior plain film of the chest. Loculated fluid is noted adjacent to the distal descending thoracic aorta consistent with small loculated effusion. Hepatobiliary: Gallbladder is well distended although no cholelithiasis is seen. The liver is within normal limits. Pancreas: Unremarkable. No pancreatic ductal dilatation or surrounding inflammatory changes. Spleen: Normal in size without focal abnormality. Adrenals/Urinary Tract: Adrenal glands are within normal limits. Right kidney demonstrates no mass lesion or hydronephrosis. Small lower pole cyst is noted. Left kidney demonstrates exophytic cyst from the upper pole measuring 5 cm. Smaller cysts are noted. No renal calculi or obstructive changes are noted. The bladder is well distended. Stomach/Bowel: Diverticular change of the colon is noted without evidence of definitive diverticulitis. Considerable retained fecal material is noted throughout the colon consistent with at least a mild degree of constipation. The appendix is not well visualized and may have been surgically removed. No inflammatory changes are seen. The stomach is within normal limits. No small bowel abnormality is seen. Vascular/Lymphatic: Aortic atherosclerosis. No enlarged abdominal or pelvic lymph nodes. Reproductive: Uterus has been surgically removed. Cystic changes are noted in the right ovary. These appear simple in nature. The largest of these measures 3.1 Cm in dimension. Other: No abdominal wall hernia or abnormality. No abdominopelvic ascites. Musculoskeletal: Bilateral hip replacements are seen. No compression deformities are noted. Mild degenerative changes of lumbar spine are noted. IMPRESSION: Small bilateral pleural effusions with bibasilar atelectatic changes similar to that  seen on prior chest x-ray. Bilateral renal cysts simple in nature. Diverticulosis without diverticulitis. Cystic changes in the right ovary. The uterus has been surgically removed. Recommend follow-up US in 6-12 months. Note: This recommendation does not apply to premenarchal patients and to those with increased risk (genetic, family history, elevated tumor markers or other high-risk factors) of ovarian cancer. Reference: JACR 2020 Feb; 17(2):248-254 Electronically Signed   By: Inez Catalina M.D.   On:  05/28/2020 15:19   DG Hip Unilat W or Wo Pelvis 2-3 Views Left  Result Date: 05/28/2020 CLINICAL DATA:  Left hip pain after fall. EXAM: DG HIP (WITH OR WITHOUT PELVIS) 2-3V LEFT COMPARISON:  None. FINDINGS: Status post bilateral total hip arthroplasties. No fracture or dislocation is noted. Vascular calcifications are noted. IMPRESSION: No acute abnormality seen in the left hip. Electronically Signed   By: Marijo Conception M.D.   On: 05/28/2020 13:56    EKG: Independently reviewed. nsr LAD/LVH  Assessment/Pla CAP -Left upper lobe Opacities on cxr  -ctx/ azithromycin, de-escalate as able  -pulmonary toilet  -urine ag, sputum, f/u on culture data   -currently not need for O2  Metabolic Encephalopathy -due to infection  -supportive care   Elevated d-dimer -lower extremity dopplers pending -V/Q in am   CKDIII -at baseline  -GFR 37  Debility due to acute illness  -s/p FaLL no significant injuries  - PT/OT   OAB -severe symptoms  -resume home regimen( gemtesa) - bladder scan  Evaluation for retention  -straight cath if patient note to have retention  -followed as out patient by urology  CAD s/p MI s/p CABG  -hx of stable angina  -continue metoprolol , ranexa, high dose statin ,nitro prn    HTN  -elevated in ED -resume home medication  - amlodipine, clonidine  SZ d/o nos -sz d/o,dissociative episodes nos  -cont on lamotrigine followed by neurology  Anxiety /depression   Continue home regimen    DVT prophylaxis:  lovenox  Code Status: FULL Family Communication:  daughter Wynelle Beckmann (Daughter)  [phone] (Home Phone Disposition Plan: patient  expected to be admitted greater than 2 midnights Consults called: n/a Admission status: inpatient/med surg  Clance Boll MD [facility]   If 7PM-7AM, please contact night-coverage [email or web address] Password [facility]  05/28/2020, 5:01 PM

## 2020-05-28 NOTE — Progress Notes (Signed)
Pharmacy Antibiotic Note  KOBI ALLER is a 87 y.o. female admitted on 05/28/2020 with pneumonia.  Pharmacy has been consulted for levaquin dosing.  Plan: Levaquin 750 mg IV Q48H ordered to start on 1/22 @ 1600.   Azithromycin given on 1/21 @ 1600.   Height: 5\' 7"  (170.2 cm) Weight: 52.2 kg (115 lb 1.3 oz) IBW/kg (Calculated) : 61.6  Temp (24hrs), Avg:97.5 F (36.4 C), Min:97.5 F (36.4 C), Max:97.5 F (36.4 C)  Recent Labs  Lab 05/28/20 1004 05/28/20 1333  WBC 7.5  --   CREATININE 1.38*  --   LATICACIDVEN  --  1.4    Estimated Creatinine Clearance: 23.2 mL/min (A) (by C-G formula based on SCr of 1.38 mg/dL (H)).    Allergies  Allergen Reactions  . Brilinta [Ticagrelor] Other (See Comments)    "caused stent to fail"  . Imdur [Isosorbide Nitrate] Other (See Comments)    Headache    Antimicrobials this admission:   >>    >>   Dose adjustments this admission:   Microbiology results:  BCx:   UCx:    Sputum:    MRSA PCR:   Thank you for allowing pharmacy to be a part of this patient's care.  Lorana Maffeo D 05/28/2020 8:29 PM

## 2020-05-28 NOTE — ED Provider Notes (Signed)
[facility] Emergency Department Provider Note ____________________________________________   Event Date/Time   First MD Initiated Contact with Patient 05/28/20 1205     (approximate)  I have reviewed the triage vital signs and the nursing notes.   HISTORY  Chief Complaint Fall    HPI Monique Burns is a 87 y.o. female with PMH as noted below who presents after she fell early this morning and was found on the floor by her family.  She states that she was probably getting up to use the bathroom and thinks she slipped.  She has pain to the left hip but denies other injuries.  She also reports some cough and generalized weakness over the last few days.  Her daughter thinks she has been somewhat more weak and confused than her baseline.  Past Medical History:  Diagnosis Date  . Anemia   . Arthritis   . Blood dyscrasia    since being put on Brilinta after stent placement developing hematoma  . Cancer ([place])    basal cell left leg  . CHF (congestive heart failure) ([place])   . GERD (gastroesophageal reflux disease)   . Headache    h/o migraines  . Hyperlipidemia   . Hypertension   . Leukopenia   . MI (myocardial infarction) ([place]) 02/2015   s/p 4 stents and CABG in 2017  . Renal disorder    stage 3-Dr Caryl Comes keeping a check on kidney function  . Shortness of breath dyspnea    mild compared to prior stenting-pt states she would have to stop and rest if walking a mile  . Thyroid disease     Patient Active Problem List   Diagnosis Date Noted  . Overactive bladder 04/08/2020  . Urge incontinence 04/08/2020  . Nocturia 04/08/2020  . Unstable angina ([place]) 12/20/2017  . Protein-calorie malnutrition, severe 11/27/2015  . Chest pain 11/26/2015  . Chronic kidney disease 04/05/2015  . HLD (hyperlipidemia) 04/05/2015  . BP (high blood pressure) 04/05/2015  . Decreased leukocytes 04/05/2015  . Arthritis, degenerative 04/05/2015  . Disease of thyroid gland  04/05/2015  . Acute myocardial infarction of anterior wall ([place]) 02/28/2015  . Degenerative arthritis of hip 10/31/2013    Past Surgical History:  Procedure Laterality Date  . ABDOMINAL HYSTERECTOMY    . CARDIAC CATHETERIZATION N/A 12/08/2015   Procedure: Left Heart Cath and Coronary Angiography;  Surgeon: Yolonda Kida, MD;  Location: [place] [facility];  Service: Cardiovascular;  Laterality: N/A;  . CORONARY ANGIOPLASTY WITH STENT PLACEMENT    . CORONARY STENT PLACEMENT  2016   x2  . EYE SURGERY Bilateral 2011  . INCISION AND DRAINAGE Left 08/26/2015   Procedure: INCISION AND DRAINAGE / HAND HEMATOMA;  Surgeon: Hessie Knows, MD;  Location: [facility] ORS;  Service: Orthopedics;  Laterality: Left;  . JOINT REPLACEMENT Bilateral    left 2016 and right 2015  . TONSILLECTOMY     3rd grade    Prior to Admission medications   Medication Sig Start Date End Date Taking? Authorizing Provider  acetaminophen (TYLENOL) 500 MG tablet Take 500 mg by mouth every 6 (six) hours as needed for pain.    [provider]  amLODipine (NORVASC) 2.5 MG tablet Take 1 tablet (2.5 mg total) by mouth daily. 12/21/17 12/21/18  Gladstone Lighter, MD  aspirin EC 81 MG tablet Take 81 mg by mouth daily.    [provider]  atorvastatin (LIPITOR) 80 MG tablet Take 80 mg by mouth every evening.  [provider]  cloNIDine (CATAPRES) 0.1 MG tablet Take 0.1 mg by mouth 2 (two) times daily. 02/12/20   [provider]  cycloSPORINE (RESTASIS) 0.05 % ophthalmic emulsion Place 1 drop into both eyes 2 (two) times daily.  11/17/13   [provider]  DULoxetine (CYMBALTA) 30 MG capsule Take 30 mg by mouth daily.     [provider]  metoprolol tartrate (LOPRESSOR) 25 MG tablet Take 12.5 mg by mouth every 12 (twelve) hours. 06/28/16   [provider]  nitroGLYCERIN (NITROSTAT) 0.4 MG SL tablet Place 0.4 mg under the tongue every 5 (five) minutes x 3 doses as needed.  05/02/16   [provider]  ONE TOUCH ULTRA TEST test strip USE AS DIRECTED DAILY DX:E16.2 12/28/17   [provider]  ranolazine (RANEXA) 1000 MG SR tablet Take 1,000 mg by mouth 2 (two) times daily. 11/30/17   [provider]  tiZANidine (ZANAFLEX) 2 MG tablet Take 2 mg by mouth at bedtime as needed. pain 12/12/17   [provider]  Vibegron (GEMTESA) 75 MG TABS Take 75 mg by mouth daily. 05/10/20   Debroah Loop, PA-C    Allergies Brilinta [ticagrelor] and Imdur [isosorbide nitrate]  Family History  Problem Relation Age of Onset  . Breast cancer Paternal Grandmother   . Brain cancer Mother   . Stroke Father   . Brain cancer Son     Social History Social History   Tobacco Use  . Smoking status: Never Smoker  . Smokeless tobacco: Never Used  Vaping Use  . Vaping Use: Never used  Substance Use Topics  . Alcohol use: No  . Drug use: No    Review of Systems  Constitutional: No fever.  Positive for weakness. Eyes: No redness. ENT: No sore throat. Cardiovascular: Denies chest pain. Respiratory: Denies shortness of breath.  Positive for cough. Gastrointestinal: No vomiting or diarrhea.  Genitourinary: Negative for dysuria.  Musculoskeletal: Negative for back pain. Skin: Negative for rash. Neurological: Negative for headache.   ____________________________________________   PHYSICAL EXAM:  VITAL SIGNS: ED Triage Vitals  Enc Vitals Group     BP 05/28/20 0950 (!) 178/55     Pulse Rate 05/28/20 0950 65     Resp 05/28/20 0950 18     Temp 05/28/20 0950 (!) 97.5 F (36.4 C)     Temp Source 05/28/20 0950 Oral     SpO2 05/28/20 0950 98 %     Weight 05/28/20 0948 115 lb 1.3 oz (52.2 kg)     Height 05/28/20 0948 5\' 7"  (1.702 m)     Head Circumference --      Peak Flow --      Pain Score 05/28/20 1139 10     Pain Loc --      Pain Edu? --      Excl. in [place]? --     Constitutional: Alert and oriented.  Somewhat weak and frail  appearing but in no acute distress. Eyes: Conjunctivae are normal.  EOMI. Head: Atraumatic. Nose: No congestion/rhinnorhea. Mouth/Throat: Mucous membranes are dry.   Neck: Normal range of motion.  Cardiovascular: Normal rate, regular rhythm. Grossly normal heart sounds.  Good peripheral circulation. Respiratory: Normal respiratory effort.  No retractions. Lungs CTAB. Gastrointestinal: Soft and nontender. No distention.  Genitourinary: No flank tenderness. Musculoskeletal: No lower extremity edema.  Extremities warm and well perfused.  Full range of motion to bilateral hips and knees with some tenderness to palpation on the left. Neurologic:  Normal  speech and language.  Motor intact in all extremities.  Normal coordination. Skin:  Skin is warm and dry. No rash noted. Psychiatric: Mood and affect are normal. Speech and behavior are normal.  ____________________________________________   LABS (all labs ordered are listed, but only abnormal results are displayed)  Labs Reviewed  BASIC METABOLIC PANEL - Abnormal; Notable for the following components:      Result Value   CO2 19 (*)    Glucose, Bld 128 (*)    BUN 39 (*)    Creatinine, Ser 1.38 (*)    GFR, Estimated 37 (*)    All other components within normal limits  CBC - Abnormal; Notable for the following components:   RBC 3.34 (*)    Hemoglobin 10.9 (*)    HCT 33.4 (*)    All other components within normal limits  URINALYSIS, COMPLETE (UACMP) WITH MICROSCOPIC - Abnormal; Notable for the following components:   Color, Urine YELLOW (*)    APPearance CLEAR (*)    Protein, ur 30 (*)    All other components within normal limits  CULTURE, BLOOD (SINGLE)  SARS CORONAVIRUS 2 (TAT 6-24 HRS)  LACTIC ACID, PLASMA  BLOOD GAS, ARTERIAL  FIBRIN DERIVATIVES D-DIMER ([facility] ONLY)  CBG MONITORING, ED  POC SARS CORONAVIRUS 2 AG -  ED   ____________________________________________  EKG  ED ECG REPORT I, Arta Silence, the attending  physician, personally viewed and interpreted this ECG.  Date: 05/28/2020 EKG Time: 0952 Rate: 65 Rhythm: normal sinus rhythm QRS Axis: Left axis Intervals: normal ST/T Wave abnormalities: normal Narrative Interpretation: no evidence of acute ischemia  ____________________________________________  RADIOLOGY  Chest x-ray interpreted by me shows left upper lobe infiltrate XR L hip: No acute fracture CT head: No ICH or other acute abnormality CT abdomen: Cystic changes in the right ovary.  No acute intra-abdominal findings.  ____________________________________________   PROCEDURES  Procedure(s) performed: No  Procedures  Critical Care performed: Yes  CRITICAL CARE Performed by: Arta Silence   Total critical care time: 15 minutes  Critical care time was exclusive of separately billable procedures and treating other patients.  Critical care was necessary to treat or prevent imminent or life-threatening deterioration.  Critical care was time spent personally by me on the following activities: development of treatment plan with patient and/or surrogate as well as nursing, discussions with consultants, evaluation of patient's response to treatment, examination of patient, obtaining history from patient or surrogate, ordering and performing treatments and interventions, ordering and review of laboratory studies, ordering and review of radiographic studies, pulse oximetry and re-evaluation of patient's condition. ____________________________________________   INITIAL IMPRESSION / ASSESSMENT AND PLAN / ED COURSE  Pertinent labs & imaging results that were available during my care of the patient were reviewed by me and considered in my medical decision making (see chart for details).  87 year old female with PMH as noted above presents after she was found on the floor this morning after apparently falling when she got out of bed sometime during the night.  The patient  remembers slipping and reports pain to her left hip.  She has also had a cough over the last few days and has seemed somewhat weak and confused to the daughter, who is here in person and was able to give additional history.  On exam, the patient is somewhat frail appearing but in no acute distress.  Her vital signs are normal, except her O2 saturation was 90% on room air when she arrived.  She has no  acute respiratory distress.  Abdomen is soft and nontender.  Neurologic exam is nonfocal.  There is some tenderness but normal range of motion to the left hip.  CT head obtained from triage is negative for acute findings.  Chest x-ray shows evidence of likely left upper lobe pneumonia which would go along with the patient's symptoms.  We will obtain sepsis labs, COVID swab, give empiric antibiotics, and given the patient's weakness and difficulty walking will plan for admission.  We will also obtain an x-ray of the left hip.  ----------------------------------------- 3:46 PM on 05/28/2020 -----------------------------------------  COVID antigen is negative.  PCR has been ordered.  Lactate is normal.  X-ray of the hip shows no acute fracture.  While receiving Rocephin, the patient had an episode in which she became more short of breath, very uncomfortable appearing, and reported mainly right-sided abdominal pain.  She had no rash or hives.  It is not clear if this was an allergic reaction.  The Rocephin was stopped, and the patient received Benadryl as well as some fentanyl and Zofran.  I obtained a CT abdomen which shows no significant acute findings.  On reassessment the patient is much more comfortable appearing and the abdominal pain has resolved.  She still has not developed any hives or clear allergic symptoms.  Given the patient's weakness, we will proceed with admission for treatment for pneumonia.  I discussed the case with Dr. Marcello Moores from the hospitalist service,     ________________________  [name] was evaluated in Emergency Department on 05/28/2020 for the symptoms described in the history of present illness. She was evaluated in the context of the global COVID-19 pandemic, which necessitated consideration that the patient might be at risk for infection with the SARS-CoV-2 virus that causes COVID-19. Institutional protocols and algorithms that pertain to the evaluation of patients at risk for COVID-19 are in a state of rapid change based on information released by regulatory bodies including the CDC and federal and state organizations. These policies and algorithms were followed during the patient's care in the ED.  ____________________________________________   FINAL CLINICAL IMPRESSION(S) / ED DIAGNOSES  Final diagnoses:  Community acquired pneumonia of left upper lobe of lung  Contusion of left hip, initial encounter      NEW MEDICATIONS STARTED DURING THIS VISIT:  New Prescriptions   No medications on file     Note:  This document was prepared using Dragon voice recognition software and may include unintentional dictation errors.   Arta Silence, MD 05/28/20 1549

## 2020-05-28 NOTE — Progress Notes (Signed)
Anticoagulation monitoring(Lovenox):  87 yo  female ordered Lovenox 40 mg Q24h  Filed Weights   05/28/20 0948  Weight: 52.2 kg (115 lb 1.3 oz)   BMI    Lab Results  Component Value Date   CREATININE 1.38 (H) 05/28/2020   CREATININE 1.46 (H) 09/26/2019   CREATININE 1.42 (H) 09/27/2018   Estimated Creatinine Clearance: 23.2 mL/min (A) (by C-G formula based on SCr of 1.38 mg/dL (H)). Hemoglobin & Hematocrit     Component Value Date/Time   HGB 10.9 (L) 05/28/2020 1004   HGB 9.2 (L) 03/25/2014 0407   HCT 33.4 (L) 05/28/2020 1004   HCT 41.3 03/16/2014 1156     Per Protocol for Patient with estCrcl < 30 ml/min and BMI < 30, will transition to Lovenox 30 mg Q24h.

## 2020-05-29 ENCOUNTER — Inpatient Hospital Stay: Payer: Medicare Other

## 2020-05-29 DIAGNOSIS — R296 Repeated falls: Secondary | ICD-10-CM

## 2020-05-29 DIAGNOSIS — N1832 Chronic kidney disease, stage 3b: Secondary | ICD-10-CM

## 2020-05-29 DIAGNOSIS — G9341 Metabolic encephalopathy: Secondary | ICD-10-CM

## 2020-05-29 DIAGNOSIS — J181 Lobar pneumonia, unspecified organism: Principal | ICD-10-CM

## 2020-05-29 DIAGNOSIS — R7989 Other specified abnormal findings of blood chemistry: Secondary | ICD-10-CM

## 2020-05-29 DIAGNOSIS — D649 Anemia, unspecified: Secondary | ICD-10-CM

## 2020-05-29 DIAGNOSIS — I10 Essential (primary) hypertension: Secondary | ICD-10-CM

## 2020-05-29 LAB — CBC
HCT: 30.4 % — ABNORMAL LOW (ref 36.0–46.0)
Hemoglobin: 9.6 g/dL — ABNORMAL LOW (ref 12.0–15.0)
MCH: 32 pg (ref 26.0–34.0)
MCHC: 31.6 g/dL (ref 30.0–36.0)
MCV: 101.3 fL — ABNORMAL HIGH (ref 80.0–100.0)
Platelets: 224 10*3/uL (ref 150–400)
RBC: 3 MIL/uL — ABNORMAL LOW (ref 3.87–5.11)
RDW: 12.5 % (ref 11.5–15.5)
WBC: 8 10*3/uL (ref 4.0–10.5)
nRBC: 0 % (ref 0.0–0.2)

## 2020-05-29 LAB — COMPREHENSIVE METABOLIC PANEL
ALT: 18 U/L (ref 0–44)
AST: 39 U/L (ref 15–41)
Albumin: 3.9 g/dL (ref 3.5–5.0)
Alkaline Phosphatase: 60 U/L (ref 38–126)
Anion gap: 13 (ref 5–15)
BUN: 30 mg/dL — ABNORMAL HIGH (ref 8–23)
CO2: 19 mmol/L — ABNORMAL LOW (ref 22–32)
Calcium: 9.4 mg/dL (ref 8.9–10.3)
Chloride: 104 mmol/L (ref 98–111)
Creatinine, Ser: 1.3 mg/dL — ABNORMAL HIGH (ref 0.44–1.00)
GFR, Estimated: 40 mL/min — ABNORMAL LOW (ref >60)
Glucose, Bld: 109 mg/dL — ABNORMAL HIGH (ref 70–99)
Potassium: 4.1 mmol/L (ref 3.5–5.1)
Sodium: 136 mmol/L (ref 135–145)
Total Bilirubin: 1.2 mg/dL (ref 0.3–1.2)
Total Protein: 6.9 g/dL (ref 6.5–8.1)

## 2020-05-29 LAB — FIBRIN DERIVATIVES D-DIMER (ARMC ONLY): Fibrin derivatives D-dimer (ARMC): 1994.74 ng/mL (FEU) — ABNORMAL HIGH (ref 0.00–499.00)

## 2020-05-29 LAB — STREP PNEUMONIAE URINARY ANTIGEN: Strep Pneumo Urinary Antigen: NEGATIVE

## 2020-05-29 LAB — SARS CORONAVIRUS 2 (TAT 6-24 HRS): SARS Coronavirus 2: NEGATIVE

## 2020-05-29 LAB — CK: Total CK: 370 U/L — ABNORMAL HIGH (ref 38–234)

## 2020-05-29 LAB — HIV ANTIBODY (ROUTINE TESTING W REFLEX): HIV Screen 4th Generation wRfx: NONREACTIVE

## 2020-05-29 MED ORDER — OXYCODONE HCL 5 MG PO TABS
2.5000 mg | ORAL_TABLET | Freq: Four times a day (QID) | ORAL | Status: DC | PRN
  Filled 2020-05-29: qty 1

## 2020-05-29 MED ORDER — CHLORHEXIDINE GLUCONATE CLOTH 2 % EX PADS
6.0000 | MEDICATED_PAD | Freq: Every day | CUTANEOUS | Status: DC
  Administered 2020-05-29 – 2020-06-01 (×2): 6 via TOPICAL

## 2020-05-29 MED ORDER — OXYCODONE HCL 5 MG PO TABS
5.0000 mg | ORAL_TABLET | Freq: Four times a day (QID) | ORAL | Status: DC | PRN
  Administered 2020-05-29 – 2020-05-30 (×3): 5 mg via ORAL
  Filled 2020-05-29 (×3): qty 1

## 2020-05-29 MED ORDER — SODIUM CHLORIDE 0.9 % IV SOLN: INTRAVENOUS | Status: DC

## 2020-05-29 NOTE — Progress Notes (Signed)
OT Cancellation Note  Patient Details Name: Monique Burns MRN: [id number] DOB: [birth year]   Cancelled Treatment:    Reason Eval/Treat Not Completed: Other (comment). OT order received and chart reviewed. Pt noted to be pending [place] to r/o DVT and brain MRI. Will follow remotely and initiate services  At later time pending further work up completed and pt medically appropriate.   Dessie Coma, M.S. OTR/L  05/29/20, 9:33 AM  ascom [phone]

## 2020-05-29 NOTE — Plan of Care (Signed)

## 2020-05-29 NOTE — Evaluation (Signed)
Clinical/Bedside Swallow Evaluation Patient Details  Name: NANCE MCCOMBS MRN: [id number] Date of Birth: July 08, 1930  Today's Date: 05/29/2020 Time: SLP Start Time (ACUTE ONLY): 34 SLP Stop Time (ACUTE ONLY): 1100 SLP Time Calculation (min) (ACUTE ONLY): 40 min  Past Medical History:  Past Medical History:  Diagnosis Date  . Anemia   . Arthritis   . Blood dyscrasia    since being put on Brilinta after stent placement developing hematoma  . Cancer ([place])    basal cell left leg  . CHF (congestive heart failure) ([place])   . GERD (gastroesophageal reflux disease)   . Headache    h/o migraines  . Hyperlipidemia   . Hypertension   . Leukopenia   . MI (myocardial infarction) ([place]) 02/2015   s/p 4 stents and CABG in 2017  . Renal disorder    stage 3-Dr Caryl Comes keeping a check on kidney function  . Shortness of breath dyspnea    mild compared to prior stenting-pt states she would have to stop and rest if walking a mile  . Thyroid disease    Past Surgical History:  Past Surgical History:  Procedure Laterality Date  . ABDOMINAL HYSTERECTOMY    . CARDIAC CATHETERIZATION N/A 12/08/2015   Procedure: Left Heart Cath and Coronary Angiography;  Surgeon: Yolonda Kida, MD;  Location: [place] [facility] LAB;  Service: Cardiovascular;  Laterality: N/A;  . CORONARY ANGIOPLASTY WITH STENT PLACEMENT    . CORONARY STENT PLACEMENT  2016   x2  . EYE SURGERY Bilateral 2011  . INCISION AND DRAINAGE Left 08/26/2015   Procedure: INCISION AND DRAINAGE / HAND HEMATOMA;  Surgeon: Hessie Knows, MD;  Location: [facility] ORS;  Service: Orthopedics;  Laterality: Left;  . JOINT REPLACEMENT Bilateral    left 2016 and right 2015  . TONSILLECTOMY     3rd grade   HPI:  Per admitting H&P "TRESA JOLLEY is a 87 y.o. 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 Patient states that for one week she had a cough and generally feeling somewhat unwell. She states last pm while attempting to get in bed she felt weak and fell. She noted that she his her head and had pain to left hip. She states she was unable to get up. She did not call out. She thought that if she rested she would be able to gather her strength and get in bed. She was found by her family early in the am on the floor. Per daughter she was note to cold. Per her daughter she had been somewhat confused different from her usual baseline. Patient denies any fever/ chills/ chest pain, nausea/vomitting/sorethroat or muscle aches. She also denies diarrhea currently but notes she has constipation at times. She notes no significant shortness of breath. She does have complaint of bladder spasm which are intermittent and intense.She notes she does have significant symptoms related to her OAB"   Assessment / Plan / Recommendation Clinical Impression  Mrs. Bobst is a pleasant, somewhat confused 87 YO female brought to the ED for further assessment after sustaining a fall at home. Daughter was present and supportive and reports the confusion is new. Per CT scan, there is no  intracranial mass, hemorrhage, or acute infarct. Pt reported that she sometimes has swallowing difficulty and currently has pneumonia noted on chest  xray. Bedside swallow eval today revealed mostly functional swallowing abilities when seated fully upright with no immediate s/s of aspiration. Pt did present with a throat clear and subsequent weak cough much delayed after the last bite of applesauce. Pt needed cues to hold her head upright rather than lay it back with airway more open and susceptible to aspiration. She tolerated several sips of thin liquid by cup and by straw without difficulty. Oral transit delay with solids with mild oral residue after the swallow. Rec Dys 3 diet with thin liquids. Discussed aspiration precautions with  daughter. ST will follow up with toleration of diet and alter as needed. Pt is being moved to a room today, will post precautions in the room. SLP Visit Diagnosis: Dysphagia, oropharyngeal phase (R13.12)    Aspiration Risk  Mild aspiration risk    Diet Recommendation Dysphagia 3 (Mech soft)   Liquid Administration via: Cup;Straw Medication Administration: Whole meds with puree Supervision: Patient able to self feed;Intermittent supervision to cue for compensatory strategies Compensations: Slow rate;Small sips/bites Postural Changes: Seated upright at 90 degrees;Remain upright for at least 30 minutes after po intake;Other (Comment) (Make sure head is not tilted back)    Other  Recommendations   PT/OT following  Follow up Recommendations   F/u with toleration of diet     Frequency and Duration min 2x/week  1 week       Prognosis Prognosis for Safe Diet Advancement: Good      Swallow Study   General Date of Onset: 05/28/20 HPI: Per admitting H&P "DEBBY CLYNE is a 87 y.o. 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 Patient states that for one week she had a cough and generally feeling somewhat unwell. She states last pm while attempting to get in bed she felt weak and fell. She noted that she his her head and had pain to left hip. She states she was unable to get up. She did not call out. She thought that if she rested she would be able to gather her strength and get in bed. She was found by her family early in the am on the floor. Per daughter she was note to cold. Per her daughter she had been somewhat confused different from her usual baseline. Patient denies any fever/ chills/ chest pain, nausea/vomitting/sorethroat or muscle aches. She also denies diarrhea currently but notes she has constipation at times. She notes no  significant shortness of breath. She does have complaint of bladder spasm which are intermittent and intense.She notes she does have significant symptoms related to her OAB" Type of Study: Bedside Swallow Evaluation Diet Prior to this Study: Regular History of Recent Intubation: No Behavior/Cognition: Alert;Cooperative;Pleasant mood;Confused Oral Cavity Assessment: Within Functional Limits Oral Cavity - Dentition: Adequate natural dentition Self-Feeding Abilities: Needs assist;Needs set up Patient Positioning: Upright in bed Baseline Vocal Quality: Normal Volitional Cough: Strong    Oral/Motor/Sensory Function Overall Oral Motor/Sensory Function: Within functional limits   Ice Chips Ice chips: Within functional limits   Thin Liquid Thin Liquid: Within functional limits Presentation: Cup;Straw    Nectar Thick Nectar Thick Liquid: Not tested   Honey Thick Honey Thick Liquid: Not tested   Puree Puree: Within functional limits Presentation: Spoon   Solid     Solid: Impaired Presentation: Self Fed Oral Phase Impairments: Impaired mastication Oral  Phase Functional Implications: Prolonged oral transit;Impaired mastication      Lucila Maine 05/29/2020,11:15 AM

## 2020-05-29 NOTE — Progress Notes (Signed)
In to give patient ranexa tolerated medication administration well. Daughter at bedside.  Patient c/o bilateral hip pain that she rates at 10/10 describes as sharp.  Received tylenol earlier without relief.  Notified dr. Leslye Peer of the above and awaiting new orders for pain med. o2 sats 97% on room air at this time. Ate 100% of breakfast tray

## 2020-05-29 NOTE — Progress Notes (Signed)
Received patient from ER, daughter at bedside, due to fall risk, requests all 4 side rails to be raised-- educated daughter that it is considered a restraint and she acknowledged it and requested it anyway's. Rails raised per daughters consent. MD aware.

## 2020-05-29 NOTE — Progress Notes (Signed)
Patient ID: Monique Burns, female   DOB: May 19, 1930, 87 y.o.   MRN: [id number] [facility] PROGRESS NOTE  Monique Burns YQM:[id number] DOB: 04/19/1931 DOA: 05/28/2020 PCP: Adin Hector, MD  HPI/Subjective: Patient having a lot of falls recently.  Fall versus passing out episode yesterday.  Some pain in her left hip.  Also has pain in her abdomen when trying to urinate.  They placed a Foley catheter yesterday.  Patient was on the ground overnight.  Had some trouble swallowing the other day.  Not feeling well.  Having some cough and found to have pneumonia.  Objective: Vitals:   05/29/20 0930 05/29/20 1000  BP: 135/65 (!) 133/48  Pulse: 69 67  Resp:  16  Temp:    SpO2:  98%    Intake/Output Summary (Last 24 hours) at 05/29/2020 1109 Last data filed at 05/28/2020 1537 Gross per 24 hour  Intake 500 ml  Output --  Net 500 ml   Filed Weights   05/28/20 0948  Weight: 52.2 kg    ROS: Review of Systems  Respiratory: Positive for cough and shortness of breath.   Cardiovascular: Negative for chest pain.  Gastrointestinal: Negative for abdominal pain, nausea and vomiting.  Musculoskeletal: Positive for joint pain.   Exam: Physical Exam HENT:     Head: Normocephalic.     Mouth/Throat:     Pharynx: No oropharyngeal exudate.  Eyes:     General: Lids are normal.     Conjunctiva/sclera: Conjunctivae normal.     Pupils: Pupils are equal, round, and reactive to light.  Cardiovascular:     Rate and Rhythm: Normal rate and regular rhythm.     Heart sounds: Normal heart sounds, S1 normal and S2 normal.  Pulmonary:     Breath sounds: No decreased breath sounds, wheezing, rhonchi or rales.  Abdominal:     Palpations: Abdomen is soft.     Tenderness: There is no abdominal tenderness.  Musculoskeletal:     Right lower leg: No swelling.     Left lower leg: No swelling.     Comments: Left hip pain to palpation over joint but able to move the extremity.  Skin:    General:  Skin is warm.     Findings: No rash.  Neurological:     Mental Status: She is alert.     Comments: Answers some questions appropriately.  Able to straight leg raise bilaterally.       Data Reviewed: Basic Metabolic Panel: Recent Labs  Lab 05/28/20 1004 05/29/20 0500  NA 135 136  K 4.5 4.1  CL 104 104  CO2 19* 19*  GLUCOSE 128* 109*  BUN 39* 30*  CREATININE 1.38* 1.30*  CALCIUM 9.7 9.4   Liver Function Tests: Recent Labs  Lab 05/29/20 0500  AST 39  ALT 18  ALKPHOS 60  BILITOT 1.2  PROT 6.9  ALBUMIN 3.9   CBC: Recent Labs  Lab 05/28/20 1004 05/29/20 0500  WBC 7.5 8.0  HGB 10.9* 9.6*  HCT 33.4* 30.4*  MCV 100.0 101.3*  PLT 238 224   Cardiac Enzymes: Recent Labs  Lab 05/29/20 0500  CKTOTAL 370*     Recent Results (from the past 240 hour(s))  Blood culture (single)     Status: None (Preliminary result)   Collection Time: 05/28/20  1:33 PM   Specimen: BLOOD  Result Value Ref Range Status   Specimen Description BLOOD RIGHT ANTECUBITAL  Final   Special Requests   Final  BOTTLES DRAWN AEROBIC AND ANAEROBIC Blood Culture adequate volume   Culture   Final    NO GROWTH < 24 HOURS Performed at [facility], [address] [place], [place] [address]    Report Status PENDING  Incomplete  SARS CORONAVIRUS 2 (TAT 6-24 HRS) Nasopharyngeal Nasopharyngeal Swab     Status: None   Collection Time: 05/28/20  2:38 PM   Specimen: Nasopharyngeal Swab  Result Value Ref Range Status   SARS Coronavirus 2 NEGATIVE NEGATIVE Final    Comment: (NOTE) SARS-CoV-2 target nucleic acids are NOT DETECTED.  The SARS-CoV-2 RNA is generally detectable in upper and lower respiratory specimens during the acute phase of infection. Negative results do not preclude SARS-CoV-2 infection, do not rule out co-infections with other pathogens, and should not be used as the sole basis for treatment or other patient management decisions. Negative results must be combined with  clinical observations, patient history, and epidemiological information. The expected result is Negative.  Fact Sheet for Patients: SugarRoll.be  Fact Sheet for Healthcare Providers: https://www.woods-mathews.com/  This test is not yet approved or cleared by the [place] FDA and  has been authorized for detection and/or diagnosis of SARS-CoV-2 by FDA under an Emergency Use Authorization (EUA). This EUA will remain  in effect (meaning this test can be used) for the duration of the COVID-19 declaration under Se ction 564(b)(1) of the Act, 21 U.S.C. section 360bbb-3(b)(1), unless the authorization is terminated or revoked sooner.  Performed at [facility], [place] [address] [place], [place] [address]   Culture, blood (routine x 2) Call MD if unable to obtain prior to antibiotics being given     Status: None (Preliminary result)   Collection Time: 05/28/20  8:02 PM   Specimen: BLOOD  Result Value Ref Range Status   Specimen Description BLOOD BLOOD RIGHT FOREARM  Final   Special Requests   Final    BOTTLES DRAWN AEROBIC AND ANAEROBIC Blood Culture adequate volume   Culture   Final    NO GROWTH < 12 HOURS Performed at [facility], [address], [place], [place] [address]    Report Status PENDING  Incomplete  Culture, blood (routine x 2) Call MD if unable to obtain prior to antibiotics being given     Status: None (Preliminary result)   Collection Time: 05/28/20  8:09 PM   Specimen: BLOOD  Result Value Ref Range Status   Specimen Description BLOOD RIGHT ANTECUBITAL  Final   Special Requests   Final    BOTTLES DRAWN AEROBIC AND ANAEROBIC Blood Culture adequate volume   Culture   Final    NO GROWTH < 12 HOURS Performed at [facility], [address] [place], [place] [address]    Report Status PENDING  Incomplete     Studies: DG Chest 2 View  Result Date: 05/28/2020 CLINICAL DATA:  Shortness of breath  EXAM: CHEST - 2 VIEW COMPARISON:  February 09, 2020 FINDINGS: There is airspace opacity throughout much of the left upper lobe without significant consolidation. Lungs elsewhere clear. Heart size and pulmonary vascularity are normal. Patient is status post coronary artery bypass grafting. No adenopathy. There is aortic atherosclerosis. No bone lesions. IMPRESSION: Airspace opacity consistent with pneumonia left upper lobe. Question atypical organism pneumonia given this appearance. Advise check of COVID-19 status. Lungs elsewhere clear. Status post coronary artery bypass grafting. Heart size normal. No adenopathy. Aortic Atherosclerosis (ICD10-I70.0). Electronically Signed   By: Lowella Grip III M.D.  On: 05/28/2020 10:34   CT Head Wo Contrast  Result Date: 05/28/2020 CLINICAL DATA:  Pain following fall EXAM: CT HEAD WITHOUT CONTRAST TECHNIQUE: Contiguous axial images were obtained from the base of the skull through the vertex without intravenous contrast. COMPARISON:  December 31, 2019 FINDINGS: Brain: There is stable age related volume loss. There is no intracranial mass, hemorrhage, extra-axial fluid collection, or midline shift. There is patchy small vessel disease in the centra semiovale bilaterally, stable. No acute infarct is evident. Vascular: No hyperdense vessel. Foci of calcification noted in the distal left vertebral artery and in each carotid siphon region. Skull: The bony calvarium appears intact. Sinuses/Orbits: There is mucosal thickening in multiple ethmoid air cells. Orbits appear symmetric bilaterally. Other: Visualized mastoid air cells are clear. IMPRESSION: Age related volume loss with stable patchy periventricular small vessel disease. No acute infarct. No mass or hemorrhage. There are foci of arterial vascular calcification. There is mucosal thickening in several ethmoid air cells. Electronically Signed   By: Lowella Grip III M.D.   On: 05/28/2020 10:36   CT ABDOMEN PELVIS W  CONTRAST  Result Date: 05/28/2020 CLINICAL DATA:  Recent fall with abdominal pain, initial encounter EXAM: CT ABDOMEN AND PELVIS WITH CONTRAST TECHNIQUE: Multidetector CT imaging of the abdomen and pelvis was performed using the standard protocol following bolus administration of intravenous contrast. CONTRAST:  82mL OMNIPAQUE IOHEXOL 300 MG/ML  SOLN COMPARISON:  Chest x-ray from earlier in the same day. FINDINGS: Lower chest: Small bilateral pleural effusions are noted. Bibasilar atelectatic changes are again seen similar to that noted on the prior plain film of the chest. Loculated fluid is noted adjacent to the distal descending thoracic aorta consistent with small loculated effusion. Hepatobiliary: Gallbladder is well distended although no cholelithiasis is seen. The liver is within normal limits. Pancreas: Unremarkable. No pancreatic ductal dilatation or surrounding inflammatory changes. Spleen: Normal in size without focal abnormality. Adrenals/Urinary Tract: Adrenal glands are within normal limits. Right kidney demonstrates no mass lesion or hydronephrosis. Small lower pole cyst is noted. Left kidney demonstrates exophytic cyst from the upper pole measuring 5 cm. Smaller cysts are noted. No renal calculi or obstructive changes are noted. The bladder is well distended. Stomach/Bowel: Diverticular change of the colon is noted without evidence of definitive diverticulitis. Considerable retained fecal material is noted throughout the colon consistent with at least a mild degree of constipation. The appendix is not well visualized and may have been surgically removed. No inflammatory changes are seen. The stomach is within normal limits. No small bowel abnormality is seen. Vascular/Lymphatic: Aortic atherosclerosis. No enlarged abdominal or pelvic lymph nodes. Reproductive: Uterus has been surgically removed. Cystic changes are noted in the right ovary. These appear simple in nature. The largest of these  measures 3.1 Cm in dimension. Other: No abdominal wall hernia or abnormality. No abdominopelvic ascites. Musculoskeletal: Bilateral hip replacements are seen. No compression deformities are noted. Mild degenerative changes of lumbar spine are noted. IMPRESSION: Small bilateral pleural effusions with bibasilar atelectatic changes similar to that seen on prior chest x-ray. Bilateral renal cysts simple in nature. Diverticulosis without diverticulitis. Cystic changes in the right ovary. The uterus has been surgically removed. Recommend follow-up US in 6-12 months. Note: This recommendation does not apply to premenarchal patients and to those with increased risk (genetic, family history, elevated tumor markers or other high-risk factors) of ovarian cancer. Reference: JACR 2020 Feb; 17(2):248-254 Electronically Signed   By: Inez Catalina M.D.   On: 05/28/2020 15:19   US  Venous Img Lower Bilateral (DVT)  Result Date: 05/29/2020 CLINICAL DATA:  Elevated D-dimer, lower extremity pain and edema EXAM: BILATERAL LOWER EXTREMITY VENOUS DOPPLER ULTRASOUND TECHNIQUE: Gray-scale sonography with graded compression, as well as color Doppler and duplex ultrasound were performed to evaluate the lower extremity deep venous systems from the level of the common femoral vein and including the common femoral, femoral, profunda femoral, popliteal and calf veins including the posterior tibial, peroneal and gastrocnemius veins when visible. The superficial great saphenous vein was also interrogated. Spectral Doppler was utilized to evaluate flow at rest and with distal augmentation maneuvers in the common femoral, femoral and popliteal veins. COMPARISON:  None. FINDINGS: RIGHT LOWER EXTREMITY Common Femoral Vein: No evidence of thrombus. Normal compressibility, respiratory phasicity and response to augmentation. Saphenofemoral Junction: No evidence of thrombus. Normal compressibility and flow on color Doppler imaging. Profunda Femoral Vein:  No evidence of thrombus. Normal compressibility and flow on color Doppler imaging. Femoral Vein: No evidence of thrombus. Normal compressibility, respiratory phasicity and response to augmentation. Popliteal Vein: No evidence of thrombus. Normal compressibility, respiratory phasicity and response to augmentation. Calf Veins: No evidence of thrombus. Normal compressibility and flow on color Doppler imaging. LEFT LOWER EXTREMITY Common Femoral Vein: No evidence of thrombus. Normal compressibility, respiratory phasicity and response to augmentation. Saphenofemoral Junction: No evidence of thrombus. Normal compressibility and flow on color Doppler imaging. Profunda Femoral Vein: No evidence of thrombus. Normal compressibility and flow on color Doppler imaging. Femoral Vein: No evidence of thrombus. Normal compressibility, respiratory phasicity and response to augmentation. Popliteal Vein: No evidence of thrombus. Normal compressibility, respiratory phasicity and response to augmentation. Calf Veins: No evidence of thrombus. Normal compressibility and flow on color Doppler imaging. IMPRESSION: No evidence of deep venous thrombosis in either lower extremity. Electronically Signed   By: Jerilynn Mages.  Shick M.D.   On: 05/29/2020 09:55   DG Hip Unilat W or Wo Pelvis 2-3 Views Left  Result Date: 05/28/2020 CLINICAL DATA:  Left hip pain after fall. EXAM: DG HIP (WITH OR WITHOUT PELVIS) 2-3V LEFT COMPARISON:  None. FINDINGS: Status post bilateral total hip arthroplasties. No fracture or dislocation is noted. Vascular calcifications are noted. IMPRESSION: No acute abnormality seen in the left hip. Electronically Signed   By: Marijo Conception M.D.   On: 05/28/2020 13:56    Scheduled Meds: . atorvastatin  80 mg Oral QPM  . DULoxetine  30 mg Oral QPM  . enoxaparin (LOVENOX) injection  30 mg Subcutaneous Q24H  . lamoTRIgine  25 mg Oral QPM  . metoprolol succinate  25 mg Oral QPM  . ranolazine  1,000 mg Oral BID  . Vibegron  75 mg  Oral Daily   Continuous Infusions: . sodium chloride 40 mL/hr at 05/29/20 0949  . levofloxacin (LEVAQUIN) IV      Assessment/Plan:  1. Left upper lobar pneumonia.  Patient started on Levaquin by admitting physician.  Currently on 2 L of oxygen.  Check pulse ox on room air in a.m. 2. Acute metabolic encephalopathy seems better today.  Give IV fluids and antibiotics. 3. Frequent falls and possibility of syncope.  MRI brain.  CPK added on and is not that high continue IV fluids.  Physical therapy evaluation. 4. Elevated D-dimer ultrasound the lower extremities negative.  Since patient not having respiratory distress can probably hold off on further imaging.  Recheck D-dimer tomorrow. 5. Anemia.  Check iron studies to further clarify. 6. Hyperlipidemia unspecified on atorvastatin 7. Essential hypertension on metoprolol 8. Chronic kidney disease  stage IIIb 9. History of diastolic congestive heart failure.  No signs of heart failure currently monitor closely with IV fluids. 10. Left hip pain.  X-ray negative for fracture.  Ultrasound negative for DVT.  Physical therapy evaluation.  Likely bursitis from fall. 11. Foley catheter placed because she could not get up and she was confused yesterday.  We will hopefully be able to take out tomorrow.        Code Status:     Code Status Orders  (From admission, onward)         Start     Ordered   05/28/20 1926  Full code  Continuous        05/28/20 1929        Code Status History    Date Active Date Inactive Code Status Order ID Comments User Context   12/20/2017 0936 12/21/2017 2015 Full Code [id number]  Gladstone Lighter, MD Inpatient   12/08/2015 1539 12/08/2015 1938 Full Code [id number]  Yolonda Kida, MD Inpatient   11/26/2015 1101 11/27/2015 1747 Full Code [id number]  Dustin Flock, MD ED   Advance Care Planning Activity     Family Communication: Daughter at the bedside Disposition Plan: Status is: Inpatient  Dispo: The patient  is from: Home              Anticipated d/c is to: Home              Anticipated d/c date is: Reevaluate on a daily basis.  Need to see how she does with physical therapy              Patient currently being treated for pneumonia and acute metabolic encephalopathy    Time spent: 30 minutes  [place]

## 2020-05-29 NOTE — ED Notes (Signed)
Messaged pharmacy to send Ranexa

## 2020-05-29 NOTE — Evaluation (Signed)
Physical Therapy Evaluation Patient Details Name: Monique Burns MRN: [id number] DOB: 11-Jan-1931 Today's Date: 05/29/2020   History of Present Illness  Pt is an 87yo F admitted to [facility] on 05/28/20 for fall that resulted in R hematoma of head, and L hip/foot pain. Daughter endorses increased confusion and generalized weakness over the past week. CXR revealed LUL infiltrate concerning for community acquired PNA, brain MRI revealed remote R temporal insult, and L hip XR negative for fx. Significant PMH includes: CAD s/p MI s/p CABG, HLD, HTN, CKD (III), OAB, and sz. Pt with possible reaction to rocephin, stating that she feels like she's drowning, requiring 2L O2 via [place].    Clinical Impression  Pt is a 87 year old F admitted to hospital on 05/28/20 for fall at home. Pt only oriented to self and unable to answer majority of PT questions, therefore, PLOF obtained from daughter, [name], at bedside. Per daughter, pt with progressive confusion and hx of multiple falls over the past 88yrs. At baseline, pt is mod I for easy meals, toileting, "very short" household ambulation with rollator/SPC and dressing (sometimes requiring PRN assistance); pt needs assistance for dressing, bathing, and other IADL's. Pt has been active with HHPT for 10mo, working with PT 2x/wk for strengthening. Pt presents with generalized weakness, confusion, decreased gross balance, poor safety awareness, impulsivity, and decreased activity tolerance resulting in impaired functional mobility from baseline. Due to deficits, pt required min-mod assist for bed mobility, mod assist for transfers with RW, and was unable to participate in gait. Due to cognition pt required increased multimodal cueing during session for sequencing and safety; however, pt demonstrated improved mentation post session, as she was able to answer questions more clearly. Deficits limit the pt's ability to safely and independently perform ADL's, transfer, and ambulate. Pt will  benefit from acute skilled PT services to address deficits for return to baseline function. At this time, PT recommends SNF at [place] with 24/7 care; daughter agreeable.     Follow Up Recommendations SNF;Supervision/Assistance - 24 hour    Equipment Recommendations  Other (comment) (Defer to post acute facility)    Recommendations for Other Services       Precautions / Restrictions Precautions Precautions: Fall Restrictions Weight Bearing Restrictions: No      Mobility  Bed Mobility Overal bed mobility: Needs Assistance Bed Mobility: Supine to Sit;Sit to Supine;Rolling Rolling: Mod assist   Supine to sit: HOB elevated;Mod assist Sit to supine: Min assist   General bed mobility comments: Mod assist to sit EOB with HOB elevated; min assist for BLE facilitation onto bed to lie supine. Mod assist for rolling for linen adjustment. Max cueing for safety/sequencing.    Transfers Overall transfer level: Needs assistance   Transfers: Sit to/from Stand Sit to Stand: Mod assist;From elevated surface         General transfer comment: Mod assist to stand from elevated bed height x2 with RW. Max cueing for safety/sequencing.  Ambulation/Gait Ambulation/Gait assistance: Mod assist   Assistive device: Rolling walker (2 wheeled)       General Gait Details: Mod assist for maintenance of upright posture. Attempted ambulation at bedside for repositioning. Pt able to step forward with LLE with minimal WB, but then stepped back to starting position. Pt unable to perform lateral step.     Balance Overall balance assessment: Needs assistance Sitting-balance support: Bilateral upper extremity supported;Feet supported Sitting balance-Leahy Scale: Fair Sitting balance - Comments: Initial poor seated balance at EOB requiring max cueing for UE  support. Progressed to fair.   Standing balance support: Bilateral upper extremity supported;During functional activity Standing balance-Leahy Scale:  Poor Standing balance comment: Pt required mod assist for maintenance of upright posture in RW; pt demonstrated increased hip flexion and wide BOS with BLE outside BOS                             Pertinent Vitals/Pain Pain Assessment: No/denies pain    Home Living Family/patient expects to be discharged to:: Private residence Living Arrangements:  (pt lives along, but grandson and daughter have been staying with pt since pt's spouse passed away 54mo ago) Available Help at Discharge: Family;Available 24 hours/day (daughter and grandson available for 24/7 care) Type of Home: House         Home Equipment: Walker - 4 wheels;Cane - single point;Shower seat;Toilet riser      Prior Function Level of Independence: Needs assistance   Gait / Transfers Assistance Needed: Mod I with rollator (75%) and SPC (25%); short household distances with intermittent supervision. Hx of multiple falls over past 2 years  ADL's / Homemaking Assistance Needed: Mod I for easy meals, PRN assistance with dressing, mod I for toileting, assistance for bathing; assistance for all other IADL's        Hand Dominance        Extremity/Trunk Assessment   Upper Extremity Assessment Upper Extremity Assessment: Generalized weakness;Difficult to assess due to impaired cognition (Observed to be at least 3/5; PROM [facility])    Lower Extremity Assessment Lower Extremity Assessment: Generalized weakness;Difficult to assess due to impaired cognition (observed to be at least 3+/5 as pt was able to WB through BLE in standing)    Cervical / Trunk Assessment Cervical / Trunk Assessment: Kyphotic  Communication   Communication: No difficulties  Cognition Arousal/Alertness: Awake/alert Behavior During Therapy:  (confused) Overall Cognitive Status: History of cognitive impairments - at baseline                                 General Comments: Daughter at bedside, and notes progressive confusion. Pt  oriented to self and required max cues for DOB, location, and situation. Pt unable to answer most of PT's questions due to confusion, often stating "well good" and "i like that".      General Comments General comments (skin integrity, edema, etc.): hematoma on L dorsal aspect of foot    Exercises Other Exercises Other Exercises: Pt able to participate in bed mobility and transfers with RW with min - mod assist; attempted ambulation but unsuccessful due to weakness/confusion. Due to impaired cognition, pt required increased multimodal cues for sequencing and safety. Other Exercises: Pt and daughter educated regarding: PT role/POC, DC recommendations, and safety with mobility.   Assessment/Plan    PT Assessment Patient needs continued PT services  PT Problem List Decreased strength;Decreased mobility;Decreased safety awareness;Decreased coordination;Decreased activity tolerance;Decreased cognition;Decreased balance       PT Treatment Interventions Gait training;Functional mobility training;Therapeutic activities;Therapeutic exercise;Balance training;Neuromuscular re-education    PT Goals (Current goals can be found in the Care Plan section)  Acute Rehab PT Goals Patient Stated Goal: pt - "to go home", daughter - "to get better" PT Goal Formulation: With patient/family Time For Goal Achievement: 06/12/20 Potential to Achieve Goals: Fair    Frequency Min 2X/week    AM-PAC PT "6 Clicks" Mobility  Outcome Measure Help needed turning from your  back to your side while in a flat bed without using bedrails?: A Lot Help needed moving from lying on your back to sitting on the side of a flat bed without using bedrails?: A Lot Help needed moving to and from a bed to a chair (including a wheelchair)?: Total Help needed standing up from a chair using your arms (e.g., wheelchair or bedside chair)?: A Lot Help needed to walk in hospital room?: Total Help needed climbing 3-5 steps with a railing? :  Total 6 Click Score: 9    End of Session Equipment Utilized During Treatment: Gait belt Activity Tolerance: Patient tolerated treatment well;Patient limited by fatigue Patient left: in bed;with call bell/phone within reach;with bed alarm set;with family/visitor present Nurse Communication: Mobility status PT Visit Diagnosis: Unsteadiness on feet (R26.81);Repeated falls (R29.6);Muscle weakness (generalized) (M62.81);History of falling (Z91.81);Difficulty in walking, not elsewhere classified (R26.2)    Time: 5784-6962 PT Time Calculation (min) (ACUTE ONLY): 30 min   Charges:   PT Evaluation $PT Eval Moderate Complexity: 1 Mod PT Treatments $Therapeutic Activity: 8-22 mins       Herminio Commons, PT, DPT 3:27 PM,05/29/20

## 2020-05-29 NOTE — Progress Notes (Signed)
ivf started ns at 11ml/hr

## 2020-05-29 NOTE — ED Notes (Signed)
Pt took meds as whole pills in applesauce. Pt is poor w/ PO water intake.

## 2020-05-30 DIAGNOSIS — D500 Iron deficiency anemia secondary to blood loss (chronic): Secondary | ICD-10-CM

## 2020-05-30 DIAGNOSIS — M25552 Pain in left hip: Secondary | ICD-10-CM

## 2020-05-30 LAB — CBC
HCT: 25.5 % — ABNORMAL LOW (ref 36.0–46.0)
Hemoglobin: 8.4 g/dL — ABNORMAL LOW (ref 12.0–15.0)
MCH: 32.8 pg (ref 26.0–34.0)
MCHC: 32.9 g/dL (ref 30.0–36.0)
MCV: 99.6 fL (ref 80.0–100.0)
Platelets: 183 10*3/uL (ref 150–400)
RBC: 2.56 MIL/uL — ABNORMAL LOW (ref 3.87–5.11)
RDW: 12.9 % (ref 11.5–15.5)
WBC: 4.8 10*3/uL (ref 4.0–10.5)
nRBC: 0 % (ref 0.0–0.2)

## 2020-05-30 LAB — BASIC METABOLIC PANEL
Anion gap: 10 (ref 5–15)
BUN: 36 mg/dL — ABNORMAL HIGH (ref 8–23)
CO2: 20 mmol/L — ABNORMAL LOW (ref 22–32)
Calcium: 8.8 mg/dL — ABNORMAL LOW (ref 8.9–10.3)
Chloride: 108 mmol/L (ref 98–111)
Creatinine, Ser: 1.48 mg/dL — ABNORMAL HIGH (ref 0.44–1.00)
GFR, Estimated: 34 mL/min — ABNORMAL LOW (ref >60)
Glucose, Bld: 96 mg/dL (ref 70–99)
Potassium: 4.1 mmol/L (ref 3.5–5.1)
Sodium: 138 mmol/L (ref 135–145)

## 2020-05-30 LAB — IRON AND TIBC
Iron: 25 ug/dL — ABNORMAL LOW (ref 28–170)
Saturation Ratios: 8 % — ABNORMAL LOW (ref 10.4–31.8)
TIBC: 323 ug/dL (ref 250–450)
UIBC: 298 ug/dL

## 2020-05-30 LAB — URINE CULTURE: Culture: NO GROWTH

## 2020-05-30 LAB — FERRITIN: Ferritin: 58 ng/mL (ref 11–307)

## 2020-05-30 LAB — FIBRIN DERIVATIVES D-DIMER (ARMC ONLY): Fibrin derivatives D-dimer (ARMC): 1702.43 ng/mL (FEU) — ABNORMAL HIGH (ref 0.00–499.00)

## 2020-05-30 LAB — VITAMIN B12: Vitamin B-12: 324 pg/mL (ref 180–914)

## 2020-05-30 MED ORDER — FERROUS SULFATE 325 (65 FE) MG PO TABS
325.0000 mg | ORAL_TABLET | Freq: Every day | ORAL | Status: DC
  Administered 2020-05-31 – 2020-06-01 (×2): 325 mg via ORAL
  Filled 2020-05-30 (×2): qty 1

## 2020-05-30 MED ORDER — TRAZODONE HCL 50 MG PO TABS
25.0000 mg | ORAL_TABLET | Freq: Every evening | ORAL | Status: DC | PRN
  Administered 2020-05-30: 25 mg via ORAL
  Filled 2020-05-30: qty 1

## 2020-05-30 NOTE — NC FL2 (Signed)
[place] LEVEL OF CARE SCREENING TOOL     IDENTIFICATION  Patient Name: Monique Burns Birthdate: 06/21/1930 Sex: female Admission Date (Current Location): 05/28/2020  [place] and [place] Number:  Engineering geologist and Address:  [facility], [address] [place], [place] [address]      Provider Number: [id number]  Attending Physician Name and Address:  Loletha Grayer, MD  Relative Name and Phone Number:  [name] [phone]    Current Level of Care: Hospital Recommended Level of Care: [place] Prior Approval Number:    Date Approved/Denied:   PASRR Number: [id number]  Discharge Plan: SNF    Current Diagnoses: Patient Active Problem List   Diagnosis Date Noted  . Left hip pain   . Lobar pneumonia ([place])   . Acute metabolic encephalopathy   . Falls frequently   . Elevated d-dimer   . Anemia   . CAP (community acquired pneumonia) 05/28/2020  . Overactive bladder 04/08/2020  . Urge incontinence 04/08/2020  . Nocturia 04/08/2020  . Unstable angina ([place]) 12/20/2017  . Protein-calorie malnutrition, severe 11/27/2015  . Chest pain 11/26/2015  . Chronic kidney disease 04/05/2015  . HLD (hyperlipidemia) 04/05/2015  . Essential hypertension 04/05/2015  . Decreased leukocytes 04/05/2015  . Arthritis, degenerative 04/05/2015  . Disease of thyroid gland 04/05/2015  . Acute myocardial infarction of anterior wall ([place]) 02/28/2015  . Degenerative arthritis of hip 10/31/2013    Orientation RESPIRATION BLADDER Height & Weight     Self,Place  O2 (2L) Incontinent Weight: 123 lb (55.8 kg) Height:  5\' 7"  (170.2 cm)  BEHAVIORAL SYMPTOMS/MOOD NEUROLOGICAL BOWEL NUTRITION STATUS      Continent Diet  AMBULATORY STATUS COMMUNICATION OF NEEDS Skin   Extensive Assist Verbally                         Personal Care Assistance Level of Assistance  Bathing,Feeding,Dressing Bathing Assistance: Limited  assistance Feeding assistance: Limited assistance Dressing Assistance: Maximum assistance     Functional Limitations Info  Sight,Hearing,Speech Sight Info: Adequate Hearing Info: Adequate Speech Info: Adequate    SPECIAL CARE FACTORS FREQUENCY  PT (By licensed PT),OT (By licensed OT)     PT Frequency: 5x week OT Frequency: 5x week            Contractures Contractures Info: Not present    Additional Factors Info  Code Status Code Status Info: Full             Current Medications (05/30/2020):  This is the current hospital active medication list Current Facility-Administered Medications  Medication Dose Route Frequency Provider Last Rate Last Admin  . acetaminophen (TYLENOL) tablet 500-1,000 mg  500-1,000 mg Oral Q6H PRN Clance Boll, MD   500 mg at 05/29/20 2139  . Chlorhexidine Gluconate Cloth 2 % PADS 6 each  6 each Topical Daily Loletha Grayer, MD   6 each at 05/29/20 1146  . cloNIDine (CATAPRES) tablet 0.1 mg  0.1 mg Oral BID PRN Myles Rosenthal A, MD      . DULoxetine (CYMBALTA) DR capsule 30 mg  30 mg Oral QPM Myles Rosenthal A, MD   30 mg at 05/29/20 1702  . enoxaparin (LOVENOX) injection 30 mg  30 mg Subcutaneous Q24H Myles Rosenthal A, MD   30 mg at 05/29/20 2139  . [START ON 05/31/2020] ferrous sulfate tablet 325 mg  325 mg Oral Q breakfast Loletha Grayer, MD      . lamoTRIgine (  LAMICTAL) tablet 25 mg  25 mg Oral QPM Myles Rosenthal A, MD   25 mg at 05/29/20 1702  . levofloxacin (LEVAQUIN) IVPB 750 mg  750 mg Intravenous Q48H Myles Rosenthal A, MD 100 mL/hr at 05/29/20 1736 750 mg at 05/29/20 1736  . metoprolol succinate (TOPROL-XL) 24 hr tablet 25 mg  25 mg Oral QPM Myles Rosenthal A, MD   25 mg at 05/29/20 1702  . nitroGLYCERIN (NITROSTAT) SL tablet 0.4 mg  0.4 mg Sublingual Q5 Min x 3 PRN Myles Rosenthal A, MD      . oxyCODONE (Oxy IR/ROXICODONE) immediate release tablet 2.5 mg  2.5 mg Oral Q6H PRN Wieting, Richard, MD      . oxyCODONE  (Oxy IR/ROXICODONE) immediate release tablet 5 mg  5 mg Oral Q6H PRN Loletha Grayer, MD   5 mg at 05/29/20 1933  . ranolazine (RANEXA) 12 hr tablet 1,000 mg  1,000 mg Oral BID Myles Rosenthal A, MD   1,000 mg at 05/30/20 0856  . traZODone (DESYREL) tablet 25 mg  25 mg Oral QHS PRN Loletha Grayer, MD      . Vibegron TABS 75 mg  75 mg Oral Daily Clance Boll, MD         Discharge Medications: Please see discharge summary for a list of discharge medications.  Relevant Imaging Results:  Relevant Lab Results:   Additional Information SS: [id number]  Boris Sharper, LCSW

## 2020-05-30 NOTE — Progress Notes (Signed)
Patient having trouble maintaining oxygen level above 90 on RA. Patient placed on 2L [place]. Will continue to monitor.

## 2020-05-30 NOTE — Evaluation (Signed)
Occupational Therapy Evaluation Patient Details Name: Monique Burns MRN: [id number] DOB: 1930/12/26 Today's Date: 05/30/2020    History of Present Illness Pt is an 87yo F admitted to [facility] on 05/28/20 for fall that resulted in R hematoma of head, and L hip/foot pain. Daughter endorses increased confusion and generalized weakness over the past week. CXR revealed LUL infiltrate concerning for community acquired PNA, brain MRI revealed remote R temporal insult, and L hip XR negative for fx. Significant PMH includes: CAD s/p MI s/p CABG, HLD, HTN, CKD (III), OAB, and sz. Pt with possible reaction to rocephin, stating that she feels like she's drowning, requiring 2L O2 via [place].   Clinical Impression   Monique Burns was seen for OT evaluation this date. Prior to hospital admission, pt was MOD I for household mobility and ADLs. Pt lives alone with daughter/grandson recently staying with her to provide supervision/PRN assist. Pt presents to acute OT demonstrating impaired ADL performance and functional mobility 2/2 decreased activity tolerance, functional strength/balance deficits, and poor insight into deficits. A&Ox2 self and location only.  Upon arrival pt seated in bed, stating she can hear her daughter in hallway and requesting to talk to her. OT reported pt is hearing nursing voices and offered to call DTR by phone - pt assisted with calling and talking to daughter at start of session. Pt requesting to toilet - requires MOD A for BSC t/f, did not have any production but required MAX A static standing balance. SUPERVISION hair brushing seated EOB. Pt would benefit from skilled OT to address noted impairments and functional limitations (see below for any additional details) in order to maximize safety and independence while minimizing falls risk and caregiver burden. Upon hospital discharge, recommend STR to maximize pt safety and return to PLOF.     Follow Up Recommendations  SNF    Equipment  Recommendations  Other (comment) (TBD)    Recommendations for Other Services       Precautions / Restrictions Precautions Precautions: Fall Restrictions Weight Bearing Restrictions: No      Mobility Bed Mobility Overal bed mobility: Needs Assistance Bed Mobility: Supine to Sit;Sit to Supine     Supine to sit: Min guard Sit to supine: Min guard        Transfers Overall transfer level: Needs assistance Equipment used: 1 person hand held assist Transfers: Stand Pivot Transfers;Sit to/from Stand Sit to Stand: Mod assist Stand pivot transfers: Mod assist            Balance Overall balance assessment: Needs assistance Sitting-balance support: No upper extremity supported;Feet supported Sitting balance-Leahy Scale: Good     Standing balance support: Bilateral upper extremity supported Standing balance-Leahy Scale: Poor                             ADL either performed or assessed with clinical judgement   ADL Overall ADL's : Needs assistance/impaired                                       General ADL Comments: MOD A for BSC t/f - MAX A perihygiene in standing. SUPERVISION hair brushing seated EOB.                  Pertinent Vitals/Pain Pain Assessment: No/denies pain     Hand Dominance Right   Extremity/Trunk Assessment Upper Extremity Assessment Upper  Extremity Assessment: Generalized weakness   Lower Extremity Assessment Lower Extremity Assessment: Generalized weakness       Communication Communication Communication: No difficulties   Cognition Arousal/Alertness: Awake/alert Behavior During Therapy: Anxious Overall Cognitive Status: History of cognitive impairments - at baseline                                 General Comments: Pt confused - repeatedly stating she hears her DTR's voice outside (RN's talking) despite OT calling her DTR who is not at hospital yet   General Comments       Exercises  Exercises: Other exercises Other Exercises Other Exercises: Pt educated re: OT role, DME recs, d/c recs, falls prevention, ECS, re-orientation Other Exercises: LBD, grooming, toileting, sup<>sit, sit<>stand, SPT   Shoulder Instructions      Home Living Family/patient expects to be discharged to:: Private residence Living Arrangements:  (pt lives alone, but grandson and daughter have been staying with pt since pt's spouse passed away 4mo ago) Available Help at Discharge: Family;Available 24 hours/day Type of Home: House             Bathroom Shower/Tub: Teacher, early years/pre: Standard Bathroom Accessibility: Yes   Home Equipment: Environmental consultant - 4 wheels;Cane - single point;Shower seat;Toilet riser          Prior Functioning/Environment Level of Independence: Needs assistance  Gait / Transfers Assistance Needed: Mod I with rollator (75%) and SPC (25%); short household distances with intermittent supervision. Hx of multiple falls over past 2 years ADL's / Homemaking Assistance Needed: Mod I for easy meals, PRN assistance with dressing, mod I for toileting, assistance for bathing; assistance for all other IADL's            OT Problem List: Decreased strength;Decreased range of motion;Decreased activity tolerance;Impaired balance (sitting and/or standing);Decreased safety awareness;Decreased knowledge of use of DME or AE      OT Treatment/Interventions: Self-care/ADL training;Therapeutic exercise;Energy conservation;Therapeutic activities;DME and/or AE instruction;Patient/family education;Balance training    OT Goals(Current goals can be found in the care plan section) Acute Rehab OT Goals Patient Stated Goal: To get better OT Goal Formulation: With patient Time For Goal Achievement: 06/13/20 Potential to Achieve Goals: Good ADL Goals Pt Will Perform Grooming: with modified independence;standing (c LRAD PRN) Pt Will Perform Lower Body Dressing: with modified  independence;sit to/from stand (c LRAD PRN) Pt Will Transfer to Toilet: with modified independence;ambulating;regular height toilet (c LRAD PRN)  OT Frequency: Min 1X/week   Barriers to D/C: Decreased caregiver support             AM-PAC OT "6 Clicks" Daily Activity     Outcome Measure Help from another person eating meals?: None Help from another person taking care of personal grooming?: A Little Help from another person toileting, which includes using toliet, bedpan, or urinal?: A Lot Help from another person bathing (including washing, rinsing, drying)?: A Lot Help from another person to put on and taking off regular upper body clothing?: A Little Help from another person to put on and taking off regular lower body clothing?: A Lot 6 Click Score: 16   End of Session Equipment Utilized During Treatment: Oxygen Nurse Communication: Mobility status  Activity Tolerance: Patient tolerated treatment well Patient left: in bed;with call bell/phone within reach;with bed alarm set  OT Visit Diagnosis: Other abnormalities of gait and mobility (R26.89);History of falling (Z91.81)  Time: 0539-7673 OT Time Calculation (min): 31 min Charges:  OT General Charges $OT Visit: 1 Visit OT Evaluation $OT Eval Moderate Complexity: 1 Mod OT Treatments $Self Care/Home Management : 8-22 mins $Therapeutic Activity: 8-22 mins  Dessie Coma, M.S. OTR/L  05/30/20, 1:40 PM  ascom [phone]

## 2020-05-30 NOTE — Progress Notes (Signed)
Patient ID: Monique Burns, female   DOB: Jan 03, 1931, 87 y.o.   MRN: [id number] [facility] PROGRESS NOTE  Monique Burns JME:[id number] DOB: [birth year] DOA: 05/28/2020 PCP: Adin Hector, MD  HPI/Subjective: Patient seen this morning.  She stated that she feels well.  She had had pain in her abdomen in her hip but feeling better today.  She asked me to take the catheter out of the bladder.  Patient interested in going home.  Spoke with the patient's daughter outside the room and she states that the patient's mental status is not back to her baseline. She states that she cannot take care of her at home and was interested in rehab.  Objective: Vitals:   05/30/20 0428 05/30/20 0838  BP:  (!) 131/52  Pulse: 66 61  Resp:  16  Temp:  (!) 97.5 F (36.4 C)  SpO2: 97% 100%    Intake/Output Summary (Last 24 hours) at 05/30/2020 1350 Last data filed at 05/30/2020 1008 Gross per 24 hour  Intake 41.79 ml  Output 400 ml  Net -358.21 ml   Filed Weights   05/28/20 0948 05/30/20 0441  Weight: 52.2 kg 55.8 kg    ROS: Review of Systems  Respiratory: Negative for shortness of breath.   Cardiovascular: Negative for chest pain.  Gastrointestinal: Negative for abdominal pain, nausea and vomiting.   Exam: Physical Exam HENT:     Head: Normocephalic.     Mouth/Throat:     Pharynx: No oropharyngeal exudate.  Eyes:     General: Lids are normal.     Conjunctiva/sclera: Conjunctivae normal.     Pupils: Pupils are equal, round, and reactive to light.  Cardiovascular:     Rate and Rhythm: Normal rate and regular rhythm.     Heart sounds: Normal heart sounds, S1 normal and S2 normal.  Pulmonary:     Breath sounds: No decreased breath sounds, wheezing or rhonchi.  Abdominal:     Palpations: Abdomen is soft.     Tenderness: There is no abdominal tenderness.  Musculoskeletal:     Right lower leg: No swelling.     Left lower leg: No swelling.  Skin:    General: Skin is warm.      Findings: No rash.  Neurological:     Mental Status: She is alert.     Comments: For me she was able to answer questions appropriately.  She is able to straight leg raise.       Data Reviewed: Basic Metabolic Panel: Recent Labs  Lab 05/28/20 1004 05/29/20 0500 05/30/20 0521  NA 135 136 138  K 4.5 4.1 4.1  CL 104 104 108  CO2 19* 19* 20*  GLUCOSE 128* 109* 96  BUN 39* 30* 36*  CREATININE 1.38* 1.30* 1.48*  CALCIUM 9.7 9.4 8.8*   Liver Function Tests: Recent Labs  Lab 05/29/20 0500  AST 39  ALT 18  ALKPHOS 60  BILITOT 1.2  PROT 6.9  ALBUMIN 3.9   CBC: Recent Labs  Lab 05/28/20 1004 05/29/20 0500 05/30/20 0521  WBC 7.5 8.0 4.8  HGB 10.9* 9.6* 8.4*  HCT 33.4* 30.4* 25.5*  MCV 100.0 101.3* 99.6  PLT 238 224 183   Cardiac Enzymes: Recent Labs  Lab 05/29/20 0500  CKTOTAL 370*     Recent Results (from the past 240 hour(s))  Urine culture     Status: None   Collection Time: 05/28/20 11:19 AM   Specimen: Urine, Clean Catch  Result Value Ref Range  Status   Specimen Description   Final    URINE, CLEAN CATCH Performed at [facility], [address] [place], [place] [address]    Special Requests   Final    NONE Performed at [facility], [address], [place], [place] [address]    Culture   Final    NO GROWTH Performed at [facility], [place] [address] [place], [place] [address]    Report Status 05/30/2020 FINAL  Final  Blood culture (single)     Status: None (Preliminary result)   Collection Time: 05/28/20  1:33 PM   Specimen: BLOOD  Result Value Ref Range Status   Specimen Description BLOOD RIGHT ANTECUBITAL  Final   Special Requests   Final    BOTTLES DRAWN AEROBIC AND ANAEROBIC Blood Culture adequate volume   Culture   Final    NO GROWTH 2 DAYS Performed at [facility], [address], [place], [place] [address]    Report Status PENDING  Incomplete  SARS CORONAVIRUS 2 (TAT 6-24 HRS) Nasopharyngeal  Nasopharyngeal Swab     Status: None   Collection Time: 05/28/20  2:38 PM   Specimen: Nasopharyngeal Swab  Result Value Ref Range Status   SARS Coronavirus 2 NEGATIVE NEGATIVE Final    Comment: (NOTE) SARS-CoV-2 target nucleic acids are NOT DETECTED.  The SARS-CoV-2 RNA is generally detectable in upper and lower respiratory specimens during the acute phase of infection. Negative results do not preclude SARS-CoV-2 infection, do not rule out co-infections with other pathogens, and should not be used as the sole basis for treatment or other patient management decisions. Negative results must be combined with clinical observations, patient history, and epidemiological information. The expected result is Negative.  Fact Sheet for Patients: SugarRoll.be  Fact Sheet for Healthcare Providers: https://www.woods-mathews.com/  This test is not yet approved or cleared by the [place] FDA and  has been authorized for detection and/or diagnosis of SARS-CoV-2 by FDA under an Emergency Use Authorization (EUA). This EUA will remain  in effect (meaning this test can be used) for the duration of the COVID-19 declaration under Se ction 564(b)(1) of the Act, 21 U.S.C. section 360bbb-3(b)(1), unless the authorization is terminated or revoked sooner.  Performed at [facility], [place] [address] [place], [place] [address]   Culture, blood (routine x 2) Call MD if unable to obtain prior to antibiotics being given     Status: None (Preliminary result)   Collection Time: 05/28/20  8:02 PM   Specimen: BLOOD  Result Value Ref Range Status   Specimen Description BLOOD BLOOD RIGHT FOREARM  Final   Special Requests   Final    BOTTLES DRAWN AEROBIC AND ANAEROBIC Blood Culture adequate volume   Culture   Final    NO GROWTH 2 DAYS Performed at [facility], [address], [place], [place] [address]    Report Status PENDING  Incomplete  Culture,  blood (routine x 2) Call MD if unable to obtain prior to antibiotics being given     Status: None (Preliminary result)   Collection Time: 05/28/20  8:09 PM   Specimen: BLOOD  Result Value Ref Range Status   Specimen Description BLOOD RIGHT ANTECUBITAL  Final   Special Requests   Final    BOTTLES DRAWN AEROBIC AND ANAEROBIC Blood Culture adequate volume   Culture   Final    NO GROWTH 2 DAYS Performed at [facility], [address] [place], [place]  [address]    Report Status PENDING  Incomplete     Studies: MR BRAIN WO CONTRAST  Result Date: 05/29/2020 CLINICAL DATA:  Neuro deficit, acute, stroke suspected EXAM: MRI HEAD WITHOUT CONTRAST TECHNIQUE: Multiplanar, multiecho pulse sequences of the brain and surrounding structures were obtained without intravenous contrast. COMPARISON:  05/28/2020 and prior. FINDINGS: Please note that image quality is degraded by motion artifact. Brain: No diffusion-weighted signal abnormality. Left cerebral microhemorrhage. No midline shift, ventriculomegaly or extra-axial fluid collection. No mass lesion. Mild cerebral atrophy with ex vacuo dilatation. Moderate chronic microvascular ischemic changes. Sequela of remote right temporal insult. Vascular: Major intracranial flow voids are grossly preserved proximally. Skull and upper cervical spine: No acute finding. Sinuses/Orbits: No acute finding. Other: Incomplete FLAIR suppression. IMPRESSION: No acute intracranial process.  Remote right temporal insult. Mild cerebral atrophy and moderate chronic microvascular ischemic changes. Motion degraded exam. Electronically Signed   By: Primitivo Gauze M.D.   On: 05/29/2020 14:11   CT ABDOMEN PELVIS W CONTRAST  Result Date: 05/28/2020 CLINICAL DATA:  Recent fall with abdominal pain, initial encounter EXAM: CT ABDOMEN AND PELVIS WITH CONTRAST TECHNIQUE: Multidetector CT imaging of the abdomen and pelvis was performed using the standard protocol following bolus  administration of intravenous contrast. CONTRAST:  29mL OMNIPAQUE IOHEXOL 300 MG/ML  SOLN COMPARISON:  Chest x-ray from earlier in the same day. FINDINGS: Lower chest: Small bilateral pleural effusions are noted. Bibasilar atelectatic changes are again seen similar to that noted on the prior plain film of the chest. Loculated fluid is noted adjacent to the distal descending thoracic aorta consistent with small loculated effusion. Hepatobiliary: Gallbladder is well distended although no cholelithiasis is seen. The liver is within normal limits. Pancreas: Unremarkable. No pancreatic ductal dilatation or surrounding inflammatory changes. Spleen: Normal in size without focal abnormality. Adrenals/Urinary Tract: Adrenal glands are within normal limits. Right kidney demonstrates no mass lesion or hydronephrosis. Small lower pole cyst is noted. Left kidney demonstrates exophytic cyst from the upper pole measuring 5 cm. Smaller cysts are noted. No renal calculi or obstructive changes are noted. The bladder is well distended. Stomach/Bowel: Diverticular change of the colon is noted without evidence of definitive diverticulitis. Considerable retained fecal material is noted throughout the colon consistent with at least a mild degree of constipation. The appendix is not well visualized and may have been surgically removed. No inflammatory changes are seen. The stomach is within normal limits. No small bowel abnormality is seen. Vascular/Lymphatic: Aortic atherosclerosis. No enlarged abdominal or pelvic lymph nodes. Reproductive: Uterus has been surgically removed. Cystic changes are noted in the right ovary. These appear simple in nature. The largest of these measures 3.1 Cm in dimension. Other: No abdominal wall hernia or abnormality. No abdominopelvic ascites. Musculoskeletal: Bilateral hip replacements are seen. No compression deformities are noted. Mild degenerative changes of lumbar spine are noted. IMPRESSION: Small  bilateral pleural effusions with bibasilar atelectatic changes similar to that seen on prior chest x-ray. Bilateral renal cysts simple in nature. Diverticulosis without diverticulitis. Cystic changes in the right ovary. The uterus has been surgically removed. Recommend follow-up US in 6-12 months. Note: This recommendation does not apply to premenarchal patients and to those with increased risk (genetic, family history, elevated tumor markers or other high-risk factors) of ovarian cancer. Reference: JACR 2020 Feb; 17(2):248-254 Electronically Signed   By: Inez Catalina M.D.   On: 05/28/2020 15:19   US Venous Img Lower Bilateral (DVT)  Result Date: 05/29/2020 CLINICAL DATA:  Elevated D-dimer, lower extremity pain  and edema EXAM: BILATERAL LOWER EXTREMITY VENOUS DOPPLER ULTRASOUND TECHNIQUE: Gray-scale sonography with graded compression, as well as color Doppler and duplex ultrasound were performed to evaluate the lower extremity deep venous systems from the level of the common femoral vein and including the common femoral, femoral, profunda femoral, popliteal and calf veins including the posterior tibial, peroneal and gastrocnemius veins when visible. The superficial great saphenous vein was also interrogated. Spectral Doppler was utilized to evaluate flow at rest and with distal augmentation maneuvers in the common femoral, femoral and popliteal veins. COMPARISON:  None. FINDINGS: RIGHT LOWER EXTREMITY Common Femoral Vein: No evidence of thrombus. Normal compressibility, respiratory phasicity and response to augmentation. Saphenofemoral Junction: No evidence of thrombus. Normal compressibility and flow on color Doppler imaging. Profunda Femoral Vein: No evidence of thrombus. Normal compressibility and flow on color Doppler imaging. Femoral Vein: No evidence of thrombus. Normal compressibility, respiratory phasicity and response to augmentation. Popliteal Vein: No evidence of thrombus. Normal compressibility,  respiratory phasicity and response to augmentation. Calf Veins: No evidence of thrombus. Normal compressibility and flow on color Doppler imaging. LEFT LOWER EXTREMITY Common Femoral Vein: No evidence of thrombus. Normal compressibility, respiratory phasicity and response to augmentation. Saphenofemoral Junction: No evidence of thrombus. Normal compressibility and flow on color Doppler imaging. Profunda Femoral Vein: No evidence of thrombus. Normal compressibility and flow on color Doppler imaging. Femoral Vein: No evidence of thrombus. Normal compressibility, respiratory phasicity and response to augmentation. Popliteal Vein: No evidence of thrombus. Normal compressibility, respiratory phasicity and response to augmentation. Calf Veins: No evidence of thrombus. Normal compressibility and flow on color Doppler imaging. IMPRESSION: No evidence of deep venous thrombosis in either lower extremity. Electronically Signed   By: Jerilynn Mages.  Shick M.D.   On: 05/29/2020 09:55   DG Hip Unilat W or Wo Pelvis 2-3 Views Left  Result Date: 05/28/2020 CLINICAL DATA:  Left hip pain after fall. EXAM: DG HIP (WITH OR WITHOUT PELVIS) 2-3V LEFT COMPARISON:  None. FINDINGS: Status post bilateral total hip arthroplasties. No fracture or dislocation is noted. Vascular calcifications are noted. IMPRESSION: No acute abnormality seen in the left hip. Electronically Signed   By: Marijo Conception M.D.   On: 05/28/2020 13:56    Scheduled Meds: . atorvastatin  80 mg Oral QPM  . Chlorhexidine Gluconate Cloth  6 each Topical Daily  . DULoxetine  30 mg Oral QPM  . enoxaparin (LOVENOX) injection  30 mg Subcutaneous Q24H  . lamoTRIgine  25 mg Oral QPM  . metoprolol succinate  25 mg Oral QPM  . ranolazine  1,000 mg Oral BID  . Vibegron  75 mg Oral Daily   Continuous Infusions: . levofloxacin (LEVAQUIN) IV 750 mg (05/29/20 1736)    Assessment/Plan:  1. Left upper lobar pneumonia.  Patient started on Levaquin by admitting physician.  Try  to taper off oxygen. 2. Acute metabolic encephalopathy.  Patient's daughter does not think that the patient's mental status back to normal.  Continue antibiotics.  Hold statin for right now 3. Frequent falls and possibility of syncope.  MRI of the brain reported as no acute intracranial abnormalities.  Physical therapy recommends rehab. 4. Elevated D-dimer but trending lower.  Ultrasound of the lower extremities negative. 5. Left hip pain.  X-ray negative for fracture.  Ultrasound negative for DVT.  Could be bursitis from fall.  Continue to work with physical therapy.  As needed pain medication. 6. Discontinue Foley catheter 7. Chronic kidney disease stage IIIb.  Continue to monitor 8. Iron deficiency  anemia.  We'll start ferrous sulfate.  Continue to monitor hemoglobin 9. Essential hypertension on clonidine and Toprol 10. frequent urination.  Needs to train bladder and use the bathroom quite often.   Code Status:     Code Status Orders  (From admission, onward)         Start     Ordered   05/28/20 1926  Full code  Continuous        05/28/20 1929        Code Status History    Date Active Date Inactive Code Status Order ID Comments User Context   12/20/2017 0936 12/21/2017 2015 Full Code [id number]  Gladstone Lighter, MD Inpatient   12/08/2015 1539 12/08/2015 1938 Full Code [id number]  Yolonda Kida, MD Inpatient   11/26/2015 1101 11/27/2015 1747 Full Code [id number]  Dustin Flock, MD ED   Advance Care Planning Activity     Family Communication: Spoke with the patient's daughter outside of the room. Disposition Plan: Status is: Inpatient  Dispo: The patient is from: Home              Anticipated d/c is to: Rehab              Anticipated d/c date is: Potentially in the next day or so depending on how she is doing              Patient currently has acute metabolic encephalopathy and being treated for lobar pneumonia.   Difficult to place patient.  Hopefully not, but will need  rehab.  Messaged TOC.  Time spent: 27 minutes  [place]

## 2020-05-30 NOTE — TOC Initial Note (Signed)
Transition of Care [facility]) - Initial/Assessment Note    Patient Details  Name: Monique Burns MRN: [id number] Date of Birth: 1931-03-15  Transition of Care [facility]) CM/SW Contact:    Boris Sharper, LCSW Phone Number: 05/30/2020, 5:11 PM  Clinical Narrative:                 CSW discussed PT recommendations and discharge plan with pt's daughter [name] due to pt's fluctuating orientation. Pt was home before admission with no stable caregivers (different family members with her throughout the day). [name] is pt's POA ans stated that pt is unable to take care of herself and although she will not like it [name] thinks that SNF will be her best option. CSW completed FL2 and PASRR and faxed to surrounding facilities.  Expected Discharge Plan: Skilled Nursing Facility Barriers to Discharge: Continued Medical Work up   Patient Goals and CMS Choice Patient states their goals for this hospitalization and ongoing recovery are:: Daughter states to gain strength CMS Medicare.gov Compare Post Acute Care list provided to:: Patient Represenative (must comment) Choice offered to / list presented to : Adult [place] / Guardian  Expected Discharge Plan and Services Expected Discharge Plan: Pastos arrangements for the past 2 months: Single Family Home                                      Prior Living Arrangements/Services Living arrangements for the past 2 months: Single Family Home Lives with:: Self Patient language and need for interpreter reviewed:: Yes        Need for Family Participation in Patient Care: Yes (Comment) Care giver support system in place?: Yes (comment)   Criminal Activity/Legal Involvement Pertinent to Current Situation/Hospitalization: No - Comment as needed  Activities of Daily Living      Permission Sought/Granted Permission sought to share information with : Facility Art therapist granted to share  information with : Yes, Verbal Permission Granted  Share Information with NAME: [name]     Permission granted to share info w Relationship: daughter  Permission granted to share info w Contact Information: [phone]  Emotional Assessment Appearance:: Other (Comment Required (unable to assess) Attitude/Demeanor/Rapport: Unable to Assess Affect (typically observed): Unable to Assess Orientation: : Oriented to Self,Oriented to Place Alcohol / Substance Use: Not Applicable Psych Involvement: No (comment)  Admission diagnosis:  CAP (community acquired pneumonia) [J18.9] Elevated d-dimer [R79.89] Contusion of left hip, initial encounter [S70.02XA] Community acquired pneumonia of left upper lobe of lung [J18.9] Patient Active Problem List   Diagnosis Date Noted  . Left hip pain   . Lobar pneumonia ([place])   . Acute metabolic encephalopathy   . Falls frequently   . Elevated d-dimer   . Anemia   . CAP (community acquired pneumonia) 05/28/2020  . Overactive bladder 04/08/2020  . Urge incontinence 04/08/2020  . Nocturia 04/08/2020  . Unstable angina ([place]) 12/20/2017  . Protein-calorie malnutrition, severe 11/27/2015  . Chest pain 11/26/2015  . Chronic kidney disease 04/05/2015  . HLD (hyperlipidemia) 04/05/2015  . Essential hypertension 04/05/2015  . Decreased leukocytes 04/05/2015  . Arthritis, degenerative 04/05/2015  . Disease of thyroid gland 04/05/2015  . Acute myocardial infarction of anterior wall ([place]) 02/28/2015  . Degenerative arthritis of hip 10/31/2013   PCP:  Adin Hector, MD Pharmacy:   [place], [place] -  [place] [address] Phone: [phone] Fax: [phone]     Social Determinants of Health (SDOH) Interventions    Readmission Risk Interventions No flowsheet data found.

## 2020-05-31 LAB — BASIC METABOLIC PANEL
Anion gap: 12 (ref 5–15)
BUN: 41 mg/dL — ABNORMAL HIGH (ref 8–23)
CO2: 20 mmol/L — ABNORMAL LOW (ref 22–32)
Calcium: 8.9 mg/dL (ref 8.9–10.3)
Chloride: 106 mmol/L (ref 98–111)
Creatinine, Ser: 1.78 mg/dL — ABNORMAL HIGH (ref 0.44–1.00)
GFR, Estimated: 27 mL/min — ABNORMAL LOW (ref >60)
Glucose, Bld: 94 mg/dL (ref 70–99)
Potassium: 3.9 mmol/L (ref 3.5–5.1)
Sodium: 138 mmol/L (ref 135–145)

## 2020-05-31 LAB — LEGIONELLA PNEUMOPHILA SEROGP 1 UR AG: L. pneumophila Serogp 1 Ur Ag: NEGATIVE

## 2020-05-31 LAB — CBC
HCT: 25 % — ABNORMAL LOW (ref 36.0–46.0)
Hemoglobin: 8.2 g/dL — ABNORMAL LOW (ref 12.0–15.0)
MCH: 32.8 pg (ref 26.0–34.0)
MCHC: 32.8 g/dL (ref 30.0–36.0)
MCV: 100 fL (ref 80.0–100.0)
Platelets: 175 10*3/uL (ref 150–400)
RBC: 2.5 MIL/uL — ABNORMAL LOW (ref 3.87–5.11)
RDW: 12.7 % (ref 11.5–15.5)
WBC: 5.6 10*3/uL (ref 4.0–10.5)
nRBC: 0 % (ref 0.0–0.2)

## 2020-05-31 LAB — CK: Total CK: 213 U/L (ref 38–234)

## 2020-05-31 MED ORDER — RANOLAZINE ER 500 MG PO TB12
500.0000 mg | ORAL_TABLET | Freq: Two times a day (BID) | ORAL | Status: DC
Start: 2020-05-31 — End: 2020-06-01
  Administered 2020-05-31 – 2020-06-01 (×2): 500 mg via ORAL
  Filled 2020-05-31 (×3): qty 1

## 2020-05-31 MED ORDER — LEVOFLOXACIN IN D5W 500 MG/100ML IV SOLN
500.0000 mg | INTRAVENOUS | Status: DC
  Administered 2020-05-31: 500 mg via INTRAVENOUS
  Filled 2020-05-31: qty 100

## 2020-05-31 MED ORDER — SODIUM CHLORIDE 0.9 % IV SOLN: INTRAVENOUS | Status: DC

## 2020-05-31 NOTE — Progress Notes (Signed)
Pharmacy Antibiotic Note  Monique Burns is a 87 y.o. female admitted on 05/28/2020 with pneumonia.    Pharmacy has been consulted for levaquin dosing and monitoring.  Plan: Levaquin 750 mg IV Q48H ordered to start on 1/22 @ 1600.    Will change to 500mg  q48h per CrCL < 20 ml/min  Height: 5\' 7"  (170.2 cm) Weight: 55.8 kg (123 lb) IBW/kg (Calculated) : 61.6  Temp (24hrs), Avg:98.3 F (36.8 C), Min:97.9 F (36.6 C), Max:98.7 F (37.1 C)  Recent Labs  Lab 05/28/20 1004 05/28/20 1333 05/29/20 0500 05/30/20 0521 05/31/20 0254  WBC 7.5  --  8.0 4.8 5.6  CREATININE 1.38*  --  1.30* 1.48* 1.78*  LATICACIDVEN  --  1.4  --   --   --     Estimated Creatinine Clearance: 19.2 mL/min (A) (by C-G formula based on SCr of 1.78 mg/dL (H)).    Allergies  Allergen Reactions  . Brilinta [Ticagrelor] Other (See Comments)    "caused stent to fail"  . Imdur [Isosorbide Nitrate] Other (See Comments)    Headache    Antimicrobials this admission: Levaquin 1/22  >>   Azithromycin/CTX 1/21 x 1  Dose adjustments this admission:   Microbiology results: 1/21 BCx: NGTD  1/21 UCx: NGTD COVID NEG  Thank you for allowing pharmacy to be a part of this patient's care.  Lu Duffel, PharmD, BCPS Clinical Pharmacist 05/31/2020 9:03 AM

## 2020-05-31 NOTE — Progress Notes (Signed)
PROGRESS NOTE    Monique Burns  MGQ:[id number] DOB: 07/07/1930 DOA: 05/28/2020 PCP: Adin Hector, MD    Brief Narrative:  Patient seen this morning.  She stated that she feels well.  She had had pain in her abdomen in her hip but feeling better today.  She asked me to take the catheter out of the bladder.  Patient interested in going home.  Spoke with the patient's daughter outside the room and she states that the patient's mental status is not back to her baseline. She states that she cannot take care of her at home and was interested in rehab.  1/24-creatinine up 1.74. daughter at bedside.   Consultants:     Procedures:   Antimicrobials:   levofloxacin   Subjective: Pt pleasant without complaints. Ate breakfast .   Objective: Vitals:   05/31/20 0428 05/31/20 0746 05/31/20 1100 05/31/20 1143  BP: (!) 141/47 (!) 155/52  (!) 115/47  Pulse: (!) 57 62 (!) 59 (!) 55  Resp: 16 15 14 18   Temp: 98.1 F (36.7 C) 98.5 F (36.9 C)  97.8 F (36.6 C)  TempSrc: Oral   Oral  SpO2: 99% 96% 100% 100%  Weight:      Height:        Intake/Output Summary (Last 24 hours) at 05/31/2020 1423 Last data filed at 05/31/2020 1045 Gross per 24 hour  Intake -  Output 1075 ml  Net -1075 ml   Filed Weights   05/28/20 0948 05/30/20 0441  Weight: 52.2 kg 55.8 kg    Examination:  General exam: Appears calm and comfortable  Respiratory system: Clear to auscultation. Respiratory effort normal. Cardiovascular system: S1 & S2 heard, RRR. No JVD, murmurs, rubs, gallops or clicks.  Gastrointestinal system: Abdomen is nondistended, soft and nontender. Normal bowel sounds heard. Central nervous system: Awake, oriented to place but not to person or date.  Grossly intact Extremities: No edema Psychiatry:  Mood & affect appropriate in current setting.     Data Reviewed: I have personally reviewed following labs and imaging studies  CBC: Recent Labs  Lab 05/28/20 1004 05/29/20 0500  05/30/20 0521 05/31/20 0254  WBC 7.5 8.0 4.8 5.6  HGB 10.9* 9.6* 8.4* 8.2*  HCT 33.4* 30.4* 25.5* 25.0*  MCV 100.0 101.3* 99.6 100.0  PLT 238 224 183 267   Basic Metabolic Panel: Recent Labs  Lab 05/28/20 1004 05/29/20 0500 05/30/20 0521 05/31/20 0254  NA 135 136 138 138  K 4.5 4.1 4.1 3.9  CL 104 104 108 106  CO2 19* 19* 20* 20*  GLUCOSE 128* 109* 96 94  BUN 39* 30* 36* 41*  CREATININE 1.38* 1.30* 1.48* 1.78*  CALCIUM 9.7 9.4 8.8* 8.9   GFR: Estimated Creatinine Clearance: 19.2 mL/min (A) (by C-G formula based on SCr of 1.78 mg/dL (H)). Liver Function Tests: Recent Labs  Lab 05/29/20 0500  AST 39  ALT 18  ALKPHOS 60  BILITOT 1.2  PROT 6.9  ALBUMIN 3.9   No results for input(s): LIPASE, AMYLASE in the last 168 hours. No results for input(s): AMMONIA in the last 168 hours. Coagulation Profile: No results for input(s): INR, PROTIME in the last 168 hours. Cardiac Enzymes: Recent Labs  Lab 05/29/20 0500 05/31/20 0254  CKTOTAL 370* 213   BNP (last 3 results) No results for input(s): PROBNP in the last 8760 hours. HbA1C: No results for input(s): HGBA1C in the last 72 hours. CBG: No results for input(s): GLUCAP in the last 168 hours. Lipid  Profile: No results for input(s): CHOL, HDL, LDLCALC, TRIG, CHOLHDL, LDLDIRECT in the last 72 hours. Thyroid Function Tests: No results for input(s): TSH, T4TOTAL, FREET4, T3FREE, THYROIDAB in the last 72 hours. Anemia Panel: Recent Labs    05/30/20 0521  VITAMINB12 324  FERRITIN 58  TIBC 323  IRON 25*   Sepsis Labs: Recent Labs  Lab 05/28/20 1333  LATICACIDVEN 1.4    Recent Results (from the past 240 hour(s))  Urine culture     Status: None   Collection Time: 05/28/20 11:19 AM   Specimen: Urine, Clean Catch  Result Value Ref Range Status   Specimen Description   Final    URINE, CLEAN CATCH Performed at [facility], [address] [place], [place] [address]    Special Requests   Final     NONE Performed at [facility], [address], [place], [place] [address]    Culture   Final    NO GROWTH Performed at [facility], [place] [address] [place], [place] [address]    Report Status 05/30/2020 FINAL  Final  Blood culture (single)     Status: None (Preliminary result)   Collection Time: 05/28/20  1:33 PM   Specimen: BLOOD  Result Value Ref Range Status   Specimen Description BLOOD RIGHT ANTECUBITAL  Final   Special Requests   Final    BOTTLES DRAWN AEROBIC AND ANAEROBIC Blood Culture adequate volume   Culture   Final    NO GROWTH 3 DAYS Performed at [facility], [address], [place], [place] [address]    Report Status PENDING  Incomplete  SARS CORONAVIRUS 2 (TAT 6-24 HRS) Nasopharyngeal Nasopharyngeal Swab     Status: None   Collection Time: 05/28/20  2:38 PM   Specimen: Nasopharyngeal Swab  Result Value Ref Range Status   SARS Coronavirus 2 NEGATIVE NEGATIVE Final    Comment: (NOTE) SARS-CoV-2 target nucleic acids are NOT DETECTED.  The SARS-CoV-2 RNA is generally detectable in upper and lower respiratory specimens during the acute phase of infection. Negative results do not preclude SARS-CoV-2 infection, do not rule out co-infections with other pathogens, and should not be used as the sole basis for treatment or other patient management decisions. Negative results must be combined with clinical observations, patient history, and epidemiological information. The expected result is Negative.  Fact Sheet for Patients: SugarRoll.be  Fact Sheet for Healthcare Providers: https://www.woods-mathews.com/  This test is not yet approved or cleared by the [place] FDA and  has been authorized for detection and/or diagnosis of SARS-CoV-2 by FDA under an Emergency Use Authorization (EUA). This EUA will remain  in effect (meaning this test can be used) for the duration of the COVID-19 declaration  under Se ction 564(b)(1) of the Act, 21 U.S.C. section 360bbb-3(b)(1), unless the authorization is terminated or revoked sooner.  Performed at [facility], [place] [address], [place], [place] [address]   Culture, blood (routine x 2) Call MD if unable to obtain prior to antibiotics being given     Status: None (Preliminary result)   Collection Time: 05/28/20  8:02 PM   Specimen: BLOOD  Result Value Ref Range Status   Specimen Description BLOOD BLOOD RIGHT FOREARM  Final   Special Requests   Final    BOTTLES DRAWN AEROBIC AND ANAEROBIC Blood Culture adequate volume   Culture   Final    NO GROWTH 3 DAYS Performed at [facility], [address] [place], [place]  [address]    Report Status PENDING  Incomplete  Culture, blood (routine x 2) Call MD if unable to obtain prior to antibiotics being given     Status: None (Preliminary result)   Collection Time: 05/28/20  8:09 PM   Specimen: BLOOD  Result Value Ref Range Status   Specimen Description BLOOD RIGHT ANTECUBITAL  Final   Special Requests   Final    BOTTLES DRAWN AEROBIC AND ANAEROBIC Blood Culture adequate volume   Culture   Final    NO GROWTH 3 DAYS Performed at [facility], [address], [place], [place] [address]    Report Status PENDING  Incomplete         Radiology Studies: No results found.      Scheduled Meds: . Chlorhexidine Gluconate Cloth  6 each Topical Daily  . DULoxetine  30 mg Oral QPM  . enoxaparin (LOVENOX) injection  30 mg Subcutaneous Q24H  . ferrous sulfate  325 mg Oral Q breakfast  . lamoTRIgine  25 mg Oral QPM  . metoprolol succinate  25 mg Oral QPM  . ranolazine  500 mg Oral BID  . Vibegron  75 mg Oral Daily   Continuous Infusions: . sodium chloride    . levofloxacin [facility]) IV      Assessment & Plan:   Active Problems:   Essential hypertension   CAP (community acquired pneumonia)   Lobar pneumonia ([place])   Acute metabolic encephalopathy   Falls  frequently   Elevated d-dimer   Anemia   Left hip pain   1. Left upper lobar pneumonia.  Patient started on Levaquin by admitting physician.  Continue Levaquin, wean down oxygenation  2. acute metabolic encephalopathy. -Per daughter at bedside patient's mental status is improving but not at baseline.  This is likely due to her pneumonia.  She may have some undiagnosed dementia at baseline but daughter is not aware of this diagnosis.  Continue treatment of pneumonia and  reorientation 3. Frequent falls and possibility of syncope?[place]  MRI without acute abn. PT rec. Rehab 4.A/CKD stage IIIb- likely prerenal. Will start gentle ivf hydration. Monitor labs 5.Elevated D-dimer but trending lower.  Ultrasound of the lower extremities negative. 6.Left hip pain.  X-ray negative for fracture.  Ultrasound negative for DVT.  Could be bursitis from fall.  Continue to work with physical therapy.  As needed pain medication. Discontinue Foley catheter 7.Iron deficiency anemia.  We'll start ferrous sulfate.  Continue to monitor hemoglobin 8.Essential hypertension on clonidine and Toprol 9. frequent urination.  Needs to train bladder and use the bathroom quite often.   DVT prophylaxis: Lovenox Code Status: Full Family Communication: Daughter at bedside  Status is: Inpatient  Remains inpatient appropriate because:unsafe discharge  Dispo: The patient is from: Home              Anticipated d/c is to: SNF              Anticipated d/c date is: 2 days              Patient currently is medically stable to d/c.   Difficult to place patient Yes, currently no bed available at SNF.  Placement pending            LOS: 3 days   Time spent: 35 minutes with more than 50% on [place], MD [facility] Pager [phone]  If 7PM-7AM, please contact night-coverage 05/31/2020, 2:23 PM

## 2020-05-31 NOTE — Plan of Care (Signed)

## 2020-05-31 NOTE — Care Management Important Message (Signed)
Important Message  Patient Details  Name: Monique Burns MRN: [id number] Date of Birth: [birth year]   Medicare Important Message Given:  Yes     [name] 05/31/2020, 12:24 PM

## 2020-05-31 NOTE — Progress Notes (Signed)
Occupational Therapy Treatment Patient Details Name: Monique Burns MRN: [id number] DOB: 18-Feb-1931 Today's Date: 05/31/2020    History of present illness Pt is an 87yo F admitted to [facility] for fall that resulted in R hematoma of head, and L hip/foot pain. Daughter endorses increased confusion and generalized weakness over the past week. CXR revealed LUL infiltrate concerning for community acquired PNA, brain MRI revealed remote R temporal insult, and L hip XR negative for fx. Significant PMH includes: CAD s/p MI s/p CABG, HLD, HTN, CKD (III), OAB, and sz. Pt with possible reaction to rocephin, stating that she feels like she's drowning, requiring 2L O2 via [place].   OT comments  Pt seen for OT treatment on this date. Upon arrival to room, pt returned to bed from [facility] with nursing, c/o nausea. Pt seated upright in bed with daughter [name]) present. Pt agreeable to bed-level ADLs, however deferred OOB mobility d/t fatigue and nausea following transfer. This date, pt required MAX A for bed-level LB dressing and MOD A for bed-level grooming tasks. At end of session, OT discussed d/c recommendation with pt and daughter, with daughter in agreement and pt open to recommendation. Pt continues to benefit from skilled OT services to maximize return to PLOF and minimize risk of future falls, injury, caregiver burden, and readmission. Will continue to follow POC. Discharge recommendation remains appropriate.    Follow Up Recommendations  SNF    Equipment Recommendations  Other (comment) (defer to next level of care)       Precautions / Restrictions Precautions Precautions: Fall Restrictions Weight Bearing Restrictions: No       Mobility Bed Mobility                  Transfers                  Pt deferred OOB mobility d/t fatigue and nausea following recent transfer bed<>BSC with nursing.        ADL either performed or assessed with clinical judgement   ADL Overall ADL's  : Needs assistance/impaired     Grooming: Wash/dry face;Brushing hair;Moderate assistance;Bed level Grooming Details (indicate cue type and reason): Pt required increased assist for washing/drying top half of face d/t fatigue             Lower Body Dressing: Maximal assistance;Bed level Lower Body Dressing Details (indicate cue type and reason): to don/doff socks                               Cognition Arousal/Alertness: Awake/alert Behavior During Therapy: Anxious Overall Cognitive Status: History of cognitive impairments - at baseline                                 General Comments: Pt less confused, oriented to situation and past medical history        Exercises General Exercises - Lower Extremity Ankle Circles/Pumps: AROM;Both;10 reps;Supine           Pertinent Vitals/ Pain       Pain Assessment: No/denies pain         Frequency  Min 1X/week        Progress Toward Goals  OT Goals(current goals can now be found in the care plan section)  Progress towards OT goals: Progressing toward goals  Acute Rehab OT Goals Patient Stated Goal: To get better  OT Goal Formulation: With patient/family Time For Goal Achievement: 06/13/20 Potential to Achieve Goals: Good  Plan Discharge plan remains appropriate;Frequency remains appropriate       AM-PAC OT "6 Clicks" Daily Activity     Outcome Measure   Help from another person eating meals?: A Little Help from another person taking care of personal grooming?: A Lot Help from another person toileting, which includes using toliet, bedpan, or urinal?: A Lot Help from another person bathing (including washing, rinsing, drying)?: A Lot Help from another person to put on and taking off regular upper body clothing?: A Little Help from another person to put on and taking off regular lower body clothing?: A Lot 6 Click Score: 14    End of Session Equipment Utilized During Treatment: Oxygen  OT  Visit Diagnosis: Other abnormalities of gait and mobility (R26.89);History of falling (Z91.81)   Activity Tolerance Patient limited by fatigue   Patient Left in bed;with call bell/phone within reach;with bed alarm set;with nursing/sitter in room   Nurse Communication Mobility status        Time: 1464-3142 OT Time Calculation (min): 20 min  Charges: OT General Charges $OT Visit: 1 Visit OT Treatments $Self Care/Home Management : 8-22 mins  Fredirick Maudlin, OTR/L [place]

## 2020-05-31 NOTE — Progress Notes (Signed)
  Speech Language Pathology Treatment: Dysphagia  Patient Details Name: Monique Burns MRN: [id number] DOB: 10-23-1930 Today's Date: 05/31/2020 Time: 1400-1446 SLP Time Calculation (min) (ACUTE ONLY): 46.97 min  Assessment / Plan / Recommendation Clinical Impression  Ongoing assessment of swallowing through treatment today. Lunch tray sitting at the side of the bed. Pt reported she would like to eat. ST assisted with lunch for treatment session. Pt needed extended time to masticate soft solids/Dysphagia 3. Mild oral residue after each swallow which she cleared with f/u swallows of thin liquids. No s/s of aspiration with the soft solids. Throat clear noted x1 after thin liquids during meal. Pts overal cognitive status is m,uch improved since Saturday as Pt was able to carry on a conversation about her primary MD and was aware that she is currently in the hospital. A friend called on the phone and Mrs Vanderwerf was able to tell her about her current state of health. Rec continue with current Dys 3 diet for now. May need to consider diet downgrade to Dys 2 for ease of chewing and decreased effort if Po's continue to be poor.   HPI HPI: Per admitting H&P "NIKITHA MODE is a 87 y.o. 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 Patient states that for one week she had a cough and generally feeling somewhat unwell. She states last pm while attempting to get in bed she felt weak and fell. She noted that she his her head and had pain to left hip. She states she was unable to get up. She did not call out. She thought that if she rested she would be able to gather her strength and get in bed. She was found by her family early in the am on the floor. Per daughter she was note to cold. Per her daughter she had been somewhat confused different from her usual  baseline. Patient denies any fever/ chills/ chest pain, nausea/vomitting/sorethroat or muscle aches. She also denies diarrhea currently but notes she has constipation at times. She notes no significant shortness of breath. She does have complaint of bladder spasm which are intermittent and intense.She notes she does have significant symptoms related to her OAB"      SLP Plan  Continue with current plan of care       Recommendations  Diet recommendations: Dysphagia 3 (mechanical soft) Liquids provided via: Straw Medication Administration: Whole meds with puree Supervision: Full supervision/cueing for compensatory strategies Compensations: Slow rate;Small sips/bites Postural Changes and/or Swallow Maneuvers: Seated upright 90 degrees;Upright 30-60 min after meal                Oral Care Recommendations: Oral care BID Follow up Recommendations: Skilled Nursing facility SLP Visit Diagnosis: Dysphagia, oropharyngeal phase (R13.12) Plan: Continue with current plan of care       GO                Lucila Maine 05/31/2020, 2:47 PM

## 2020-06-01 LAB — BASIC METABOLIC PANEL
Anion gap: 11 (ref 5–15)
BUN: 33 mg/dL — ABNORMAL HIGH (ref 8–23)
CO2: 19 mmol/L — ABNORMAL LOW (ref 22–32)
Calcium: 8.8 mg/dL — ABNORMAL LOW (ref 8.9–10.3)
Chloride: 107 mmol/L (ref 98–111)
Creatinine, Ser: 1.42 mg/dL — ABNORMAL HIGH (ref 0.44–1.00)
GFR, Estimated: 36 mL/min — ABNORMAL LOW (ref >60)
Glucose, Bld: 99 mg/dL (ref 70–99)
Potassium: 3.8 mmol/L (ref 3.5–5.1)
Sodium: 137 mmol/L (ref 135–145)

## 2020-06-01 LAB — FIBRIN DERIVATIVES D-DIMER (ARMC ONLY): Fibrin derivatives D-dimer (ARMC): 1413.84 ng/mL (FEU) — ABNORMAL HIGH (ref 0.00–499.00)

## 2020-06-01 LAB — SARS CORONAVIRUS 2 BY RT PCR (HOSPITAL ORDER, PERFORMED IN ~~LOC~~ HOSPITAL LAB): SARS Coronavirus 2: NEGATIVE

## 2020-06-01 MED ORDER — LEVOFLOXACIN IN D5W 750 MG/150ML IV SOLN: 750.0000 mg | INTRAVENOUS | Status: DC

## 2020-06-01 MED ORDER — FERROUS SULFATE 325 (65 FE) MG PO TABS: 325.0000 mg | ORAL_TABLET | Freq: Every day | ORAL | 3 refills | Status: DC

## 2020-06-01 MED ORDER — LEVOFLOXACIN 750 MG PO TABS: 750.0000 mg | ORAL_TABLET | Freq: Every day | ORAL | 0 refills | Status: AC

## 2020-06-01 MED ORDER — LIDOCAINE 5 % EX PTCH
1.0000 | MEDICATED_PATCH | CUTANEOUS | Status: DC
  Administered 2020-06-01: 1 via TRANSDERMAL
  Filled 2020-06-01: qty 1

## 2020-06-01 MED ORDER — LIDOCAINE 5 % EX PTCH: 1.0000 | MEDICATED_PATCH | CUTANEOUS | 0 refills | Status: DC

## 2020-06-01 NOTE — TOC Progression Note (Signed)
Transition of Care [facility]) - Progression Note    Patient Details  Name: Monique Burns MRN: [id number] Date of Birth: 1930-11-23  Transition of Care [facility]) CM/SW [place], RN Phone Number: 06/01/2020, 10:12 AM  Clinical Narrative:   RNCM reached out to patient's daughter [name] to present bed offers of [facility] and [facility]. Provided instructions to daughter on how to access Medicare.gov for ratings of facilities. [name] is requesting time to look over her options.   10:25: Daughter [name] returned call and is wanting to accept bed for [facility].  RNCM accepted bed in hub and reached out to facility, [facility] notified.     Expected Discharge Plan: [place] Barriers to Discharge: Continued Medical Work up  Expected Discharge Plan and Services Expected Discharge Plan: [place] arrangements for the past 2 months: Single Family Home                                       Social Determinants of Health (SDOH) Interventions    Readmission Risk Interventions No flowsheet data found.

## 2020-06-01 NOTE — Progress Notes (Signed)
Pharmacy Antibiotic Note  Monique Burns is a 87 y.o. female admitted on 05/28/2020 with pneumonia.    Pharmacy has been consulted for levaquin dosing and monitoring.  Plan: Levaquin 750 mg IV Q48H ordered to start on 1/22 @ 1600.    Will change back to 750mg  V79K per CrCL > 20 ml/min  Height: 5\' 7"  (170.2 cm) Weight: 55.8 kg (123 lb) IBW/kg (Calculated) : 61.6  Temp (24hrs), Avg:97.7 F (36.5 C), Min:97.6 F (36.4 C), Max:97.8 F (36.6 C)  Recent Labs  Lab 05/28/20 1004 05/28/20 1333 05/29/20 0500 05/30/20 0521 05/31/20 0254 06/01/20 0525  WBC 7.5  --  8.0 4.8 5.6  --   CREATININE 1.38*  --  1.30* 1.48* 1.78* 1.42*  LATICACIDVEN  --  1.4  --   --   --   --     Estimated Creatinine Clearance: 24.1 mL/min (A) (by C-G formula based on SCr of 1.42 mg/dL (H)).    Allergies  Allergen Reactions  . Brilinta [Ticagrelor] Other (See Comments)    "caused stent to fail"  . Imdur [Isosorbide Nitrate] Other (See Comments)    Headache    Antimicrobials this admission: Levaquin 1/22  >>   Azithromycin/CTX 1/21 x 1  Dose adjustments this admission:   Microbiology results: 1/21 BCx: NGTD  1/21 UCx: NGTD COVID NEG  Thank you for allowing pharmacy to be a part of this patient's care.  Lu Duffel, PharmD, BCPS Clinical Pharmacist 06/01/2020 8:12 AM

## 2020-06-01 NOTE — Progress Notes (Signed)
Patient retaining urine. 4483 bladder scanned. 845 noted. NP [place] notified. Received order to do in and out cath. In and out cath done at 0341. Output of 600 noted.

## 2020-06-01 NOTE — Progress Notes (Signed)
Report given to [place] at [facility]. No further questions.

## 2020-06-01 NOTE — Progress Notes (Signed)
IV and tele removed from patient. Discharge instructions completed and placed in packet for SNF. Report to be called. Care manager arranged for transport.

## 2020-06-01 NOTE — Progress Notes (Signed)
Physical Therapy Treatment Patient Details Name: Monique Burns MRN: [id number] DOB: 24-May-1930 Today's Date: 06/01/2020    History of Present Illness Pt is an 87yo F admitted to [facility] on 05/28/20 for fall that resulted in R hematoma of head, and L hip/foot pain. Daughter endorses increased confusion and generalized weakness over the past week. CXR revealed LUL infiltrate concerning for community acquired PNA, brain MRI revealed remote R temporal insult, and L hip XR negative for fx. Significant PMH includes: CAD s/p MI s/p CABG, HLD, HTN, CKD (III), OAB, and sz. Pt with possible reaction to rocephin, stating that she feels like she's drowning, requiring 2L O2 via [place].    PT Comments    Pt tolerated treatment well today and was able to improve ambulation distance, seated balance, cognition, assistance for gait, and gait quality since last session. Pt A&O x4 and was able to maintain SpO2 >94% on 2L O2 via [place] without c/o DOE. Despite progress, pt continues to be limited with meeting goals due to functional weakness, decreased activity tolerance, and decreased balance.  Pt required mod assist for bed mobility, mod assist for transfers, and min assist for short distance ambulation with RW from EOB<>BSC; RN notified of BM. Pt will continue to benefit from skilled acute PT services to address deficits for return to baseline function. Will continue to recommend SNF at [place] with 24/7 care.   Follow Up Recommendations  SNF;Supervision/Assistance - 24 hour     Equipment Recommendations  Other (comment) (Defer to post acute facility)    Recommendations for Other Services       Precautions / Restrictions Precautions Precautions: Fall Restrictions Weight Bearing Restrictions: No    Mobility  Bed Mobility Overal bed mobility: Needs Assistance Bed Mobility: Supine to Sit;Sit to Supine     Supine to sit: Mod assist;HOB elevated Sit to supine: Mod assist   General bed mobility comments: Mod assist  for trunk/BLE facilitation for supine<>sit transfer, with cues for sequencing and hand placement.  Transfers Overall transfer level: Needs assistance Equipment used: Rolling walker (2 wheeled) Transfers: Sit to/from Stand Sit to Stand: Mod assist         General transfer comment: Mod assist to perform STS from elevated bed height and commode with RW; multimodal cues for hand placement and sequencing.  Ambulation/Gait Ambulation/Gait assistance: Min assist Gait Distance (Feet): 4 Feet (40ft x2) Assistive device: Rolling walker (2 wheeled)   Gait velocity: decreased   General Gait Details: Min assist for balance/RW negotiation to ambulate short distance from EOB<>BSC with RW. Pt demonstrated slowed cadence, early reciprocal gait, decreased step length/foot clearance, and poor RW negotiation/proximity. Multimodal cues for safety with RW.     Balance Overall balance assessment: Needs assistance Sitting-balance support: No upper extremity supported;Feet supported Sitting balance-Leahy Scale: Good Sitting balance - Comments: Good seated balance at EOB and BSC without UE support   Standing balance support: Bilateral upper extremity supported;During functional activity Standing balance-Leahy Scale: Poor Standing balance comment: Poor standing balance in RW requiring min guard - mod assist for safety. Pt able to maintain static standing balance with BUE support and min guard for PT assist with pericare; no LOB.                            Cognition Arousal/Alertness: Awake/alert Behavior During Therapy: WFL for tasks assessed/performed Overall Cognitive Status: History of cognitive impairments - at baseline  General Comments: Pt A&O x4 and able to follow 100% of simple one-step commands      Exercises Other Exercises Other Exercises: Pt able to participate in bed mobility, transfers, and short distance ambulation with RW,  requiring grossly min-mod assist for safety/balance. Increased cueing with mobility for safety and sequencing. RPE of 7-8/10 indicating "vigorous activity". Other Exercises: Pt able to participate in x10 BLE exercises including: quad sets, ankle pumps, heel slides, SLR, hip ABD/ADD, isometric hip ADD, glute sets, and SAQ.    General Comments General comments (skin integrity, edema, etc.): Bowel movement on BSC; RN notified      Pertinent Vitals/Pain Pain Assessment: No/denies pain           PT Goals (current goals can now be found in the care plan section) Acute Rehab PT Goals Patient Stated Goal: To get better PT Goal Formulation: With patient/family Time For Goal Achievement: 06/12/20 Potential to Achieve Goals: Fair Progress towards PT goals: Progressing toward goals    Frequency    Min 2X/week      PT Plan Current plan remains appropriate       AM-PAC PT "6 Clicks" Mobility   Outcome Measure  Help needed turning from your back to your side while in a flat bed without using bedrails?: A Little Help needed moving from lying on your back to sitting on the side of a flat bed without using bedrails?: A Lot Help needed moving to and from a bed to a chair (including a wheelchair)?: A Little Help needed standing up from a chair using your arms (e.g., wheelchair or bedside chair)?: A Lot Help needed to walk in hospital room?: A Lot Help needed climbing 3-5 steps with a railing? : Total 6 Click Score: 13    End of Session Equipment Utilized During Treatment: Gait belt Activity Tolerance: Patient tolerated treatment well;Patient limited by fatigue Patient left: in bed;with call bell/phone within reach;with bed alarm set Nurse Communication: Mobility status (BM and A&O x4) PT Visit Diagnosis: Unsteadiness on feet (R26.81);Repeated falls (R29.6);Muscle weakness (generalized) (M62.81);History of falling (Z91.81);Difficulty in walking, not elsewhere classified (R26.2)      Time: 0931-1000 PT Time Calculation (min) (ACUTE ONLY): 29 min  Charges:  $Therapeutic Exercise: 8-22 mins $Therapeutic Activity: 8-22 mins                    Herminio Commons, PT, DPT 10:33 AM,06/01/20

## 2020-06-01 NOTE — Discharge Summary (Addendum)
ELPIDIA Burns PXT:[id number] DOB: 12-13-1930 DOA: 05/28/2020  PCP: Adin Hector, MD  Admit date: 05/28/2020 Discharge date: 06/01/2020  Admitted From: Home Disposition:  [facility]  Recommendations for Outpatient Follow-up:  1. Follow up with PCP in 1 week 2. Please obtain BMP/CBC in one week      Discharge Condition:Stable CODE STATUS:Full   Diet recommendation: Heart Healthy Dysphagia 3  Brief/Interim Summary: Per HPI: Monique Burns is a 87 y.o. 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 Patient stated that for one week she had a cough and generally feeling somewhat unwell. She stated while attempting to get in bed she felt weak and fell. She noted that she hit head and had pain to left hip. Per family she was somewhat confused , from her baseline.  Chest x-ray with airspace opacity consistent with pneumonia of left upper lobe.  X-ray without any acute abnormality.  Her Covid test was negative.  1. Left upper lobar pneumonia. - on levaquin, next and last dose to be given on 1/26. Currently on RA , 02 sat in upper 90's. Keep 02 sat >92 %  2. acute metabolic encephalopathy. Due to Pneumonia. Now improved and at baseline.  3. Frequent falls and possibility of syncope?[facility] MRI without acute abn. PT rec. Rehab 4.A/CKD stage IIIb- likely prerenal. Improved to baseline with IVF for gentle hydration. Encourage hydration/po intake 5.Elevated D-dimer but trending down. Ultrasound of the lower extremities negative. 6.Left hip pain. X-ray negative for fracture. Ultrasound negative for DVT. Could be bursitis from fall. Continue to work with physical therapy. Use lidocaine patch . 7.Iron deficiency anemia. started on  ferrous sulfate. Continue to monitor hemoglobin 8.Essential hypertension on clonidine and Toprol 9.frequent  urination. Needs to train bladder and use the bathroom quite often.    Discharge Diagnoses:  Active Problems:   Essential hypertension   CAP (community acquired pneumonia)   Lobar pneumonia ([place])   Acute metabolic encephalopathy   Falls frequently   Elevated d-dimer   Anemia   Left hip pain    Discharge Instructions  Discharge Instructions    Call MD for:  difficulty breathing, headache or visual disturbances   Complete by: As directed    Diet - low sodium heart healthy   Complete by: As directed    Increase activity slowly   Complete by: As directed      Allergies as of 06/01/2020      Reactions   Brilinta [ticagrelor] Other (See Comments)   "caused stent to fail"   Imdur [isosorbide Nitrate] Other (See Comments)   Headache      Medication List    TAKE these medications   acetaminophen 500 MG tablet Commonly known as: TYLENOL Take 500-1,000 mg by mouth every 6 (six) hours as needed for pain.   aspirin EC 81 MG tablet Take 81 mg by mouth daily.   atorvastatin 80 MG tablet Commonly known as: LIPITOR Take 80 mg by mouth every evening.   cloNIDine 0.1 MG tablet Commonly known as: CATAPRES Take 0.1 mg by mouth 2 (two) times daily as needed (hypertensive episode).   cycloSPORINE 0.05 % ophthalmic emulsion Commonly known as: RESTASIS Place 1 drop into both eyes 2 (two) times daily.   DULoxetine 30 MG capsule Commonly known as: CYMBALTA Take 30 mg by mouth every evening.  ferrous sulfate 325 (65 FE) MG tablet Take 1 tablet (325 mg total) by mouth daily with breakfast. Start taking on: June 02, 2020   Gemtesa 75 MG Tabs Generic drug: Vibegron Take 75 mg by mouth daily.   lamoTRIgine 25 MG tablet Commonly known as: LAMICTAL Take 25 mg by mouth every evening.   levofloxacin 750 MG tablet Commonly known as: LEVAQUIN Take 1 tablet (750 mg total) by mouth daily for 1 dose. Start tomorrow, 1/26 Start taking on: June 02, 2020   lidocaine 5  % Commonly known as: [place] 1 patch onto the skin daily. Remove & Discard patch within 12 hours or as directed by MD   metoprolol succinate 25 MG 24 hr tablet Commonly known as: TOPROL-XL Take 25 mg by mouth every evening.   nitroGLYCERIN 0.4 MG SL tablet Commonly known as: NITROSTAT Place 0.4 mg under the tongue every 5 (five) minutes x 3 doses as needed for chest pain.   ranolazine 1000 MG SR tablet Commonly known as: RANEXA Take 1,000 mg by mouth 2 (two) times daily.       Contact information for follow-up providers    Tama High III, MD Follow up in 1 week(s).   Specialty: Internal Medicine Contact information: [place]  [phone] [phone]            Contact information for after-discharge care    Destination    [facility] Preferred SNF .   Service: Skilled Nursing Contact information: [place] [phone]                 Allergies  Allergen Reactions  . Brilinta [Ticagrelor] Other (See Comments)    "caused stent to fail"  . Imdur [Isosorbide Nitrate] Other (See Comments)    Headache    Consultations:     Procedures/Studies: DG Chest 2 View  Result Date: 05/28/2020 CLINICAL DATA:  Shortness of breath EXAM: CHEST - 2 VIEW COMPARISON:  February 09, 2020 FINDINGS: There is airspace opacity throughout much of the left upper lobe without significant consolidation. Lungs elsewhere clear. Heart size and pulmonary vascularity are normal. Patient is status post coronary artery bypass grafting. No adenopathy. There is aortic atherosclerosis. No bone lesions. IMPRESSION: Airspace opacity consistent with pneumonia left upper lobe. Question atypical organism pneumonia given this appearance. Advise check of COVID-19 status. Lungs elsewhere clear. Status post coronary artery bypass grafting. Heart size normal. No adenopathy. Aortic Atherosclerosis  (ICD10-I70.0). Electronically Signed   By: Lowella Grip III M.D.   On: 05/28/2020 10:34   CT Head Wo Contrast  Result Date: 05/28/2020 CLINICAL DATA:  Pain following fall EXAM: CT HEAD WITHOUT CONTRAST TECHNIQUE: Contiguous axial images were obtained from the base of the skull through the vertex without intravenous contrast. COMPARISON:  December 31, 2019 FINDINGS: Brain: There is stable age related volume loss. There is no intracranial mass, hemorrhage, extra-axial fluid collection, or midline shift. There is patchy small vessel disease in the centra semiovale bilaterally, stable. No acute infarct is evident. Vascular: No hyperdense vessel. Foci of calcification noted in the distal left vertebral artery and in each carotid siphon region. Skull: The bony calvarium appears intact. Sinuses/Orbits: There is mucosal thickening in multiple ethmoid air cells. Orbits appear symmetric bilaterally. Other: Visualized mastoid air cells are clear. IMPRESSION: Age related volume loss with stable patchy periventricular small vessel disease. No acute infarct. No mass or hemorrhage. There are foci of arterial  vascular calcification. There is mucosal thickening in several ethmoid air cells. Electronically Signed   By: Lowella Grip III M.D.   On: 05/28/2020 10:36   MR BRAIN WO CONTRAST  Result Date: 05/29/2020 CLINICAL DATA:  Neuro deficit, acute, stroke suspected EXAM: MRI HEAD WITHOUT CONTRAST TECHNIQUE: Multiplanar, multiecho pulse sequences of the brain and surrounding structures were obtained without intravenous contrast. COMPARISON:  05/28/2020 and prior. FINDINGS: Please note that image quality is degraded by motion artifact. Brain: No diffusion-weighted signal abnormality. Left cerebral microhemorrhage. No midline shift, ventriculomegaly or extra-axial fluid collection. No mass lesion. Mild cerebral atrophy with ex vacuo dilatation. Moderate chronic microvascular ischemic changes. Sequela of remote right  temporal insult. Vascular: Major intracranial flow voids are grossly preserved proximally. Skull and upper cervical spine: No acute finding. Sinuses/Orbits: No acute finding. Other: Incomplete FLAIR suppression. IMPRESSION: No acute intracranial process.  Remote right temporal insult. Mild cerebral atrophy and moderate chronic microvascular ischemic changes. Motion degraded exam. Electronically Signed   By: Primitivo Gauze M.D.   On: 05/29/2020 14:11   CT ABDOMEN PELVIS W CONTRAST  Result Date: 05/28/2020 CLINICAL DATA:  Recent fall with abdominal pain, initial encounter EXAM: CT ABDOMEN AND PELVIS WITH CONTRAST TECHNIQUE: Multidetector CT imaging of the abdomen and pelvis was performed using the standard protocol following bolus administration of intravenous contrast. CONTRAST:  49mL OMNIPAQUE IOHEXOL 300 MG/ML  SOLN COMPARISON:  Chest x-ray from earlier in the same day. FINDINGS: Lower chest: Small bilateral pleural effusions are noted. Bibasilar atelectatic changes are again seen similar to that noted on the prior plain film of the chest. Loculated fluid is noted adjacent to the distal descending thoracic aorta consistent with small loculated effusion. Hepatobiliary: Gallbladder is well distended although no cholelithiasis is seen. The liver is within normal limits. Pancreas: Unremarkable. No pancreatic ductal dilatation or surrounding inflammatory changes. Spleen: Normal in size without focal abnormality. Adrenals/Urinary Tract: Adrenal glands are within normal limits. Right kidney demonstrates no mass lesion or hydronephrosis. Small lower pole cyst is noted. Left kidney demonstrates exophytic cyst from the upper pole measuring 5 cm. Smaller cysts are noted. No renal calculi or obstructive changes are noted. The bladder is well distended. Stomach/Bowel: Diverticular change of the colon is noted without evidence of definitive diverticulitis. Considerable retained fecal material is noted throughout the  colon consistent with at least a mild degree of constipation. The appendix is not well visualized and may have been surgically removed. No inflammatory changes are seen. The stomach is within normal limits. No small bowel abnormality is seen. Vascular/Lymphatic: Aortic atherosclerosis. No enlarged abdominal or pelvic lymph nodes. Reproductive: Uterus has been surgically removed. Cystic changes are noted in the right ovary. These appear simple in nature. The largest of these measures 3.1 Cm in dimension. Other: No abdominal wall hernia or abnormality. No abdominopelvic ascites. Musculoskeletal: Bilateral hip replacements are seen. No compression deformities are noted. Mild degenerative changes of lumbar spine are noted. IMPRESSION: Small bilateral pleural effusions with bibasilar atelectatic changes similar to that seen on prior chest x-ray. Bilateral renal cysts simple in nature. Diverticulosis without diverticulitis. Cystic changes in the right ovary. The uterus has been surgically removed. Recommend follow-up US in 6-12 months. Note: This recommendation does not apply to premenarchal patients and to those with increased risk (genetic, family history, elevated tumor markers or other high-risk factors) of ovarian cancer. Reference: JACR 2020 Feb; 17(2):248-254 Electronically Signed   By: Inez Catalina M.D.   On: 05/28/2020 15:19   US Venous Img Lower  Bilateral (DVT)  Result Date: 05/29/2020 CLINICAL DATA:  Elevated D-dimer, lower extremity pain and edema EXAM: BILATERAL LOWER EXTREMITY VENOUS DOPPLER ULTRASOUND TECHNIQUE: Gray-scale sonography with graded compression, as well as color Doppler and duplex ultrasound were performed to evaluate the lower extremity deep venous systems from the level of the common femoral vein and including the common femoral, femoral, profunda femoral, popliteal and calf veins including the posterior tibial, peroneal and gastrocnemius veins when visible. The superficial great  saphenous vein was also interrogated. Spectral Doppler was utilized to evaluate flow at rest and with distal augmentation maneuvers in the common femoral, femoral and popliteal veins. COMPARISON:  None. FINDINGS: RIGHT LOWER EXTREMITY Common Femoral Vein: No evidence of thrombus. Normal compressibility, respiratory phasicity and response to augmentation. Saphenofemoral Junction: No evidence of thrombus. Normal compressibility and flow on color Doppler imaging. Profunda Femoral Vein: No evidence of thrombus. Normal compressibility and flow on color Doppler imaging. Femoral Vein: No evidence of thrombus. Normal compressibility, respiratory phasicity and response to augmentation. Popliteal Vein: No evidence of thrombus. Normal compressibility, respiratory phasicity and response to augmentation. Calf Veins: No evidence of thrombus. Normal compressibility and flow on color Doppler imaging. LEFT LOWER EXTREMITY Common Femoral Vein: No evidence of thrombus. Normal compressibility, respiratory phasicity and response to augmentation. Saphenofemoral Junction: No evidence of thrombus. Normal compressibility and flow on color Doppler imaging. Profunda Femoral Vein: No evidence of thrombus. Normal compressibility and flow on color Doppler imaging. Femoral Vein: No evidence of thrombus. Normal compressibility, respiratory phasicity and response to augmentation. Popliteal Vein: No evidence of thrombus. Normal compressibility, respiratory phasicity and response to augmentation. Calf Veins: No evidence of thrombus. Normal compressibility and flow on color Doppler imaging. IMPRESSION: No evidence of deep venous thrombosis in either lower extremity. Electronically Signed   By: Jerilynn Mages.  Shick M.D.   On: 05/29/2020 09:55   DG Hip Unilat W or Wo Pelvis 2-3 Views Left  Result Date: 05/28/2020 CLINICAL DATA:  Left hip pain after fall. EXAM: DG HIP (WITH OR WITHOUT PELVIS) 2-3V LEFT COMPARISON:  None. FINDINGS: Status post bilateral total  hip arthroplasties. No fracture or dislocation is noted. Vascular calcifications are noted. IMPRESSION: No acute abnormality seen in the left hip. Electronically Signed   By: Marijo Conception M.D.   On: 05/28/2020 13:56      Subjective: No cp or sob. Daughter at bedside  Discharge Exam: Vitals:   06/01/20 1025 06/01/20 1213  BP: (!) 138/46 (!) 156/63  Pulse: 67 67  Resp: 16 16  Temp: 97.7 F (36.5 C) 98.2 F (36.8 C)  SpO2: 100% 98%   Vitals:   06/01/20 1023 06/01/20 1024 06/01/20 1025 06/01/20 1213  BP:   (!) 138/46 (!) 156/63  Pulse:   67 67  Resp:   16 16  Temp:   97.7 F (36.5 C) 98.2 F (36.8 C)  TempSrc:   Oral Oral  SpO2: 96% 96% 100% 98%  Weight:      Height:        General: Pt is alert, awake x0x3, not in acute distress Cardiovascular: RRR, S1/S2 +, no rubs, no gallops Respiratory: CTA bilaterally, no wheezing, no rhonchi Abdominal: Soft, NT, ND, bowel sounds + Extremities: no edema, no cyanosis    The results of significant diagnostics from this hospitalization (including imaging, microbiology, ancillary and laboratory) are listed below for reference.     Microbiology: Recent Results (from the past 240 hour(s))  Urine culture     Status: None   Collection Time:  05/28/20 11:19 AM   Specimen: Urine, Clean Catch  Result Value Ref Range Status   Specimen Description   Final    URINE, CLEAN CATCH Performed at [facility], [address] [place], [place] [address]    Special Requests   Final    NONE Performed at [facility], [address], [place], [place] [address]    Culture   Final    NO GROWTH Performed at [facility], [place] [address], [place], [place] [address]    Report Status 05/30/2020 FINAL  Final  Blood culture (single)     Status: None (Preliminary result)   Collection Time: 05/28/20  1:33 PM   Specimen: BLOOD  Result Value Ref Range Status   Specimen Description BLOOD RIGHT ANTECUBITAL  Final   Special  Requests   Final    BOTTLES DRAWN AEROBIC AND ANAEROBIC Blood Culture adequate volume   Culture   Final    NO GROWTH 4 DAYS Performed at [facility], [address] [place], [place] [address]    Report Status PENDING  Incomplete  SARS CORONAVIRUS 2 (TAT 6-24 HRS) Nasopharyngeal Nasopharyngeal Swab     Status: None   Collection Time: 05/28/20  2:38 PM   Specimen: Nasopharyngeal Swab  Result Value Ref Range Status   SARS Coronavirus 2 NEGATIVE NEGATIVE Final    Comment: (NOTE) SARS-CoV-2 target nucleic acids are NOT DETECTED.  The SARS-CoV-2 RNA is generally detectable in upper and lower respiratory specimens during the acute phase of infection. Negative results do not preclude SARS-CoV-2 infection, do not rule out co-infections with other pathogens, and should not be used as the sole basis for treatment or other patient management decisions. Negative results must be combined with clinical observations, patient history, and epidemiological information. The expected result is Negative.  Fact Sheet for Patients: SugarRoll.be  Fact Sheet for Healthcare Providers: https://www.woods-mathews.com/  This test is not yet approved or cleared by the [place] FDA and  has been authorized for detection and/or diagnosis of SARS-CoV-2 by FDA under an Emergency Use Authorization (EUA). This EUA will remain  in effect (meaning this test can be used) for the duration of the COVID-19 declaration under Se ction 564(b)(1) of the Act, 21 U.S.C. section 360bbb-3(b)(1), unless the authorization is terminated or revoked sooner.  Performed at [facility], [place] [address] [place], [place] [address]   Culture, blood (routine x 2) Call MD if unable to obtain prior to antibiotics being given     Status: None (Preliminary result)   Collection Time: 05/28/20  8:02 PM   Specimen: BLOOD  Result Value Ref Range Status   Specimen Description  BLOOD BLOOD RIGHT FOREARM  Final   Special Requests   Final    BOTTLES DRAWN AEROBIC AND ANAEROBIC Blood Culture adequate volume   Culture   Final    NO GROWTH 4 DAYS Performed at [facility], [address] [place], [place] [address]    Report Status PENDING  Incomplete  Culture, blood (routine x 2) Call MD if unable to obtain prior to antibiotics being given     Status: None (Preliminary result)   Collection Time: 05/28/20  8:09 PM   Specimen: BLOOD  Result Value Ref Range Status   Specimen Description BLOOD RIGHT ANTECUBITAL  Final   Special Requests   Final    BOTTLES DRAWN AEROBIC AND ANAEROBIC Blood Culture adequate volume   Culture   Final    NO  GROWTH 4 DAYS Performed at [facility], [address] [place], [place] [address]    Report Status PENDING  Incomplete     Labs: BNP (last 3 results) No results for input(s): BNP in the last 8760 hours. Basic Metabolic Panel: Recent Labs  Lab 05/28/20 1004 05/29/20 0500 05/30/20 0521 05/31/20 0254 06/01/20 0525  NA 135 136 138 138 137  K 4.5 4.1 4.1 3.9 3.8  CL 104 104 108 106 107  CO2 19* 19* 20* 20* 19*  GLUCOSE 128* 109* 96 94 99  BUN 39* 30* 36* 41* 33*  CREATININE 1.38* 1.30* 1.48* 1.78* 1.42*  CALCIUM 9.7 9.4 8.8* 8.9 8.8*   Liver Function Tests: Recent Labs  Lab 05/29/20 0500  AST 39  ALT 18  ALKPHOS 60  BILITOT 1.2  PROT 6.9  ALBUMIN 3.9   No results for input(s): LIPASE, AMYLASE in the last 168 hours. No results for input(s): AMMONIA in the last 168 hours. CBC: Recent Labs  Lab 05/28/20 1004 05/29/20 0500 05/30/20 0521 05/31/20 0254  WBC 7.5 8.0 4.8 5.6  HGB 10.9* 9.6* 8.4* 8.2*  HCT 33.4* 30.4* 25.5* 25.0*  MCV 100.0 101.3* 99.6 100.0  PLT 238 224 183 175   Cardiac Enzymes: Recent Labs  Lab 05/29/20 0500 05/31/20 0254  CKTOTAL 370* 213   BNP: Invalid input(s): POCBNP CBG: No results for input(s): GLUCAP in the last 168 hours. D-Dimer No results for  input(s): DDIMER in the last 72 hours. Hgb A1c No results for input(s): HGBA1C in the last 72 hours. Lipid Profile No results for input(s): CHOL, HDL, LDLCALC, TRIG, CHOLHDL, LDLDIRECT in the last 72 hours. Thyroid function studies No results for input(s): TSH, T4TOTAL, T3FREE, THYROIDAB in the last 72 hours.  Invalid input(s): FREET3 Anemia work up Recent Labs    05/30/20 0521  VITAMINB12 324  FERRITIN 58  TIBC 323  IRON 25*   Urinalysis    Component Value Date/Time   COLORURINE YELLOW (A) 05/28/2020 1137   APPEARANCEUR CLEAR (A) 05/28/2020 1137   APPEARANCEUR Hazy 03/16/2014 1156   LABSPEC 1.016 05/28/2020 1137   LABSPEC 1.018 03/16/2014 1156   PHURINE 5.0 05/28/2020 1137   GLUCOSEU NEGATIVE 05/28/2020 1137   GLUCOSEU Negative 03/16/2014 1156   HGBUR NEGATIVE 05/28/2020 1137   BILIRUBINUR NEGATIVE 05/28/2020 1137   BILIRUBINUR Negative 03/16/2014 1156   KETONESUR NEGATIVE 05/28/2020 1137   PROTEINUR 30 (A) 05/28/2020 1137   NITRITE NEGATIVE 05/28/2020 1137   LEUKOCYTESUR NEGATIVE 05/28/2020 1137   LEUKOCYTESUR Trace 03/16/2014 1156   Sepsis Labs Invalid input(s): PROCALCITONIN,  WBC,  LACTICIDVEN Microbiology Recent Results (from the past 240 hour(s))  Urine culture     Status: None   Collection Time: 05/28/20 11:19 AM   Specimen: Urine, Clean Catch  Result Value Ref Range Status   Specimen Description   Final    URINE, CLEAN CATCH Performed at [facility], [address] [place], [place] [address]    Special Requests   Final    NONE Performed at [facility], [address], [place], [place] [address]    Culture   Final    NO GROWTH Performed at [facility], [place] [address], [place],  [address]    Report Status 05/30/2020 FINAL  Final  Blood culture (single)     Status: None (Preliminary result)   Collection Time: 05/28/20  1:33 PM   Specimen: BLOOD  Result Value Ref Range Status   Specimen Description BLOOD RIGHT  ANTECUBITAL  Final   Special Requests   Final    BOTTLES DRAWN AEROBIC AND ANAEROBIC Blood Culture adequate volume   Culture   Final    NO GROWTH 4 DAYS Performed at [facility], [address] [place], [place] [address]    Report Status PENDING  Incomplete  SARS CORONAVIRUS 2 (TAT 6-24 HRS) Nasopharyngeal Nasopharyngeal Swab     Status: None   Collection Time: 05/28/20  2:38 PM   Specimen: Nasopharyngeal Swab  Result Value Ref Range Status   SARS Coronavirus 2 NEGATIVE NEGATIVE Final    Comment: (NOTE) SARS-CoV-2 target nucleic acids are NOT DETECTED.  The SARS-CoV-2 RNA is generally detectable in upper and lower respiratory specimens during the acute phase of infection. Negative results do not preclude SARS-CoV-2 infection, do not rule out co-infections with other pathogens, and should not be used as the sole basis for treatment or other patient management decisions. Negative results must be combined with clinical observations, patient history, and epidemiological information. The expected result is Negative.  Fact Sheet for Patients: SugarRoll.be  Fact Sheet for Healthcare Providers: https://www.woods-mathews.com/  This test is not yet approved or cleared by the [place] FDA and  has been authorized for detection and/or diagnosis of SARS-CoV-2 by FDA under an Emergency Use Authorization (EUA). This EUA will remain  in effect (meaning this test can be used) for the duration of the COVID-19 declaration under Se ction 564(b)(1) of the Act, 21 U.S.C. section 360bbb-3(b)(1), unless the authorization is terminated or revoked sooner.  Performed at [facility], [place] [address] [place], [place] [address]   Culture, blood (routine x 2) Call MD if unable to obtain prior to antibiotics being given     Status: None (Preliminary result)   Collection Time: 05/28/20  8:02 PM   Specimen: BLOOD  Result Value Ref Range  Status   Specimen Description BLOOD BLOOD RIGHT FOREARM  Final   Special Requests   Final    BOTTLES DRAWN AEROBIC AND ANAEROBIC Blood Culture adequate volume   Culture   Final    NO GROWTH 4 DAYS Performed at [facility], [address] [place], [place] [address]    Report Status PENDING  Incomplete  Culture, blood (routine x 2) Call MD if unable to obtain prior to antibiotics being given     Status: None (Preliminary result)   Collection Time: 05/28/20  8:09 PM   Specimen: BLOOD  Result Value Ref Range Status   Specimen Description BLOOD RIGHT ANTECUBITAL  Final   Special Requests   Final    BOTTLES DRAWN AEROBIC AND ANAEROBIC Blood Culture adequate volume   Culture   Final    NO GROWTH 4 DAYS Performed at [facility], [address], [place], [place] [address]    Report Status PENDING  Incomplete     Time coordinating discharge: Over 30 minutes  SIGNED:   Nolberto Hanlon, MD  [facility] 06/01/2020, 12:15 PM Pager   If 7PM-7AM, please contact night-coverage [email or web address] Password [email or web address]

## 2020-06-02 LAB — CULTURE, BLOOD (ROUTINE X 2)
Culture: NO GROWTH
Culture: NO GROWTH
Special Requests: ADEQUATE
Special Requests: ADEQUATE

## 2020-06-02 LAB — CULTURE, BLOOD (SINGLE)
Culture: NO GROWTH
Special Requests: ADEQUATE

## 2020-06-10 ENCOUNTER — Ambulatory Visit: Payer: Self-pay | Admitting: Physician Assistant

## 2020-06-16 ENCOUNTER — Emergency Department: Payer: Medicare Other

## 2020-06-16 ENCOUNTER — Observation Stay
Admit: 2020-06-16 | Discharge: 2020-06-16 | Disposition: A | Discharge status: Home / Self Care | Payer: Medicare Other | Attending: Internal Medicine | Admitting: Internal Medicine

## 2020-06-16 ENCOUNTER — Observation Stay
Admission: EM | Admit: 2020-06-16 | Discharge: 2020-06-17 | Disposition: A | Discharge status: Home / Self Care | Payer: Medicare Other | Attending: Internal Medicine | Admitting: Internal Medicine

## 2020-06-16 DIAGNOSIS — Z79899 Other long term (current) drug therapy: Secondary | ICD-10-CM | POA: Insufficient documentation

## 2020-06-16 DIAGNOSIS — E441 Mild protein-calorie malnutrition: Secondary | ICD-10-CM | POA: Diagnosis not present

## 2020-06-16 DIAGNOSIS — I209 Angina pectoris, unspecified: Secondary | ICD-10-CM | POA: Insufficient documentation

## 2020-06-16 DIAGNOSIS — Z20822 Contact with and (suspected) exposure to covid-19: Secondary | ICD-10-CM | POA: Diagnosis not present

## 2020-06-16 DIAGNOSIS — R5381 Other malaise: Secondary | ICD-10-CM | POA: Diagnosis present

## 2020-06-16 DIAGNOSIS — N1831 Chronic kidney disease, stage 3a: Secondary | ICD-10-CM

## 2020-06-16 DIAGNOSIS — N3281 Overactive bladder: Secondary | ICD-10-CM | POA: Diagnosis not present

## 2020-06-16 DIAGNOSIS — I129 Hypertensive chronic kidney disease with stage 1 through stage 4 chronic kidney disease, or unspecified chronic kidney disease: Secondary | ICD-10-CM | POA: Insufficient documentation

## 2020-06-16 DIAGNOSIS — R296 Repeated falls: Secondary | ICD-10-CM | POA: Diagnosis not present

## 2020-06-16 DIAGNOSIS — M160 Bilateral primary osteoarthritis of hip: Secondary | ICD-10-CM

## 2020-06-16 DIAGNOSIS — E46 Unspecified protein-calorie malnutrition: Secondary | ICD-10-CM

## 2020-06-16 DIAGNOSIS — Z791 Long term (current) use of non-steroidal anti-inflammatories (NSAID): Secondary | ICD-10-CM | POA: Insufficient documentation

## 2020-06-16 DIAGNOSIS — N1832 Chronic kidney disease, stage 3b: Secondary | ICD-10-CM | POA: Diagnosis not present

## 2020-06-16 DIAGNOSIS — R079 Chest pain, unspecified: Secondary | ICD-10-CM | POA: Diagnosis not present

## 2020-06-16 DIAGNOSIS — Z951 Presence of aortocoronary bypass graft: Secondary | ICD-10-CM | POA: Diagnosis not present

## 2020-06-16 DIAGNOSIS — N189 Chronic kidney disease, unspecified: Secondary | ICD-10-CM | POA: Diagnosis present

## 2020-06-16 DIAGNOSIS — E785 Hyperlipidemia, unspecified: Secondary | ICD-10-CM | POA: Diagnosis not present

## 2020-06-16 DIAGNOSIS — M169 Osteoarthritis of hip, unspecified: Secondary | ICD-10-CM | POA: Diagnosis present

## 2020-06-16 DIAGNOSIS — E782 Mixed hyperlipidemia: Secondary | ICD-10-CM

## 2020-06-16 LAB — CBC WITH DIFFERENTIAL/PLATELET
Abs Immature Granulocytes: 0.01 10*3/uL (ref 0.00–0.07)
Basophils Absolute: 0 10*3/uL (ref 0.0–0.1)
Basophils Relative: 1 %
Eosinophils Absolute: 0 10*3/uL (ref 0.0–0.5)
Eosinophils Relative: 1 %
HCT: 30.5 % — ABNORMAL LOW (ref 36.0–46.0)
Hemoglobin: 10.1 g/dL — ABNORMAL LOW (ref 12.0–15.0)
Immature Granulocytes: 0 %
Lymphocytes Relative: 24 %
Lymphs Abs: 1.1 10*3/uL (ref 0.7–4.0)
MCH: 33 pg (ref 26.0–34.0)
MCHC: 33.1 g/dL (ref 30.0–36.0)
MCV: 99.7 fL (ref 80.0–100.0)
Monocytes Absolute: 0.5 10*3/uL (ref 0.1–1.0)
Monocytes Relative: 11 %
Neutro Abs: 2.8 10*3/uL (ref 1.7–7.7)
Neutrophils Relative %: 63 %
Platelets: 298 10*3/uL (ref 150–400)
RBC: 3.06 MIL/uL — ABNORMAL LOW (ref 3.87–5.11)
RDW: 12.6 % (ref 11.5–15.5)
WBC: 4.4 10*3/uL (ref 4.0–10.5)
nRBC: 0 % (ref 0.0–0.2)

## 2020-06-16 LAB — BASIC METABOLIC PANEL
Anion gap: 10 (ref 5–15)
BUN: 26 mg/dL — ABNORMAL HIGH (ref 8–23)
CO2: 22 mmol/L (ref 22–32)
Calcium: 9 mg/dL (ref 8.9–10.3)
Chloride: 104 mmol/L (ref 98–111)
Creatinine, Ser: 1.34 mg/dL — ABNORMAL HIGH (ref 0.44–1.00)
GFR, Estimated: 38 mL/min — ABNORMAL LOW (ref >60)
Glucose, Bld: 115 mg/dL — ABNORMAL HIGH (ref 70–99)
Potassium: 4.4 mmol/L (ref 3.5–5.1)
Sodium: 136 mmol/L (ref 135–145)

## 2020-06-16 LAB — RESP PANEL BY RT-PCR (FLU A&B, COVID) ARPGX2
Influenza A by PCR: NEGATIVE
Influenza B by PCR: NEGATIVE
SARS Coronavirus 2 by RT PCR: NEGATIVE

## 2020-06-16 LAB — TROPONIN I (HIGH SENSITIVITY)
Troponin I (High Sensitivity): 11 ng/L (ref <18)
Troponin I (High Sensitivity): 11 ng/L (ref <18)

## 2020-06-16 MED ORDER — ASPIRIN EC 81 MG PO TBEC
81.0000 mg | DELAYED_RELEASE_TABLET | Freq: Every day | ORAL | Status: DC
Start: 2020-06-16 — End: 2020-06-17
  Administered 2020-06-17: 81 mg via ORAL
  Filled 2020-06-16 (×2): qty 1

## 2020-06-16 MED ORDER — RANOLAZINE ER 500 MG PO TB12
1000.0000 mg | ORAL_TABLET | Freq: Two times a day (BID) | ORAL | Status: DC
  Administered 2020-06-16 – 2020-06-17 (×2): 1000 mg via ORAL
  Filled 2020-06-16 (×4): qty 2

## 2020-06-16 MED ORDER — AMLODIPINE BESYLATE 5 MG PO TABS
2.5000 mg | ORAL_TABLET | Freq: Every day | ORAL | Status: DC
  Administered 2020-06-16 – 2020-06-17 (×2): 2.5 mg via ORAL
  Filled 2020-06-16 (×2): qty 1

## 2020-06-16 MED ORDER — IOHEXOL 350 MG/ML SOLN
60.0000 mL | Freq: Once | INTRAVENOUS | Status: AC | PRN
  Administered 2020-06-16: 60 mL via INTRAVENOUS

## 2020-06-16 MED ORDER — ACETAMINOPHEN 325 MG PO TABS
325.0000 mg | ORAL_TABLET | ORAL | Status: DC | PRN
  Administered 2020-06-16 – 2020-06-17 (×2): 325 mg via ORAL
  Filled 2020-06-16 (×3): qty 1

## 2020-06-16 MED ORDER — LIDOCAINE 5 % EX PTCH
1.0000 | MEDICATED_PATCH | CUTANEOUS | Status: DC
  Administered 2020-06-16 – 2020-06-17 (×2): 1 via TRANSDERMAL
  Filled 2020-06-16 (×2): qty 1

## 2020-06-16 MED ORDER — ATORVASTATIN CALCIUM 20 MG PO TABS
80.0000 mg | ORAL_TABLET | Freq: Every evening | ORAL | Status: DC
  Administered 2020-06-16: 80 mg via ORAL
  Filled 2020-06-16: qty 4

## 2020-06-16 MED ORDER — NITROGLYCERIN 0.4 MG SL SUBL
0.4000 mg | SUBLINGUAL_TABLET | SUBLINGUAL | Status: DC | PRN
  Administered 2020-06-16: 0.4 mg via SUBLINGUAL
  Filled 2020-06-16: qty 1

## 2020-06-16 MED ORDER — VIBEGRON 75 MG PO TABS: 75.0000 mg | ORAL_TABLET | Freq: Every day | ORAL | Status: DC

## 2020-06-16 MED ORDER — ONDANSETRON HCL 4 MG/2ML IJ SOLN
4.0000 mg | Freq: Once | INTRAMUSCULAR | Status: AC
  Administered 2020-06-16: 4 mg via INTRAVENOUS
  Filled 2020-06-16: qty 2

## 2020-06-16 MED ORDER — LAMOTRIGINE 25 MG PO TABS
25.0000 mg | ORAL_TABLET | Freq: Every evening | ORAL | Status: DC
  Administered 2020-06-16: 25 mg via ORAL
  Filled 2020-06-16: qty 1

## 2020-06-16 MED ORDER — ONDANSETRON HCL 4 MG/2ML IJ SOLN: 4.0000 mg | Freq: Four times a day (QID) | INTRAMUSCULAR | Status: DC | PRN

## 2020-06-16 MED ORDER — ENOXAPARIN SODIUM 30 MG/0.3ML ~~LOC~~ SOLN
30.0000 mg | SUBCUTANEOUS | Status: DC
  Filled 2020-06-16: qty 0.3

## 2020-06-16 MED ORDER — DULOXETINE HCL 30 MG PO CPEP
30.0000 mg | ORAL_CAPSULE | Freq: Every evening | ORAL | Status: DC
  Administered 2020-06-16: 30 mg via ORAL
  Filled 2020-06-16: qty 1

## 2020-06-16 MED ORDER — METOPROLOL SUCCINATE ER 25 MG PO TB24
25.0000 mg | ORAL_TABLET | Freq: Every evening | ORAL | Status: DC
  Administered 2020-06-16: 25 mg via ORAL
  Filled 2020-06-16: qty 1

## 2020-06-16 NOTE — Consult Note (Signed)
CARDIOLOGY CONSULT NOTE               Patient ID: ZIAN MOHAMED MRN: [id number] DOB/AGE: 1930/06/21 87 y.o.  Admit date: 06/16/2020 Referring Physician Dr. Rupert Stacks hospitalist Primary Physician Dr. Kerrin Mo primary Primary Cardiologist [facility] Reason for Consultation chest pain possible angina known coronary disease generalized weakness  HPI: Patient is a 87 year old female well-known to me with multiple medical problems coronary disease coronary bypass surgery PCI and stent heart failure multiple falls subdural hematoma generalized weakness renal insufficiency hypertension hyperlipidemia shortness of breath dyspnea GERD reportedly had an episode of chest pressure at [facility] did not respond to sublingual nitroglycerin so she came to the emergency room by time she got the emergency room chest pain symptoms had improved EKG was unremarkable troponins were negative.  Patient was admitted for further assessment.  Review of systems complete and found to be negative unless listed above     Past Medical History:  Diagnosis Date  . Anemia   . Arthritis   . Blood dyscrasia    since being put on Brilinta after stent placement developing hematoma  . Cancer ([place])    basal cell left leg  . CHF (congestive heart failure) ([place])   . GERD (gastroesophageal reflux disease)   . Headache    h/o migraines  . Hyperlipidemia   . Hypertension   . Leukopenia   . MI (myocardial infarction) ([place]) 02/2015   s/p 4 stents and CABG in 2017  . Renal disorder    stage 3-Dr Caryl Comes keeping a check on kidney function  . Shortness of breath dyspnea    mild compared to prior stenting-pt states she would have to stop and rest if walking a mile  . Thyroid disease     Past Surgical History:  Procedure Laterality Date  . ABDOMINAL HYSTERECTOMY    . CARDIAC CATHETERIZATION N/A 12/08/2015   Procedure: Left Heart Cath and Coronary Angiography;  Surgeon: Yolonda Kida, MD;  Location:  [place] [facility];  Service: Cardiovascular;  Laterality: N/A;  . CORONARY ANGIOPLASTY WITH STENT PLACEMENT    . CORONARY STENT PLACEMENT  2016   x2  . EYE SURGERY Bilateral 2011  . INCISION AND DRAINAGE Left 08/26/2015   Procedure: INCISION AND DRAINAGE / HAND HEMATOMA;  Surgeon: Hessie Knows, MD;  Location: [facility];  Service: Orthopedics;  Laterality: Left;  . JOINT REPLACEMENT Bilateral    left 2016 and right 2015  . TONSILLECTOMY     3rd grade    Medications Prior to Admission  Medication Sig Dispense Refill Last Dose  . acetaminophen (TYLENOL) 500 MG tablet Take 500-1,000 mg by mouth every 6 (six) hours as needed for pain.   Past Week at Unknown time  . aspirin EC 81 MG tablet Take 81 mg by mouth daily.   06/15/2020 at Unknown time  . atorvastatin (LIPITOR) 80 MG tablet Take 80 mg by mouth every evening.    06/15/2020 at Unknown time  . cycloSPORINE (RESTASIS) 0.05 % ophthalmic emulsion Place 1 drop into both eyes 2 (two) times daily.    06/15/2020 at Unknown time  . DULoxetine (CYMBALTA) 30 MG capsule Take 30 mg by mouth every evening.  2 06/15/2020 at Unknown time  . ferrous sulfate 325 (65 FE) MG tablet Take 1 tablet (325 mg total) by mouth daily with breakfast.  3 06/15/2020 at Unknown time  . lamoTRIgine (LAMICTAL) 25 MG tablet Take 25 mg by mouth every evening.  06/15/2020 at Unknown time  . lidocaine (LIDODERM) 5 % Place 1 patch onto the skin daily. Remove & Discard patch within 12 hours or as directed by MD 30 patch 0 Past Week at Unknown time  . metoprolol succinate (TOPROL-XL) 25 MG 24 hr tablet Take 25 mg by mouth every evening.   06/15/2020 at Unknown time  . Multiple Vitamin (MULTIVITAMIN ADULT PO) Take 1 tablet by mouth daily.   Past Week at Unknown time  . ranolazine (RANEXA) 1000 MG SR tablet Take 1,000 mg by mouth 2 (two) times daily.  11 06/15/2020 at Unknown time  . Vibegron (GEMTESA) 75 MG TABS Take 75 mg by mouth daily. 28 tablet 0 06/15/2020 at Unknown time  . nitroGLYCERIN  (NITROSTAT) 0.4 MG SL tablet Place 0.4 mg under the tongue every 5 (five) minutes x 3 doses as needed for chest pain.   PRN   Social History   Socioeconomic History  . Marital status: Married    Spouse name: Not on file  . Number of children: Not on file  . Years of education: Not on file  . Highest education level: Not on file  Occupational History  . Not on file  Tobacco Use  . Smoking status: Never Smoker  . Smokeless tobacco: Never Used  Vaping Use  . Vaping Use: Never used  Substance and Sexual Activity  . Alcohol use: No  . Drug use: No  . Sexual activity: Not on file  Other Topics Concern  . Not on file  Social History Narrative   Lives at home with husband   Independent at baseline   Social Determinants of Health   Financial Resource Strain: Not on file  Food Insecurity: Not on file  Transportation Needs: Not on file  Physical Activity: Not on file  Stress: Not on file  Social Connections: Not on file  Intimate Partner Violence: Not on file    Family History  Problem Relation Age of Onset  . Breast cancer Paternal Grandmother   . Brain cancer Mother   . Stroke Father   . Brain cancer Son       Review of systems complete and found to be negative unless listed above      PHYSICAL EXAM  General: Well developed, well nourished, in no acute distress HEENT:  Normocephalic and atramatic Neck:  No JVD.  Lungs: Clear bilaterally to auscultation and percussion. Heart: HRRR . Normal S1 and S2 without gallops or 2/6 sem murmurs.  Abdomen: Bowel sounds are positive, abdomen soft and non-tender  Msk:  Back normal, normal gait. Normal strength and tone for age. Extremities: No clubbing, cyanosis or edema.   Neuro: Alert and oriented X 3. Psych:  Good affect, responds appropriately  Labs:   Lab Results  Component Value Date   WBC 4.4 06/16/2020   HGB 10.1 (L) 06/16/2020   HCT 30.5 (L) 06/16/2020   MCV 99.7 06/16/2020   PLT 298 06/16/2020    Recent  Labs  Lab 06/16/20 1041  NA 136  K 4.4  CL 104  CO2 22  BUN 26*  CREATININE 1.34*  CALCIUM 9.0  GLUCOSE 115*   Lab Results  Component Value Date   CKTOTAL 213 05/31/2020   TROPONINI <0.03 06/27/2018   No results found for: CHOL No results found for: HDL No results found for: LDLCALC No results found for: TRIG No results found for: CHOLHDL No results found for: LDLDIRECT    Radiology: DG Chest 2 View  Result  Date: 06/16/2020 CLINICAL DATA:  Chest pain EXAM: CHEST - 2 VIEW COMPARISON:  May 28, 2020 FINDINGS: There has been interval clearing of infiltrate from the left upper lobe. Currently no edema or airspace opacity. The heart size and pulmonary vascularity are within normal limits. Patient is status post coronary artery bypass grafting. There is aortic atherosclerosis. No adenopathy. No bone lesions. IMPRESSION: Lungs now clear. Heart size normal. Postoperative changes noted. Aortic Atherosclerosis (ICD10-I70.0). Electronically Signed   By: Lowella Grip III M.D.   On: 06/16/2020 11:39   DG Chest 2 View  Result Date: 05/28/2020 CLINICAL DATA:  Shortness of breath EXAM: CHEST - 2 VIEW COMPARISON:  February 09, 2020 FINDINGS: There is airspace opacity throughout much of the left upper lobe without significant consolidation. Lungs elsewhere clear. Heart size and pulmonary vascularity are normal. Patient is status post coronary artery bypass grafting. No adenopathy. There is aortic atherosclerosis. No bone lesions. IMPRESSION: Airspace opacity consistent with pneumonia left upper lobe. Question atypical organism pneumonia given this appearance. Advise check of COVID-19 status. Lungs elsewhere clear. Status post coronary artery bypass grafting. Heart size normal. No adenopathy. Aortic Atherosclerosis (ICD10-I70.0). Electronically Signed   By: Lowella Grip III M.D.   On: 05/28/2020 10:34   CT Head Wo Contrast  Result Date: 05/28/2020 CLINICAL DATA:  Pain following fall EXAM: CT  HEAD WITHOUT CONTRAST TECHNIQUE: Contiguous axial images were obtained from the base of the skull through the vertex without intravenous contrast. COMPARISON:  December 31, 2019 FINDINGS: Brain: There is stable age related volume loss. There is no intracranial mass, hemorrhage, extra-axial fluid collection, or midline shift. There is patchy small vessel disease in the centra semiovale bilaterally, stable. No acute infarct is evident. Vascular: No hyperdense vessel. Foci of calcification noted in the distal left vertebral artery and in each carotid siphon region. Skull: The bony calvarium appears intact. Sinuses/Orbits: There is mucosal thickening in multiple ethmoid air cells. Orbits appear symmetric bilaterally. Other: Visualized mastoid air cells are clear. IMPRESSION: Age related volume loss with stable patchy periventricular small vessel disease. No acute infarct. No mass or hemorrhage. There are foci of arterial vascular calcification. There is mucosal thickening in several ethmoid air cells. Electronically Signed   By: Lowella Grip III M.D.   On: 05/28/2020 10:36   CT Angio Chest PE W/Cm &/Or Wo Cm  Result Date: 06/16/2020 CLINICAL DATA:  Chest pain for 2 days EXAM: CT ANGIOGRAPHY CHEST WITH CONTRAST TECHNIQUE: Multidetector CT imaging of the chest was performed using the standard protocol during bolus administration of intravenous contrast. Multiplanar CT image reconstructions and MIPs were obtained to evaluate the vascular anatomy. CONTRAST:  56mL OMNIPAQUE IOHEXOL 350 MG/ML SOLN COMPARISON:  Chest x-ray from earlier in the same day. FINDINGS: Cardiovascular: Thoracic aorta demonstrates atherosclerotic calcifications and changes consistent with prior coronary bypass grafting. No cardiac enlargement is seen. Heavy coronary calcifications are noted. The pulmonary artery shows a normal branching pattern without evidence of intraluminal filling defect to suggest pulmonary embolism. Mediastinum/Nodes:  Thoracic inlet is within normal limits. No hilar or mediastinal adenopathy is noted. The esophagus as visualized demonstrates mild wall thickening which may be related to reflux. Lungs/Pleura: Lungs are well aerated bilaterally. Bibasilar atelectatic changes are noted but improved when compared with the prior exam. Scattered calcified granulomas are seen. Upper Abdomen: Visualized upper abdomen is within normal limits. Stable left renal cysts are seen. Musculoskeletal: No chest wall abnormality. No acute or significant osseous findings. Review of the MIP images confirms the above  findings. IMPRESSION: No evidence of pulmonary emboli. Persistent but improved atelectasis in the bases bilaterally. Changes consistent with esophageal reflux. Aortic Atherosclerosis (ICD10-I70.0). Electronically Signed   By: Inez Catalina M.D.   On: 06/16/2020 12:58   MR BRAIN WO CONTRAST  Result Date: 05/29/2020 CLINICAL DATA:  Neuro deficit, acute, stroke suspected EXAM: MRI HEAD WITHOUT CONTRAST TECHNIQUE: Multiplanar, multiecho pulse sequences of the brain and surrounding structures were obtained without intravenous contrast. COMPARISON:  05/28/2020 and prior. FINDINGS: Please note that image quality is degraded by motion artifact. Brain: No diffusion-weighted signal abnormality. Left cerebral microhemorrhage. No midline shift, ventriculomegaly or extra-axial fluid collection. No mass lesion. Mild cerebral atrophy with ex vacuo dilatation. Moderate chronic microvascular ischemic changes. Sequela of remote right temporal insult. Vascular: Major intracranial flow voids are grossly preserved proximally. Skull and upper cervical spine: No acute finding. Sinuses/Orbits: No acute finding. Other: Incomplete FLAIR suppression. IMPRESSION: No acute intracranial process.  Remote right temporal insult. Mild cerebral atrophy and moderate chronic microvascular ischemic changes. Motion degraded exam. Electronically Signed   By: Primitivo Gauze M.D.   On: 05/29/2020 14:11   CT ABDOMEN PELVIS W CONTRAST  Result Date: 05/28/2020 CLINICAL DATA:  Recent fall with abdominal pain, initial encounter EXAM: CT ABDOMEN AND PELVIS WITH CONTRAST TECHNIQUE: Multidetector CT imaging of the abdomen and pelvis was performed using the standard protocol following bolus administration of intravenous contrast. CONTRAST:  47mL OMNIPAQUE IOHEXOL 300 MG/ML  SOLN COMPARISON:  Chest x-ray from earlier in the same day. FINDINGS: Lower chest: Small bilateral pleural effusions are noted. Bibasilar atelectatic changes are again seen similar to that noted on the prior plain film of the chest. Loculated fluid is noted adjacent to the distal descending thoracic aorta consistent with small loculated effusion. Hepatobiliary: Gallbladder is well distended although no cholelithiasis is seen. The liver is within normal limits. Pancreas: Unremarkable. No pancreatic ductal dilatation or surrounding inflammatory changes. Spleen: Normal in size without focal abnormality. Adrenals/Urinary Tract: Adrenal glands are within normal limits. Right kidney demonstrates no mass lesion or hydronephrosis. Small lower pole cyst is noted. Left kidney demonstrates exophytic cyst from the upper pole measuring 5 cm. Smaller cysts are noted. No renal calculi or obstructive changes are noted. The bladder is well distended. Stomach/Bowel: Diverticular change of the colon is noted without evidence of definitive diverticulitis. Considerable retained fecal material is noted throughout the colon consistent with at least a mild degree of constipation. The appendix is not well visualized and may have been surgically removed. No inflammatory changes are seen. The stomach is within normal limits. No small bowel abnormality is seen. Vascular/Lymphatic: Aortic atherosclerosis. No enlarged abdominal or pelvic lymph nodes. Reproductive: Uterus has been surgically removed. Cystic changes are noted in the right  ovary. These appear simple in nature. The largest of these measures 3.1 Cm in dimension. Other: No abdominal wall hernia or abnormality. No abdominopelvic ascites. Musculoskeletal: Bilateral hip replacements are seen. No compression deformities are noted. Mild degenerative changes of lumbar spine are noted. IMPRESSION: Small bilateral pleural effusions with bibasilar atelectatic changes similar to that seen on prior chest x-ray. Bilateral renal cysts simple in nature. Diverticulosis without diverticulitis. Cystic changes in the right ovary. The uterus has been surgically removed. Recommend follow-up US in 6-12 months. Note: This recommendation does not apply to premenarchal patients and to those with increased risk (genetic, family history, elevated tumor markers or other high-risk factors) of ovarian cancer. Reference: [facility] 2020 Feb; 17(2):248-254 Electronically Signed   By: Linus Mako.D.  On: 05/28/2020 15:19   US Venous Img Lower Bilateral (DVT)  Result Date: 05/29/2020 CLINICAL DATA:  Elevated D-dimer, lower extremity pain and edema EXAM: BILATERAL LOWER EXTREMITY VENOUS DOPPLER ULTRASOUND TECHNIQUE: Gray-scale sonography with graded compression, as well as color Doppler and duplex ultrasound were performed to evaluate the lower extremity deep venous systems from the level of the common femoral vein and including the common femoral, femoral, profunda femoral, popliteal and calf veins including the posterior tibial, peroneal and gastrocnemius veins when visible. The superficial great saphenous vein was also interrogated. Spectral Doppler was utilized to evaluate flow at rest and with distal augmentation maneuvers in the common femoral, femoral and popliteal veins. COMPARISON:  None. FINDINGS: RIGHT LOWER EXTREMITY Common Femoral Vein: No evidence of thrombus. Normal compressibility, respiratory phasicity and response to augmentation. Saphenofemoral Junction: No evidence of thrombus. Normal  compressibility and flow on color Doppler imaging. Profunda Femoral Vein: No evidence of thrombus. Normal compressibility and flow on color Doppler imaging. Femoral Vein: No evidence of thrombus. Normal compressibility, respiratory phasicity and response to augmentation. Popliteal Vein: No evidence of thrombus. Normal compressibility, respiratory phasicity and response to augmentation. Calf Veins: No evidence of thrombus. Normal compressibility and flow on color Doppler imaging. LEFT LOWER EXTREMITY Common Femoral Vein: No evidence of thrombus. Normal compressibility, respiratory phasicity and response to augmentation. Saphenofemoral Junction: No evidence of thrombus. Normal compressibility and flow on color Doppler imaging. Profunda Femoral Vein: No evidence of thrombus. Normal compressibility and flow on color Doppler imaging. Femoral Vein: No evidence of thrombus. Normal compressibility, respiratory phasicity and response to augmentation. Popliteal Vein: No evidence of thrombus. Normal compressibility, respiratory phasicity and response to augmentation. Calf Veins: No evidence of thrombus. Normal compressibility and flow on color Doppler imaging. IMPRESSION: No evidence of deep venous thrombosis in either lower extremity. Electronically Signed   By: Jerilynn Mages.  Shick M.D.   On: 05/29/2020 09:55   DG Hip Unilat W or Wo Pelvis 2-3 Views Left  Result Date: 05/28/2020 CLINICAL DATA:  Left hip pain after fall. EXAM: DG HIP (WITH OR WITHOUT PELVIS) 2-3V LEFT COMPARISON:  None. FINDINGS: Status post bilateral total hip arthroplasties. No fracture or dislocation is noted. Vascular calcifications are noted. IMPRESSION: No acute abnormality seen in the left hip. Electronically Signed   By: Marijo Conception M.D.   On: 05/28/2020 13:56    EKG: Normal sinus rhythm nonspecific ST-T wave changes rate of 60  ASSESSMENT AND PLAN:  Angina CAD HTN Multiple falls Poor balance Subdural hemorrhage Coronary bypass  surgery Generalized weakness Seizure-like activity Chronic renal insufficiency stage III Hyperlipidemia . Plan Follow-up EKGs and troponins Recommend medical therapy to help with possible anginal symptoms Consider therapy for possible reflux and indigestion Echocardiogram will be helpful for left ventricular function I am not in favor of an invasive strategy Functional study will not be helpful because we will treat her medically anyway so we will make medical adjustments Patient is a poor candidate for any invasive procedures so we will maintain an aggressive  medical management scheme Physical therapy will be helpful for balance strength training The goal is to probably have the patient return home but with 24-hour assistance  Signed: Yolonda Kida MD 06/16/2020, 11:28 PM

## 2020-06-16 NOTE — ED Provider Notes (Signed)
[facility] Emergency Department Provider Note   ____________________________________________   Event Date/Time   First MD Initiated Contact with Patient 06/16/20 1036     (approximate)  I have reviewed the triage vital signs and the nursing notes.   HISTORY  Chief Complaint Chest Pain   HPI ELLISSA Burns is a 87 y.o. female with past medical history of hypertension, hyperlipidemia, CAD status post CABG, CHF, CKD, GERD, and seizures who presents to the ED complaining of chest pain.  Patient reports that she initially developed pressure in the center of her chest last night.  She describes it as constant and radiating towards her left arm.  Pain is not exacerbated or alleviated by anything.  She was given 324 mg of aspirin as well as one sublingual nitro by EMS with no improvement in her pain.  She denies any associated fevers, cough, or shortness of breath.  She states her last catheterization was in April of last year and she has been doing well since then with no pain in her chest.  She was recently admitted for pneumonia and associated encephalopathy with fall, has been residing at rehab facility since then.        Past Medical History:  Diagnosis Date  . Anemia   . Arthritis   . Blood dyscrasia    since being put on Brilinta after stent placement developing hematoma  . Cancer ([place])    basal cell left leg  . CHF (congestive heart failure) ([place])   . GERD (gastroesophageal reflux disease)   . Headache    h/o migraines  . Hyperlipidemia   . Hypertension   . Leukopenia   . MI (myocardial infarction) ([place]) 02/2015   s/p 4 stents and CABG in 2017  . Renal disorder    stage 3-Dr Caryl Comes keeping a check on kidney function  . Shortness of breath dyspnea    mild compared to prior stenting-pt states she would have to stop and rest if walking a mile  . Thyroid disease     Patient Active Problem List   Diagnosis Date Noted  . Left hip pain   . Lobar  pneumonia ([place])   . Acute metabolic encephalopathy   . Falls frequently   . Elevated d-dimer   . Anemia   . CAP (community acquired pneumonia) 05/28/2020  . Overactive bladder 04/08/2020  . Urge incontinence 04/08/2020  . Nocturia 04/08/2020  . Unstable angina ([place]) 12/20/2017  . Protein-calorie malnutrition, severe 11/27/2015  . Chest pain 11/26/2015  . Chronic kidney disease 04/05/2015  . HLD (hyperlipidemia) 04/05/2015  . Essential hypertension 04/05/2015  . Decreased leukocytes 04/05/2015  . Arthritis, degenerative 04/05/2015  . Disease of thyroid gland 04/05/2015  . Acute myocardial infarction of anterior wall ([place]) 02/28/2015  . Degenerative arthritis of hip 10/31/2013    Past Surgical History:  Procedure Laterality Date  . ABDOMINAL HYSTERECTOMY    . CARDIAC CATHETERIZATION N/A 12/08/2015   Procedure: Left Heart Cath and Coronary Angiography;  Surgeon: Yolonda Kida, MD;  Location: [place] [facility];  Service: Cardiovascular;  Laterality: N/A;  . CORONARY ANGIOPLASTY WITH STENT PLACEMENT    . CORONARY STENT PLACEMENT  2016   x2  . EYE SURGERY Bilateral 2011  . INCISION AND DRAINAGE Left 08/26/2015   Procedure: INCISION AND DRAINAGE / HAND HEMATOMA;  Surgeon: Hessie Knows, MD;  Location: [facility] ORS;  Service: Orthopedics;  Laterality: Left;  . JOINT REPLACEMENT Bilateral    left 2016 and right  2015  . TONSILLECTOMY     3rd grade    Prior to Admission medications   Medication Sig Start Date End Date Taking? Authorizing Provider  acetaminophen (TYLENOL) 500 MG tablet Take 500-1,000 mg by mouth every 6 (six) hours as needed for pain.    [provider]  aspirin EC 81 MG tablet Take 81 mg by mouth daily.    [provider]  atorvastatin (LIPITOR) 80 MG tablet Take 80 mg by mouth every evening.     [provider]  cloNIDine (CATAPRES) 0.1 MG tablet Take 0.1 mg by mouth 2 (two) times daily as needed (hypertensive episode). Patient not taking:  Reported on 05/29/2020    [provider]  cycloSPORINE (RESTASIS) 0.05 % ophthalmic emulsion Place 1 drop into both eyes 2 (two) times daily.     [provider]  DULoxetine (CYMBALTA) 30 MG capsule Take 30 mg by mouth every evening.    [provider]  ferrous sulfate 325 (65 FE) MG tablet Take 1 tablet (325 mg total) by mouth daily with breakfast. 06/02/20   Nolberto Hanlon, MD  lamoTRIgine (LAMICTAL) 25 MG tablet Take 25 mg by mouth every evening.    [provider]  lidocaine (LIDODERM) 5 % Place 1 patch onto the skin daily. Remove & Discard patch within 12 hours or as directed by MD 06/01/20   Nolberto Hanlon, MD  metoprolol succinate (TOPROL-XL) 25 MG 24 hr tablet Take 25 mg by mouth every evening. 05/03/20   [provider]  nitroGLYCERIN (NITROSTAT) 0.4 MG SL tablet Place 0.4 mg under the tongue every 5 (five) minutes x 3 doses as needed for chest pain.    [provider]  ranolazine (RANEXA) 1000 MG SR tablet Take 1,000 mg by mouth 2 (two) times daily. 11/30/17   [provider]  Vibegron (GEMTESA) 75 MG TABS Take 75 mg by mouth daily. 05/10/20   Debroah Loop, PA-C    Allergies Brilinta [ticagrelor] and Imdur [isosorbide nitrate]  Family History  Problem Relation Age of Onset  . Breast cancer Paternal Grandmother   . Brain cancer Mother   . Stroke Father   . Brain cancer Son     Social History Social History   Tobacco Use  . Smoking status: Never Smoker  . Smokeless tobacco: Never Used  Vaping Use  . Vaping Use: Never used  Substance Use Topics  . Alcohol use: No  . Drug use: No    Review of Systems  Constitutional: No fever/chills Eyes: No visual changes. ENT: No sore throat. Cardiovascular: Positive for chest pain. Respiratory: Denies shortness of breath. Gastrointestinal: No abdominal pain.  No nausea, no vomiting.  No diarrhea.  No constipation. Genitourinary: Negative for dysuria. Musculoskeletal:  Negative for back pain. Skin: Negative for rash. Neurological: Negative for headaches, focal weakness or numbness.  ____________________________________________   PHYSICAL EXAM:  VITAL SIGNS: ED Triage Vitals  Enc Vitals Group     BP 06/16/20 1036 (!) 170/70     Pulse Rate 06/16/20 1036 65     Resp 06/16/20 1036 17     Temp 06/16/20 1036 98.3 F (36.8 C)     Temp Source 06/16/20 1036 Oral     SpO2 06/16/20 1036 98 %     Weight --      Height --      Head Circumference --      [place] --      Pain Score 06/16/20 1034 4  Pain Loc --      Pain Edu? --      Excl. in [place]? --     Constitutional: Alert and oriented. Eyes: Conjunctivae are normal. Head: Atraumatic. Nose: No congestion/rhinnorhea. Mouth/Throat: Mucous membranes are moist. Neck: Normal ROM Cardiovascular: Normal rate, regular rhythm. Grossly normal heart sounds.  2+ radial pulses bilaterally. Respiratory: Normal respiratory effort.  No retractions. Lungs CTAB.  No chest wall tenderness to palpation. Gastrointestinal: Soft and nontender. No distention. Genitourinary: deferred Musculoskeletal: No lower extremity tenderness nor edema. Neurologic:  Normal speech and language. No gross focal neurologic deficits are appreciated. Skin:  Skin is warm, dry and intact. No rash noted. Psychiatric: Mood and affect are normal. Speech and behavior are normal.  ____________________________________________   LABS (all labs ordered are listed, but only abnormal results are displayed)  Labs Reviewed  CBC WITH DIFFERENTIAL/PLATELET - Abnormal; Notable for the following components:      Result Value   RBC 3.06 (*)    Hemoglobin 10.1 (*)    HCT 30.5 (*)    All other components within normal limits  BASIC METABOLIC PANEL - Abnormal; Notable for the following components:   Glucose, Bld 115 (*)    BUN 26 (*)    Creatinine, Ser 1.34 (*)    GFR, Estimated 38 (*)    All other components within normal limits  RESP PANEL  BY RT-PCR (FLU A&B, COVID) [id number]  TROPONIN I (HIGH SENSITIVITY)  TROPONIN I (HIGH SENSITIVITY)   ____________________________________________  EKG  ED ECG REPORT I, Blake Divine, the attending physician, personally viewed and interpreted this ECG.   Date: 06/16/2020  EKG Time: 10:36  Rate: 66  Rhythm: normal sinus rhythm  Axis: Normal  Intervals:none  ST&T Change: None   PROCEDURES  Procedure(s) performed (including Critical Care):  Procedures   ____________________________________________   INITIAL IMPRESSION / ASSESSMENT AND PLAN / ED COURSE       87 year old female with past medical history of hypertension, hyperlipidemia, CAD status post CABG, CHF, CKD, GERD, and seizures who presents to the ED for pressure in the center of her chest starting last night and unrelenting since then.  She was previously loaded with aspirin and we will give additional nitroglycerin for her pain.  EKG does not show any arrhythmia or acute ischemic changes, troponin pending.  Symptoms sound most concerning for ACS, but if this work-up is unremarkable, patient may require further work-up for PE given her recent admission and rehab stay.  She is not currently anticoagulated.  Chest x-ray is pending but I have a low suspicion for recurrent pneumonia given she denies cough or shortness of breath.  Chest x-ray reviewed by me and shows no infiltrate, edema, or effusion.  CTA chest performed and is negative for PE or other acute process.  Troponin is within normal limits and patient reports pain is improved following a dose of nitroglycerin.  However, given her high risk status, case discussed with hospitalist for admission.      ____________________________________________   FINAL CLINICAL IMPRESSION(S) / ED DIAGNOSES  Final diagnoses:  Nonspecific chest pain  S/P CABG (coronary artery bypass graft)     ED Discharge Orders    None       Note:  This document was prepared using  Dragon voice recognition software and may include unintentional dictation errors.   Blake Divine, MD 06/16/20 [id number]

## 2020-06-16 NOTE — ED Notes (Signed)
Pt assisted to restroom and repositioned in bed.

## 2020-06-16 NOTE — ED Triage Notes (Signed)
Pt arrives via EMS from [facility], C/O CP since  Last night and "arm pain". Pt received 4 81mg  ASA nd 0.4 nitro x1 per EMS. NSR on monitor. 22 L FA. Pt has had 2 MI in the past.

## 2020-06-16 NOTE — H&P (Addendum)
History and Physical   Monique Burns Monique Burns DOB: [birth year] DOA: 06/16/2020  PCP: Adin Hector, MD  Outpatient Specialists: Dr. Clayborn Bigness, [facility] clinic Patient coming from: SNF  I have personally briefly reviewed patient's old medical records in [place].  Chief Concern: chest pressure  HPI: Monique Burns is a 87 y.o. female with medical history significant for frequent falls, cad s/p PCI in December 2016 and CABG x 4 in August 2017, hypertension, depression/anxiety, seizure-like activity, CKD 3B, presented to the emergency department from [facility] facility.  She endorses chest pressure that is not a deep pain.  She states the chest pressure takes her breath away and started yesterday on 06/15/20 in the evening. She was given nitroglycerin and it did not improve her symptoms at the time. She had difficulty sleeping last night. She reports nursing staff or EMS gave her four NG SL and 4 asa prior to presentation on 06/16/20. She reports the chest pressure has improved while in the emergency department.  She reports the chest pressure is persistent. She further endorses associated shortness of breath at rest.   She endorses bilateral upper extremity numbness and throbbing pain that started on evening of 06/15/20 and it persisted for a couple of hours. She reports the chest pressure and bilateral upper extremity pain is similar to her presentation in 2017.   She had covid infection that did not require hospitalization in November 2021.   At bedside patient was awake alert and oriented to self, age, current calendar year, and location of hospital.  She was able to participate fully in HPI however her answers are somewhat slowed.  She reports that this is normal for her now after her history of CVA.  Vaccinations: she is vaccination x2 since 2021.  Social history: lives at facility in [place]. She never used tobacco products, etoh, and recreational drugs. 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   ROS: Constitutional: no weight change, + fever (subjective at facility) ENT/Mouth: no sore throat, no rhinorrhea Eyes: no eye pain, no vision changes Cardiovascular: + chest pain, + dyspnea,  no edema, no palpitations Respiratory: no cough, no sputum, no wheezing Gastrointestinal: + nausea, no vomiting, no diarrhea, + constipation Genitourinary: no urinary incontinence, + dysuria, no hematuria Musculoskeletal: no arthralgias, no myalgias Skin: no skin lesions, no pruritus, Neuro: + weakness, no loss of consciousness, no syncope Psych: no anxiety, no depression, + decrease appetite Heme/Lymph: no bruising, no bleeding  ED Course: Discussed with ED provider, patient requiring hospitalization due to chest pain inpatient with history of coronary artery disease status post PCI and CABG.  Assessment/Plan  Principal Problem:   Chest pain Active Problems:   Chronic kidney disease   HLD (hyperlipidemia)   Degenerative arthritis of hip   Protein malnutrition (HCC)   Overactive bladder   Falls frequently   Debility   Chest pressure in a high risk patient with recent Covid infection in November 2021 -Coronary artery disease status post CABG x4 in 12/28/2015 (lima to LAD and SV to AM, OM2, D1), PCI in 05/03/2015 -[place] clinic, Dr. Clayborn Bigness -On amlodipine 2.5 mg, aspirin 81 mg daily, atorvastatin 80 mg nightly, furosemide 40 mg daily, metoprolol succinate 12.5 mg daily, Ranexa 1000 mg twice daily, nitroglycerin sublingual tablet as needed -N.p.o. at midnight -Complete echo ordered -Cardiologist, Dr. Clayborn Bigness has been consulted via secure chat -Admit to [place] observation with telemetry  Hypertension-elevated -Resumed amlodipine 2.5 mg daily, metoprolol succinate 12.5  mg daily, furosemide 40 mg daily  History of seizure-like activity Dr. Jennings Books -Continue lamotrigine 25 mg nightly  Depression/anxiety-resume duloxetine 30 mg  nightly Chart reviewed.   CABG x4 with LIMA to LAD and SVG to AM, OM2, D1, complicated by postop atrial fibrillation, anemia  Protein/calorie malnutrition-registered dietitian has been consulted for poor p.o. intake, calorie count, supplementation  Left hip pain secondary to fall in January 2022 -Resumed lidocaine patch daily  History of multiple falls-fall precaution  DVT prophylaxis: enoxaparin Code Status: Limited, chest compression, no intubation Diet: heart healthy now, NPO at midnight Family Communication: Updated daughter, Ms. Ermalinda Barrios via phone Disposition Plan: pending cardiology evaluation and clinical course Consults called: cardiology Admission status: observation  Past Medical History:  Diagnosis Date  . Anemia   . Arthritis   . Blood dyscrasia    since being put on Brilinta after stent placement developing hematoma  . Cancer ([place])    basal cell left leg  . CHF (congestive heart failure) ([place])   . GERD (gastroesophageal reflux disease)   . Headache    h/o migraines  . Hyperlipidemia   . Hypertension   . Leukopenia   . MI (myocardial infarction) ([place]) 02/2015   s/p 4 stents and CABG in 2017  . Renal disorder    stage 3-Dr Caryl Comes keeping a check on kidney function  . Shortness of breath dyspnea    mild compared to prior stenting-pt states she would have to stop and rest if walking a mile  . Thyroid disease    Past Surgical History:  Procedure Laterality Date  . ABDOMINAL HYSTERECTOMY    . CARDIAC CATHETERIZATION N/A 12/08/2015   Procedure: Left Heart Cath and Coronary Angiography;  Surgeon: Yolonda Kida, MD;  Location: [place] [facility];  Service: Cardiovascular;  Laterality: N/A;  . CORONARY ANGIOPLASTY WITH STENT PLACEMENT    . CORONARY STENT PLACEMENT  2016   x2  . EYE SURGERY Bilateral 2011  . INCISION AND DRAINAGE Left 08/26/2015   Procedure: INCISION AND DRAINAGE / HAND HEMATOMA;  Surgeon: Hessie Knows, MD;  Location: [facility] ORS;  Service:  Orthopedics;  Laterality: Left;  . JOINT REPLACEMENT Bilateral    left 2016 and right 2015  . TONSILLECTOMY     3rd grade   Social History:  reports that she has never smoked. She has never used smokeless tobacco. She reports that she does not drink alcohol and does not use drugs.  Allergies  Allergen Reactions  . Brilinta [Ticagrelor] Other (See Comments)    "caused stent to fail"  . Imdur [Isosorbide Nitrate] Other (See Comments)    Headache   Family History  Problem Relation Age of Onset  . Breast cancer Paternal Grandmother   . Brain cancer Mother   . Stroke Father   . Brain cancer Son    Family history: Family history reviewed and not pertinent  Prior to Admission medications   Medication Sig Start Date End Date Taking? Authorizing Provider  acetaminophen (TYLENOL) 500 MG tablet Take 500-1,000 mg by mouth every 6 (six) hours as needed for pain.    [provider]  aspirin EC 81 MG tablet Take 81 mg by mouth daily.    [provider]  atorvastatin (LIPITOR) 80 MG tablet Take 80 mg by mouth every evening.     [provider]  cloNIDine (CATAPRES) 0.1 MG tablet Take 0.1 mg by mouth 2 (two) times daily as needed (hypertensive episode). Patient not taking: Reported on 05/29/2020  [provider]  cycloSPORINE (RESTASIS) 0.05 % ophthalmic emulsion Place 1 drop into both eyes 2 (two) times daily.     [provider]  DULoxetine (CYMBALTA) 30 MG capsule Take 30 mg by mouth every evening.    [provider]  ferrous sulfate 325 (65 FE) MG tablet Take 1 tablet (325 mg total) by mouth daily with breakfast. 06/02/20   Nolberto Hanlon, MD  lamoTRIgine (LAMICTAL) 25 MG tablet Take 25 mg by mouth every evening.    [provider]  lidocaine (LIDODERM) 5 % Place 1 patch onto the skin daily. Remove & Discard patch within 12 hours or as directed by MD 06/01/20   Nolberto Hanlon, MD  metoprolol succinate (TOPROL-XL) 25 MG 24 hr tablet Take  25 mg by mouth every evening. 05/03/20   [provider]  nitroGLYCERIN (NITROSTAT) 0.4 MG SL tablet Place 0.4 mg under the tongue every 5 (five) minutes x 3 doses as needed for chest pain.    [provider]  ranolazine (RANEXA) 1000 MG SR tablet Take 1,000 mg by mouth 2 (two) times daily. 11/30/17   [provider]  Vibegron (GEMTESA) 75 MG TABS Take 75 mg by mouth daily. 05/10/20   Debroah Loop, PA-C   Physical Exam: Vitals:   06/16/20 1130 06/16/20 1200 06/16/20 1300 06/16/20 1330  BP: 134/63 (!) 153/60 (!) 174/59 (!) 170/102  Pulse: 65 63 64 66  Resp: 14 17 15 20   Temp:      TempSrc:      SpO2: 98% 98% 99% 99%   Constitutional: appears age-appropriate, NAD, calm, frail Eyes: PERRL, lids and conjunctivae normal ENMT: Mucous membranes are moist. Posterior pharynx clear of any exudate or lesions. Age-appropriate dentition. Hearing appropriate Neck: normal, supple, no masses, no thyromegaly Respiratory: clear to auscultation bilaterally, no wheezing, no crackles. Normal respiratory effort. No accessory muscle use.  Cardiovascular: Regular rate and rhythm, no murmurs / rubs / gallops. No extremity edema. 2+ pedal pulses. No carotid bruits.  Abdomen: no tenderness, no masses palpated, no hepatosplenomegaly. Bowel sounds positive.  Musculoskeletal: no clubbing / cyanosis. No joint deformity upper and lower extremities. Good ROM, no contractures, no atrophy. Normal muscle tone.  Skin: no rashes, lesions, ulcers. No induration Neurologic: Sensation intact. Strength 5/5 in all 4.  Psychiatric: Normal judgment and insight. Alert and oriented x 3. Normal mood.   EKG: independently reviewed, showing sinus rhythm with rate of 66, QTc 446  Chest x-ray on Admission: I personally reviewed and I agree with radiologist reading as below.  DG Chest 2 View  Result Date: 06/16/2020 CLINICAL DATA:  Chest pain EXAM: CHEST - 2 VIEW COMPARISON:  May 28, 2020 FINDINGS:  There has been interval clearing of infiltrate from the left upper lobe. Currently no edema or airspace opacity. The heart size and pulmonary vascularity are within normal limits. Patient is status post coronary artery bypass grafting. There is aortic atherosclerosis. No adenopathy. No bone lesions. IMPRESSION: Lungs now clear. Heart size normal. Postoperative changes noted. Aortic Atherosclerosis (ICD10-I70.0). Electronically Signed   By: Lowella Grip III M.D.   On: 06/16/2020 11:39   CT Angio Chest PE W/Cm &/Or Wo Cm  Result Date: 06/16/2020 CLINICAL DATA:  Chest pain for 2 days EXAM: CT ANGIOGRAPHY CHEST WITH CONTRAST TECHNIQUE: Multidetector CT imaging of the chest was performed using the standard protocol during bolus administration of intravenous contrast. Multiplanar CT image reconstructions and MIPs were obtained to evaluate the vascular anatomy. CONTRAST:  53mL OMNIPAQUE IOHEXOL  350 MG/ML SOLN COMPARISON:  Chest x-ray from earlier in the same day. FINDINGS: Cardiovascular: Thoracic aorta demonstrates atherosclerotic calcifications and changes consistent with prior coronary bypass grafting. No cardiac enlargement is seen. Heavy coronary calcifications are noted. The pulmonary artery shows a normal branching pattern without evidence of intraluminal filling defect to suggest pulmonary embolism. Mediastinum/Nodes: Thoracic inlet is within normal limits. No hilar or mediastinal adenopathy is noted. The esophagus as visualized demonstrates mild wall thickening which may be related to reflux. Lungs/Pleura: Lungs are well aerated bilaterally. Bibasilar atelectatic changes are noted but improved when compared with the prior exam. Scattered calcified granulomas are seen. Upper Abdomen: Visualized upper abdomen is within normal limits. Stable left renal cysts are seen. Musculoskeletal: No chest wall abnormality. No acute or significant osseous findings. Review of the MIP images confirms the above findings.  IMPRESSION: No evidence of pulmonary emboli. Persistent but improved atelectasis in the bases bilaterally. Changes consistent with esophageal reflux. Aortic Atherosclerosis (ICD10-I70.0). Electronically Signed   By: Inez Catalina M.D.   On: 06/16/2020 12:58   Labs on Admission: I have personally reviewed following labs  CBC: Recent Labs  Lab 06/16/20 1041  WBC 4.4  NEUTROABS 2.8  HGB 10.1*  HCT 30.5*  MCV 99.7  PLT 726   Basic Metabolic Panel: Recent Labs  Lab 06/16/20 1041  NA 136  K 4.4  CL 104  CO2 22  GLUCOSE 115*  BUN 26*  CREATININE 1.34*  CALCIUM 9.0  Urine analysis:    Component Value Date/Time   COLORURINE YELLOW (A) 05/28/2020 1137   APPEARANCEUR CLEAR (A) 05/28/2020 1137   APPEARANCEUR Hazy 03/16/2014 1156   LABSPEC 1.016 05/28/2020 1137   LABSPEC 1.018 03/16/2014 1156   PHURINE 5.0 05/28/2020 1137   GLUCOSEU NEGATIVE 05/28/2020 1137   GLUCOSEU Negative 03/16/2014 1156   HGBUR NEGATIVE 05/28/2020 1137   Sun 05/28/2020 1137   BILIRUBINUR Negative 03/16/2014 1156   KETONESUR NEGATIVE 05/28/2020 1137   PROTEINUR 30 (A) 05/28/2020 1137   NITRITE NEGATIVE 05/28/2020 1137   LEUKOCYTESUR NEGATIVE 05/28/2020 1137   LEUKOCYTESUR Trace 03/16/2014 1156   Lynne Takemoto N Dillie Burandt D.O. [facility]  If 7PM-7AM, please contact overnight-coverage provider If 7AM-7PM, please contact day coverage provider [email or web address]  06/16/2020, 3:43 PM

## 2020-06-17 DIAGNOSIS — R079 Chest pain, unspecified: Secondary | ICD-10-CM | POA: Diagnosis not present

## 2020-06-17 LAB — URINALYSIS, COMPLETE (UACMP) WITH MICROSCOPIC
Bacteria, UA: NONE SEEN
Bilirubin Urine: NEGATIVE
Glucose, UA: NEGATIVE mg/dL
Hgb urine dipstick: NEGATIVE
Ketones, ur: NEGATIVE mg/dL
Leukocytes,Ua: NEGATIVE
Nitrite: NEGATIVE
Protein, ur: NEGATIVE mg/dL
Specific Gravity, Urine: 1.025 (ref 1.005–1.030)
pH: 5 (ref 5.0–8.0)

## 2020-06-17 LAB — ECHOCARDIOGRAM COMPLETE
Area-P 1/2: 1.66 cm2
Height: 67 in
S' Lateral: 2.3 cm
Weight: 1840 oz

## 2020-06-17 LAB — MRSA PCR SCREENING: MRSA by PCR: NEGATIVE

## 2020-06-17 MED ORDER — AMLODIPINE BESYLATE 5 MG PO TABS
5.0000 mg | ORAL_TABLET | Freq: Every day | ORAL | Status: DC
Start: 2020-06-18 — End: 2020-06-17

## 2020-06-17 MED ORDER — AMLODIPINE BESYLATE 5 MG PO TABS: 5.0000 mg | ORAL_TABLET | Freq: Every day | ORAL | Status: DC

## 2020-06-17 MED ORDER — NITROGLYCERIN 0.4 MG SL SUBL: 0.4000 mg | SUBLINGUAL_TABLET | Freq: Every day | SUBLINGUAL | Status: DC

## 2020-06-17 NOTE — Discharge Summary (Signed)
Physician Discharge Summary  Monique Burns EYC:[id number] DOB: 02/26/1931 DOA: 06/16/2020  PCP: Adin Hector, MD  Admit date: 06/16/2020 Discharge date: 06/17/2020  Admitted From: SNF Disposition:  SNF  Recommendations for Outpatient Follow-up:  1. Follow up with PCP in 1-2 weeks 2. Follow up with cardiology in 2 weeks  Home Health:No Equipment/Devices:None Discharge Condition:Stable CODE STATUS:Partial Diet recommendation: Heart Healthy  Brief/Interim Summary:  87 y.o. female with medical history significant for frequent falls, cad s/p PCI in December 2016 and CABG x 4 in August 2017, hypertension, depression/anxiety, seizure-like activity, CKD 3B, presented to the emergency department from [facility] facility.  She endorses chest pressure that is not a deep pain.  She states the chest pressure takes her breath away and started yesterday on 06/15/20 in the evening. She was given nitroglycerin and it did not improve her symptoms at the time. She had difficulty sleeping last night. She reports nursing staff or EMS gave her four NG SL and 4 asa prior to presentation on 06/16/20. She reports the chest pressure has improved while in the emergency department.  She reports the chest pressure is persistent. She further endorses associated shortness of breath at rest.   She endorses bilateral upper extremity numbness and throbbing pain that started on evening of 06/15/20 and it persisted for a couple of hours. She reports the chest pressure and bilateral upper extremity pain is similar to her presentation in 2017.  Patient was placed in observation status and seen by medical service.  Troponins were trended and remained flat.  Echocardiogram pursued and demonstrated normal ejection fraction without wall motion abnormalities or valvulopathy.  Patient was seen in consultation by cardiology who recommended aggressive medical management.  No invasive strategy at this time.  Patient remained chest pain-free  without recurrence of anginal symptoms.  At time of discharge will increase home amlodipine to 5 mg daily.  Per cardiology recommendations patient does not tolerate Imdur so will add scheduled nitroglycerin sublingual one tab 0.4 mg daily to her home medication regimen.  Patient be discharged to [facility] in stable condition.   Discharge Diagnoses:  Principal Problem:   Chest pain Active Problems:   Chronic kidney disease   HLD (hyperlipidemia)   Degenerative arthritis of hip   Protein malnutrition (HCC)   Overactive bladder   Falls frequently   Debility  Chest pressure Stable angina Presented with chest pressure.  Unable to exclude anginal symptoms at this time.  Not a great candidate for invasive strategy.  Cardiology consulted.  Recommend medical management.  Continue antianginal therapy as per home medication regimen.  Added scheduled sublingual nitroglycerin one tab daily.  Discharged back to [facility] with follow-up with PCP and cardiology.    Discharge Instructions  Discharge Instructions    Diet - low sodium heart healthy   Complete by: As directed    Increase activity slowly   Complete by: As directed      Allergies as of 06/17/2020      Reactions   Brilinta [ticagrelor] Other (See Comments)   "caused stent to fail"   Imdur [isosorbide Nitrate] Other (See Comments)   Headache      Medication List    TAKE these medications   acetaminophen 500 MG tablet Commonly known as: TYLENOL Take 500-1,000 mg by mouth every 6 (six) hours as needed for pain.   amLODipine 5 MG tablet Commonly known as: NORVASC Take 1 tablet (5 mg total) by mouth daily. Start taking on: June 18, 2020  aspirin EC 81 MG tablet Take 81 mg by mouth daily.   atorvastatin 80 MG tablet Commonly known as: LIPITOR Take 80 mg by mouth every evening.   cycloSPORINE 0.05 % ophthalmic emulsion Commonly known as: RESTASIS Place 1 drop into both eyes 2 (two) times daily.    DULoxetine 30 MG capsule Commonly known as: CYMBALTA Take 30 mg by mouth every evening.   ferrous sulfate 325 (65 FE) MG tablet Take 1 tablet (325 mg total) by mouth daily with breakfast.   Gemtesa 75 MG Tabs Generic drug: Vibegron Take 75 mg by mouth daily.   lamoTRIgine 25 MG tablet Commonly known as: LAMICTAL Take 25 mg by mouth every evening.   lidocaine 5 % Commonly known as: LIDODERM Place 1 patch onto the skin daily. Remove & Discard patch within 12 hours or as directed by MD   metoprolol succinate 25 MG 24 hr tablet Commonly known as: TOPROL-XL Take 25 mg by mouth every evening.   MULTIVITAMIN ADULT PO Take 1 tablet by mouth daily.   nitroGLYCERIN 0.4 MG SL tablet Commonly known as: NITROSTAT Place 0.4 mg under the tongue every 5 (five) minutes x 3 doses as needed for chest pain. What changed: Another medication with the same name was added. Make sure you understand how and when to take each.   nitroGLYCERIN 0.4 MG SL tablet Commonly known as: Nitrostat Place 1 tablet (0.4 mg total) under the tongue daily. What changed: You were already taking a medication with the same name, and this prescription was added. Make sure you understand how and when to take each.   ranolazine 1000 MG SR tablet Commonly known as: RANEXA Take 1,000 mg by mouth 2 (two) times daily.       Follow-up Information    Monique High III, MD. Schedule an appointment as soon as possible for a visit in 1 week(s).   Specialty: Internal Medicine Contact information: [address] [place] [address] [phone]        Monique Amel D, MD. Schedule an appointment as soon as possible for a visit in 2 week(s).   Specialties: Cardiology, Internal Medicine Contact information: [address] [place] [address] [phone]              Allergies  Allergen Reactions  . Brilinta [Ticagrelor] Other (See Comments)    "caused stent to fail"   . Imdur [Isosorbide Nitrate] Other (See Comments)    Headache    Consultations:  Cardiology-[facility]   Procedures/Studies: DG Chest 2 View  Result Date: 06/16/2020 CLINICAL DATA:  Chest pain EXAM: CHEST - 2 VIEW COMPARISON:  May 28, 2020 FINDINGS: There has been interval clearing of infiltrate from the left upper lobe. Currently no edema or airspace opacity. The heart size and pulmonary vascularity are within normal limits. Patient is status post coronary artery bypass grafting. There is aortic atherosclerosis. No adenopathy. No bone lesions. IMPRESSION: Lungs now clear. Heart size normal. Postoperative changes noted. Aortic Atherosclerosis (ICD10-I70.0). Electronically Signed   By: Lowella Grip Burns M.Burns.   On: 06/16/2020 11:39   DG Chest 2 View  Result Date: 05/28/2020 CLINICAL DATA:  Shortness of breath EXAM: CHEST - 2 VIEW COMPARISON:  February 09, 2020 FINDINGS: There is airspace opacity throughout much of the left upper lobe without significant consolidation. Lungs elsewhere clear. Heart size and pulmonary vascularity are normal. Patient is status post coronary artery bypass grafting. No adenopathy. There is aortic atherosclerosis. No bone lesions.  IMPRESSION: Airspace opacity consistent with pneumonia left upper lobe. Question atypical organism pneumonia given this appearance. Advise check of COVID-19 status. Lungs elsewhere clear. Status post coronary artery bypass grafting. Heart size normal. No adenopathy. Aortic Atherosclerosis (ICD10-I70.0). Electronically Signed   By: Lowella Grip Burns M.Burns.   On: 05/28/2020 10:34   CT Head Wo Contrast  Result Date: 05/28/2020 CLINICAL DATA:  Pain following fall EXAM: CT HEAD WITHOUT CONTRAST TECHNIQUE: Contiguous axial images were obtained from the base of the skull through the vertex without intravenous contrast. COMPARISON:  December 31, 2019 FINDINGS: Brain: There is stable age related volume loss. There is no intracranial mass,  hemorrhage, extra-axial fluid collection, or midline shift. There is patchy small vessel disease in the centra semiovale bilaterally, stable. No acute infarct is evident. Vascular: No hyperdense vessel. Foci of calcification noted in the distal left vertebral artery and in each carotid siphon region. Skull: The bony calvarium appears intact. Sinuses/Orbits: There is mucosal thickening in multiple ethmoid air cells. Orbits appear symmetric bilaterally. Other: Visualized mastoid air cells are clear. IMPRESSION: Age related volume loss with stable patchy periventricular small vessel disease. No acute infarct. No mass or hemorrhage. There are foci of arterial vascular calcification. There is mucosal thickening in several ethmoid air cells. Electronically Signed   By: Lowella Grip Burns M.Burns.   On: 05/28/2020 10:36   CT Angio Chest PE W/Cm &/Or Wo Cm  Result Date: 06/16/2020 CLINICAL DATA:  Chest pain for 2 days EXAM: CT ANGIOGRAPHY CHEST WITH CONTRAST TECHNIQUE: Multidetector CT imaging of the chest was performed using the standard protocol during bolus administration of intravenous contrast. Multiplanar CT image reconstructions and MIPs were obtained to evaluate the vascular anatomy. CONTRAST:  23mL OMNIPAQUE IOHEXOL 350 MG/ML SOLN COMPARISON:  Chest x-ray from earlier in the same day. FINDINGS: Cardiovascular: Thoracic aorta demonstrates atherosclerotic calcifications and changes consistent with prior coronary bypass grafting. No cardiac enlargement is seen. Heavy coronary calcifications are noted. The pulmonary artery shows a normal branching pattern without evidence of intraluminal filling defect to suggest pulmonary embolism. Mediastinum/Nodes: Thoracic inlet is within normal limits. No hilar or mediastinal adenopathy is noted. The esophagus as visualized demonstrates mild wall thickening which may be related to reflux. Lungs/Pleura: Lungs are well aerated bilaterally. Bibasilar atelectatic changes are noted  but improved when compared with the prior exam. Scattered calcified granulomas are seen. Upper Abdomen: Visualized upper abdomen is within normal limits. Stable left renal cysts are seen. Musculoskeletal: No chest wall abnormality. No acute or significant osseous findings. Review of the MIP images confirms the above findings. IMPRESSION: No evidence of pulmonary emboli. Persistent but improved atelectasis in the bases bilaterally. Changes consistent with esophageal reflux. Aortic Atherosclerosis (ICD10-I70.0). Electronically Signed   By: Inez Catalina M.Burns.   On: 06/16/2020 12:58   MR BRAIN WO CONTRAST  Result Date: 05/29/2020 CLINICAL DATA:  Neuro deficit, acute, stroke suspected EXAM: MRI HEAD WITHOUT CONTRAST TECHNIQUE: Multiplanar, multiecho pulse sequences of the brain and surrounding structures were obtained without intravenous contrast. COMPARISON:  05/28/2020 and prior. FINDINGS: Please note that image quality is degraded by motion artifact. Brain: No diffusion-weighted signal abnormality. Left cerebral microhemorrhage. No midline shift, ventriculomegaly or extra-axial fluid collection. No mass lesion. Mild cerebral atrophy with ex vacuo dilatation. Moderate chronic microvascular ischemic changes. Sequela of remote right temporal insult. Vascular: Major intracranial flow voids are grossly preserved proximally. Skull and upper cervical spine: No acute finding. Sinuses/Orbits: No acute finding. Other: Incomplete FLAIR suppression. IMPRESSION: No acute intracranial process.  Remote right temporal insult. Mild cerebral atrophy and moderate chronic microvascular ischemic changes. Motion degraded exam. Electronically Signed   By: Primitivo Gauze M.Burns.   On: 05/29/2020 14:11   CT ABDOMEN PELVIS W CONTRAST  Result Date: 05/28/2020 CLINICAL DATA:  Recent fall with abdominal pain, initial encounter EXAM: CT ABDOMEN AND PELVIS WITH CONTRAST TECHNIQUE: Multidetector CT imaging of the abdomen and pelvis was  performed using the standard protocol following bolus administration of intravenous contrast. CONTRAST:  67mL OMNIPAQUE IOHEXOL 300 MG/ML  SOLN COMPARISON:  Chest x-ray from earlier in the same day. FINDINGS: Lower chest: Small bilateral pleural effusions are noted. Bibasilar atelectatic changes are again seen similar to that noted on the prior plain film of the chest. Loculated fluid is noted adjacent to the distal descending thoracic aorta consistent with small loculated effusion. Hepatobiliary: Gallbladder is well distended although no cholelithiasis is seen. The liver is within normal limits. Pancreas: Unremarkable. No pancreatic ductal dilatation or surrounding inflammatory changes. Spleen: Normal in size without focal abnormality. Adrenals/Urinary Tract: Adrenal glands are within normal limits. Right kidney demonstrates no mass lesion or hydronephrosis. Small lower pole cyst is noted. Left kidney demonstrates exophytic cyst from the upper pole measuring 5 cm. Smaller cysts are noted. No renal calculi or obstructive changes are noted. The bladder is well distended. Stomach/Bowel: Diverticular change of the colon is noted without evidence of definitive diverticulitis. Considerable retained fecal material is noted throughout the colon consistent with at least a mild degree of constipation. The appendix is not well visualized and may have been surgically removed. No inflammatory changes are seen. The stomach is within normal limits. No small bowel abnormality is seen. Vascular/Lymphatic: Aortic atherosclerosis. No enlarged abdominal or pelvic lymph nodes. Reproductive: Uterus has been surgically removed. Cystic changes are noted in the right ovary. These appear simple in nature. The largest of these measures 3.1 Cm in dimension. Other: No abdominal wall hernia or abnormality. No abdominopelvic ascites. Musculoskeletal: Bilateral hip replacements are seen. No compression deformities are noted. Mild degenerative  changes of lumbar spine are noted. IMPRESSION: Small bilateral pleural effusions with bibasilar atelectatic changes similar to that seen on prior chest x-ray. Bilateral renal cysts simple in nature. Diverticulosis without diverticulitis. Cystic changes in the right ovary. The uterus has been surgically removed. Recommend follow-up US in 6-12 months. Note: This recommendation does not apply to premenarchal patients and to those with increased risk (genetic, family history, elevated tumor markers or other high-risk factors) of ovarian cancer. Reference: JACR 2020 Feb; 17(2):248-254 Electronically Signed   By: Inez Catalina M.Burns.   On: 05/28/2020 15:19   US Venous Img Lower Bilateral (DVT)  Result Date: 05/29/2020 CLINICAL DATA:  Elevated Burns-dimer, lower extremity pain and edema EXAM: BILATERAL LOWER EXTREMITY VENOUS DOPPLER ULTRASOUND TECHNIQUE: Gray-scale sonography with graded compression, as well as color Doppler and duplex ultrasound were performed to evaluate the lower extremity deep venous systems from the level of the common femoral vein and including the common femoral, femoral, profunda femoral, popliteal and calf veins including the posterior tibial, peroneal and gastrocnemius veins when visible. The superficial great saphenous vein was also interrogated. Spectral Doppler was utilized to evaluate flow at rest and with distal augmentation maneuvers in the common femoral, femoral and popliteal veins. COMPARISON:  None. FINDINGS: RIGHT LOWER EXTREMITY Common Femoral Vein: No evidence of thrombus. Normal compressibility, respiratory phasicity and response to augmentation. Saphenofemoral Junction: No evidence of thrombus. Normal compressibility and flow on color Doppler imaging. Profunda Femoral Vein: No evidence of  thrombus. Normal compressibility and flow on color Doppler imaging. Femoral Vein: No evidence of thrombus. Normal compressibility, respiratory phasicity and response to augmentation. Popliteal Vein:  No evidence of thrombus. Normal compressibility, respiratory phasicity and response to augmentation. Calf Veins: No evidence of thrombus. Normal compressibility and flow on color Doppler imaging. LEFT LOWER EXTREMITY Common Femoral Vein: No evidence of thrombus. Normal compressibility, respiratory phasicity and response to augmentation. Saphenofemoral Junction: No evidence of thrombus. Normal compressibility and flow on color Doppler imaging. Profunda Femoral Vein: No evidence of thrombus. Normal compressibility and flow on color Doppler imaging. Femoral Vein: No evidence of thrombus. Normal compressibility, respiratory phasicity and response to augmentation. Popliteal Vein: No evidence of thrombus. Normal compressibility, respiratory phasicity and response to augmentation. Calf Veins: No evidence of thrombus. Normal compressibility and flow on color Doppler imaging. IMPRESSION: No evidence of deep venous thrombosis in either lower extremity. Electronically Signed   By: Jerilynn Mages.  Shick M.Burns.   On: 05/29/2020 09:55   ECHOCARDIOGRAM COMPLETE  Result Date: 06/17/2020    ECHOCARDIOGRAM REPORT   Patient Name:   Monique Burns Date of Exam: 06/16/2020 Medical Rec #:  [id number]       Height:       67.0 in Accession #:    [id number]      Weight:       115.0 lb Date of Birth:  09/18/1930       BSA:          1.598 m Patient Age:    11 years        BP:           150/56 mmHg Patient Gender: F               HR:           60 bpm. Exam Location:  [facility] Procedure: 2D Echo, Cardiac Doppler and Color Doppler Indications:     R07.9 Chest pain  History:         Patient has prior history of Echocardiogram examinations, most                  recent 12/20/2017. Thyroid disease. Shortness of breath.                  Myocardial infarction. Congestive heart failure.  Sonographer:     Wilford Sports Rodgers-Jones Referring Phys:  [id number] AMY N COX Diagnosing Phys: Yolonda Kida MD IMPRESSIONS  1. Left ventricular ejection fraction, by estimation, is  60 to 65%. The left ventricle has normal function. The left ventricle has no regional wall motion abnormalities. Left ventricular diastolic parameters were normal.  2. Right ventricular systolic function is normal. The right ventricular size is normal.  3. The mitral valve is abnormal. Trivial mitral valve regurgitation.  4. The aortic valve is calcified. There is mild calcification of the aortic valve. There is mild thickening of the aortic valve. Aortic valve regurgitation is not visualized. Mild aortic valve sclerosis is present, with no evidence of aortic valve stenosis. FINDINGS  Left Ventricle: Left ventricular ejection fraction, by estimation, is 60 to 65%. The left ventricle has normal function. The left ventricle has no regional wall motion abnormalities. The left ventricular internal cavity size was normal in size. There is  borderline concentric left ventricular hypertrophy. Left ventricular diastolic parameters were normal. Right Ventricle: The right ventricular size is normal. No increase in right ventricular wall thickness. Right ventricular systolic function is normal. Left Atrium: Left atrial size was normal  in size. Right Atrium: Right atrial size was normal in size. Pericardium: There is no evidence of pericardial effusion. Mitral Valve: The mitral valve is abnormal. Trivial mitral valve regurgitation. Tricuspid Valve: The tricuspid valve is normal in structure. Tricuspid valve regurgitation is trivial. Aortic Valve: The aortic valve is calcified. There is mild calcification of the aortic valve. There is mild thickening of the aortic valve. There is mild aortic valve annular calcification. Aortic valve regurgitation is not visualized. Mild aortic valve sclerosis is present, with no evidence of aortic valve stenosis. Pulmonic Valve: The pulmonic valve was normal in structure. Pulmonic valve regurgitation is not visualized. Aorta: The ascending aorta was not well visualized. IAS/Shunts: No atrial  level shunt detected by color flow Doppler.  LEFT VENTRICLE PLAX 2D LVIDd:         3.99 cm  Diastology LVIDs:         2.30 cm  LV e' medial:    6.31 cm/s LV PW:         0.88 cm  LV E/e' medial:  17.1 LV IVS:        0.93 cm  LV e' lateral:   7.07 cm/s LVOT diam:     1.80 cm  LV E/e' lateral: 15.3 LV SV:         56 LV SV Index:   35 LVOT Area:     2.54 cm  RIGHT VENTRICLE             IVC RV Basal diam:  4.31 cm     IVC diam: 1.76 cm RV S prime:     12.00 cm/s TAPSE (M-mode): 2.4 cm LEFT ATRIUM             Index       RIGHT ATRIUM           Index LA diam:        4.30 cm 2.69 cm/m  RA Area:     12.30 cm LA Vol (A2C):   41.4 ml 25.90 ml/m RA Volume:   30.90 ml  19.33 ml/m LA Vol (A4C):   37.8 ml 23.65 ml/m LA Biplane Vol: 39.6 ml 24.77 ml/m  AORTIC VALVE LVOT Vmax:   81.30 cm/s LVOT Vmean:  64.500 cm/s LVOT VTI:    0.219 m  AORTA Ao Root diam: 2.80 cm MITRAL VALVE                TRICUSPID VALVE MV Area (PHT): 1.66 cm     TR Peak grad:   25.4 mmHg MV Decel Time: 458 msec     TR Vmax:        252.00 cm/s MV E velocity: 108.00 cm/s MV A velocity: 98.00 cm/s   SHUNTS MV E/A ratio:  1.10         Systemic VTI:  0.22 m                             Systemic Diam: 1.80 cm Yolonda Kida MD Electronically signed by Yolonda Kida MD Signature Date/Time: 06/17/2020/7:09:32 AM    Final    DG Hip Unilat W or Wo Pelvis 2-3 Views Left  Result Date: 05/28/2020 CLINICAL DATA:  Left hip pain after fall. EXAM: DG HIP (WITH OR WITHOUT PELVIS) 2-3V LEFT COMPARISON:  None. FINDINGS: Status post bilateral total hip arthroplasties. No fracture or dislocation is noted. Vascular calcifications are noted. IMPRESSION: No acute abnormality seen in the left  hip. Electronically Signed   By: Marijo Conception M.Burns.   On: 05/28/2020 13:56    (Echo, Carotid, EGD, Colonoscopy, ERCP)    Subjective: Seen and examined the day of discharge.  Stable, no distress.  No recurrence of anginal symptoms.  Stable for discharge back to [facility].  Discharge Exam: Vitals:   06/17/20 0733 06/17/20 1128  BP: (!) 149/60 (!) 136/50  Pulse: 61 (!) 59  Resp: 20 16  Temp: 98.1 F (36.7 C) 98 F (36.7 C)  SpO2: 98% 97%   Vitals:   06/17/20 0039 06/17/20 0416 06/17/20 0733 06/17/20 1128  BP: (!) 139/54 (!) 139/49 (!) 149/60 (!) 136/50  Pulse: 61 (!) 59 61 (!) 59  Resp: 18 18 20 16   Temp: 97.7 F (36.5 C) 98.3 F (36.8 C) 98.1 F (36.7 C) 98 F (36.7 C)  TempSrc: Tympanic Oral Oral Oral  SpO2: 94% 98% 98% 97%  Weight:      Height:        General: Pt is alert, awake, not in acute distress Cardiovascular: RRR, S1/S2 +, no rubs, no gallops Respiratory: CTA bilaterally, no wheezing, no rhonchi Abdominal: Soft, NT, ND, bowel sounds + Extremities: no edema, no cyanosis    The results of significant diagnostics from this hospitalization (including imaging, microbiology, ancillary and laboratory) are listed below for reference.     Microbiology: Recent Results (from the past 240 hour(s))  Resp Panel by RT-PCR (Flu A&B, Covid) Nasopharyngeal Swab     Status: None   Collection Time: 06/16/20 10:42 AM   Specimen: Nasopharyngeal Swab; Nasopharyngeal(NP) swabs in vial transport medium  Result Value Ref Range Status   SARS Coronavirus 2 by RT PCR NEGATIVE NEGATIVE Final    Comment: (NOTE) SARS-CoV-2 target nucleic acids are NOT DETECTED.  The SARS-CoV-2 RNA is generally detectable in upper respiratory specimens during the acute phase of infection. The lowest concentration of SARS-CoV-2 viral copies this assay can detect is 138 copies/mL. A negative result does not preclude SARS-Cov-2 infection and should not be used as the sole basis for treatment or other patient management decisions. A negative result may occur with  improper specimen collection/handling, submission of specimen other than nasopharyngeal swab, presence of viral mutation(s) within the areas targeted by this assay, and inadequate number of  viral copies(<138 copies/mL). A negative result must be combined with clinical observations, patient history, and epidemiological information. The expected result is Negative.  Fact Sheet for Patients:  EntrepreneurPulse.com.au  Fact Sheet for Healthcare Providers:  IncredibleEmployment.be  This test is no t yet approved or cleared by the [place] FDA and  has been authorized for detection and/or diagnosis of SARS-CoV-2 by FDA under an Emergency Use Authorization (EUA). This EUA will remain  in effect (meaning this test can be used) for the duration of the COVID-19 declaration under Section 564(b)(1) of the Act, 21 U.S.C.section 360bbb-3(b)(1), unless the authorization is terminated  or revoked sooner.       Influenza A by PCR NEGATIVE NEGATIVE Final   Influenza B by PCR NEGATIVE NEGATIVE Final    Comment: (NOTE) The Xpert Xpress SARS-CoV-2/FLU/RSV plus assay is intended as an aid in the diagnosis of influenza from Nasopharyngeal swab specimens and should not be used as a sole basis for treatment. Nasal washings and aspirates are unacceptable for Xpert Xpress SARS-CoV-2/FLU/RSV testing.  Fact Sheet for Patients: EntrepreneurPulse.com.au  Fact Sheet for Healthcare Providers: IncredibleEmployment.be  This test is not yet approved or cleared by the [place]  FDA and has been authorized for detection and/or diagnosis of SARS-CoV-2 by FDA under an Emergency Use Authorization (EUA). This EUA will remain in effect (meaning this test can be used) for the duration of the COVID-19 declaration under Section 564(b)(1) of the Act, 21 U.S.C. section 360bbb-3(b)(1), unless the authorization is terminated or revoked.  Performed at [facility], [address] [place], [place] [address]   MRSA PCR Screening     Status: None   Collection Time: 06/17/20  4:27 AM   Specimen: Nasal Mucosa;  Nasopharyngeal  Result Value Ref Range Status   MRSA by PCR NEGATIVE NEGATIVE Final    Comment:        The GeneXpert MRSA Assay (FDA approved for NASAL specimens only), is one component of a comprehensive MRSA colonization surveillance program. It is not intended to diagnose MRSA infection nor to guide or monitor treatment for MRSA infections. Performed at [facility], [address] [place], [place] [address]      Labs: BNP (last 3 results) No results for input(s): BNP in the last 8760 hours. Basic Metabolic Panel: Recent Labs  Lab 06/16/20 1041  NA 136  K 4.4  CL 104  CO2 22  GLUCOSE 115*  BUN 26*  CREATININE 1.34*  CALCIUM 9.0   Liver Function Tests: No results for input(s): AST, ALT, ALKPHOS, BILITOT, PROT, ALBUMIN in the last 168 hours. No results for input(s): LIPASE, AMYLASE in the last 168 hours. No results for input(s): AMMONIA in the last 168 hours. CBC: Recent Labs  Lab 06/16/20 1041  WBC 4.4  NEUTROABS 2.8  HGB 10.1*  HCT 30.5*  MCV 99.7  PLT 298   Cardiac Enzymes: No results for input(s): CKTOTAL, CKMB, CKMBINDEX, TROPONINI in the last 168 hours. BNP: Invalid input(s): POCBNP CBG: No results for input(s): GLUCAP in the last 168 hours. Burns-Dimer No results for input(s): DDIMER in the last 72 hours. Hgb A1c No results for input(s): HGBA1C in the last 72 hours. Lipid Profile No results for input(s): CHOL, HDL, LDLCALC, TRIG, CHOLHDL, LDLDIRECT in the last 72 hours. Thyroid function studies No results for input(s): TSH, T4TOTAL, T3FREE, THYROIDAB in the last 72 hours.  Invalid input(s): FREET3 Anemia work up No results for input(s): VITAMINB12, FOLATE, FERRITIN, TIBC, IRON, RETICCTPCT in the last 72 hours. Urinalysis    Component Value Date/Time   COLORURINE YELLOW (A) 06/17/2020 0516   APPEARANCEUR CLEAR (A) 06/17/2020 0516   APPEARANCEUR Hazy 03/16/2014 1156   LABSPEC 1.025 06/17/2020 0516   LABSPEC 1.018 03/16/2014 1156    PHURINE 5.0 06/17/2020 0516   GLUCOSEU NEGATIVE 06/17/2020 0516   GLUCOSEU Negative 03/16/2014 1156   HGBUR NEGATIVE 06/17/2020 0516   BILIRUBINUR NEGATIVE 06/17/2020 0516   BILIRUBINUR Negative 03/16/2014 1156   KETONESUR NEGATIVE 06/17/2020 0516   PROTEINUR NEGATIVE 06/17/2020 0516   NITRITE NEGATIVE 06/17/2020 0516   LEUKOCYTESUR NEGATIVE 06/17/2020 0516   LEUKOCYTESUR Trace 03/16/2014 1156   Sepsis Labs Invalid input(s): PROCALCITONIN,  WBC,  LACTICIDVEN Microbiology Recent Results (from the past 240 hour(s))  Resp Panel by RT-PCR (Flu A&B, Covid) Nasopharyngeal Swab     Status: None   Collection Time: 06/16/20 10:42 AM   Specimen: Nasopharyngeal Swab; Nasopharyngeal(NP) swabs in vial transport medium  Result Value Ref Range Status   SARS Coronavirus 2 by RT PCR NEGATIVE NEGATIVE Final    Comment: (NOTE) SARS-CoV-2 target nucleic acids are NOT DETECTED.  The SARS-CoV-2 RNA is generally detectable in upper respiratory specimens during the acute phase  of infection. The lowest concentration of SARS-CoV-2 viral copies this assay can detect is 138 copies/mL. A negative result does not preclude SARS-Cov-2 infection and should not be used as the sole basis for treatment or other patient management decisions. A negative result may occur with  improper specimen collection/handling, submission of specimen other than nasopharyngeal swab, presence of viral mutation(s) within the areas targeted by this assay, and inadequate number of viral copies(<138 copies/mL). A negative result must be combined with clinical observations, patient history, and epidemiological information. The expected result is Negative.  Fact Sheet for Patients:  EntrepreneurPulse.com.au  Fact Sheet for Healthcare Providers:  IncredibleEmployment.be  This test is no t yet approved or cleared by the [place] FDA and  has been authorized for detection and/or diagnosis of  SARS-CoV-2 by FDA under an Emergency Use Authorization (EUA). This EUA will remain  in effect (meaning this test can be used) for the duration of the COVID-19 declaration under Section 564(b)(1) of the Act, 21 U.S.C.section 360bbb-3(b)(1), unless the authorization is terminated  or revoked sooner.       Influenza A by PCR NEGATIVE NEGATIVE Final   Influenza B by PCR NEGATIVE NEGATIVE Final    Comment: (NOTE) The Xpert Xpress SARS-CoV-2/FLU/RSV plus assay is intended as an aid in the diagnosis of influenza from Nasopharyngeal swab specimens and should not be used as a sole basis for treatment. Nasal washings and aspirates are unacceptable for Xpert Xpress SARS-CoV-2/FLU/RSV testing.  Fact Sheet for Patients: EntrepreneurPulse.com.au  Fact Sheet for Healthcare Providers: IncredibleEmployment.be  This test is not yet approved or cleared by the [place] FDA and has been authorized for detection and/or diagnosis of SARS-CoV-2 by FDA under an Emergency Use Authorization (EUA). This EUA will remain in effect (meaning this test can be used) for the duration of the COVID-19 declaration under Section 564(b)(1) of the Act, 21 U.S.C. section 360bbb-3(b)(1), unless the authorization is terminated or revoked.  Performed at [facility], [address] [place], [place] [address]   MRSA PCR Screening     Status: None   Collection Time: 06/17/20  4:27 AM   Specimen: Nasal Mucosa; Nasopharyngeal  Result Value Ref Range Status   MRSA by PCR NEGATIVE NEGATIVE Final    Comment:        The GeneXpert MRSA Assay (FDA approved for NASAL specimens only), is one component of a comprehensive MRSA colonization surveillance program. It is not intended to diagnose MRSA infection nor to guide or monitor treatment for MRSA infections. Performed at [facility], [address], [place], [place] [address]      Time coordinating  discharge: Over 30 minutes  SIGNED:   Sidney Ace, MD  [facility] 06/17/2020, 3:04 PM Pager   If 7PM-7AM, please contact night-coverage

## 2020-06-17 NOTE — Evaluation (Signed)
Occupational Therapy Evaluation Patient Details Name: Monique Burns MRN: [id number] DOB: 05-30-1930 Today's Date: 06/17/2020    History of Present Illness Pt is an 87yo F that presented to ED from [facility] for chest pain. Pt recently seen at [facility] for falls,at the time brain MRI revealed remote R temporal insult, and L hip XR negative for fx. Significant PMH includes: CAD s/p MI s/p CABG, HLD, HTN, CKD (III), OAB, and sz.   Clinical Impression   Ms Korf was seen for OT evaluation this date and is known to OT from recent admission. Pt from [facility], participating in PT/OT. Per conversation with Daughter over phone, prior to [facility] pt lived alone with daughter/grandson providing assistance as able. Pt presents to acute OT demonstrating impaired ADL performance and functional mobility 2/2 decreased safety awareness, functional strength deficits, and decreased activity tolerance. Pt is pleasantly confused, A&Ox2 - self and place only. Pt does not recognize OT after leaving to call daughter and returning ~5 mins later stating "that PT lady came in to work with me and didn't do very much" "no it wasn't you, I don't think so."  Pt currently requires MIN A for simulated toilet t/f - assist for initial lift off/RW mgmt. CGA + RW simulated standing grooming - reaches outside BOS c single UE support, no LOBs. SUPERVISION don/doff B socks seated EOC. Pt would benefit from skilled OT to address noted impairments and functional limitations (see below for any additional details) in order to maximize safety and independence while minimizing falls risk and caregiver burden. Upon hospital discharge, recommend [place] c 24/7 SUPERVISION to maximize pt safety and return to functional independence during meaningful occupations of daily life. If family is unable to provide 24/7 Supervision, recommend returning to STR to maximize pt safety.     Follow Up Recommendations  Home health OT;Supervision/Assistance - 24 hour (if  family unable to provide 24/7, recommend SNF)    Equipment Recommendations  3 in 1 bedside commode    Recommendations for Other Services       Precautions / Restrictions Precautions Precautions: Fall Restrictions Weight Bearing Restrictions: No      Mobility Bed Mobility Overal bed mobility: Needs Assistance Bed Mobility: Supine to Sit     Supine to sit: Supervision     General bed mobility comments: MIN VCs for sequencing/initiation    Transfers Overall transfer level: Needs assistance Equipment used: Rolling walker (2 wheeled) Transfers: Sit to/from Stand Sit to Stand: Min assist         General transfer comment: MIN A + RW sit<>stand    Balance Overall balance assessment: Needs assistance Sitting-balance support: No upper extremity supported;Feet supported Sitting balance-Leahy Scale: Good     Standing balance support: During functional activity;Single extremity supported Standing balance-Leahy Scale: Fair                             ADL either performed or assessed with clinical judgement   ADL Overall ADL's : Needs assistance/impaired                                       General ADL Comments: MIN A for simulated toilet t/f - assist for initial lift off/RW mgmt. CGA + RW simulated standing grooming - reaches outside BOS c single UE support, no LOBs. SUPERVISION don/doff B socks seated EOC  Pertinent Vitals/Pain Pain Assessment: No/denies pain     Hand Dominance Right   Extremity/Trunk Assessment Upper Extremity Assessment Upper Extremity Assessment: Generalized weakness   Lower Extremity Assessment Lower Extremity Assessment: Generalized weakness       Communication Communication Communication: No difficulties   Cognition Arousal/Alertness: Awake/alert Behavior During Therapy: WFL for tasks assessed/performed Overall Cognitive Status: History of cognitive impairments - at baseline                                  General Comments: A&O x2 self/place only - reports she is in the hopsital bc of her hip and that Dr Rudene Christians is having a look at it   General Comments       Exercises Exercises: Other exercises Other Exercises Other Exercises: Pt educated re: OT role, DME recs, d/c recs, falls prevention, ECS Other Exercises: LBD, self-feeding, sup>sit, sit<>stand, sitting/standing balance/tolerance, ~15 ft mobility, functional reach   Shoulder Instructions      Home Living Family/patient expects to be discharged to:: Private residence Living Arrangements: Alone Available Help at Discharge: Family;Available PRN/intermittently Type of Home: House             Bathroom Shower/Tub: Teacher, early years/pre: Standard Bathroom Accessibility: Yes   Home Equipment: Environmental consultant - 4 wheels;Cane - single point;Shower seat;Toilet riser   Additional Comments: Per phone call to daughter: daughter and grandson have been staying with pt as able last ~6 months since her spouse passed away.      Prior Functioning/Environment Level of Independence: Needs assistance  Gait / Transfers Assistance Needed: Pt from SNF since recent hip fx, working with PT ADL's / Homemaking Assistance Needed: Prior to recent hip fx pt required PRN assist for bathing/dressing            OT Problem List: Decreased strength;Decreased activity tolerance;Decreased safety awareness      OT Treatment/Interventions: Self-care/ADL training;Therapeutic exercise;Energy conservation;Therapeutic activities;DME and/or AE instruction;Patient/family education;Balance training    OT Goals(Current goals can be found in the care plan section) Acute Rehab OT Goals Patient Stated Goal: To get better OT Goal Formulation: With patient Time For Goal Achievement: 07/01/20 Potential to Achieve Goals: Good ADL Goals Pt Will Perform Grooming: with modified independence;standing (c LRAD PRN) Pt Will  Perform Lower Body Dressing: with modified independence;sit to/from stand (c LRAD PRN) Pt Will Transfer to Toilet: with modified independence;ambulating;regular height toilet (c LRAD PRN)  OT Frequency: Min 1X/week    AM-PAC OT "6 Clicks" Daily Activity     Outcome Measure Help from another person eating meals?: None Help from another person taking care of personal grooming?: A Little Help from another person toileting, which includes using toliet, bedpan, or urinal?: A Little Help from another person bathing (including washing, rinsing, drying)?: A Little Help from another person to put on and taking off regular upper body clothing?: A Little Help from another person to put on and taking off regular lower body clothing?: A Little 6 Click Score: 19   End of Session Equipment Utilized During Treatment: Rolling walker Nurse Communication: Mobility status  Activity Tolerance: Patient tolerated treatment well Patient left: in chair;with call bell/phone within reach;with chair alarm set  OT Visit Diagnosis: Other abnormalities of gait and mobility (R26.89);History of falling (Z91.81)                Time: 4782-9562 OT Time Calculation (min): 26 min Charges:  OT General  Charges $OT Visit: 1 Visit OT Evaluation $OT Eval Low Complexity: 1 Low OT Treatments $Self Care/Home Management : 23-37 mins  Dessie Coma, M.S. OTR/L  06/17/20, 2:07 PM  ascom [phone]

## 2020-06-17 NOTE — Care Management Obs Status (Signed)
MEDICARE OBSERVATION STATUS NOTIFICATION   Patient Details  Name: Monique Burns MRN: [id number] Date of Birth: 1930-12-28   Medicare Observation Status Notification Given:  Yes    Candie Chroman, LCSW 06/17/2020, 3:26 PM

## 2020-06-17 NOTE — Care Plan (Signed)
Report called to [place] at [facility] at this time.

## 2020-06-17 NOTE — Evaluation (Addendum)
Physical Therapy Evaluation Patient Details Name: Monique Burns MRN: [id number] DOB: [birth year] Today's Date: 06/17/2020   History of Present Illness  Pt is an 87yo F that presented to ED from [facility] for chest pain. Pt recently seen at [facility] for falls,at the time brain MRI revealed remote R temporal insult, and L hip XR negative for fx. Significant PMH includes: CAD s/p MI s/p CABG, HLD, HTN, CKD (III), OAB, and sz.    Clinical Impression  Patient alert, in chair oriented to self and place. Reported L hip pain in sitting. Pt spoke with OT who spoke with family about PLOF, pt from [facility] recently after hospital admission.  The patient was able to perform sit <> stand with RW with CGA (minA to facilitate weight shift to edge of recliner), with cueing for hand placement to maximize safety. She was able to ambulate in the room ~53ft with RW and CGA. Pt exhibited very decreased gait velocity, flexed trunk, decreased  step height/clearance bilaterally, and occasional step to gait noted for LLE. Returned to recliner, and able to participate in exercises with constant verbal cueing.  Overall the patient demonstrated deficits (see "PT Problem List") that impede the patient's functional abilities, safety, and mobility and would benefit from skilled PT intervention.  Recommendation at this time is SNF to maximize safety, mobility, and independence.     Follow Up Recommendations SNF;Supervision/Assistance - 24 hour    Equipment Recommendations  Other (comment) (TBD next venue of care)    Recommendations for Other Services       Precautions / Restrictions Precautions Precautions: Fall Restrictions Weight Bearing Restrictions: No      Mobility  Bed Mobility Overal bed mobility: Needs Assistance Bed Mobility: Supine to Sit     Supine to sit: Supervision     General bed mobility comments: MIN VCs for sequencing/initiation    Transfers Overall transfer level: Needs assistance Equipment used:  Rolling walker (2 wheeled) Transfers: Sit to/from Stand Sit to Stand: Min guard;Min assist         General transfer comment: minA to assist pt with weight shifting towards edge of recliner in prep for transfers. with cueing, pt able to stand with CGA and switch hands to walker once upright  Ambulation/Gait Ambulation/Gait assistance: Min guard Gait Distance (Feet): 22 Feet Assistive device: Rolling walker (2 wheeled)       General Gait Details: Pt able to ambulate to door and back in room; very decreased gait velocity, flexed trunk, decreased  step height/clearance bilaterally, occasional step to gait noted for LLE.  Stairs            Wheelchair Mobility    Modified Rankin (Stroke Patients Only)       Balance Overall balance assessment: Needs assistance Sitting-balance support: No upper extremity supported;Feet supported Sitting balance-Leahy Scale: Good     Standing balance support: During functional activity;Single extremity supported Standing balance-Leahy Scale: Fair Standing balance comment: able to stand with fair balance, CGA for dynamic balance/ambulation                             Pertinent Vitals/Pain Pain Assessment: No/denies pain    Home Living Family/patient expects to be discharged to:: Private residence Living Arrangements: Alone Available Help at Discharge: Family;Available PRN/intermittently Type of Home: House         Home Equipment: Walker - 4 wheels;Cane - single point;Shower seat;Toilet riser Additional Comments: Per OT: "Per phone call to  daughter: daughter and grandson have been staying with pt as able last ~6 months since her spouse passed away."    Prior Function Level of Independence: Needs assistance   Gait / Transfers Assistance Needed: Pt from SNF since recent hip fx, working with PT  ADL's / Homemaking Assistance Needed: Prior to recent hip fx pt required PRN assist for bathing/dressing        Hand  Dominance   Dominant Hand: Right    Extremity/Trunk Assessment   Upper Extremity Assessment Upper Extremity Assessment: Difficult to assess due to impaired cognition;Defer to OT evaluation    Lower Extremity Assessment Lower Extremity Assessment: Difficult to assess due to impaired cognition;Generalized weakness    Cervical / Trunk Assessment Cervical / Trunk Assessment: Kyphotic  Communication   Communication: No difficulties  Cognition Arousal/Alertness: Awake/alert Behavior During Therapy: WFL for tasks assessed/performed Overall Cognitive Status: History of cognitive impairments - at baseline                                 General Comments: oriented to self and place      General Comments      Exercises Other Exercises Other Exercises: educated on importance to continue mobility Other Exercises: seated marching, heel/toe raises, and LAQs performed with constant verbal cueing   Assessment/Plan    PT Assessment Patient needs continued PT services  PT Problem List Decreased strength;Decreased mobility;Decreased safety awareness;Decreased coordination;Decreased activity tolerance;Decreased cognition;Decreased balance       PT Treatment Interventions Gait training;Functional mobility training;Therapeutic activities;Therapeutic exercise;Balance training;Neuromuscular re-education;DME instruction;Patient/family education    PT Goals (Current goals can be found in the Care Plan section)  Acute Rehab PT Goals Patient Stated Goal: to get better PT Goal Formulation: With patient/family Time For Goal Achievement: 07/01/20 Potential to Achieve Goals: Fair    Frequency Min 2X/week   Barriers to discharge        Co-evaluation               AM-PAC PT "6 Clicks" Mobility  Outcome Measure Help needed turning from your back to your side while in a flat bed without using bedrails?: A Little Help needed moving from lying on your back to sitting on the  side of a flat bed without using bedrails?: A Little Help needed moving to and from a bed to a chair (including a wheelchair)?: A Little Help needed standing up from a chair using your arms (e.g., wheelchair or bedside chair)?: A Little Help needed to walk in hospital room?: A Lot Help needed climbing 3-5 steps with a railing? : A Lot 6 Click Score: 16    End of Session Equipment Utilized During Treatment: Gait belt Activity Tolerance: Patient tolerated treatment well Patient left: in bed;with call bell/phone within reach;with bed alarm set Nurse Communication: Mobility status PT Visit Diagnosis: Unsteadiness on feet (R26.81);Repeated falls (R29.6);Muscle weakness (generalized) (M62.81);History of falling (Z91.81);Difficulty in walking, not elsewhere classified (R26.2)    Time: 5621-3086 PT Time Calculation (min) (ACUTE ONLY): 23 min   Charges:   PT Evaluation $PT Eval Low Complexity: 1 Low PT Treatments $Therapeutic Exercise: 8-22 mins       Lieutenant Diego PT, DPT 4:22 PM,06/17/20

## 2020-06-17 NOTE — Care Plan (Signed)
Pt and grandson informed of transport and d/c at this time. IV removed. Pt denies questions. Lidoderm Patch placed to left posterior hip.

## 2020-06-17 NOTE — Plan of Care (Signed)

## 2020-06-17 NOTE — NC FL2 (Signed)
[place] LEVEL OF CARE SCREENING TOOL     IDENTIFICATION  Patient Name: Monique Burns Birthdate: Dec 18, 1930 Sex: female Admission Date (Current Location): 06/16/2020  [place] and [place] Number:  Engineering geologist and Address:  [facility], [address], [place], [place] [address]      Provider Number: [id number]  Attending Physician Name and Address:  Sidney Ace, MD  Relative Name and Phone Number:       Current Level of Care: Hospital Recommended Level of Care: [place] Prior Approval Number:    Date Approved/Denied:   PASRR Number: [id number]  Discharge Plan: SNF    Current Diagnoses: Patient Active Problem List   Diagnosis Date Noted  . Debility 06/16/2020  . Left hip pain   . Lobar pneumonia ([place])   . Acute metabolic encephalopathy   . Falls frequently   . Elevated d-dimer   . Anemia   . CAP (community acquired pneumonia) 05/28/2020  . Overactive bladder 04/08/2020  . Urge incontinence 04/08/2020  . Nocturia 04/08/2020  . Unstable angina ([place]) 12/20/2017  . Protein malnutrition ([place]) 11/27/2015  . Chest pain 11/26/2015  . Chronic kidney disease 04/05/2015  . HLD (hyperlipidemia) 04/05/2015  . Essential hypertension 04/05/2015  . Decreased leukocytes 04/05/2015  . Arthritis, degenerative 04/05/2015  . Disease of thyroid gland 04/05/2015  . Acute myocardial infarction of anterior wall ([place]) 02/28/2015  . Degenerative arthritis of hip 10/31/2013    Orientation RESPIRATION BLADDER Height & Weight     Self,Time,Situation,Place  Normal Continent,External catheter Weight: 114 lb 13.8 oz (52.1 kg) Height:  5\' 7"  (170.2 cm)  BEHAVIORAL SYMPTOMS/MOOD NEUROLOGICAL BOWEL NUTRITION STATUS   (None)  (None) Continent Diet (Heart healthy)  AMBULATORY STATUS COMMUNICATION OF NEEDS Skin   Limited Assist Verbally Bruising                       Personal Care Assistance Level of Assistance   Bathing,Feeding,Dressing Bathing Assistance: Limited assistance Feeding assistance: Limited assistance Dressing Assistance: Limited assistance     Functional Limitations Info  Sight,Hearing,Speech Sight Info: Adequate Hearing Info: Adequate Speech Info: Adequate    SPECIAL CARE FACTORS FREQUENCY  PT (By licensed PT),OT (By licensed OT)     PT Frequency: 5 x week OT Frequency: 5 x week            Contractures Contractures Info: Not present    Additional Factors Info  Code Status,Allergies Code Status Info: Partial: No intubation/mechanical ventilation Allergies Info: Brilinta (Ticagrelor), Imdur (Isosorbide Nitrate)           Current Medications (06/17/2020):  This is the current hospital active medication list Current Facility-Administered Medications  Medication Dose Route Frequency Provider Last Rate Last Admin  . acetaminophen (TYLENOL) tablet 325 mg  325 mg Oral Q4H PRN Cox, Amy N, DO   325 mg at 06/17/20 1051  . [START ON 06/18/2020] amLODipine (NORVASC) tablet 5 mg  5 mg Oral Daily Callwood, Dwayne D, MD      . aspirin EC tablet 81 mg  81 mg Oral Daily Cox, Amy N, DO   81 mg at 06/17/20 0741  . atorvastatin (LIPITOR) tablet 80 mg  80 mg Oral QPM Cox, Amy N, DO   80 mg at 06/16/20 1705  . DULoxetine (CYMBALTA) DR capsule 30 mg  30 mg Oral QPM Cox, Amy N, DO   30 mg at 06/16/20 1709  . enoxaparin (LOVENOX) injection 30 mg  30 mg Subcutaneous Q24H Cox, Amy N, DO      . lamoTRIgine (LAMICTAL) tablet 25 mg  25 mg Oral QPM Cox, Amy N, DO   25 mg at 06/16/20 1709  . lidocaine (LIDODERM) 5 % 1 patch  1 patch Transdermal Q24H Cox, Amy N, DO   1 patch at 06/16/20 1711  . metoprolol succinate (TOPROL-XL) 24 hr tablet 25 mg  25 mg Oral QPM Cox, Amy N, DO   25 mg at 06/16/20 1706  . nitroGLYCERIN (NITROSTAT) SL tablet 0.4 mg  0.4 mg Sublingual Q5 min PRN Cox, Amy N, DO   0.4 mg at 06/16/20 1048  . ondansetron (ZOFRAN) injection 4 mg  4 mg Intravenous Q6H PRN Cox, Amy N, DO       . ranolazine (RANEXA) 12 hr tablet 1,000 mg  1,000 mg Oral BID Cox, Amy N, DO   1,000 mg at 06/17/20 0740  . Vibegron TABS 75 mg  75 mg Oral Daily Cox, Amy N, DO         Discharge Medications: Please see discharge summary for a list of discharge medications.  Relevant Imaging Results:  Relevant Lab Results:   Additional Information SS#: [id number]  Candie Chroman, LCSW

## 2020-06-17 NOTE — Discharge Instructions (Signed)
Angina  Angina is discomfort or pain in the chest, neck, arm, jaw, or back. The discomfort is caused by a lack of blood in the middle layer of the heart wall. The middle layer of the heart wall is called the myocardium. What are the causes? This condition is caused by a buildup of fat and cholesterol, or plaque, in your arteries. This buildup narrows the arteries and makes it hard for blood to flow. What increases the risk? Main risks  High levels of cholesterol in your blood.  High blood pressure.  Diabetes.  Family history of heart disease.  Not exercising or moving enough.  Depression.  Having had radiation treatment on the left side of your chest. Other risks  Tobacco use.  Too much body weight (obesity).  A diet that is high in unhealthy fats (saturated fats).  Stress.  Using drugs, such as cocaine. Risks for women  Being older than 55 years.  Being in menopause. This is the time when a woman no longer has a menstrual period. What are the signs or symptoms? Symptoms in all people  Chest pain, which may: ? Feel like a crushing or squeezing in the chest. ? Feel like a tightness, pressure, fullness, or heaviness in the chest. ? Last for more than a few minutes at a time. ? Stop and come back (recur) after a few minutes.  Pain in the neck, arm, jaw, or back.  Heartburn or upset stomach (indigestion) for no reason.  Being short of breath.  Feeling like you may vomit (nauseous).  Sudden cold sweats. Symptoms in women and people with diabetes  Tiredness.  Worry and anxiety.  Weakness.  Dizziness or fainting. How is this treated? This condition may be treated with:  Medicines. These can be given to prevent blood clots, stop a heart attack, lower blood pressure, or treat other risk factors.  A procedure to widen a narrowed or blocked artery in the heart.  Surgery to allow blood to go around a blocked artery. Follow these instructions at  home: Medicines  Take over-the-counter and prescription medicines only as told by your doctor.  Do not take these medicines unless your doctor says that you can: ? NSAIDs. These include:  Ibuprofen.  Naproxen. ? Vitamin supplements that have vitamin A, vitamin E, or both. ? Hormone therapy that contains estrogen with or without progestin. Eating and drinking  Eat a heart-healthy diet that includes: ? Lots of fresh fruits and vegetables. ? Whole grains. ? Low-fat (lean) protein. ? Low-fat dairy products.  Follow instructions from your doctor about what you cannot eat or drink.   Activity  Follow an exercise program that your doctor tells you.  Talk with your doctor about joining a program to make your heart strong again (cardiac rehab).  When you feel tired, take a break. Plan breaks if you know you are going to feel tired. Lifestyle  Do not smoke or use any products that contain nicotine or tobacco. If you need help quitting, ask your doctor.  If your doctor says you can drink alcohol: ? Limit how much you have to:  0-1 drink a day for women who are not pregnant.  0-2 drinks a day for men. ? Know how much alcohol is in your drink. In the [place] one drink equals one 12 oz bottle of beer (355 mL), one 5 oz glass of wine (148 mL), or one 1 oz glass of hard liquor (44 mL).   General instructions  Stay at  a healthy weight. If told to lose weight, work with your doctor to do lose weight safely.  Learn to manage stress. If you need help, ask your doctor.  Keep your vaccines up to date. Get a flu shot every year.  Talk with your doctor if you feel sad. Take a screening test to see if you are at risk for depression.  Work with your doctor to manage any other health problems that you have. These may include diabetes or high blood pressure.  Keep all follow-up visits. Get help right away if:  You have pain in your chest, neck, arm, jaw, or back, and the pain: ? Lasts more  than a few minutes. ? Comes back. ? Does not get better after you take medicine under your tongue (sublingual nitroglycerin). ? Keeps getting worse. ? Comes more often.  You have any of these problems: ? Sweating a lot. ? Heartburn or upset stomach. ? Shortness of breath. ? Trouble breathing. ? Feeling like you may vomit. ? Vomiting. ? Feeling more tired than normal. ? Feeling nervous or worrying more than normal. ? Weakness.  You are dizzy or light-headed all of a sudden.  You faint. These symptoms may be an emergency. Get help right away. Call your local emergency services (911 in the [place]  Do not wait to see if the symptoms will go away.  Do not drive yourself to the hospital. Summary  Angina is discomfort or pain in the chest, neck, arm, neck, or back.  Symptoms include chest pain, heartburn or upset stomach, and shortness of breath.  Women or people with diabetes may have other symptoms, such as feeling nervous, being worried, or being weak or tired.  Take all medicines only as told by your doctor.  You should eat a heart-healthy diet and follow an exercise program. This information is not intended to replace advice given to you by your health care provider. Make sure you discuss any questions you have with your health care provider. Document Revised: 10/17/2019 Document Reviewed: 10/17/2019 Elsevier Patient Education  2021 [employer].

## 2020-06-17 NOTE — TOC Transition Note (Signed)
Transition of Care [facility]) - CM/SW Discharge Note   Patient Details  Name: Monique Burns MRN: [id number] Date of Birth: 06-28-1930  Transition of [facility]) CM/SW Contact:  Candie Chroman, LCSW Phone Number: 06/17/2020, 3:32 PM   Clinical Narrative:  Patient is a rehab resident at [facility]. Confirmed with her, grandson, and daughter of plan to return there today. RN will call report to [phone]. EMS transport set up for 4:00. She is 4th on the list. No further concerns. CSW signing off.    Final next level of care: Skilled Nursing Facility Barriers to Discharge: No Barriers Identified   Patient Goals and CMS Choice     Choice offered to / list presented to : Patient  Discharge Placement   Existing PASRR number confirmed : 06/17/20          Patient chooses bed at: [facility] Patient to be transferred to facility by: EMS Name of family member notified: Grandson at bedside and daughter Wynelle Beckmann Patient and family notified of of transfer: 06/17/20  Discharge Plan and Services                                     Social Determinants of Health (SDOH) Interventions     Readmission Risk Interventions No flowsheet data found.

## 2020-06-17 NOTE — [place] Notes (Signed)
Initial Nutrition Assessment  DOCUMENTATION CODES:   Severe malnutrition in context of social or environmental circumstances  INTERVENTION:   Recommend Ensure Enlive po BID, each supplement provides 350 kcal and 20 grams of protein  Recommend MVI daily  Liberalize diet   NUTRITION DIAGNOSIS:   Severe Malnutrition related to social / environmental circumstances as evidenced by severe fat depletion,severe muscle depletion.  GOAL:   Patient will meet greater than or equal to 90% of their needs  MONITOR:   PO intake,Supplement acceptance,Labs,Weight trends,Skin,I & O's  REASON FOR ASSESSMENT:   Consult Assessment of nutrition requirement/status  ASSESSMENT:   87 y.o. female with medical history significant for frequent falls, CAD s/p PCI in December 2016 and CABG x 4 in August 2017, hypertension, depression/anxiety, seizure-like activity, CKD 3B and recent COVID 19 who presented to the emergency department from [facility] for chest pain.   Met with pt in room today. Pt reports fair appetite and oral intake at baseline. Pt reports that she eats better at home then in the hospital. Pt reports that she did not eat breakfast today but reports that she ate about 50% of her lunch; this is also documented in chart. Pt reports that she drinks chocolate Ensure at home and would like to have this in the hospital. RD will add supplements and MVI to help pt meet her estimated needs. RD will also liberalize pt's diet. Per chart, pt appears weight stable at baseline.    Medications reviewed and include: aspirin, lovenox  Labs reviewed: BUN 26(H), creat 1.34(H) Hgb 10.1(L), Hct 30.5(L)  NUTRITION - FOCUSED PHYSICAL EXAM:  Flowsheet Row Most Recent Value  Orbital Region Severe depletion  Upper Arm Region Severe depletion  Thoracic and Lumbar Region Severe depletion  Buccal Region Severe depletion  Temple Region Severe depletion  Clavicle Bone Region Severe depletion  Clavicle and Acromion  Bone Region Severe depletion  Scapular Bone Region Severe depletion  Dorsal Hand Severe depletion  Patellar Region Severe depletion  Anterior Thigh Region Severe depletion  Posterior Calf Region Severe depletion  Edema (RD Assessment) Moderate  Hair Reviewed  Eyes Reviewed  Mouth Reviewed  Skin Reviewed  Nails Reviewed     Diet Order:   Diet Order            Diet regular Room service appropriate? Yes; Fluid consistency: Thin  Diet effective now           Diet - low sodium heart healthy                EDUCATION NEEDS:   Education needs have been addressed  Skin:  Skin Assessment: Reviewed RN Assessment (ecchymosis)  Last BM:  2/10- type 7  Height:   Ht Readings from Last 1 Encounters:  06/16/20 _0  (1.702 m)    Weight:   Wt Readings from Last 1 Encounters:  06/16/20 52.1 kg    Ideal Body Weight:  61.36 kg  BMI:  Body mass index is 17.99 kg/m.  Estimated Nutritional Needs:   Kcal:  1400-1600kcal/day  Protein:  70-80g/day  Fluid:  1.4-1.6L/day  Koleen Distance MS, RD, LDN Please refer to [facility] for RD and/or RD on-call/weekend/after hours pager

## 2020-06-19 ENCOUNTER — Emergency Department: Payer: Medicare Other

## 2020-06-19 ENCOUNTER — Emergency Department
Admission: EM | Admit: 2020-06-19 | Discharge: 2020-06-20 | Disposition: A | Discharge status: Home / Self Care | Payer: Medicare Other | Attending: Emergency Medicine | Admitting: Emergency Medicine

## 2020-06-19 ENCOUNTER — Other Ambulatory Visit: Payer: Self-pay

## 2020-06-19 DIAGNOSIS — Z85828 Personal history of other malignant neoplasm of skin: Secondary | ICD-10-CM | POA: Diagnosis not present

## 2020-06-19 DIAGNOSIS — N189 Chronic kidney disease, unspecified: Secondary | ICD-10-CM | POA: Diagnosis not present

## 2020-06-19 DIAGNOSIS — I952 Hypotension due to drugs: Secondary | ICD-10-CM | POA: Diagnosis not present

## 2020-06-19 DIAGNOSIS — I2510 Atherosclerotic heart disease of native coronary artery without angina pectoris: Secondary | ICD-10-CM | POA: Insufficient documentation

## 2020-06-19 DIAGNOSIS — Z79899 Other long term (current) drug therapy: Secondary | ICD-10-CM | POA: Insufficient documentation

## 2020-06-19 DIAGNOSIS — R079 Chest pain, unspecified: Secondary | ICD-10-CM | POA: Diagnosis present

## 2020-06-19 DIAGNOSIS — Z7982 Long term (current) use of aspirin: Secondary | ICD-10-CM | POA: Diagnosis not present

## 2020-06-19 DIAGNOSIS — I509 Heart failure, unspecified: Secondary | ICD-10-CM | POA: Insufficient documentation

## 2020-06-19 DIAGNOSIS — I130 Hypertensive heart and chronic kidney disease with heart failure and stage 1 through stage 4 chronic kidney disease, or unspecified chronic kidney disease: Secondary | ICD-10-CM | POA: Insufficient documentation

## 2020-06-19 DIAGNOSIS — Z955 Presence of coronary angioplasty implant and graft: Secondary | ICD-10-CM | POA: Insufficient documentation

## 2020-06-19 DIAGNOSIS — Z951 Presence of aortocoronary bypass graft: Secondary | ICD-10-CM | POA: Insufficient documentation

## 2020-06-19 DIAGNOSIS — Z9660 Presence of unspecified orthopedic joint implant: Secondary | ICD-10-CM | POA: Diagnosis not present

## 2020-06-19 DIAGNOSIS — E039 Hypothyroidism, unspecified: Secondary | ICD-10-CM | POA: Diagnosis not present

## 2020-06-19 LAB — CBC
HCT: 33.6 % — ABNORMAL LOW (ref 36.0–46.0)
Hemoglobin: 10.9 g/dL — ABNORMAL LOW (ref 12.0–15.0)
MCH: 32.2 pg (ref 26.0–34.0)
MCHC: 32.4 g/dL (ref 30.0–36.0)
MCV: 99.1 fL (ref 80.0–100.0)
Platelets: 300 10*3/uL (ref 150–400)
RBC: 3.39 MIL/uL — ABNORMAL LOW (ref 3.87–5.11)
RDW: 12.6 % (ref 11.5–15.5)
WBC: 4.8 10*3/uL (ref 4.0–10.5)
nRBC: 0 % (ref 0.0–0.2)

## 2020-06-19 LAB — BASIC METABOLIC PANEL
Anion gap: 12 (ref 5–15)
BUN: 32 mg/dL — ABNORMAL HIGH (ref 8–23)
CO2: 21 mmol/L — ABNORMAL LOW (ref 22–32)
Calcium: 9.6 mg/dL (ref 8.9–10.3)
Chloride: 103 mmol/L (ref 98–111)
Creatinine, Ser: 1.55 mg/dL — ABNORMAL HIGH (ref 0.44–1.00)
GFR, Estimated: 32 mL/min — ABNORMAL LOW (ref >60)
Glucose, Bld: 107 mg/dL — ABNORMAL HIGH (ref 70–99)
Potassium: 4.1 mmol/L (ref 3.5–5.1)
Sodium: 136 mmol/L (ref 135–145)

## 2020-06-19 LAB — TROPONIN I (HIGH SENSITIVITY): Troponin I (High Sensitivity): 17 ng/L (ref <18)

## 2020-06-19 MED ORDER — ACETAMINOPHEN 500 MG PO TABS
1000.0000 mg | ORAL_TABLET | Freq: Once | ORAL | Status: AC
  Administered 2020-06-19: 1000 mg via ORAL
  Filled 2020-06-19: qty 2

## 2020-06-19 NOTE — ED Triage Notes (Signed)
Pt endorses generalized chest pain, cough, and nausea, unable to recall when symptoms started or describe pain, pt not a very good historian, disoriented to time. Pt denies shortness of breath, lightheadedness, or nausea at this time. Pt appears to be in no acute distress, respirations even and unlabored, vital signs stable at this time.

## 2020-06-19 NOTE — ED Notes (Signed)
Patient to waiting room by EMS from local nsg facility [facility]) for complaint of chest pain.  EMS interventions -- hr 70's, bp 134/83, pulse oxi 98% on room air, 12-lead with normal sinus rhythm with elevation in V2.  EMS gave Aspirin 324 mg by mouth and Ntg spray x 3.

## 2020-06-19 NOTE — ED Provider Notes (Incomplete)
[facility] Emergency Department Provider Note  ____________________________________________  Time seen: Approximately 11:48 PM  I have reviewed the triage vital signs and the nursing notes.   HISTORY  Chief Complaint Chest Pain   HPI Monique Burns is a 87 y.o. female the history of CAD status post CABG, hypertension, hyperlipidemia, chronic kidney disease, CHF, anemia, hypothyroidism who presents accompanied by her daughter, who is patient's healthcare power of attorney, for low blood pressure.  According to the daughter,  patient was hospitalized for hypertension and discharged 2 days ago.  Medication changes were made during that admission which daughter suspects are the cause of patient's hypotension.  Her nitro was changed from as needed to standing once a day and her amlodipine was changed from 2.5 mg to 5 mg daily.  Daughter reports that today patient's diastolic blood pressure was in the 40s and she was instructed by the cardiology to bring her mother to the emergency room if the diastolic pressure was ever below 60.  Daughter was contacted by the rehab facility to let her know about the changes in the blood pressure and decided to bring her here for evaluation.  Patient has several complaints today which are all pain related including hip pain, foot pain, and according to the patient "all body pain."  According to the daughter this is normal for patient.  She has a bruise in her left foot from a fall which is why she is in rehab and also has bursitis of her hip which gives her pain all the time.  Patient denies headache or chest pain, no dizziness.  Past Medical History:  Diagnosis Date  . Anemia   . Arthritis   . Blood dyscrasia    since being put on Brilinta after stent placement developing hematoma  . Cancer ([place])    basal cell left leg  . CHF (congestive heart failure) ([place])   . GERD (gastroesophageal reflux disease)   . Headache    h/o migraines   . Hyperlipidemia   . Hypertension   . Leukopenia   . MI (myocardial infarction) ([place]) 02/2015   s/p 4 stents and CABG in 2017  . Renal disorder    stage 3-Dr Caryl Comes keeping a check on kidney function  . Shortness of breath dyspnea    mild compared to prior stenting-pt states she would have to stop and rest if walking a mile  . Thyroid disease     Patient Active Problem List   Diagnosis Date Noted  . Debility 06/16/2020  . Left hip pain   . Lobar pneumonia ([place])   . Acute metabolic encephalopathy   . Falls frequently   . Elevated d-dimer   . Anemia   . CAP (community acquired pneumonia) 05/28/2020  . Overactive bladder 04/08/2020  . Urge incontinence 04/08/2020  . Nocturia 04/08/2020  . Unstable angina ([place]) 12/20/2017  . Protein malnutrition ([place]) 11/27/2015  . Chest pain 11/26/2015  . Chronic kidney disease 04/05/2015  . HLD (hyperlipidemia) 04/05/2015  . Essential hypertension 04/05/2015  . Decreased leukocytes 04/05/2015  . Arthritis, degenerative 04/05/2015  . Disease of thyroid gland 04/05/2015  . Acute myocardial infarction of anterior wall ([place]) 02/28/2015  . Degenerative arthritis of hip 10/31/2013    Past Surgical History:  Procedure Laterality Date  . ABDOMINAL HYSTERECTOMY    . CARDIAC CATHETERIZATION N/A 12/08/2015   Procedure: Left Heart Cath and Coronary Angiography;  Surgeon: Yolonda Kida, MD;  Location: [facility];  Service:  Cardiovascular;  Laterality: N/A;  . CORONARY ANGIOPLASTY WITH STENT PLACEMENT    . CORONARY STENT PLACEMENT  2016   x2  . EYE SURGERY Bilateral 2011  . INCISION AND DRAINAGE Left 08/26/2015   Procedure: INCISION AND DRAINAGE / HAND HEMATOMA;  Surgeon: Hessie Knows, MD;  Location: [facility];  Service: Orthopedics;  Laterality: Left;  . JOINT REPLACEMENT Bilateral    left 2016 and right 2015  . TONSILLECTOMY     3rd grade    Prior to Admission medications   Medication Sig Start Date End Date Taking? Authorizing  Provider  acetaminophen (TYLENOL) 500 MG tablet Take 500-1,000 mg by mouth every 6 (six) hours as needed for pain.    [provider]  amLODipine (NORVASC) 5 MG tablet Take 1 tablet (5 mg total) by mouth daily. 06/18/20   Sidney Ace, MD  aspirin EC 81 MG tablet Take 81 mg by mouth daily.    [provider]  atorvastatin (LIPITOR) 80 MG tablet Take 80 mg by mouth every evening.     [provider]  cycloSPORINE (RESTASIS) 0.05 % ophthalmic emulsion Place 1 drop into both eyes 2 (two) times daily.     [provider]  DULoxetine (CYMBALTA) 30 MG capsule Take 30 mg by mouth every evening.    [provider]  ferrous sulfate 325 (65 FE) MG tablet Take 1 tablet (325 mg total) by mouth daily with breakfast. 06/02/20   Nolberto Hanlon, MD  lamoTRIgine (LAMICTAL) 25 MG tablet Take 25 mg by mouth every evening.    [provider]  lidocaine (LIDODERM) 5 % Place 1 patch onto the skin daily. Remove & Discard patch within 12 hours or as directed by MD 06/01/20   Nolberto Hanlon, MD  metoprolol succinate (TOPROL-XL) 25 MG 24 hr tablet Take 25 mg by mouth every evening. 05/03/20   [provider]  Multiple Vitamin (MULTIVITAMIN ADULT PO) Take 1 tablet by mouth daily.    [provider]  nitroGLYCERIN (NITROSTAT) 0.4 MG SL tablet Place 0.4 mg under the tongue every 5 (five) minutes x 3 doses as needed for chest pain.    [provider]  nitroGLYCERIN (NITROSTAT) 0.4 MG SL tablet Place 1 tablet (0.4 mg total) under the tongue daily. 06/17/20   Sidney Ace, MD  ranolazine (RANEXA) 1000 MG SR tablet Take 1,000 mg by mouth 2 (two) times daily. 11/30/17   [provider]  Vibegron (GEMTESA) 75 MG TABS Take 75 mg by mouth daily. 05/10/20   Debroah Loop, PA-C    Allergies Brilinta [ticagrelor] and Imdur [isosorbide nitrate]  Family History  Problem Relation Age of Onset  . Breast cancer Paternal Grandmother   .  Brain cancer Mother   . Stroke Father   . Brain cancer Son     Social History Social History   Tobacco Use  . Smoking status: Never Smoker  . Smokeless tobacco: Never Used  Vaping Use  . Vaping Use: Never used  Substance Use Topics  . Alcohol use: No  . Drug use: No    Review of Systems  Constitutional: Negative for fever. Eyes: Negative for visual changes. ENT: Negative for sore throat. Neck: No neck pain  Cardiovascular: Negative for chest pain. Respiratory: Negative for shortness of breath. Gastrointestinal: Negative for abdominal pain, vomiting or diarrhea. Genitourinary: Negative for dysuria. Musculoskeletal: Negative for back pain. Skin: Negative for rash. Neurological: Negative for headaches, weakness or numbness. Psych: No SI or HI  ____________________________________________  PHYSICAL EXAM:  VITAL SIGNS: ED Triage Vitals  Enc Vitals Group     BP 06/19/20 2102 135/63     Pulse Rate 06/19/20 2102 73     Resp 06/19/20 2102 18     Temp 06/19/20 2102 98.2 F (36.8 C)     Temp Source 06/19/20 2102 Oral     SpO2 06/19/20 2102 98 %     Weight 06/19/20 2103 114 lb 10.2 oz (52 kg)     Height 06/19/20 2103 5\' 7"  (1.702 m)     Head Circumference --      Peak Flow --      Pain Score --      Pain Loc --      Pain Edu? --      Excl. in [place]? --     Constitutional: Alert and oriented. Well appearing and in no apparent distress. HEENT:      Head: Normocephalic and atraumatic.         Eyes: Conjunctivae are normal. Sclera is non-icteric.       Mouth/Throat: Mucous membranes are moist.       Neck: Supple with no signs of meningismus. Cardiovascular: Regular rate and rhythm. No murmurs, gallops, or rubs. 2+ symmetrical distal pulses are present in all extremities. No JVD. Respiratory: Normal respiratory effort. Lungs are clear to auscultation bilaterally.  Gastrointestinal: Soft, non tender, and non distended. Musculoskeletal:  No edema, cyanosis, or erythema  of extremities. Neurologic: Normal speech and language. Face is symmetric. Moving all extremities. No gross focal neurologic deficits are appreciated. Skin: Skin is warm, dry and intact. No rash noted. Psychiatric: Mood and affect are normal. Speech and behavior are normal.  ____________________________________________   LABS (all labs ordered are listed, but only abnormal results are displayed)  Labs Reviewed  BASIC METABOLIC PANEL - Abnormal; Notable for the following components:      Result Value   CO2 21 (*)    Glucose, Bld 107 (*)    BUN 32 (*)    Creatinine, Ser 1.55 (*)    GFR, Estimated 32 (*)    All other components within normal limits  CBC - Abnormal; Notable for the following components:   RBC 3.39 (*)    Hemoglobin 10.9 (*)    HCT 33.6 (*)    All other components within normal limits  TROPONIN I (HIGH SENSITIVITY)  TROPONIN I (HIGH SENSITIVITY)   ____________________________________________  EKG  ED ECG REPORT I, Rudene Re, the attending physician, personally viewed and interpreted this ECG.  NSR, rate 74, LAD, LVH, no concordant STE. No significant changes from prior.  ____________________________________________  RADIOLOGY  I have personally reviewed the images performed during this visit and I agree with the Radiologist's read.   Interpretation by Radiologist:  DG Chest 2 View  Result Date: 06/19/2020 CLINICAL DATA:  Chest pain with cough.  Nausea. EXAM: CHEST - 2 VIEW COMPARISON:  Radiograph and CT 3 days ago 06/16/2020 FINDINGS: Post median sternotomy and CABG. Heart is normal in size. Coronary stent is visualized. Aortic atherosclerosis. No acute airspace disease. No pulmonary edema. No pleural fluid or pneumothorax. Bones are under mineralized. No acute osseous abnormalities are seen. IMPRESSION: No acute chest findings.  Unchanged from imaging 3 days ago. Aortic Atherosclerosis (ICD10-I70.0). Electronically Signed   By: Keith Rake M.D.    On: 06/19/2020 21:37     ____________________________________________   PROCEDURES  Procedure(s) performed:yes .1-3 Lead EKG Interpretation Performed by: Rudene Re, MD Authorized by: Alfred Levins, [place],  MD     Interpretation: non-specific     ECG rate assessment: normal     Rhythm: sinus rhythm     Ectopy: none     Critical Care performed: *** None ____________________________________________   INITIAL IMPRESSION / ASSESSMENT AND PLAN / ED COURSE  87 y.o. female the history of CAD status post CABG, hypertension, hyperlipidemia, chronic kidney disease, CHF, anemia, hypothyroidism who presents accompanied by her daughter, who is patient's healthcare power of attorney, for low blood pressure. Patient was dc from [facility] 2 days ago with several changes in her meds including nitro from PRN to daily and amlodipine 2.5mg  to 5mg . She is asymptomatic from it. According to daughter systolic BP has been normal but diastolic was low (in the 10C). Here her BP is stable with MAP greater than 65.  Her EKG shows no signs of ischemic changes.  ***Review  labs Independently visualization and interpretation of Radiology Review old medical records Discuss care with family or another doc Get history from family Independent visualization of rhythm strip      _____________________________________________ Please note:  Patient was evaluated in Emergency Department today for the symptoms described in the history of present illness. Patient was evaluated in the context of the global COVID-19 pandemic, which necessitated consideration that the patient might be at risk for infection with the SARS-CoV-2 virus that causes COVID-19. Institutional protocols and algorithms that pertain to the evaluation of patients at risk for COVID-19 are in a state of rapid change based on information released by regulatory bodies including the CDC and federal and state organizations. These policies and algorithms were  followed during the patient's care in the ED.  Some ED evaluations and interventions may be delayed as a result of limited staffing during the pandemic.   [place] Controlled Substance Database was reviewed by me. ____________________________________________   FINAL CLINICAL IMPRESSION(S) / ED DIAGNOSES   Final diagnoses:  None      NEW MEDICATIONS STARTED DURING THIS VISIT:  ED Discharge Orders    None       Note:  This document was prepared using Dragon voice recognition software and may include unintentional dictation errors.

## 2020-06-19 NOTE — ED Provider Notes (Signed)
[facility] Emergency Department Provider Note  ____________________________________________  Time seen: Approximately 11:48 PM  I have reviewed the triage vital signs and the nursing notes.   HISTORY  Chief Complaint Chest Pain   HPI Monique Burns is a 87 y.o. female the history of CAD status post CABG, hypertension, hyperlipidemia, chronic kidney disease, CHF, anemia, hypothyroidism who presents accompanied by her daughter, who is patient's healthcare power of attorney, for low blood pressure.  According to the daughter,  patient was hospitalized for hypertension and discharged 2 days ago.  Medication changes were made during that admission which daughter suspects are the cause of patient's hypotension.  Her nitro was changed from as needed to standing once a day and her amlodipine was changed from 2.5 mg to 5 mg daily.  Daughter reports that today patient's diastolic blood pressure was in the 40s and she was instructed by the cardiology to bring her mother to the emergency room if the diastolic pressure was ever below 60.  Daughter was contacted by the rehab facility to let her know about the changes in the blood pressure and decided to bring her here for evaluation.  Patient has several complaints today which are all pain related including hip pain, foot pain, and according to the patient "all body pain."  According to the daughter this is normal for patient.  She has a bruise in her left foot from a fall which is why she is in rehab and also has bursitis of her hip which gives her pain all the time.  Patient denies headache or chest pain, no dizziness.  Past Medical History:  Diagnosis Date  . Anemia   . Arthritis   . Blood dyscrasia    since being put on Brilinta after stent placement developing hematoma  . Cancer ([place])    basal cell left leg  . CHF (congestive heart failure) ([place])   . GERD (gastroesophageal reflux disease)   . Headache    h/o migraines   . Hyperlipidemia   . Hypertension   . Leukopenia   . MI (myocardial infarction) ([place]) 02/2015   s/p 4 stents and CABG in 2017  . Renal disorder    stage 3-Dr Caryl Comes keeping a check on kidney function  . Shortness of breath dyspnea    mild compared to prior stenting-pt states she would have to stop and rest if walking a mile  . Thyroid disease     Patient Active Problem List   Diagnosis Date Noted  . Debility 06/16/2020  . Left hip pain   . Lobar pneumonia ([place])   . Acute metabolic encephalopathy   . Falls frequently   . Elevated d-dimer   . Anemia   . CAP (community acquired pneumonia) 05/28/2020  . Overactive bladder 04/08/2020  . Urge incontinence 04/08/2020  . Nocturia 04/08/2020  . Unstable angina ([place]) 12/20/2017  . Protein malnutrition ([place]) 11/27/2015  . Chest pain 11/26/2015  . Chronic kidney disease 04/05/2015  . HLD (hyperlipidemia) 04/05/2015  . Essential hypertension 04/05/2015  . Decreased leukocytes 04/05/2015  . Arthritis, degenerative 04/05/2015  . Disease of thyroid gland 04/05/2015  . Acute myocardial infarction of anterior wall ([place]) 02/28/2015  . Degenerative arthritis of hip 10/31/2013    Past Surgical History:  Procedure Laterality Date  . ABDOMINAL HYSTERECTOMY    . CARDIAC CATHETERIZATION N/A 12/08/2015   Procedure: Left Heart Cath and Coronary Angiography;  Surgeon: Yolonda Kida, MD;  Location: [place] [facility];  Service:  Cardiovascular;  Laterality: N/A;  . CORONARY ANGIOPLASTY WITH STENT PLACEMENT    . CORONARY STENT PLACEMENT  2016   x2  . EYE SURGERY Bilateral 2011  . INCISION AND DRAINAGE Left 08/26/2015   Procedure: INCISION AND DRAINAGE / HAND HEMATOMA;  Surgeon: Hessie Knows, MD;  Location: [facility] ORS;  Service: Orthopedics;  Laterality: Left;  . JOINT REPLACEMENT Bilateral    left 2016 and right 2015  . TONSILLECTOMY     3rd grade    Prior to Admission medications   Medication Sig Start Date End Date Taking? Authorizing  Provider  nitroGLYCERIN (NITROSTAT) 0.4 MG SL tablet Place 1 tablet (0.4 mg total) under the tongue once as needed for up to 1 dose for chest pain. 06/20/20  Yes Alfred Levins, [place], MD  acetaminophen (TYLENOL) 500 MG tablet Take 500-1,000 mg by mouth every 6 (six) hours as needed for pain.    [provider]  amLODipine (NORVASC) 2.5 MG tablet Take 1 tablet (2.5 mg total) by mouth daily. 06/20/20   Rudene Re, MD  aspirin EC 81 MG tablet Take 81 mg by mouth daily.    [provider]  atorvastatin (LIPITOR) 80 MG tablet Take 80 mg by mouth every evening.     [provider]  cycloSPORINE (RESTASIS) 0.05 % ophthalmic emulsion Place 1 drop into both eyes 2 (two) times daily.     [provider]  DULoxetine (CYMBALTA) 30 MG capsule Take 30 mg by mouth every evening.    [provider]  ferrous sulfate 325 (65 FE) MG tablet Take 1 tablet (325 mg total) by mouth daily with breakfast. 06/02/20   Nolberto Hanlon, MD  lamoTRIgine (LAMICTAL) 25 MG tablet Take 25 mg by mouth every evening.    [provider]  lidocaine (LIDODERM) 5 % Place 1 patch onto the skin daily. Remove & Discard patch within 12 hours or as directed by MD 06/01/20   Nolberto Hanlon, MD  metoprolol succinate (TOPROL-XL) 25 MG 24 hr tablet Take 25 mg by mouth every evening. 05/03/20   [provider]  Multiple Vitamin (MULTIVITAMIN ADULT PO) Take 1 tablet by mouth daily.    [provider]  ranolazine (RANEXA) 1000 MG SR tablet Take 1,000 mg by mouth 2 (two) times daily. 11/30/17   [provider]  Vibegron (GEMTESA) 75 MG TABS Take 75 mg by mouth daily. 05/10/20   Debroah Loop, PA-C    Allergies Brilinta [ticagrelor] and Imdur [isosorbide nitrate]  Family History  Problem Relation Age of Onset  . Breast cancer Paternal Grandmother   . Brain cancer Mother   . Stroke Father   . Brain cancer Son     Social History Social History   Tobacco Use   . Smoking status: Never Smoker  . Smokeless tobacco: Never Used  Vaping Use  . Vaping Use: Never used  Substance Use Topics  . Alcohol use: No  . Drug use: No    Review of Systems  Constitutional: Negative for fever. Eyes: Negative for visual changes. ENT: Negative for sore throat. Neck: No neck pain  Cardiovascular: Negative for chest pain. Respiratory: Negative for shortness of breath. Gastrointestinal: Negative for abdominal pain, vomiting or diarrhea. Genitourinary: Negative for dysuria. Musculoskeletal: Negative for back pain. Skin: Negative for rash. Neurological: Negative for headaches, weakness or numbness. Psych: No SI or HI  ____________________________________________   PHYSICAL EXAM:  VITAL SIGNS: ED Triage Vitals  Enc Vitals Group     BP 06/19/20 2102 135/63  Pulse Rate 06/19/20 2102 73     Resp 06/19/20 2102 18     Temp 06/19/20 2102 98.2 F (36.8 C)     Temp Source 06/19/20 2102 Oral     SpO2 06/19/20 2102 98 %     Weight 06/19/20 2103 114 lb 10.2 oz (52 kg)     Height 06/19/20 2103 5\' 7"  (1.702 m)     Head Circumference --      Peak Flow --      Pain Score --      Pain Loc --      Pain Edu? --      Excl. in [place]? --     Constitutional: Alert and oriented. Well appearing and in no apparent distress. HEENT:      Head: Normocephalic and atraumatic.         Eyes: Conjunctivae are normal. Sclera is non-icteric.       Mouth/Throat: Mucous membranes are moist.       Neck: Supple with no signs of meningismus. Cardiovascular: Regular rate and rhythm. No murmurs, gallops, or rubs. 2+ symmetrical distal pulses are present in all extremities. No JVD. Respiratory: Normal respiratory effort. Lungs are clear to auscultation bilaterally.  Gastrointestinal: Soft, non tender, and non distended. Musculoskeletal:  No edema, cyanosis, or erythema of extremities. Neurologic: Normal speech and language. Face is symmetric. Moving all extremities. No gross focal  neurologic deficits are appreciated. Skin: Skin is warm, dry and intact. No rash noted. Psychiatric: Mood and affect are normal. Speech and behavior are normal.  ____________________________________________   LABS (all labs ordered are listed, but only abnormal results are displayed)  Labs Reviewed  BASIC METABOLIC PANEL - Abnormal; Notable for the following components:      Result Value   CO2 21 (*)    Glucose, Bld 107 (*)    BUN 32 (*)    Creatinine, Ser 1.55 (*)    GFR, Estimated 32 (*)    All other components within normal limits  CBC - Abnormal; Notable for the following components:   RBC 3.39 (*)    Hemoglobin 10.9 (*)    HCT 33.6 (*)    All other components within normal limits  TROPONIN I (HIGH SENSITIVITY)  TROPONIN I (HIGH SENSITIVITY)   ____________________________________________  EKG  ED ECG REPORT I, Rudene Re, the attending physician, personally viewed and interpreted this ECG.  NSR, rate 74, LAD, LVH, no concordant STE. No significant changes from prior.  ____________________________________________  RADIOLOGY  I have personally reviewed the images performed during this visit and I agree with the Radiologist's read.   Interpretation by Radiologist:  DG Chest 2 View  Result Date: 06/19/2020 CLINICAL DATA:  Chest pain with cough.  Nausea. EXAM: CHEST - 2 VIEW COMPARISON:  Radiograph and CT 3 days ago 06/16/2020 FINDINGS: Post median sternotomy and CABG. Heart is normal in size. Coronary stent is visualized. Aortic atherosclerosis. No acute airspace disease. No pulmonary edema. No pleural fluid or pneumothorax. Bones are under mineralized. No acute osseous abnormalities are seen. IMPRESSION: No acute chest findings.  Unchanged from imaging 3 days ago. Aortic Atherosclerosis (ICD10-I70.0). Electronically Signed   By: Keith Rake M.D.   On: 06/19/2020 21:37     ____________________________________________   PROCEDURES  Procedure(s)  performed:yes .1-3 Lead EKG Interpretation Performed by: Rudene Re, MD Authorized by: Rudene Re, MD     Interpretation: non-specific     ECG rate assessment: normal     Rhythm: sinus rhythm  Ectopy: none     Critical Care performed:  None ____________________________________________   INITIAL IMPRESSION / ASSESSMENT AND PLAN / ED COURSE  87 y.o. female the history of CAD status post CABG, hypertension, hyperlipidemia, chronic kidney disease, CHF, anemia, hypothyroidism who presents accompanied by her daughter, who is patient's healthcare power of attorney, for low blood pressure. Patient was [facility] ago with several changes in her meds including nitro from PRN to daily and amlodipine 2.5mg  to 5mg . She is asymptomatic from it. According to daughter systolic BP has been normal but diastolic was low (in the 45X). Here her BP is stable with MAP greater than 65.  Her EKG shows no signs of ischemic changes.  Her labs show no changes in her kidney function, no electrolyte derangements, mild stable anemia, and no evidence of cardiac ischemia with 2 - high-sensitivity troponins.  We will change her medications back to amlodipine 2.5 mg daily and give authorization to give an extra 2.5 mg dose if patient is persistently hypertensive after an hour.  We will change her nitro from daily to as needed.  Daughter is happy with this plan and that is exactly what she wanted for the patient.       _____________________________________________ Please note:  Patient was evaluated in Emergency Department today for the symptoms described in the history of present illness. Patient was evaluated in the context of the global COVID-19 pandemic, which necessitated consideration that the patient might be at risk for infection with the SARS-CoV-2 virus that causes COVID-19. Institutional protocols and algorithms that pertain to the evaluation of patients at risk for COVID-19 are in a state  of rapid change based on information released by regulatory bodies including the CDC and federal and state organizations. These policies and algorithms were followed during the patient's care in the ED.  Some ED evaluations and interventions may be delayed as a result of limited staffing during the pandemic.   [place] Controlled Substance Database was reviewed by me. ____________________________________________   FINAL CLINICAL IMPRESSION(S) / ED DIAGNOSES   Final diagnoses:  Hypotension due to drugs      NEW MEDICATIONS STARTED DURING THIS VISIT:  ED Discharge Orders         Ordered    amLODipine (NORVASC) 2.5 MG tablet  Daily        06/20/20 0005    nitroGLYCERIN (NITROSTAT) 0.4 MG SL tablet  Once PRN        06/20/20 0006           Note:  This document was prepared using Dragon voice recognition software and may include unintentional dictation errors.    Alfred Levins, [place], MD 06/20/20 [phone]

## 2020-06-20 DIAGNOSIS — I952 Hypotension due to drugs: Secondary | ICD-10-CM | POA: Diagnosis not present

## 2020-06-20 LAB — TROPONIN I (HIGH SENSITIVITY): Troponin I (High Sensitivity): 15 ng/L (ref <18)

## 2020-06-20 MED ORDER — LACTATED RINGERS IV BOLUS
500.0000 mL | Freq: Once | INTRAVENOUS | Status: AC
  Administered 2020-06-20: 500 mL via INTRAVENOUS

## 2020-06-20 MED ORDER — NITROGLYCERIN 0.4 MG SL SUBL: 0.4000 mg | SUBLINGUAL_TABLET | Freq: Once | SUBLINGUAL | 3 refills | Status: DC | PRN

## 2020-06-20 MED ORDER — AMLODIPINE BESYLATE 2.5 MG PO TABS: 2.5000 mg | ORAL_TABLET | Freq: Every day | ORAL | Status: DC

## 2020-06-20 NOTE — Discharge Instructions (Addendum)
Please make these changes to patient's medications:  Nitroglycerine should be 1 under the tongue as needed for chest pain. Do not give it daily. Amlodipine 2.5mg  once a day. It is okay to give a second 2.5mg  dose if patient is persistently hypertensive (BP>= 160/90) after 1hr of receiving the first dose.

## 2020-08-18 ENCOUNTER — Emergency Department: Payer: Medicare Other

## 2020-08-18 ENCOUNTER — Emergency Department
Admission: EM | Admit: 2020-08-18 | Discharge: 2020-08-18 | Disposition: A | Discharge status: Home / Self Care | Payer: Medicare Other | Attending: Emergency Medicine | Admitting: Emergency Medicine

## 2020-08-18 DIAGNOSIS — Z7982 Long term (current) use of aspirin: Secondary | ICD-10-CM | POA: Diagnosis not present

## 2020-08-18 DIAGNOSIS — Z85828 Personal history of other malignant neoplasm of skin: Secondary | ICD-10-CM | POA: Insufficient documentation

## 2020-08-18 DIAGNOSIS — W19XXXA Unspecified fall, initial encounter: Secondary | ICD-10-CM | POA: Insufficient documentation

## 2020-08-18 DIAGNOSIS — Z96653 Presence of artificial knee joint, bilateral: Secondary | ICD-10-CM | POA: Diagnosis not present

## 2020-08-18 DIAGNOSIS — N183 Chronic kidney disease, stage 3 unspecified: Secondary | ICD-10-CM | POA: Insufficient documentation

## 2020-08-18 DIAGNOSIS — S0990XA Unspecified injury of head, initial encounter: Secondary | ICD-10-CM

## 2020-08-18 DIAGNOSIS — S0003XA Contusion of scalp, initial encounter: Secondary | ICD-10-CM | POA: Insufficient documentation

## 2020-08-18 DIAGNOSIS — I509 Heart failure, unspecified: Secondary | ICD-10-CM | POA: Diagnosis not present

## 2020-08-18 DIAGNOSIS — I130 Hypertensive heart and chronic kidney disease with heart failure and stage 1 through stage 4 chronic kidney disease, or unspecified chronic kidney disease: Secondary | ICD-10-CM | POA: Insufficient documentation

## 2020-08-18 DIAGNOSIS — M542 Cervicalgia: Secondary | ICD-10-CM | POA: Diagnosis not present

## 2020-08-18 DIAGNOSIS — Z79899 Other long term (current) drug therapy: Secondary | ICD-10-CM | POA: Diagnosis not present

## 2020-08-18 DIAGNOSIS — M25512 Pain in left shoulder: Secondary | ICD-10-CM | POA: Insufficient documentation

## 2020-08-18 LAB — CBC
HCT: 33.4 % — ABNORMAL LOW (ref 36.0–46.0)
Hemoglobin: 11.2 g/dL — ABNORMAL LOW (ref 12.0–15.0)
MCH: 32.5 pg (ref 26.0–34.0)
MCHC: 33.5 g/dL (ref 30.0–36.0)
MCV: 96.8 fL (ref 80.0–100.0)
Platelets: 256 10*3/uL (ref 150–400)
RBC: 3.45 MIL/uL — ABNORMAL LOW (ref 3.87–5.11)
RDW: 13 % (ref 11.5–15.5)
WBC: 4.2 10*3/uL (ref 4.0–10.5)
nRBC: 0 % (ref 0.0–0.2)

## 2020-08-18 LAB — BASIC METABOLIC PANEL
Anion gap: 8 (ref 5–15)
BUN: 23 mg/dL (ref 8–23)
CO2: 22 mmol/L (ref 22–32)
Calcium: 8.7 mg/dL — ABNORMAL LOW (ref 8.9–10.3)
Chloride: 107 mmol/L (ref 98–111)
Creatinine, Ser: 1.39 mg/dL — ABNORMAL HIGH (ref 0.44–1.00)
GFR, Estimated: 36 mL/min — ABNORMAL LOW (ref >60)
Glucose, Bld: 84 mg/dL (ref 70–99)
Potassium: 3.6 mmol/L (ref 3.5–5.1)
Sodium: 137 mmol/L (ref 135–145)

## 2020-08-18 NOTE — ED Notes (Signed)
IV removed from patients left arm. Provided with beverages. Patient sitting up in wheelchair at this time. Daughter with patient. Patient awaiting EMS transport back to [place].

## 2020-08-18 NOTE — ED Provider Notes (Signed)
[facility] Emergency Department Provider Note   ____________________________________________    I have reviewed the triage vital signs and the nursing notes.   HISTORY  Chief Complaint Fall  History limited by dementia   HPI Monique Burns is a 87 y.o. female with history as noted below who presents after an unwitnessed fall.  She describes mild head and neck pain.  Mild left shoulder pain at the site of prior injury there.  Daughter notes the patient is very unsteady and was unable to walk unassisted.  Does have a history of falls.  Patient denies abdominal pain, no pelvic or back pain reported.  No hip pain.  Not on blood thinners.  Past Medical History:  Diagnosis Date  . Anemia   . Arthritis   . Blood dyscrasia    since being put on Brilinta after stent placement developing hematoma  . Cancer ([place])    basal cell left leg  . CHF (congestive heart failure) ([place])   . GERD (gastroesophageal reflux disease)   . Headache    h/o migraines  . Hyperlipidemia   . Hypertension   . Leukopenia   . MI (myocardial infarction) ([place]) 02/2015   s/p 4 stents and CABG in 2017  . Renal disorder    stage 3-Dr Caryl Comes keeping a check on kidney function  . Shortness of breath dyspnea    mild compared to prior stenting-pt states she would have to stop and rest if walking a mile  . Thyroid disease     Patient Active Problem List   Diagnosis Date Noted  . Debility 06/16/2020  . Left hip pain   . Lobar pneumonia ([place])   . Acute metabolic encephalopathy   . Falls frequently   . Elevated d-dimer   . Anemia   . CAP (community acquired pneumonia) 05/28/2020  . Overactive bladder 04/08/2020  . Urge incontinence 04/08/2020  . Nocturia 04/08/2020  . Unstable angina ([place]) 12/20/2017  . Protein malnutrition ([place]) 11/27/2015  . Chest pain 11/26/2015  . Chronic kidney disease 04/05/2015  . HLD (hyperlipidemia) 04/05/2015  . Essential hypertension 04/05/2015  .  Decreased leukocytes 04/05/2015  . Arthritis, degenerative 04/05/2015  . Disease of thyroid gland 04/05/2015  . Acute myocardial infarction of anterior wall ([place]) 02/28/2015  . Degenerative arthritis of hip 10/31/2013    Past Surgical History:  Procedure Laterality Date  . ABDOMINAL HYSTERECTOMY    . CARDIAC CATHETERIZATION N/A 12/08/2015   Procedure: Left Heart Cath and Coronary Angiography;  Surgeon: Yolonda Kida, MD;  Location: [place] [facility];  Service: Cardiovascular;  Laterality: N/A;  . CORONARY ANGIOPLASTY WITH STENT PLACEMENT    . CORONARY STENT PLACEMENT  2016   x2  . EYE SURGERY Bilateral 2011  . INCISION AND DRAINAGE Left 08/26/2015   Procedure: INCISION AND DRAINAGE / HAND HEMATOMA;  Surgeon: Hessie Knows, MD;  Location: [facility] ORS;  Service: Orthopedics;  Laterality: Left;  . JOINT REPLACEMENT Bilateral    left 2016 and right 2015  . TONSILLECTOMY     3rd grade    Prior to Admission medications   Medication Sig Start Date End Date Taking? Authorizing Provider  acetaminophen (TYLENOL) 500 MG tablet Take 500-1,000 mg by mouth every 6 (six) hours as needed for pain.    [provider]  amLODipine (NORVASC) 2.5 MG tablet Take 1 tablet (2.5 mg total) by mouth daily. 06/20/20   Rudene Re, MD  aspirin EC 81 MG tablet Take 81 mg  by mouth daily.    [provider]  atorvastatin (LIPITOR) 80 MG tablet Take 80 mg by mouth every evening.     [provider]  cycloSPORINE (RESTASIS) 0.05 % ophthalmic emulsion Place 1 drop into both eyes 2 (two) times daily.     [provider]  DULoxetine (CYMBALTA) 30 MG capsule Take 30 mg by mouth every evening.    [provider]  ferrous sulfate 325 (65 FE) MG tablet Take 1 tablet (325 mg total) by mouth daily with breakfast. 06/02/20   Nolberto Hanlon, MD  lamoTRIgine (LAMICTAL) 25 MG tablet Take 25 mg by mouth every evening.    [provider]  lidocaine (LIDODERM) 5 % Place 1  patch onto the skin daily. Remove & Discard patch within 12 hours or as directed by MD 06/01/20   Nolberto Hanlon, MD  metoprolol succinate (TOPROL-XL) 25 MG 24 hr tablet Take 25 mg by mouth every evening. 05/03/20   [provider]  Multiple Vitamin (MULTIVITAMIN ADULT PO) Take 1 tablet by mouth daily.    [provider]  nitroGLYCERIN (NITROSTAT) 0.4 MG SL tablet Place 1 tablet (0.4 mg total) under the tongue once as needed for up to 1 dose for chest pain. 06/20/20   Rudene Re, MD  ranolazine (RANEXA) 1000 MG SR tablet Take 1,000 mg by mouth 2 (two) times daily. 11/30/17   [provider]  Vibegron (GEMTESA) 75 MG TABS Take 75 mg by mouth daily. 05/10/20   Debroah Loop, PA-C     Allergies Brilinta [ticagrelor] and Imdur [isosorbide nitrate]  Family History  Problem Relation Age of Onset  . Breast cancer Paternal Grandmother   . Brain cancer Mother   . Stroke Father   . Brain cancer Son     Social History Social History   Tobacco Use  . Smoking status: Never Smoker  . Smokeless tobacco: Never Used  Vaping Use  . Vaping Use: Never used  Substance Use Topics  . Alcohol use: No  . Drug use: No    Review of Systems  Constitutional: Denies dizziness Eyes: No visual changes.  ENT: No nasal injury Cardiovascular: Denies chest pain. Respiratory: Denies shortness of breath. Gastrointestinal: No abdominal pain. Genitourinary: Negative for dysuria. Musculoskeletal: As above Skin: Negative for laceration Neurological: Negative for focal weakness   ____________________________________________   PHYSICAL EXAM:  VITAL SIGNS: ED Triage Vitals  Enc Vitals Group     BP 08/18/20 1110 (!) 145/50     Pulse Rate 08/18/20 1110 (!) 54     Resp 08/18/20 1110 18     Temp 08/18/20 1110 98.1 F (36.7 C)     Temp Source 08/18/20 1110 Oral     SpO2 08/18/20 1110 98 %     Weight 08/18/20 1111 52 kg (114 lb 10.2 oz)     Height 08/18/20 1111 1.702 m  (5\' 7" )     Head Circumference --      Peak Flow --      Pain Score --      Pain Loc --      Pain Edu? --      Excl. in [place]? --     Constitutional: Alert and oriented. e Eyes: Conjunctivae are normal.  Head: Small hematoma posterior scalp Nose: No congestion/rhinnorhea. Mouth/Throat: Mucous membranes are moist.   Neck: Mild vertebral tenderness to palpation, no pain with axial load Cardiovascular: Normal rate, regular rhythm. Grossly normal heart sounds.  Good peripheral circulation. Respiratory: Normal respiratory  effort.  No retractions. Lungs CTAB. Gastrointestinal: Soft and nontender. No distention.  No CVA tenderness.  Musculoskeletal: Back: No vertebral tenderness to palpation, no pain with axial load on both hips.  Mild discomfort with range of motion of the left arm.  No bony normalities palpated, warm and well perfused. Neurologic:  Normal speech and language. No gross focal neurologic deficits are appreciated.  Skin:  Skin is warm, dry and intact. No rash noted. Psychiatric: Mood and affect are normal. Speech and behavior are normal.  ____________________________________________   LABS (all labs ordered are listed, but only abnormal results are displayed)  Labs Reviewed  BASIC METABOLIC PANEL - Abnormal; Notable for the following components:      Result Value   Creatinine, Ser 1.39 (*)    Calcium 8.7 (*)    GFR, Estimated 36 (*)    All other components within normal limits  CBC - Abnormal; Notable for the following components:   RBC 3.45 (*)    Hemoglobin 11.2 (*)    HCT 33.4 (*)    All other components within normal limits   ____________________________________________  EKG  ED ECG REPORT I, Lavonia Drafts, the attending physician, personally viewed and interpreted this ECG.  Date: 08/18/2020  Rhythm: normal sinus rhythm QRS Axis: normal Intervals: normal ST/T Wave abnormalities: normal Narrative Interpretation: no evidence of acute  ischemia  ____________________________________________  RADIOLOGY  CT head, cervical spine X-ray shoulder ____________________________________________   PROCEDURES  Procedure(s) performed: No  Procedures   Critical Care performed: No ____________________________________________   INITIAL IMPRESSION / ASSESSMENT AND PLAN / ED COURSE  Pertinent labs & imaging results that were available during my care of the patient were reviewed by me and considered in my medical decision making (see chart for details).  Patient presents after unwitnessed falls, history of frequent falls.  No neuro deficits, exam is overall reassuring, mild neck discomfort and posterior head injury.  Some discomfort with range of motion of the left arm while this appears chronic.  Will obtain imaging.  Basic labs to evaluate kidney function, hemoglobin.  EKG is reassuring.  CT head and cervical spine are unremarkable  Left shoulder x-ray reviewed by me, no acute injury  Lab work unchanged from prior.  Appropriate for discharge at this time, discussed with daughter    ____________________________________________   FINAL CLINICAL IMPRESSION(S) / ED DIAGNOSES  Final diagnoses:  Fall, initial encounter  Injury of head, initial encounter        Note:  This document was prepared using Dragon voice recognition software and may include unintentional dictation errors.   Lavonia Drafts, MD 08/18/20 1438

## 2020-08-18 NOTE — ED Triage Notes (Signed)
Patient presents to ER from [place]. Patient coming to ER for unwitnessed fall. Complaining of neck and head pain. Intermittent nausea since fall. Walks with walker at baseline. Patient A&OX1 at baseline.  BGL 147 18 G LAC

## 2020-08-25 ENCOUNTER — Other Ambulatory Visit: Payer: Self-pay

## 2020-08-25 ENCOUNTER — Emergency Department
Admission: EM | Admit: 2020-08-25 | Discharge: 2020-08-25 | Disposition: A | Discharge status: Home / Self Care | Payer: Medicare Other | Attending: Emergency Medicine | Admitting: Emergency Medicine

## 2020-08-25 ENCOUNTER — Emergency Department: Payer: Medicare Other

## 2020-08-25 DIAGNOSIS — F0390 Unspecified dementia without behavioral disturbance: Secondary | ICD-10-CM | POA: Diagnosis not present

## 2020-08-25 DIAGNOSIS — R4182 Altered mental status, unspecified: Secondary | ICD-10-CM | POA: Insufficient documentation

## 2020-08-25 DIAGNOSIS — I130 Hypertensive heart and chronic kidney disease with heart failure and stage 1 through stage 4 chronic kidney disease, or unspecified chronic kidney disease: Secondary | ICD-10-CM | POA: Diagnosis not present

## 2020-08-25 DIAGNOSIS — Z79899 Other long term (current) drug therapy: Secondary | ICD-10-CM | POA: Diagnosis not present

## 2020-08-25 DIAGNOSIS — Z955 Presence of coronary angioplasty implant and graft: Secondary | ICD-10-CM | POA: Diagnosis not present

## 2020-08-25 DIAGNOSIS — I509 Heart failure, unspecified: Secondary | ICD-10-CM | POA: Insufficient documentation

## 2020-08-25 DIAGNOSIS — R079 Chest pain, unspecified: Secondary | ICD-10-CM | POA: Insufficient documentation

## 2020-08-25 DIAGNOSIS — R519 Headache, unspecified: Secondary | ICD-10-CM | POA: Diagnosis not present

## 2020-08-25 DIAGNOSIS — R4689 Other symptoms and signs involving appearance and behavior: Secondary | ICD-10-CM

## 2020-08-25 DIAGNOSIS — N183 Chronic kidney disease, stage 3 unspecified: Secondary | ICD-10-CM | POA: Diagnosis not present

## 2020-08-25 LAB — CBC WITH DIFFERENTIAL/PLATELET
Abs Immature Granulocytes: 0.02 10*3/uL (ref 0.00–0.07)
Basophils Absolute: 0 10*3/uL (ref 0.0–0.1)
Basophils Relative: 1 %
Eosinophils Absolute: 0 10*3/uL (ref 0.0–0.5)
Eosinophils Relative: 0 %
HCT: 31.7 % — ABNORMAL LOW (ref 36.0–46.0)
Hemoglobin: 11 g/dL — ABNORMAL LOW (ref 12.0–15.0)
Immature Granulocytes: 0 %
Lymphocytes Relative: 24 %
Lymphs Abs: 1.4 10*3/uL (ref 0.7–4.0)
MCH: 32.7 pg (ref 26.0–34.0)
MCHC: 34.7 g/dL (ref 30.0–36.0)
MCV: 94.3 fL (ref 80.0–100.0)
Monocytes Absolute: 0.8 10*3/uL (ref 0.1–1.0)
Monocytes Relative: 15 %
Neutro Abs: 3.4 10*3/uL (ref 1.7–7.7)
Neutrophils Relative %: 60 %
Platelets: 266 10*3/uL (ref 150–400)
RBC: 3.36 MIL/uL — ABNORMAL LOW (ref 3.87–5.11)
RDW: 12.9 % (ref 11.5–15.5)
WBC: 5.6 10*3/uL (ref 4.0–10.5)
nRBC: 0 % (ref 0.0–0.2)

## 2020-08-25 LAB — COMPREHENSIVE METABOLIC PANEL
ALT: 19 U/L (ref 0–44)
AST: 33 U/L (ref 15–41)
Albumin: 3.7 g/dL (ref 3.5–5.0)
Alkaline Phosphatase: 60 U/L (ref 38–126)
Anion gap: 10 (ref 5–15)
BUN: 35 mg/dL — ABNORMAL HIGH (ref 8–23)
CO2: 22 mmol/L (ref 22–32)
Calcium: 9.3 mg/dL (ref 8.9–10.3)
Chloride: 102 mmol/L (ref 98–111)
Creatinine, Ser: 1.63 mg/dL — ABNORMAL HIGH (ref 0.44–1.00)
GFR, Estimated: 30 mL/min — ABNORMAL LOW (ref >60)
Glucose, Bld: 124 mg/dL — ABNORMAL HIGH (ref 70–99)
Potassium: 4 mmol/L (ref 3.5–5.1)
Sodium: 134 mmol/L — ABNORMAL LOW (ref 135–145)
Total Bilirubin: 1 mg/dL (ref 0.3–1.2)
Total Protein: 6.6 g/dL (ref 6.5–8.1)

## 2020-08-25 LAB — URINALYSIS, COMPLETE (UACMP) WITH MICROSCOPIC
Bacteria, UA: NONE SEEN
Bilirubin Urine: NEGATIVE
Glucose, UA: NEGATIVE mg/dL
Hgb urine dipstick: NEGATIVE
Ketones, ur: 5 mg/dL — AB
Leukocytes,Ua: NEGATIVE
Nitrite: NEGATIVE
Protein, ur: NEGATIVE mg/dL
Specific Gravity, Urine: 1.019 (ref 1.005–1.030)
pH: 5 (ref 5.0–8.0)

## 2020-08-25 LAB — PROTIME-INR
INR: 1 (ref 0.8–1.2)
Prothrombin Time: 13.3 seconds (ref 11.4–15.2)

## 2020-08-25 LAB — LACTIC ACID, PLASMA: Lactic Acid, Venous: 0.9 mmol/L (ref 0.5–1.9)

## 2020-08-25 MED ORDER — LACTATED RINGERS IV BOLUS
1000.0000 mL | Freq: Once | INTRAVENOUS | Status: AC
  Administered 2020-08-25: 1000 mL via INTRAVENOUS

## 2020-08-25 MED ORDER — CETIRIZINE HCL 5 MG/5ML PO SOLN
10.0000 mg | Freq: Once | ORAL | Status: AC
  Administered 2020-08-25: 10 mg via ORAL
  Filled 2020-08-25: qty 10

## 2020-08-25 NOTE — ED Triage Notes (Signed)
Pt to ED from [facility] facility. Staff \\told  EMS that pt is usually walking, talking at baseline but this morning pt was c/o chest pain and HA. Staff gave pt 1 NTG tab SL. Pt has been lethargic and disoriented for EMS. Is oriented to self only.  After NTG tab, pt had BP 90s/50s. CBG was 170. Pt has 18g in L AC.

## 2020-08-25 NOTE — ED Notes (Signed)
Pt to xray

## 2020-08-25 NOTE — ED Notes (Signed)
Zyrtec not available in pyxis. Pharmacy phone busy. Will try to call back again in few min.

## 2020-08-25 NOTE — ED Notes (Signed)
Daughter (who is POA) at bedside. Updated daughter on pt status and plan of care so far.

## 2020-08-25 NOTE — ED Notes (Signed)
Phone call made to pharmacy regarding missing medication at this time.

## 2020-08-25 NOTE — ED Provider Notes (Signed)
[facility] Emergency Department Provider Note  ____________________________________________   I have reviewed the triage vital signs and the nursing notes.   HISTORY  Chief Complaint unresponsive   History limited by and level 5 caveat due to: AMS/Dementia   HPI Monique Burns is a 87 y.o. female who presents to the emergency department today because of concern for change in behavior. Per report the patient is normally awake and alert, ambulatory. Today however staff found her to be more altered than normal and weaker than normal. She was also complaining of chest pain and a headache. Staff gave her a nitroglycerin and then checked her blood pressure and found it to be low. The patient herself is unable to give any significant history.   Records reviewed. Per medical record review patient has a history of CHF, HTN, HLD.   Past Medical History:  Diagnosis Date  . Anemia   . Arthritis   . Blood dyscrasia    since being put on Brilinta after stent placement developing hematoma  . Cancer ([place])    basal cell left leg  . CHF (congestive heart failure) ([place])   . GERD (gastroesophageal reflux disease)   . Headache    h/o migraines  . Hyperlipidemia   . Hypertension   . Leukopenia   . MI (myocardial infarction) ([place]) 02/2015   s/p 4 stents and CABG in 2017  . Renal disorder    stage 3-Dr Caryl Comes keeping a check on kidney function  . Shortness of breath dyspnea    mild compared to prior stenting-pt states she would have to stop and rest if walking a mile  . Thyroid disease     Patient Active Problem List   Diagnosis Date Noted  . Debility 06/16/2020  . Left hip pain   . Lobar pneumonia ([place])   . Acute metabolic encephalopathy   . Falls frequently   . Elevated d-dimer   . Anemia   . CAP (community acquired pneumonia) 05/28/2020  . Overactive bladder 04/08/2020  . Urge incontinence 04/08/2020  . Nocturia 04/08/2020  . Unstable angina ([place])  12/20/2017  . Protein malnutrition ([place]) 11/27/2015  . Chest pain 11/26/2015  . Chronic kidney disease 04/05/2015  . HLD (hyperlipidemia) 04/05/2015  . Essential hypertension 04/05/2015  . Decreased leukocytes 04/05/2015  . Arthritis, degenerative 04/05/2015  . Disease of thyroid gland 04/05/2015  . Acute myocardial infarction of anterior wall ([place]) 02/28/2015  . Degenerative arthritis of hip 10/31/2013    Past Surgical History:  Procedure Laterality Date  . ABDOMINAL HYSTERECTOMY    . CARDIAC CATHETERIZATION N/A 12/08/2015   Procedure: Left Heart Cath and Coronary Angiography;  Surgeon: Yolonda Kida, MD;  Location: [facility];  Service: Cardiovascular;  Laterality: N/A;  . CORONARY ANGIOPLASTY WITH STENT PLACEMENT    . CORONARY STENT PLACEMENT  2016   x2  . EYE SURGERY Bilateral 2011  . INCISION AND DRAINAGE Left 08/26/2015   Procedure: INCISION AND DRAINAGE / HAND HEMATOMA;  Surgeon: Hessie Knows, MD;  Location: [facility] ORS;  Service: Orthopedics;  Laterality: Left;  . JOINT REPLACEMENT Bilateral    left 2016 and right 2015  . TONSILLECTOMY     3rd grade    Prior to Admission medications   Medication Sig Start Date End Date Taking? Authorizing Provider  acetaminophen (TYLENOL) 500 MG tablet Take 500-1,000 mg by mouth every 6 (six) hours as needed for pain.    [provider]  amLODipine (NORVASC) 2.5 MG tablet  Take 1 tablet (2.5 mg total) by mouth daily. 06/20/20   Rudene Re, MD  aspirin EC 81 MG tablet Take 81 mg by mouth daily.    [provider]  atorvastatin (LIPITOR) 80 MG tablet Take 80 mg by mouth every evening.     [provider]  cycloSPORINE (RESTASIS) 0.05 % ophthalmic emulsion Place 1 drop into both eyes 2 (two) times daily.     [provider]  DULoxetine (CYMBALTA) 30 MG capsule Take 30 mg by mouth every evening.    [provider]  ferrous sulfate 325 (65 FE) MG tablet Take 1 tablet (325 mg total) by  mouth daily with breakfast. 06/02/20   Nolberto Hanlon, MD  lamoTRIgine (LAMICTAL) 25 MG tablet Take 25 mg by mouth every evening.    [provider]  lidocaine (LIDODERM) 5 % Place 1 patch onto the skin daily. Remove & Discard patch within 12 hours or as directed by MD 06/01/20   Nolberto Hanlon, MD  metoprolol succinate (TOPROL-XL) 25 MG 24 hr tablet Take 25 mg by mouth every evening. 05/03/20   [provider]  Multiple Vitamin (MULTIVITAMIN ADULT PO) Take 1 tablet by mouth daily.    [provider]  nitroGLYCERIN (NITROSTAT) 0.4 MG SL tablet Place 1 tablet (0.4 mg total) under the tongue once as needed for up to 1 dose for chest pain. 06/20/20   Rudene Re, MD  ranolazine (RANEXA) 1000 MG SR tablet Take 1,000 mg by mouth 2 (two) times daily. 11/30/17   [provider]  Vibegron (GEMTESA) 75 MG TABS Take 75 mg by mouth daily. 05/10/20   Debroah Loop, PA-C    Allergies Brilinta [ticagrelor] and Imdur [isosorbide nitrate]  Family History  Problem Relation Age of Onset  . Breast cancer Paternal Grandmother   . Brain cancer Mother   . Stroke Father   . Brain cancer Son     Social History Social History   Tobacco Use  . Smoking status: Never Smoker  . Smokeless tobacco: Never Used  Vaping Use  . Vaping Use: Never used  Substance Use Topics  . Alcohol use: No  . Drug use: No    Review of Systems Unable to obtain reliable ROS secondary to AMS/dementia.  ____________________________________________   PHYSICAL EXAM:  VITAL SIGNS: ED Triage Vitals  Enc Vitals Group     BP 08/25/20 1204 (!) 109/45     Pulse Rate 08/25/20 1204 60     Resp 08/25/20 1215 17     Temp 08/25/20 1204 98.4 F (36.9 C)     Temp Source 08/25/20 1204 Oral     SpO2 08/25/20 1204 95 %     Weight 08/25/20 1158 105 lb (47.6 kg)     Height 08/25/20 1158 5\' 3"  (1.6 m)   Constitutional: Awake, alert, not oriented.  Eyes: Conjunctivae are normal.  ENT      Head:  Normocephalic and atraumatic.      Nose: No congestion/rhinnorhea.      Mouth/Throat: Mucous membranes are moist.      Neck: No stridor. Hematological/Lymphatic/Immunilogical: No cervical lymphadenopathy. Cardiovascular: Normal rate, regular rhythm.  No murmurs, rubs, or gallops.  Respiratory: Normal respiratory effort without tachypnea nor retractions. Breath sounds are clear and equal bilaterally. No wheezes/rales/rhonchi. Gastrointestinal: Soft and non tender. No rebound. No guarding.  Genitourinary: Deferred Musculoskeletal: Normal range of motion in all extremities. No lower extremity edema. Neurologic:  Awake, alert. Not oriented. Moving all extremities.  Skin:  Skin  is warm, dry and intact. No rash noted. ____________________________________________    LABS (pertinent positives/negatives)  CMP na 134, k 4.0, cr 1.63 CBC wbc 5.6, hgb 11.0, plt 266 Lactic 0.9  ____________________________________________   EKG  I, Nance Pear, attending physician, personally viewed and interpreted this EKG  EKG Time: 1200 Rate: 62 Rhythm: normal sinus rhythm Axis: left axis deviation Intervals: qtc normal QRS: narrow, q waves v1, v2 ST changes: no st elevation Impression: abnormal ekg  ____________________________________________    RADIOLOGY  CXR Prior CABG. No acute abnormality ____________________________________________   PROCEDURES  Procedures  ____________________________________________   INITIAL IMPRESSION / ASSESSMENT AND PLAN / ED COURSE  Pertinent labs & imaging results that were available during my care of the patient were reviewed by me and considered in my medical decision making (see chart for details).   Patient presented to the emergency department today because of concerns for change in behavior.  More history was obtained from daughter when she arrived to the emergency department.  It sounds like the patient has not been doing well over the past 3  days.  Blood work here without any significant leukocytosis electrolyte abnormality, anemia or signs of dehydration.  Awaiting urine at time of signout.  Patient did seem to start improving with IV fluids.  ____________________________________________   FINAL CLINICAL IMPRESSION(S) / ED DIAGNOSES  Final diagnoses:  Altered behavior     Note: This dictation was prepared with Dragon dictation. Any transcriptional errors that result from this process are unintentional     Nance Pear, MD 08/25/20 1510

## 2020-08-25 NOTE — ED Notes (Signed)
Peri care performed. Gown removed--was soiled with stool. Hospital gown placed on pt. Orange juice and [place] sandwich provided. Will allow pt to sip on juice then take bites once daughter returns to room.

## 2020-08-25 NOTE — ED Provider Notes (Signed)
  Patient received in signout from Dr. Archie Balboa pending urinalysis and reevaluation.  Briefly, patient presents with decreased mentation nonspecifically with a nonfocal exam.  Provided IV fluids empirically and patient subsequently perks up back to baseline, per daughter at bedside.  I reevaluate the patient and she continues to have a nonfocal exam and is pleasantly demented.  Normal vital signs.  Daughter at the bedside indicates that she seems much better now.  Urinalysis from catheter specimen without infectious features.  No signs of acute cystitis contributing to her previous altered mentation.  I discussed rather benign work-up with daughter at the bedside, and she is agreeable with plan of care to return to nursing facility with conservative measures and return precautions for the ED.  We discussed return precautions and patient stable for outpatient management.   Vladimir Crofts, MD 08/25/20 1901

## 2020-08-30 LAB — CULTURE, BLOOD (ROUTINE X 2)
Culture: NO GROWTH
Culture: NO GROWTH
Special Requests: ADEQUATE

## 2020-10-13 ENCOUNTER — Other Ambulatory Visit: Payer: Self-pay | Admitting: Internal Medicine

## 2020-10-13 ENCOUNTER — Other Ambulatory Visit (HOSPITAL_COMMUNITY): Payer: Self-pay | Admitting: Internal Medicine

## 2020-10-13 DIAGNOSIS — R1084 Generalized abdominal pain: Secondary | ICD-10-CM

## 2020-10-13 DIAGNOSIS — T184XXA Foreign body in colon, initial encounter: Secondary | ICD-10-CM

## 2020-10-13 DIAGNOSIS — R197 Diarrhea, unspecified: Secondary | ICD-10-CM

## 2020-10-26 ENCOUNTER — Ambulatory Visit
Admission: RE | Admit: 2020-10-26 | Discharge: 2020-10-26 | Disposition: A | Discharge status: Home / Self Care | Payer: Medicare Other | Source: Ambulatory Visit | Attending: Internal Medicine | Admitting: Internal Medicine

## 2020-10-26 ENCOUNTER — Other Ambulatory Visit: Payer: Self-pay

## 2020-10-26 DIAGNOSIS — R1084 Generalized abdominal pain: Secondary | ICD-10-CM

## 2020-10-26 DIAGNOSIS — T184XXA Foreign body in colon, initial encounter: Secondary | ICD-10-CM

## 2020-10-26 DIAGNOSIS — R197 Diarrhea, unspecified: Secondary | ICD-10-CM | POA: Diagnosis present

## 2020-12-05 NOTE — Addendum Note (Signed)
Encounter addended by: Annie Paras on: 12/05/2020 12:13 PM  Actions taken: Letter saved

## 2020-12-29 ENCOUNTER — Observation Stay
Admission: EM | Admit: 2020-12-29 | Discharge: 2021-01-01 | Disposition: A | Discharge status: Home / Self Care | Payer: Medicare Other | Attending: Internal Medicine | Admitting: Internal Medicine

## 2020-12-29 ENCOUNTER — Other Ambulatory Visit: Payer: Self-pay

## 2020-12-29 ENCOUNTER — Emergency Department: Payer: Medicare Other

## 2020-12-29 DIAGNOSIS — Z79899 Other long term (current) drug therapy: Secondary | ICD-10-CM | POA: Diagnosis not present

## 2020-12-29 DIAGNOSIS — Z85828 Personal history of other malignant neoplasm of skin: Secondary | ICD-10-CM | POA: Diagnosis not present

## 2020-12-29 DIAGNOSIS — I509 Heart failure, unspecified: Secondary | ICD-10-CM | POA: Diagnosis not present

## 2020-12-29 DIAGNOSIS — N179 Acute kidney failure, unspecified: Secondary | ICD-10-CM | POA: Diagnosis not present

## 2020-12-29 DIAGNOSIS — W19XXXA Unspecified fall, initial encounter: Secondary | ICD-10-CM

## 2020-12-29 DIAGNOSIS — E785 Hyperlipidemia, unspecified: Secondary | ICD-10-CM

## 2020-12-29 DIAGNOSIS — I130 Hypertensive heart and chronic kidney disease with heart failure and stage 1 through stage 4 chronic kidney disease, or unspecified chronic kidney disease: Secondary | ICD-10-CM | POA: Diagnosis not present

## 2020-12-29 DIAGNOSIS — S2242XA Multiple fractures of ribs, left side, initial encounter for closed fracture: Secondary | ICD-10-CM | POA: Diagnosis not present

## 2020-12-29 DIAGNOSIS — I2510 Atherosclerotic heart disease of native coronary artery without angina pectoris: Secondary | ICD-10-CM | POA: Diagnosis not present

## 2020-12-29 DIAGNOSIS — W010XXA Fall on same level from slipping, tripping and stumbling without subsequent striking against object, initial encounter: Secondary | ICD-10-CM | POA: Insufficient documentation

## 2020-12-29 DIAGNOSIS — Z20822 Contact with and (suspected) exposure to covid-19: Secondary | ICD-10-CM | POA: Diagnosis not present

## 2020-12-29 DIAGNOSIS — N1832 Chronic kidney disease, stage 3b: Secondary | ICD-10-CM | POA: Insufficient documentation

## 2020-12-29 DIAGNOSIS — Z96653 Presence of artificial knee joint, bilateral: Secondary | ICD-10-CM | POA: Diagnosis not present

## 2020-12-29 DIAGNOSIS — Z951 Presence of aortocoronary bypass graft: Secondary | ICD-10-CM | POA: Diagnosis not present

## 2020-12-29 DIAGNOSIS — Y92121 Bathroom in nursing home as the place of occurrence of the external cause: Secondary | ICD-10-CM | POA: Insufficient documentation

## 2020-12-29 DIAGNOSIS — E43 Unspecified severe protein-calorie malnutrition: Secondary | ICD-10-CM | POA: Diagnosis not present

## 2020-12-29 DIAGNOSIS — Z7982 Long term (current) use of aspirin: Secondary | ICD-10-CM | POA: Insufficient documentation

## 2020-12-29 DIAGNOSIS — N189 Chronic kidney disease, unspecified: Secondary | ICD-10-CM

## 2020-12-29 DIAGNOSIS — R519 Headache, unspecified: Secondary | ICD-10-CM | POA: Insufficient documentation

## 2020-12-29 DIAGNOSIS — S299XXA Unspecified injury of thorax, initial encounter: Secondary | ICD-10-CM | POA: Diagnosis present

## 2020-12-29 DIAGNOSIS — I10 Essential (primary) hypertension: Secondary | ICD-10-CM

## 2020-12-29 LAB — COMPREHENSIVE METABOLIC PANEL
ALT: 28 U/L (ref 0–44)
AST: 40 U/L (ref 15–41)
Albumin: 4.6 g/dL (ref 3.5–5.0)
Alkaline Phosphatase: 61 U/L (ref 38–126)
Anion gap: 7 (ref 5–15)
BUN: 38 mg/dL — ABNORMAL HIGH (ref 8–23)
CO2: 27 mmol/L (ref 22–32)
Calcium: 9.2 mg/dL (ref 8.9–10.3)
Chloride: 101 mmol/L (ref 98–111)
Creatinine, Ser: 1.99 mg/dL — ABNORMAL HIGH (ref 0.44–1.00)
GFR, Estimated: 24 mL/min — ABNORMAL LOW (ref >60)
Glucose, Bld: 141 mg/dL — ABNORMAL HIGH (ref 70–99)
Potassium: 4.4 mmol/L (ref 3.5–5.1)
Sodium: 135 mmol/L (ref 135–145)
Total Bilirubin: 0.8 mg/dL (ref 0.3–1.2)
Total Protein: 7.8 g/dL (ref 6.5–8.1)

## 2020-12-29 LAB — RESP PANEL BY RT-PCR (FLU A&B, COVID) ARPGX2
Influenza A by PCR: NEGATIVE
Influenza B by PCR: NEGATIVE
SARS Coronavirus 2 by RT PCR: NEGATIVE

## 2020-12-29 LAB — CBC
HCT: 33 % — ABNORMAL LOW (ref 36.0–46.0)
Hemoglobin: 11.3 g/dL — ABNORMAL LOW (ref 12.0–15.0)
MCH: 34.8 pg — ABNORMAL HIGH (ref 26.0–34.0)
MCHC: 34.2 g/dL (ref 30.0–36.0)
MCV: 101.5 fL — ABNORMAL HIGH (ref 80.0–100.0)
Platelets: 288 10*3/uL (ref 150–400)
RBC: 3.25 MIL/uL — ABNORMAL LOW (ref 3.87–5.11)
RDW: 12.6 % (ref 11.5–15.5)
WBC: 5.3 10*3/uL (ref 4.0–10.5)
nRBC: 0 % (ref 0.0–0.2)

## 2020-12-29 LAB — SAMPLE TO BLOOD BANK

## 2020-12-29 MED ORDER — MORPHINE SULFATE (PF) 2 MG/ML IV SOLN: 1.0000 mg | INTRAVENOUS | Status: DC | PRN

## 2020-12-29 MED ORDER — TRAZODONE HCL 50 MG PO TABS
25.0000 mg | ORAL_TABLET | Freq: Every evening | ORAL | Status: DC | PRN
  Administered 2020-12-30 – 2020-12-31 (×3): 25 mg via ORAL
  Filled 2020-12-29 (×3): qty 1

## 2020-12-29 MED ORDER — ACETAMINOPHEN 650 MG RE SUPP: 650.0000 mg | Freq: Four times a day (QID) | RECTAL | Status: DC | PRN

## 2020-12-29 MED ORDER — ONDANSETRON HCL 4 MG PO TABS: 4.0000 mg | ORAL_TABLET | Freq: Four times a day (QID) | ORAL | Status: DC | PRN

## 2020-12-29 MED ORDER — ONDANSETRON HCL 4 MG/2ML IJ SOLN: 4.0000 mg | Freq: Four times a day (QID) | INTRAMUSCULAR | Status: DC | PRN

## 2020-12-29 MED ORDER — ENOXAPARIN SODIUM 30 MG/0.3ML IJ SOSY
30.0000 mg | PREFILLED_SYRINGE | INTRAMUSCULAR | Status: DC
  Administered 2020-12-30 – 2021-01-01 (×3): 30 mg via SUBCUTANEOUS
  Filled 2020-12-29 (×3): qty 0.3

## 2020-12-29 MED ORDER — SODIUM CHLORIDE 0.9 % IV SOLN: INTRAVENOUS | Status: DC

## 2020-12-29 MED ORDER — TRAMADOL HCL 50 MG PO TABS
50.0000 mg | ORAL_TABLET | Freq: Once | ORAL | Status: AC
  Administered 2020-12-29: 50 mg via ORAL
  Filled 2020-12-29: qty 1

## 2020-12-29 MED ORDER — OXYCODONE HCL 5 MG PO TABS
5.0000 mg | ORAL_TABLET | ORAL | Status: DC | PRN
  Administered 2020-12-30 – 2020-12-31 (×5): 5 mg via ORAL
  Filled 2020-12-29 (×5): qty 1

## 2020-12-29 MED ORDER — FENTANYL CITRATE PF 50 MCG/ML IJ SOSY
50.0000 ug | PREFILLED_SYRINGE | Freq: Once | INTRAMUSCULAR | Status: AC
  Administered 2020-12-29: 50 ug via INTRAVENOUS
  Filled 2020-12-29: qty 1

## 2020-12-29 MED ORDER — MAGNESIUM HYDROXIDE 400 MG/5ML PO SUSP
30.0000 mL | Freq: Every day | ORAL | Status: DC | PRN
  Administered 2020-12-31: 30 mL via ORAL
  Filled 2020-12-29 (×2): qty 30

## 2020-12-29 MED ORDER — ACETAMINOPHEN 325 MG PO TABS
650.0000 mg | ORAL_TABLET | Freq: Four times a day (QID) | ORAL | Status: DC | PRN
  Administered 2020-12-31: 650 mg via ORAL
  Filled 2020-12-29: qty 2

## 2020-12-29 NOTE — Progress Notes (Signed)
PHARMACIST - PHYSICIAN COMMUNICATION  CONCERNING:  Enoxaparin (Lovenox) for DVT Prophylaxis    RECOMMENDATION: Patient was prescribed enoxaprin 40mg  q24 hours for VTE prophylaxis.   Filed Weights   12/29/20 1922  Weight: 52.2 kg (115 lb)    Body mass index is 18.01 kg/m.  Estimated Creatinine Clearance: 16.1 mL/min (A) (by C-G formula based on SCr of 1.99 mg/dL (H)).  Patient is candidate for enoxaparin 30mg  every 24 hours based on CrCl <90ml/min or Weight <45kg  DESCRIPTION: Pharmacy has adjusted enoxaparin dose per [facility] policy.  Patient is now receiving enoxaparin 30 mg every 24 hours   Renda Rolls, PharmD, [facility] 12/29/2020 11:42 PM

## 2020-12-29 NOTE — ED Provider Notes (Signed)
[facility] Emergency Department Provider Note  Time seen: 7:48 PM  I have reviewed the triage vital signs and the nursing notes.   HISTORY  Chief Complaint Fall    HPI Monique Burns is a 87 y.o. female with a past medical history of anemia, CHF, gastric reflux, hypertension, hyperlipidemia, CKD, presents to the emergency department for a fall.  According to report patient is coming from a nursing facility where she had a fall tonight landing on her left side.  Patient is complaining of pain to her left hip and left chest.  Does not believe she hit her head.  Denies LOC.  No anticoagulation.   Past Medical History:  Diagnosis Date   Anemia    Arthritis    Blood dyscrasia    since being put on Brilinta after stent placement developing hematoma   Cancer ([place])    basal cell left leg   CHF (congestive heart failure) ([facility])    GERD (gastroesophageal reflux disease)    Headache    h/o migraines   Hyperlipidemia    Hypertension    Leukopenia    MI (myocardial infarction) ([place]) 02/2015   s/p 4 stents and CABG in 2017   Renal disorder    stage 3-Dr Caryl Comes keeping a check on kidney function   Shortness of breath dyspnea    mild compared to prior stenting-pt states she would have to stop and rest if walking a mile   Thyroid disease     Patient Active Problem List   Diagnosis Date Noted   Debility 06/16/2020   Left hip pain    Lobar pneumonia ([place])    Acute metabolic encephalopathy    Falls frequently    Elevated d-dimer    Anemia    CAP (community acquired pneumonia) 05/28/2020   Overactive bladder 04/08/2020   Urge incontinence 04/08/2020   Nocturia 04/08/2020   Unstable angina ([place]) 12/20/2017   Protein malnutrition ([place]) 11/27/2015   Chest pain 11/26/2015   Chronic kidney disease 04/05/2015   HLD (hyperlipidemia) 04/05/2015   Essential hypertension 04/05/2015   Decreased leukocytes 04/05/2015   Arthritis, degenerative 04/05/2015   Disease  of thyroid gland 04/05/2015   Acute myocardial infarction of anterior wall ([place]) 02/28/2015   Degenerative arthritis of hip 10/31/2013    Past Surgical History:  Procedure Laterality Date   ABDOMINAL HYSTERECTOMY     CARDIAC CATHETERIZATION N/A 12/08/2015   Procedure: Left Heart Cath and Coronary Angiography;  Surgeon: Yolonda Kida, MD;  Location: [facility];  Service: Cardiovascular;  Laterality: N/A;   CORONARY ANGIOPLASTY WITH STENT PLACEMENT     CORONARY STENT PLACEMENT  2016   x2   EYE SURGERY Bilateral 2011   INCISION AND DRAINAGE Left 08/26/2015   Procedure: INCISION AND DRAINAGE / HAND HEMATOMA;  Surgeon: Hessie Knows, MD;  Location: [facility] ORS;  Service: Orthopedics;  Laterality: Left;   JOINT REPLACEMENT Bilateral    left 2016 and right 2015   TONSILLECTOMY     3rd grade    Prior to Admission medications   Medication Sig Start Date End Date Taking? Authorizing Provider  acetaminophen (TYLENOL) 500 MG tablet Take 500-1,000 mg by mouth every 6 (six) hours as needed for pain.    [provider]  amLODipine (NORVASC) 2.5 MG tablet Take 1 tablet (2.5 mg total) by mouth daily. 06/20/20   Rudene Re, MD  aspirin EC 81 MG tablet Take 81 mg by mouth daily.    [provider]  atorvastatin (LIPITOR) 80 MG tablet Take 80 mg by mouth every evening.     [provider]  cycloSPORINE (RESTASIS) 0.05 % ophthalmic emulsion Place 1 drop into both eyes 2 (two) times daily.     [provider]  DULoxetine (CYMBALTA) 30 MG capsule Take 30 mg by mouth every evening.    [provider]  ferrous sulfate 325 (65 FE) MG tablet Take 1 tablet (325 mg total) by mouth daily with breakfast. 06/02/20   Nolberto Hanlon, MD  lamoTRIgine (LAMICTAL) 25 MG tablet Take 25 mg by mouth every evening.    [provider]  lidocaine (LIDODERM) 5 % Place 1 patch onto the skin daily. Remove & Discard patch within 12 hours or as directed by MD 06/01/20    Nolberto Hanlon, MD  metoprolol succinate (TOPROL-XL) 25 MG 24 hr tablet Take 25 mg by mouth every evening. 05/03/20   [provider]  Multiple Vitamin (MULTIVITAMIN ADULT PO) Take 1 tablet by mouth daily.    [provider]  nitroGLYCERIN (NITROSTAT) 0.4 MG SL tablet Place 1 tablet (0.4 mg total) under the tongue once as needed for up to 1 dose for chest pain. 06/20/20   Rudene Re, MD  ondansetron [facility]) 4 MG tablet  08/05/20   [provider]  polyethylene glycol powder (GLYCOLAX/MIRALAX) 17 GM/SCOOP powder Take by mouth.    [provider]  ranolazine (RANEXA) 1000 MG SR tablet Take 1,000 mg by mouth 2 (two) times daily. 11/30/17   [provider]  Vibegron (GEMTESA) 75 MG TABS Take 75 mg by mouth daily. 05/10/20   Debroah Loop, PA-C    Allergies  Allergen Reactions   Brilinta [Ticagrelor] Other (See Comments)    "caused stent to fail"   Imdur [Isosorbide Nitrate] Other (See Comments)    Headache    Family History  Problem Relation Age of Onset   Breast cancer Paternal Grandmother    Brain cancer Mother    Stroke Father    Brain cancer Son     Social History Social History   Tobacco Use   Smoking status: Never   Smokeless tobacco: Never  Vaping Use   Vaping Use: Never used  Substance Use Topics   Alcohol use: No   Drug use: No    Review of Systems Constitutional: Negative for loss of consciousness. Cardiovascular: Negative for chest pain. Respiratory: Negative for shortness of breath. Gastrointestinal: Negative for abdominal pain Musculoskeletal: Moderate left hip pain.  Moderate left chest wall pain. Skin: Negative for skin complaints  Neurological: Negative for headache All other ROS negative  ____________________________________________   PHYSICAL EXAM:  VITAL SIGNS: ED Triage Vitals  Enc Vitals Group     BP 12/29/20 1924 (!) 180/58     Pulse Rate 12/29/20 1924 70     Resp 12/29/20 1924 18      Temp 12/29/20 1924 97.9 F (36.6 C)     Temp Source 12/29/20 1924 Oral     SpO2 12/29/20 1924 99 %     Weight 12/29/20 1922 115 lb (52.2 kg)     Height 12/29/20 1922 5\' 7"  (1.702 m)     Head Circumference --      Peak Flow --      Pain Score 12/29/20 1921 10     Pain Loc --      Pain Edu? --      Excl. in [place]? --    Constitutional: Alert and oriented. Well appearing  and in no distress. Eyes: Normal exam ENT      Head: Normocephalic and atraumatic.      Mouth/Throat: Mucous membranes are moist. Cardiovascular: Normal rate, regular rhythm.  Respiratory: Normal respiratory effort without tachypnea nor retractions. Breath sounds are clear  Gastrointestinal: Soft and nontender. No distention. Musculoskeletal: Mild C-spine tenderness to palpation however patient states that is mostly chronic.  No obvious signs of trauma on to the neck or head.  Patient does have moderate left lateral lower chest wall tenderness to palpation.  Patient has mild left hip tenderness to palpation as well as mild pain with range of motion of the left hip.  Mild pain with range of motion of the right hip as well but does admit to chronic arthritis.  No obvious shortening or rotation noted.  Neuro vastly intact distally. Neurologic:  Normal speech and language. No gross focal neurologic deficits Skin:  Skin is warm, dry and intact.  Psychiatric: Mood and affect are normal.   ____________________________________________    EKG  EKG viewed and interpreted by myself shows a normal sinus rhythm at 67 bpm with a narrow QRS, normal axis, normal intervals, no concerning ST changes.  ____________________________________________    RADIOLOGY  CT head and C-spine showed no acute abnormality. CT scan of the chest shows 10th 11th and 12th rib fractures. Hip/pelvis x-ray shows bilateral hip replacements but no other acute abnormality  ____________________________________________   INITIAL IMPRESSION / ASSESSMENT AND  PLAN / ED COURSE  Pertinent labs & imaging results that were available during my care of the patient were reviewed by me and considered in my medical decision making (see chart for details).   Patient presents emergency department after a fall at her nursing facility.  Pain to the left lateral chest as well as left hip.  Has not been ambulatory since the fall per patient.  Does have mild tenderness to palpation of the left hip and pain with range of motion but she is able to range the hip.  Moderate left lower lateral chest wall tenderness to palpation.  Mild C-spine tenderness although she states this is chronic.  We will obtain CT imaging of the head C-spine and chest, we will obtain x-rays of the left hip and pelvis.  Patient agreeable to plan of care.  Given the patient's continued left chest pain despite tramadol and fentanyl we will admit for pain control.  Daughter agreeable to plan of care.  Monique Burns was evaluated in Emergency Department on 12/29/2020 for the symptoms described in the history of present illness. She was evaluated in the context of the global COVID-19 pandemic, which necessitated consideration that the patient might be at risk for infection with the SARS-CoV-2 virus that causes COVID-19. Institutional protocols and algorithms that pertain to the evaluation of patients at risk for COVID-19 are in a state of rapid change based on information released by regulatory bodies including the CDC and federal and state organizations. These policies and algorithms were followed during the patient's care in the ED.  ____________________________________________   FINAL CLINICAL IMPRESSION(S) / ED DIAGNOSES  Fall Multiple rib fractures    Harvest Dark, MD 12/29/20 2236

## 2020-12-29 NOTE — H&P (Signed)
[place]   PATIENT NAME: Monique Burns    MR#:  [id number]  DATE OF BIRTH:  [birth year]  DATE OF ADMISSION:  12/29/2020  PRIMARY CARE PHYSICIAN: Adin Hector, MD   Patient is coming from: Home  REQUESTING/REFERRING PHYSICIAN: Harvest Dark, MD  CHIEF COMPLAINT:   Chief Complaint  Patient presents with   Fall    HISTORY OF PRESENT ILLNESS:  Monique Burns is a 87 y.o. Caucasian female with medical history significant for hypertension, dyslipidemia, coronary artery disease status post PCI and 4 stents and CABG, CHF, depression and osteoarthritis, presented to the ER with acute onset of mechanical fall on her left side with subsequent left hip pain as well as left-sided chest pain.  The patient was in the bathroom brushing her teeth and she slipped on a moist floor beside the sink.  She reported no head injuries.  No headache or dizziness or blurred vision or presyncope or syncope prior to her fall.  No nausea or vomiting or abdominal pain.  No chest pain or palpitations.  No paresthesias or focal muscle weakness.  ED Course: Upon presentation to the ER blood pressure was 180/58 and later 149/60 with otherwise normal vital signs.  Labs revealed a BUN of 38 and creatinine 1.99 compared to 35/1.63 on 08/25/2020.  Hemoglobin and hematocrits were 11.3 and 33 comparable to previous levels.  Respiratory panel is currently pending. EKG as reviewed by me : EKG showed normal sinus rhythm with a rate of 67 with left atrial enlargement and LVH with secondary repolarization abnormality. Imaging: Noncontrast head CT scan showed atrophy and chronic microvascular disease with no acute intracranial normality.  C-spine CT shows degenerative disc and facet disease with no acute bony abnormalities.  Chest CT revealed multiple acute left rib fractures involving the left 10th 11th and 12th ribs with no pneumothorax.  It showed chronic nodular scarring in the lower lungs and aortic  atherosclerosis.  Hip x-ray showed bilateral hip replacement with no acute bony abnormality.  The patient was given 50 mcg of IV fentanyl and 50 mg of p.o. tramadol.  She was still having pain and not able to ambulate.  She will be admitted to an observation medical bed for further evaluation and management. PAST MEDICAL HISTORY:   Past Medical History:  Diagnosis Date   Anemia    Arthritis    Blood dyscrasia    since being put on Brilinta after stent placement developing hematoma   Cancer ([place])    basal cell left leg   CHF (congestive heart failure) (H[facility])    GERD (gastroesophageal reflux disease)    Headache    h/o migraines   Hyperlipidemia    Hypertension    Leukopenia    MI (myocardial infarction) ([place]) 02/2015   s/p 4 stents and CABG in 2017   Renal disorder    stage 3-Dr Caryl Comes keeping a check on kidney function   Shortness of breath dyspnea    mild compared to prior stenting-pt states she would have to stop and rest if walking a mile   Thyroid disease     PAST SURGICAL HISTORY:   Past Surgical History:  Procedure Laterality Date   ABDOMINAL HYSTERECTOMY     CARDIAC CATHETERIZATION N/A 12/08/2015   Procedure: Left Heart Cath and Coronary Angiography;  Surgeon: Yolonda Kida, MD;  Location: [facility];  Service: Cardiovascular;  Laterality: N/A;   CORONARY ANGIOPLASTY WITH STENT PLACEMENT  CORONARY STENT PLACEMENT  2016   x2   EYE SURGERY Bilateral 2011   INCISION AND DRAINAGE Left 08/26/2015   Procedure: INCISION AND DRAINAGE / HAND HEMATOMA;  Surgeon: Hessie Knows, MD;  Location: [facility];  Service: Orthopedics;  Laterality: Left;   JOINT REPLACEMENT Bilateral    left 2016 and right 2015   TONSILLECTOMY     3rd grade    SOCIAL HISTORY:   Social History   Tobacco Use   Smoking status: Never   Smokeless tobacco: Never  Substance Use Topics   Alcohol use: No    FAMILY HISTORY:   Family History  Problem Relation Age of Onset   Breast  cancer Paternal Grandmother    Brain cancer Mother    Stroke Father    Brain cancer Son     DRUG ALLERGIES:   Allergies  Allergen Reactions   Brilinta [Ticagrelor] Other (See Comments)    "caused stent to fail"   Imdur [Isosorbide Nitrate] Other (See Comments)    Headache    REVIEW OF SYSTEMS:   ROS As per history of present illness. All pertinent systems were reviewed above. Constitutional, HEENT, cardiovascular, respiratory, GI, GU, musculoskeletal, neuro, psychiatric, endocrine, integumentary and hematologic systems were reviewed and are otherwise negative/unremarkable except for positive findings mentioned above in the HPI.   MEDICATIONS AT HOME:   Prior to Admission medications   Medication Sig Start Date End Date Taking? Authorizing Provider  acetaminophen (TYLENOL) 500 MG tablet Take 500-1,000 mg by mouth every 6 (six) hours as needed for pain.    [provider]  amLODipine (NORVASC) 2.5 MG tablet Take 1 tablet (2.5 mg total) by mouth daily. 06/20/20   Rudene Re, MD  aspirin EC 81 MG tablet Take 81 mg by mouth daily.    [provider]  atorvastatin (LIPITOR) 80 MG tablet Take 80 mg by mouth every evening.     [provider]  cycloSPORINE (RESTASIS) 0.05 % ophthalmic emulsion Place 1 drop into both eyes 2 (two) times daily.     [provider]  DULoxetine (CYMBALTA) 30 MG capsule Take 30 mg by mouth every evening.    [provider]  ferrous sulfate 325 (65 FE) MG tablet Take 1 tablet (325 mg total) by mouth daily with breakfast. 06/02/20   Nolberto Hanlon, MD  lamoTRIgine (LAMICTAL) 25 MG tablet Take 25 mg by mouth every evening.    [provider]  lidocaine (LIDODERM) 5 % Place 1 patch onto the skin daily. Remove & Discard patch within 12 hours or as directed by MD 06/01/20   Nolberto Hanlon, MD  metoprolol succinate (TOPROL-XL) 25 MG 24 hr tablet Take 25 mg by mouth every evening. 05/03/20   [provider]   Multiple Vitamin (MULTIVITAMIN ADULT PO) Take 1 tablet by mouth daily.    [provider]  nitroGLYCERIN (NITROSTAT) 0.4 MG SL tablet Place 1 tablet (0.4 mg total) under the tongue once as needed for up to 1 dose for chest pain. 06/20/20   Rudene Re, MD  ondansetron [facility]) 4 MG tablet  08/05/20   [provider]  polyethylene glycol powder (GLYCOLAX/MIRALAX) 17 GM/SCOOP powder Take by mouth.    [provider]  ranolazine (RANEXA) 1000 MG SR tablet Take 1,000 mg by mouth 2 (two) times daily. 11/30/17   [provider]  Vibegron (GEMTESA) 75 MG TABS Take 75 mg by mouth daily. 05/10/20   Debroah Loop, PA-C      VITAL SIGNS:  Blood pressure (!) 146/58, pulse 65, temperature 97.9 F (36.6 C), temperature source Oral, resp. rate 14, height 5\' 7"  (1.702 m), weight 52.2 kg, SpO2 99 %.  PHYSICAL EXAMINATION:  Physical Exam  GENERAL:  87 y.o.-year-old Caucasian female  patient lying in the bed with no acute distress.  EYES: Pupils equal, round, reactive to light and accommodation. No scleral icterus. Extraocular muscles intact.  HEENT: Head atraumatic, normocephalic. Oropharynx and nasopharynx clear.  NECK:  Supple, no jugular venous distention. No thyroid enlargement, no tenderness.  LUNGS: Normal breath sounds bilaterally, no wheezing, rales,rhonchi or crepitation. No use of accessory muscles of respiration.  CARDIOVASCULAR: Regular rate and rhythm, S1, S2 normal. No murmurs, rubs, or gallops.  ABDOMEN: Soft, nondistended, nontender. Bowel sounds present. No organomegaly or mass.  EXTREMITIES: No pedal edema, cyanosis, or clubbing.  NEUROLOGIC: Cranial nerves II through XII are intact. Muscle strength 5/5 in all extremities. Sensation intact. Gait not checked. Musculoskeletal: Tender over left 10th through 12th ribs. PSYCHIATRIC: The patient is alert and oriented x 3.  Normal affect and good eye contact. SKIN: No obvious rash, lesion, or  ulcer.   LABORATORY PANEL:   CBC Recent Labs  Lab 12/29/20 1926  WBC 5.3  HGB 11.3*  HCT 33.0*  PLT 288   ------------------------------------------------------------------------------------------------------------------  Chemistries  Recent Labs  Lab 12/29/20 1926  NA 135  K 4.4  CL 101  CO2 27  GLUCOSE 141*  BUN 38*  CREATININE 1.99*  CALCIUM 9.2  AST 40  ALT 28  ALKPHOS 61  BILITOT 0.8   ------------------------------------------------------------------------------------------------------------------  Cardiac Enzymes No results for input(s): TROPONINI in the last 168 hours. ------------------------------------------------------------------------------------------------------------------  RADIOLOGY:  CT HEAD WO CONTRAST (5MM)  Result Date: 12/29/2020 CLINICAL DATA:  Fall EXAM: CT HEAD WITHOUT CONTRAST TECHNIQUE: Contiguous axial images were obtained from the base of the skull through the vertex without intravenous contrast. COMPARISON:  08/18/2020 FINDINGS: Brain: There is atrophy and chronic small vessel disease changes. No acute intracranial abnormality. Specifically, no hemorrhage, hydrocephalus, mass lesion, acute infarction, or significant intracranial injury. Vascular: No hyperdense vessel or unexpected calcification. Skull: No acute calvarial abnormality. Sinuses/Orbits: No acute findings Other: None IMPRESSION: Atrophy, chronic microvascular disease. No acute intracranial abnormality. Electronically Signed   By: Rolm Baptise M.D.   On: 12/29/2020 20:12   CT CHEST WO CONTRAST  Result Date: 12/29/2020 CLINICAL DATA:  Left-sided chest wall pain after a fall. Slip and fall injury. EXAM: CT CHEST WITHOUT CONTRAST TECHNIQUE: Multidetector CT imaging of the chest was performed following the standard protocol without IV contrast. COMPARISON:  CT 06/16/2020.  Chest 08/25/2020 FINDINGS: Cardiovascular: Normal heart size. No pericardial effusions. Postoperative coronary  bypass. Calcification of the aorta and coronary arteries. No aortic aneurysm. Mediastinum/Nodes: No enlarged mediastinal or axillary lymph nodes. Thyroid gland, trachea, and esophagus demonstrate no significant findings. Lungs/Pleura: Nodular scarring in the lung apices, likely postinflammatory and unchanged since prior study. Mild atelectasis in the lung bases with band like opacities bilaterally. Also no change. No focal consolidation or airspace disease. No pleural effusions. No pneumothorax. Upper Abdomen: No acute abnormalities. Musculoskeletal: Postoperative median sternotomy. There is an acute appearing fracture of the left twelfth rib, incompletely included within the field of view. Acute mildly displaced fractures of the posterior left tenth and eleventh ribs as well. Degenerative changes in the spine. IMPRESSION: 1. Multiple acute left rib fractures.  No pneumothorax. 2. Chronic nodular scarring in the lungs. 3. Aortic atherosclerosis. Electronically Signed   By: Oren Beckmann.D.  On: 12/29/2020 20:18   CT Cervical Spine Wo Contrast  Result Date: 12/29/2020 CLINICAL DATA:  Fall EXAM: CT CERVICAL SPINE WITHOUT CONTRAST TECHNIQUE: Multidetector CT imaging of the cervical spine was performed without intravenous contrast. Multiplanar CT image reconstructions were also generated. COMPARISON:  08/18/2020 FINDINGS: Alignment: No subluxation. Skull base and vertebrae: No acute fracture. No primary bone lesion or focal pathologic process. Soft tissues and spinal canal: No prevertebral fluid or swelling. No visible canal hematoma. Disc levels: Diffuse degenerative disc disease with disc space narrowing and spurring, most pronounced at C5-6 and C6-7. Bilateral degenerative facet disease. Upper chest: Biapical scarring. Calcified granuloma in the left upper lobe. Other: None IMPRESSION: Degenerative disc and facet disease.  No acute bony abnormality. Electronically Signed   By: Rolm Baptise M.D.   On:  12/29/2020 20:13   DG HIP UNILAT WITH PELVIS 2-3 VIEWS LEFT  Result Date: 12/29/2020 CLINICAL DATA:  Fall, left hip pain EXAM: DG HIP (WITH OR WITHOUT PELVIS) 2-3V LEFT COMPARISON:  05/28/2020 FINDINGS: Bilateral hip replacements. No hardware complicating feature. No acute bony abnormality. Specifically, no fracture, subluxation, or dislocation. IMPRESSION: Bilateral hip replacement.  No acute bony abnormality. Electronically Signed   By: Rolm Baptise M.D.   On: 12/29/2020 20:15      IMPRESSION AND PLAN:  Active Problems:   Fall  1.  Mechanical fall with subsequent left 10th, 11th and 12th rib fractures. - The patient will be admitted to an observation medical bed. - Pain management to be provided. - Physical therapy consult to be obtained. - Pulse oximetry will be monitored  2.  Acute kidney injury superimposed on stage IIIb chronic kidney disease. - The patient will be hydrated with IV normal saline. - We will follow her BMP. - We will avoid nephrotoxins.  3.  Essential hypertension. - We will continue Norvasc and Toprol-XL.  4.  Dyslipidemia. - We will continue statin therapy.  5.  Depression. - We will continue Cymbalta.  6.  Coronary artery disease status post PCI and 4 stents, status post CABG. - We will continue aspirin, statin therapy and beta-blocker therapy as well as Ranexa.  7.  History of migraine. - We will continue to max.   DVT prophylaxis: Lovenox.  Code Status: Partial code.  The patient is DNI only Family Communication:  The plan of care was discussed in details with the patient (and family). I answered all questions. The patient agreed to proceed with the above mentioned plan. Further management will depend upon hospital course. Disposition Plan: Back to previous home environment Consults called: none.  All the records are reviewed and case discussed with ED provider.  Status is: Observation  The patient remains OBS appropriate and will d/c before  2 midnights.  Dispo: The patient is from: ALF              Anticipated d/c is to: ALF              Patient currently is not medically stable to d/c.   Difficult to place patient No  TOTAL TIME TAKING CARE OF THIS PATIENT: 55 minutes.    Christel Mormon M.D on 12/29/2020 at 10:48 PM  [facility]   From 7 PM-7 AM, contact night-coverage [email or web address]  CC: Primary care physician; Adin Hector, MD

## 2020-12-29 NOTE — ED Triage Notes (Signed)
Pt to ED via EMS from [facility], pt reports that she slipped on water after brushing her teeth in the bathroom. Pt states she is having L hip and L rib pain. Pt denies hitting head or being on blood thinners. Pt a&o x4.

## 2020-12-30 ENCOUNTER — Encounter: Payer: Self-pay | Admitting: Family Medicine

## 2020-12-30 DIAGNOSIS — S2242XA Multiple fractures of ribs, left side, initial encounter for closed fracture: Secondary | ICD-10-CM | POA: Diagnosis not present

## 2020-12-30 DIAGNOSIS — N179 Acute kidney failure, unspecified: Secondary | ICD-10-CM | POA: Diagnosis not present

## 2020-12-30 DIAGNOSIS — I10 Essential (primary) hypertension: Secondary | ICD-10-CM | POA: Diagnosis not present

## 2020-12-30 DIAGNOSIS — E43 Unspecified severe protein-calorie malnutrition: Secondary | ICD-10-CM | POA: Insufficient documentation

## 2020-12-30 LAB — BASIC METABOLIC PANEL
Anion gap: 8 (ref 5–15)
BUN: 33 mg/dL — ABNORMAL HIGH (ref 8–23)
CO2: 22 mmol/L (ref 22–32)
Calcium: 8.5 mg/dL — ABNORMAL LOW (ref 8.9–10.3)
Chloride: 105 mmol/L (ref 98–111)
Creatinine, Ser: 1.65 mg/dL — ABNORMAL HIGH (ref 0.44–1.00)
GFR, Estimated: 30 mL/min — ABNORMAL LOW (ref >60)
Glucose, Bld: 95 mg/dL (ref 70–99)
Potassium: 3.9 mmol/L (ref 3.5–5.1)
Sodium: 135 mmol/L (ref 135–145)

## 2020-12-30 LAB — CBC
HCT: 29.2 % — ABNORMAL LOW (ref 36.0–46.0)
Hemoglobin: 9.8 g/dL — ABNORMAL LOW (ref 12.0–15.0)
MCH: 33.6 pg (ref 26.0–34.0)
MCHC: 33.6 g/dL (ref 30.0–36.0)
MCV: 100 fL (ref 80.0–100.0)
Platelets: 220 10*3/uL (ref 150–400)
RBC: 2.92 MIL/uL — ABNORMAL LOW (ref 3.87–5.11)
RDW: 12.6 % (ref 11.5–15.5)
WBC: 5.1 10*3/uL (ref 4.0–10.5)
nRBC: 0 % (ref 0.0–0.2)

## 2020-12-30 MED ORDER — ADULT MULTIVITAMIN W/MINERALS CH
1.0000 | ORAL_TABLET | Freq: Every day | ORAL | Status: DC
  Administered 2020-12-30 – 2021-01-01 (×3): 1 via ORAL
  Filled 2020-12-30 (×3): qty 1

## 2020-12-30 MED ORDER — LAMOTRIGINE 25 MG PO TABS
25.0000 mg | ORAL_TABLET | Freq: Every day | ORAL | Status: DC
  Administered 2020-12-30 – 2021-01-01 (×3): 25 mg via ORAL
  Filled 2020-12-30 (×3): qty 1

## 2020-12-30 MED ORDER — DULOXETINE HCL 30 MG PO CPEP
30.0000 mg | ORAL_CAPSULE | Freq: Every day | ORAL | Status: DC
  Administered 2020-12-30 – 2021-01-01 (×3): 30 mg via ORAL
  Filled 2020-12-30 (×3): qty 1

## 2020-12-30 MED ORDER — ASPIRIN EC 81 MG PO TBEC
81.0000 mg | DELAYED_RELEASE_TABLET | Freq: Every day | ORAL | Status: DC
  Administered 2020-12-30 – 2021-01-01 (×3): 81 mg via ORAL
  Filled 2020-12-30 (×3): qty 1

## 2020-12-30 MED ORDER — ATORVASTATIN CALCIUM 20 MG PO TABS
40.0000 mg | ORAL_TABLET | Freq: Every day | ORAL | Status: DC
  Administered 2020-12-30 – 2021-01-01 (×3): 40 mg via ORAL
  Filled 2020-12-30 (×3): qty 2

## 2020-12-30 MED ORDER — RANOLAZINE ER 500 MG PO TB12
500.0000 mg | ORAL_TABLET | Freq: Two times a day (BID) | ORAL | Status: DC
  Administered 2020-12-30 – 2021-01-01 (×5): 500 mg via ORAL
  Filled 2020-12-30 (×6): qty 1

## 2020-12-30 MED ORDER — BOOST / RESOURCE BREEZE PO LIQD CUSTOM
1.0000 | ORAL | Status: DC
Start: 2020-12-30 — End: 2021-01-01
  Administered 2020-12-30 – 2021-01-01 (×4): 1 via ORAL

## 2020-12-30 MED ORDER — ENSURE ENLIVE PO LIQD
237.0000 mL | Freq: Two times a day (BID) | ORAL | Status: DC
  Administered 2020-12-30 – 2021-01-01 (×5): 237 mL via ORAL

## 2020-12-30 MED ORDER — METOPROLOL SUCCINATE ER 25 MG PO TB24
25.0000 mg | ORAL_TABLET | Freq: Every day | ORAL | Status: DC
  Administered 2020-12-30 – 2021-01-01 (×3): 25 mg via ORAL
  Filled 2020-12-30 (×3): qty 1

## 2020-12-30 NOTE — Evaluation (Signed)
Occupational Therapy Evaluation Patient Details Name: Monique Burns MRN: [id number] DOB: August 05, 1930 Today's Date: 12/30/2020    History of Present Illness Monique Burns is an 3yoF who comes [facility] on 12/29/20 s/p mechanical fall onto left side c left hip pain. PMH: Parkinson-like dementia c LUE tremor, HTN, CAD s/p PCI and stent x4, CABG, CHF, depression, OA, Rt temporal CVA, bilat THA. Imaging revealing of fracture to Lt ribs 10-12.   Clinical Impression   [name] was seen for OT evaluation this date. Prior to hospital admission, pt was resident at [facility] receiving PRN assist for mobility and ADLs. Pt presents to acute OT demonstrating impaired ADL performance and functional mobility 2/2 decreased activity tolerance and poor safety awareness. Pt currently requires CGA + RW for toilet t/f, MIN A for perihygiene - assist for thoroughness. MOD A don/doff briefs at EOB. Pt would benefit from skilled OT to address noted impairments to maximize safety and independence while minimizing falls risk and caregiver burden. Upon hospital discharge, recommend HHOT to maximize pt safety and return to functional independence during meaningful occupations of daily life.     Follow Up Recommendations  Home health OT    Equipment Recommendations  3 in 1 bedside commode    Recommendations for Other Services       Precautions / Restrictions Precautions Precautions: Fall Restrictions Weight Bearing Restrictions: No      Mobility Bed Mobility Overal bed mobility: Needs Assistance Bed Mobility: Supine to Sit;Sit to Supine     Supine to sit: Min assist;HOB elevated Sit to supine: Min assist;HOB elevated   General bed mobility comments: assist for trunk 2/2 rib pain    Transfers Overall transfer level: Needs assistance Equipment used: Rolling walker (2 wheeled) Transfers: Sit to/from Stand Sit to Stand: Min guard              Balance Overall balance assessment: History of  Falls;Needs assistance Sitting-balance support: Feet supported;No upper extremity supported Sitting balance-Leahy Scale: Good     Standing balance support: Single extremity supported;During functional activity Standing balance-Leahy Scale: Fair                             ADL either performed or assessed with clinical judgement   ADL Overall ADL's : Needs assistance/impaired                                       General ADL Comments: CGA + RW for toilet t/f, MIN A for perihygiene - assist for thoroughness. MOD A don/doff briefs at EOB.      Pertinent Vitals/Pain Pain Assessment: Faces Faces Pain Scale: Hurts little more Pain Location: Left ribs Pain Descriptors / Indicators: Discomfort;Dull;Grimacing Pain Intervention(s): Limited activity within patient's tolerance;Repositioned     Hand Dominance Right   Extremity/Trunk Assessment Upper Extremity Assessment Upper Extremity Assessment: Generalized weakness   Lower Extremity Assessment Lower Extremity Assessment: Generalized weakness       Communication Communication Communication: No difficulties   Cognition Arousal/Alertness: Awake/alert Behavior During Therapy: WFL for tasks assessed/performed Overall Cognitive Status: History of cognitive impairments - at baseline                                 General Comments: states age as "in my 51s" and situation as "  i broke 3 ribs"   General Comments       Exercises Exercises: Other exercises Other Exercises Other Exercises: Pt and family educated re: OT role, DME recs, d/c recs, falls prevention Other Exercises: LBD, toileting, sup<>sit, sit<>stand, sitting/standing blaance/tolerance,   Shoulder Instructions      Home Living Family/patient expects to be discharged to:: Assisted living ([facility] ~6 months)                             Home Equipment: Walker - 4 wheels          Prior  Functioning/Environment Level of Independence: Needs assistance  Gait / Transfers Assistance Needed: 3 falls in past 6 months, fluctuating mobility tolerance ADL's / Homemaking Assistance Needed: >50% of ADL assist from staff   Comments: pt has assistance if she calls for it using her necklace call bell, frequently does not call        OT Problem List: Decreased strength;Decreased range of motion;Decreased activity tolerance;Decreased safety awareness      OT Treatment/Interventions: Self-care/ADL training;Therapeutic exercise;Energy conservation;DME and/or AE instruction;Therapeutic activities;Patient/family education    OT Goals(Current goals can be found in the care plan section) Acute Rehab OT Goals Patient Stated Goal: be able to return to [place], reduce falls. OT Goal Formulation: With patient/family Time For Goal Achievement: 01/13/21 Potential to Achieve Goals: Good ADL Goals Pt Will Transfer to Toilet: with modified independence;ambulating (c LRAD PRN) Pt Will Perform Toileting - Clothing Manipulation and hygiene: with modified independence;sitting/lateral leans Additional ADL Goal #1: Pt will verbalize plan to implement x3 falls prevention strategies c MIN cues  OT Frequency: Min 1X/week    AM-PAC OT "6 Clicks" Daily Activity     Outcome Measure Help from another person eating meals?: A Little Help from another person taking care of personal grooming?: A Little Help from another person toileting, which includes using toliet, bedpan, or urinal?: A Little Help from another person bathing (including washing, rinsing, drying)?: A Little Help from another person to put on and taking off regular upper body clothing?: A Little Help from another person to put on and taking off regular lower body clothing?: A Little 6 Click Score: 18   End of Session Equipment Utilized During Treatment: Rolling walker  Activity Tolerance: Patient tolerated treatment well Patient left: in  bed;with call bell/phone within reach;with bed alarm set;with family/visitor present  OT Visit Diagnosis: Unsteadiness on feet (R26.81);Other abnormalities of gait and mobility (R26.89)                Time: 0762-2633 OT Time Calculation (min): 28 min Charges:  OT General Charges $OT Visit: 1 Visit OT Evaluation $OT Eval Low Complexity: 1 Low OT Treatments $Self Care/Home Management : 8-22 mins  Dessie Coma, M.S. OTR/L  12/30/20, 1:56 PM  [email or web address] [phone]

## 2020-12-30 NOTE — Progress Notes (Signed)
PROGRESS NOTE    Monique Burns  TOI:[id number] DOB: Aug 09, 1930 DOA: 12/29/2020 PCP: Adin Hector, MD    Assessment & Plan:   Active Problems:   Fall   Rib fractures: of left 10th, 11th and 12th ribs. Secondary to fall at home. Encourage incentive spirometry. Oxycodone, morphine prn   AKI on CKDIIIb: Cr is trending down from day prior. Continue on IVFs  HTN: continue on home dose of metroprolol.  HLD: continue on statin   Depression: severity unknown. Continue on home dose of duloxetine   Hx of CAD: s/p 4 stents & CABG. Continue on metoprolol, statin, aspirin & ranexa    DVT prophylaxis: lovenox Code Status: partial  Family Communication:  Disposition Plan: depends on PT/OT recs .  Level of care: Med-Surg  Status is: Observation  The patient remains OBS appropriate and will d/c before 2 midnights.  Dispo: The patient is from: Home              Anticipated d/c is to: Home vs SNF              Patient currently is not medically stable to d/c.   Difficult to place patient : unclear    Consultants:    Procedures:  Antimicrobials:   Subjective: Pt c/o rib cage pain   Objective: Vitals:   12/29/20 2330 12/29/20 2339 12/29/20 2359 12/30/20 0420  BP: (!) 142/60  (!) 188/59 (!) 120/57  Pulse: 64  64 (!) 59  Resp: 16  16 16   Temp:  98.1 F (36.7 C) 97.9 F (36.6 C) 97.9 F (36.6 C)  TempSrc:  Oral    SpO2: 100%  100% 97%  Weight:      Height:       No intake or output data in the 24 hours ending 12/30/20 0747 Filed Weights   12/29/20 1922  Weight: 52.2 kg    Examination:  General exam: Appears calm and comfortable  Respiratory system: diminished breath sounds b/l otherwise clear Cardiovascular system: S1 & S2+. No rubs, gallops or clicks.  Gastrointestinal system: Abdomen is nondistended, soft and nontender. Normal bowel sounds heard. Central nervous system: Alert and awake. Moves all extremities  Psychiatry: Judgement and insight appear  abnormal. Flat mood and affec    Data Reviewed: I have personally reviewed following labs and imaging studies  CBC: Recent Labs  Lab 12/29/20 1926 12/30/20 0439  WBC 5.3 5.1  HGB 11.3* 9.8*  HCT 33.0* 29.2*  MCV 101.5* 100.0  PLT 288 833   Basic Metabolic Panel: Recent Labs  Lab 12/29/20 1926 12/30/20 0439  NA 135 135  K 4.4 3.9  CL 101 105  CO2 27 22  GLUCOSE 141* 95  BUN 38* 33*  CREATININE 1.99* 1.65*  CALCIUM 9.2 8.5*   GFR: Estimated Creatinine Clearance: 19.4 mL/min (A) (by C-G formula based on SCr of 1.65 mg/dL (H)). Liver Function Tests: Recent Labs  Lab 12/29/20 1926  AST 40  ALT 28  ALKPHOS 61  BILITOT 0.8  PROT 7.8  ALBUMIN 4.6   No results for input(s): LIPASE, AMYLASE in the last 168 hours. No results for input(s): AMMONIA in the last 168 hours. Coagulation Profile: No results for input(s): INR, PROTIME in the last 168 hours. Cardiac Enzymes: No results for input(s): CKTOTAL, CKMB, CKMBINDEX, TROPONINI in the last 168 hours. BNP (last 3 results) No results for input(s): PROBNP in the last 8760 hours. HbA1C: No results for input(s): HGBA1C in the last 72 hours.  CBG: No results for input(s): GLUCAP in the last 168 hours. Lipid Profile: No results for input(s): CHOL, HDL, LDLCALC, TRIG, CHOLHDL, LDLDIRECT in the last 72 hours. Thyroid Function Tests: No results for input(s): TSH, T4TOTAL, FREET4, T3FREE, THYROIDAB in the last 72 hours. Anemia Panel: No results for input(s): VITAMINB12, FOLATE, FERRITIN, TIBC, IRON, RETICCTPCT in the last 72 hours. Sepsis Labs: No results for input(s): PROCALCITON, LATICACIDVEN in the last 168 hours.  Recent Results (from the past 240 hour(s))  Resp Panel by RT-PCR (Flu A&B, Covid) Nasopharyngeal Swab     Status: None   Collection Time: 12/29/20 10:32 PM   Specimen: Nasopharyngeal Swab; Nasopharyngeal(NP) swabs in vial transport medium  Result Value Ref Range Status   SARS Coronavirus 2 by RT PCR NEGATIVE  NEGATIVE Final    Comment: (NOTE) SARS-CoV-2 target nucleic acids are NOT DETECTED.  The SARS-CoV-2 RNA is generally detectable in upper respiratory specimens during the acute phase of infection. The lowest concentration of SARS-CoV-2 viral copies this assay can detect is 138 copies/mL. A negative result does not preclude SARS-Cov-2 infection and should not be used as the sole basis for treatment or other patient management decisions. A negative result may occur with  improper specimen collection/handling, submission of specimen other than nasopharyngeal swab, presence of viral mutation(s) within the areas targeted by this assay, and inadequate number of viral copies(<138 copies/mL). A negative result must be combined with clinical observations, patient history, and epidemiological information. The expected result is Negative.  Fact Sheet for Patients:  EntrepreneurPulse.com.au  Fact Sheet for Healthcare Providers:  IncredibleEmployment.be  This test is no t yet approved or cleared by the [place] FDA and  has been authorized for detection and/or diagnosis of SARS-CoV-2 by FDA under an Emergency Use Authorization (EUA). This EUA will remain  in effect (meaning this test can be used) for the duration of the COVID-19 declaration under Section 564(b)(1) of the Act, 21 U.S.C.section 360bbb-3(b)(1), unless the authorization is terminated  or revoked sooner.       Influenza A by PCR NEGATIVE NEGATIVE Final   Influenza B by PCR NEGATIVE NEGATIVE Final    Comment: (NOTE) The Xpert Xpress SARS-CoV-2/FLU/RSV plus assay is intended as an aid in the diagnosis of influenza from Nasopharyngeal swab specimens and should not be used as a sole basis for treatment. Nasal washings and aspirates are unacceptable for Xpert Xpress SARS-CoV-2/FLU/RSV testing.  Fact Sheet for Patients: EntrepreneurPulse.com.au  Fact Sheet for Healthcare  Providers: IncredibleEmployment.be  This test is not yet approved or cleared by the [place] FDA and has been authorized for detection and/or diagnosis of SARS-CoV-2 by FDA under an Emergency Use Authorization (EUA). This EUA will remain in effect (meaning this test can be used) for the duration of the COVID-19 declaration under Section 564(b)(1) of the Act, 21 U.S.C. section 360bbb-3(b)(1), unless the authorization is terminated or revoked.  Performed at [facility], [address] [place], [place] [address]          Radiology Studies: CT HEAD WO CONTRAST (5MM)  Result Date: 12/29/2020 CLINICAL DATA:  Fall EXAM: CT HEAD WITHOUT CONTRAST TECHNIQUE: Contiguous axial images were obtained from the base of the skull through the vertex without intravenous contrast. COMPARISON:  08/18/2020 FINDINGS: Brain: There is atrophy and chronic small vessel disease changes. No acute intracranial abnormality. Specifically, no hemorrhage, hydrocephalus, mass lesion, acute infarction, or significant intracranial injury. Vascular: No hyperdense vessel or unexpected calcification. Skull: No acute calvarial abnormality. Sinuses/Orbits: No acute findings  Other: None IMPRESSION: Atrophy, chronic microvascular disease. No acute intracranial abnormality. Electronically Signed   By: Rolm Baptise M.D.   On: 12/29/2020 20:12   CT CHEST WO CONTRAST  Result Date: 12/29/2020 CLINICAL DATA:  Left-sided chest wall pain after a fall. Slip and fall injury. EXAM: CT CHEST WITHOUT CONTRAST TECHNIQUE: Multidetector CT imaging of the chest was performed following the standard protocol without IV contrast. COMPARISON:  CT 06/16/2020.  Chest 08/25/2020 FINDINGS: Cardiovascular: Normal heart size. No pericardial effusions. Postoperative coronary bypass. Calcification of the aorta and coronary arteries. No aortic aneurysm. Mediastinum/Nodes: No enlarged mediastinal or axillary lymph nodes. Thyroid  gland, trachea, and esophagus demonstrate no significant findings. Lungs/Pleura: Nodular scarring in the lung apices, likely postinflammatory and unchanged since prior study. Mild atelectasis in the lung bases with band like opacities bilaterally. Also no change. No focal consolidation or airspace disease. No pleural effusions. No pneumothorax. Upper Abdomen: No acute abnormalities. Musculoskeletal: Postoperative median sternotomy. There is an acute appearing fracture of the left twelfth rib, incompletely included within the field of view. Acute mildly displaced fractures of the posterior left tenth and eleventh ribs as well. Degenerative changes in the spine. IMPRESSION: 1. Multiple acute left rib fractures.  No pneumothorax. 2. Chronic nodular scarring in the lungs. 3. Aortic atherosclerosis. Electronically Signed   By: Lucienne Capers M.D.   On: 12/29/2020 20:18   CT Cervical Spine Wo Contrast  Result Date: 12/29/2020 CLINICAL DATA:  Fall EXAM: CT CERVICAL SPINE WITHOUT CONTRAST TECHNIQUE: Multidetector CT imaging of the cervical spine was performed without intravenous contrast. Multiplanar CT image reconstructions were also generated. COMPARISON:  08/18/2020 FINDINGS: Alignment: No subluxation. Skull base and vertebrae: No acute fracture. No primary bone lesion or focal pathologic process. Soft tissues and spinal canal: No prevertebral fluid or swelling. No visible canal hematoma. Disc levels: Diffuse degenerative disc disease with disc space narrowing and spurring, most pronounced at C5-6 and C6-7. Bilateral degenerative facet disease. Upper chest: Biapical scarring. Calcified granuloma in the left upper lobe. Other: None IMPRESSION: Degenerative disc and facet disease.  No acute bony abnormality. Electronically Signed   By: Rolm Baptise M.D.   On: 12/29/2020 20:13   DG HIP UNILAT WITH PELVIS 2-3 VIEWS LEFT  Result Date: 12/29/2020 CLINICAL DATA:  Fall, left hip pain EXAM: DG HIP (WITH OR WITHOUT  PELVIS) 2-3V LEFT COMPARISON:  05/28/2020 FINDINGS: Bilateral hip replacements. No hardware complicating feature. No acute bony abnormality. Specifically, no fracture, subluxation, or dislocation. IMPRESSION: Bilateral hip replacement.  No acute bony abnormality. Electronically Signed   By: Rolm Baptise M.D.   On: 12/29/2020 20:15        Scheduled Meds:  enoxaparin (LOVENOX) injection  30 mg Subcutaneous Q24H   Continuous Infusions:  sodium chloride 100 mL/hr at 12/30/20 0003     LOS: 0 days    Time spent: 33 mins     Wyvonnia Dusky, MD [facility] Pager [phone]  If 7PM-7AM, please contact night-coverage 12/30/2020, 7:47 AM

## 2020-12-30 NOTE — TOC Progression Note (Addendum)
Transition of Care [facility]) - Progression Note    Patient Details  Name: Monique Burns MRN: [id number] Date of Birth: 12/16/1930  Transition of Care [facility]) CM/SW [place], RN Phone Number: 12/30/2020, 10:34 AM  Clinical Narrative:    The patient is a resident at [facility], [place] [facility] to set up hh PT, they use [facility] for [facility] services [phone] left a message for Marisue [place] for a call back, the patient has a rolator, Left a VM for [name], the patient's daughter to call back  Update, [name] called back and stated that the patient has a rolator and a wheelchair as well as a gait belt at the facility and has no need for more DME, she is agreeable to [facility] PT being set up when the patient returns to [facility]    Expected Discharge Plan and Services                                                 Social Determinants of Health (SDOH) Interventions    Readmission Risk Interventions No flowsheet data found.

## 2020-12-30 NOTE — Progress Notes (Signed)
Initial Nutrition Assessment  DOCUMENTATION CODES:  Severe malnutrition in context of chronic illness, Underweight  INTERVENTION:  Downgrade diet to Dysphagia 3.  Add Ensure Plus po BID, each supplement provides 350 kcal and 13 grams of protein.   Add Boost Breeze po daily, each supplement provides 250 kcal and 9 grams of protein   Add Magic cup TID with meals, each supplement provides 290 kcal and 9 grams of protein.  Add MVI with minerals daily.  NUTRITION DIAGNOSIS:  Severe Malnutrition related to chronic illness (osteoarthritis) as evidenced by severe fat depletion, severe muscle depletion.  GOAL:  Patient will meet greater than or equal to 90% of their needs  MONITOR:  Diet advancement, PO intake, Supplement acceptance, Labs, Weight trends, I & O's  REASON FOR ASSESSMENT:  Other (Comment) (Low BMI)    ASSESSMENT:  87 yo female with a PMH of hypertension, dyslipidemia, coronary artery disease status post PCI and 4 stents and CABG, CHF, depression and osteoarthritis, presented to the ER with acute onset of mechanical fall on her left side with subsequent left hip pain as well as left-sided chest pain.  The patient was in the bathroom brushing her teeth and she slipped on a moist floor beside the sink. Admitted for fall.  Spoke with pt and daughter at bedside. Pt reports eating well at home with no changes in appetite or PO intake recently. She reports she has been having increased difficulty cutting her food and that other women at the facility she resides [facility]) have their foods pre-cut for them. She thinks she may also be at that point. Daughter agrees. RD to downgrade diet to Dysphagia 3 while admitted.  Pt does not endorse any weight changes recently. Pt's weight appears consistent with previous weights, indicating it is stable. However, the previous weight was down about 5 kg than her previous weights. This may have been a statement or scale error.  On exam, pt  severely depleted in almost all areas.  Recommend adding Ensure BID, Boost Breeze daily, and Magic Cup TID, as well as MVI with minerals.  Medications: reviewed; NaCl @ 75 ml/hr via IV, oxycodone PRN (given twice today)   Labs: reviewed  NUTRITION - FOCUSED PHYSICAL EXAM: Flowsheet Row Most Recent Value  Orbital Region Moderate depletion  Upper Arm Region Severe depletion  Thoracic and Lumbar Region Moderate depletion  Buccal Region Severe depletion  Temple Region Severe depletion  Clavicle Bone Region Severe depletion  Clavicle and Acromion Bone Region Severe depletion  Scapular Bone Region Severe depletion  Dorsal Hand Severe depletion  Patellar Region Moderate depletion  Anterior Thigh Region Moderate depletion  Posterior Calf Region Moderate depletion  Edema (RD Assessment) None  Hair Reviewed  Eyes Reviewed  Mouth Reviewed  Skin Reviewed  Nails Reviewed   Diet Order:   Diet Order             Diet Heart Room service appropriate? Yes; Fluid consistency: Thin  Diet effective now                  EDUCATION NEEDS:  Education needs have been addressed  Skin:  Skin Assessment: Reviewed RN Assessment  Last BM:  12/30/20  Height:  Ht Readings from Last 1 Encounters:  12/29/20 5\' 7"  (1.702 m)   Weight:  Wt Readings from Last 1 Encounters:  12/29/20 52.2 kg   BMI:  Body mass index is 18.01 kg/m.  Estimated Nutritional Needs:  Kcal:  1450-1650 Protein:  65-80 grams Fluid:  >  1.[place], RD, LDN (she/her/hers) Registered Dietitian I After-Hours/Weekend Pager # in [place]

## 2020-12-30 NOTE — Evaluation (Signed)
Physical Therapy Evaluation Patient Details Name: Monique Burns MRN: [id number] DOB: Feb 09, 1931 Today's Date: 12/30/2020   History of Present Illness  Monique Burns is an 25yoF who comes to [facility] on 12/29/20 s/p mechanical fall onto left side c left hip pain. PMH: Parkinson-like dementia c LUE tremor, HTN, CAD s/p PCI and stent x4, CABG, CHF, depression, OA, Rt temporal CVA, bilat THA. Imaging revealing of fracture to Lt ribs 10-12.  Clinical Impression  Pt admitted with above diagnosis. Pt currently with functional limitations due to the deficits listed below (see "PT Problem List"). Upon entry, pt in bed, awake and agreeable to participate, DTR at bedside. The pt is somewhat drowsy (DTR attributes to pain meds), pleasant, interactive, but often tangential and generally disoriented. DTR provides info regarding prior level of function, both in tolerance and independence. Pt requires min-modA for bed mobility and transfers, minGuard to intermittent minA for AMB. Pt has postural rigidity and bradykinesia, requires >2 minutes to AMB from bed to doorway. DTR reports AMB appears very much near baseline. Patient's performance this date reveals decreased ability, independence, and tolerance in performing all basic mobility required for performance of activities of daily living. Pt requires additional DME, close physical assistance, and cues for safe participate in mobility. Pt will benefit from skilled PT intervention to increase independence and safety with basic mobility in preparation for discharge to the venue listed below.       Follow Up Recommendations Home health PT;Supervision for mobility/OOB (can receive at ALF upon return)    Equipment Recommendations  None recommended by PT    Recommendations for Other Services       Precautions / Restrictions Precautions Precautions: Fall Restrictions Weight Bearing Restrictions: No      Mobility  Bed Mobility Overal bed mobility: Needs  Assistance Bed Mobility: Supine to Sit;Sit to Supine     Supine to sit: Min assist;HOB elevated Sit to supine: Min assist;HOB elevated   General bed mobility comments: some pain inhibition, responds well to encouragement and orientation to orientation/expectations    Transfers Overall transfer level: Needs assistance Equipment used: Rolling walker (2 wheeled) Transfers: Sit to/from Stand Sit to Stand: Min assist;From elevated surface            Ambulation/Gait Ambulation/Gait assistance: Min assist;Min guard Gait Distance (Feet): 50 Feet Assistive device: Rolling walker (2 wheeled) Gait Pattern/deviations: WFL(Within Functional Limits)     General Gait Details: bradykinetic, poor problemsolving in obstacle navigation in room, author facilitated RW during turns, generally rigid, kyphotic and forward flexed.  Stairs            Wheelchair Mobility    Modified Rankin (Stroke Patients Only)       Balance Overall balance assessment: History of Falls;Needs assistance Sitting-balance support: Feet supported;No upper extremity supported Sitting balance-Leahy Scale: Good     Standing balance support: During functional activity;Bilateral upper extremity supported Standing balance-Leahy Scale: Poor                               Pertinent Vitals/Pain Pain Assessment: Faces Faces Pain Scale: Hurts even more Pain Location: Left Pain Intervention(s): Limited activity within patient's tolerance;Monitored during session    [place] expects to be discharged to:: Assisted living ([facility] x 6 months)               Home Equipment: Walker - 4 wheels Additional Comments: 3 falls in past 6 months, fluctuating  mobility tolerance    Prior Function Level of Independence: Needs assistance      ADL's / Homemaking Assistance Needed: >50% of ADL assist from staff        Hand Dominance   Dominant Hand: Right     Extremity/Trunk Assessment   Upper Extremity Assessment Upper Extremity Assessment: Generalized weakness (LUE tremor per baseline)    Lower Extremity Assessment Lower Extremity Assessment: Generalized weakness       Communication      Cognition Arousal/Alertness: Awake/alert;Lethargic Behavior During Therapy: WFL for tasks assessed/performed Overall Cognitive Status: History of cognitive impairments - at baseline                                 General Comments: delayed response time, tangential response, generally confused at baseline, follows simple 1-step commands with occasional tactile facilitation      General Comments      Exercises     Assessment/Plan    PT Assessment Patient needs continued PT services  PT Problem List Decreased strength;Decreased range of motion;Decreased activity tolerance;Decreased balance;Decreased mobility;Decreased coordination;Decreased cognition;Decreased knowledge of use of DME;Decreased safety awareness;Decreased knowledge of precautions;Cardiopulmonary status limiting activity       PT Treatment Interventions DME instruction;Gait training;Functional mobility training;Therapeutic activities;Therapeutic exercise;Patient/family education;Cognitive remediation    PT Goals (Current goals can be found in the Care Plan section)  Acute Rehab PT Goals Patient Stated Goal: be able to return to [place], reduce falls. PT Goal Formulation: With family Time For Goal Achievement: 01/13/21 Potential to Achieve Goals: Good    Frequency Min 2X/week   Barriers to discharge        Co-evaluation               AM-PAC PT "6 Clicks" Mobility  Outcome Measure Help needed turning from your back to your side while in a flat bed without using bedrails?: A Lot Help needed moving from lying on your back to sitting on the side of a flat bed without using bedrails?: A Lot Help needed moving to and from a bed to a chair (including a  wheelchair)?: A Lot Help needed standing up from a chair using your arms (e.g., wheelchair or bedside chair)?: A Lot Help needed to walk in hospital room?: A Lot Help needed climbing 3-5 steps with a railing? : A Lot 6 Click Score: 12    End of Session Equipment Utilized During Treatment: Gait belt (axillary) Activity Tolerance: Patient tolerated treatment well;No increased pain Patient left: in bed;with call bell/phone within reach;with bed alarm set;with family/visitor present (standard fowler's position) Nurse Communication: Mobility status PT Visit Diagnosis: Difficulty in walking, not elsewhere classified (R26.2);Other abnormalities of gait and mobility (R26.89)    Time: 5697-9480 PT Time Calculation (min) (ACUTE ONLY): 26 min   Charges:   PT Evaluation $PT Eval Moderate Complexity: 1 Mod        10:08 AM, 12/30/20 Etta Grandchild, PT, DPT Physical Therapist - [facility]  [phone] ([place])    [place] C 12/30/2020, 10:01 AM

## 2020-12-31 DIAGNOSIS — N179 Acute kidney failure, unspecified: Secondary | ICD-10-CM | POA: Diagnosis not present

## 2020-12-31 DIAGNOSIS — S2242XA Multiple fractures of ribs, left side, initial encounter for closed fracture: Secondary | ICD-10-CM | POA: Diagnosis not present

## 2020-12-31 DIAGNOSIS — G9341 Metabolic encephalopathy: Secondary | ICD-10-CM | POA: Diagnosis not present

## 2020-12-31 LAB — BASIC METABOLIC PANEL
Anion gap: 4 — ABNORMAL LOW (ref 5–15)
BUN: 27 mg/dL — ABNORMAL HIGH (ref 8–23)
CO2: 21 mmol/L — ABNORMAL LOW (ref 22–32)
Calcium: 8.4 mg/dL — ABNORMAL LOW (ref 8.9–10.3)
Chloride: 109 mmol/L (ref 98–111)
Creatinine, Ser: 1.52 mg/dL — ABNORMAL HIGH (ref 0.44–1.00)
GFR, Estimated: 33 mL/min — ABNORMAL LOW (ref >60)
Glucose, Bld: 100 mg/dL — ABNORMAL HIGH (ref 70–99)
Potassium: 4.2 mmol/L (ref 3.5–5.1)
Sodium: 134 mmol/L — ABNORMAL LOW (ref 135–145)

## 2020-12-31 LAB — CBC
HCT: 27.8 % — ABNORMAL LOW (ref 36.0–46.0)
Hemoglobin: 9.2 g/dL — ABNORMAL LOW (ref 12.0–15.0)
MCH: 33.7 pg (ref 26.0–34.0)
MCHC: 33.1 g/dL (ref 30.0–36.0)
MCV: 101.8 fL — ABNORMAL HIGH (ref 80.0–100.0)
Platelets: 211 10*3/uL (ref 150–400)
RBC: 2.73 MIL/uL — ABNORMAL LOW (ref 3.87–5.11)
RDW: 12.8 % (ref 11.5–15.5)
WBC: 6.1 10*3/uL (ref 4.0–10.5)
nRBC: 0 % (ref 0.0–0.2)

## 2020-12-31 MED ORDER — MORPHINE SULFATE (PF) 2 MG/ML IV SOLN: 1.0000 mg | INTRAVENOUS | Status: DC | PRN

## 2020-12-31 NOTE — Progress Notes (Signed)
Physical Therapy Treatment Patient Details Name: Monique Burns MRN: [id number] DOB: 10/20/1930 Today's Date: 12/31/2020    History of Present Illness Monique Burns is an 76yoF who comes to [facility] on 12/29/20 s/p mechanical fall onto left side c left hip pain. PMH: Parkinson-like dementia c LUE tremor, HTN, CAD s/p PCI and stent x4, CABG, CHF, depression, OA, Rt temporal CVA, bilat THA. Imaging revealing of fracture to Lt ribs 10-12.    PT Comments    Pt in bed upon entry, DTR at bedside. Pt awake, pleasant and interactive, agreeable to session. minGuard/minA to EOB, minA to rise to standing 3 times (need help with anterior weight-shift), then AMB slowly and continuously around the unit without significant exertion or fatigue. Pt assisted to BR after AMB, able to make ~200-300cc urine (RN will measure). Pt assisted back to bed at EOS. DTR present throughout much of session. Author provides gait facilitation through RW push.       Follow Up Recommendations  Home health PT;Supervision for mobility/OOB     Equipment Recommendations  None recommended by PT    Recommendations for Other Services       Precautions / Restrictions Precautions Precautions: Fall Restrictions Weight Bearing Restrictions: No    Mobility  Bed Mobility Overal bed mobility: Needs Assistance Bed Mobility: Supine to Sit;Sit to Supine     Supine to sit: Min guard;HOB elevated Sit to supine: Min guard;HOB elevated        Transfers Overall transfer level: Needs assistance Equipment used: Rolling walker (2 wheeled);None Transfers: Sit to/from Stand Sit to Stand: Min assist         General transfer comment: diffculty with weight shift  A/P when transfering, remains posterior to BOS  Ambulation/Gait   Gait Distance (Feet): 200 Feet Assistive device: Rolling walker (2 wheeled)       General Gait Details: bradykinetic, tells me a story about seeing [name] in real life one time!, [name] provides  RW propulsion, particularly for turns. Stop in BR back into room, where pt is able tomakeurine   Stairs             Wheelchair Mobility    Modified Rankin (Stroke Patients Only)       Balance Overall balance assessment: History of Falls;Needs assistance                                          Cognition Arousal/Alertness: Awake/alert Behavior During Therapy: WFL for tasks assessed/performed Overall Cognitive Status: History of cognitive impairments - at baseline                                 General Comments: calm, flat affect, hypophonetic, occasionally very tangential in response, all fairly typical of baseline      Exercises      General Comments        Pertinent Vitals/Pain Pain Assessment: No/denies pain    Home Living                      Prior Function            PT Goals (current goals can now be found in the care plan section) Acute Rehab PT Goals Patient Stated Goal: be able to return to [name], reduce falls. PT Goal Formulation: With family Time  For Goal Achievement: 01/13/21 Potential to Achieve Goals: Good Progress towards PT goals: Progressing toward goals    Frequency    Min 2X/week      PT Plan Current plan remains appropriate    Co-evaluation              AM-PAC PT "6 Clicks" Mobility   Outcome Measure  Help needed turning from your back to your side while in a flat bed without using bedrails?: A Lot Help needed moving from lying on your back to sitting on the side of a flat bed without using bedrails?: A Lot Help needed moving to and from a bed to a chair (including a wheelchair)?: A Lot Help needed standing up from a chair using your arms (e.g., wheelchair or bedside chair)?: A Lot Help needed to walk in hospital room?: A Lot Help needed climbing 3-5 steps with a railing? : A Lot 6 Click Score: 12    End of Session Equipment Utilized During Treatment: Gait belt;Other  (comment) (axillary) Activity Tolerance: Patient tolerated treatment well;No increased pain Patient left: in bed;with call bell/phone within reach;with bed alarm set;with family/visitor present Nurse Communication: Mobility status PT Visit Diagnosis: Difficulty in walking, not elsewhere classified (R26.2);Other abnormalities of gait and mobility (R26.89)     Time: 1610-9604 PT Time Calculation (min) (ACUTE ONLY): 22 min  Charges:  $Therapeutic Activity: 8-22 mins                    4:30 PM, 12/31/20 Etta Grandchild, PT, DPT Physical Therapist - [facility]  [phone] ([place])     [place] 12/31/2020, 4:25 PM

## 2020-12-31 NOTE — TOC Progression Note (Addendum)
Transition of Care [facility]) - Progression Note    Patient Details  Name: Monique Burns MRN: [id number] Date of Birth: 1930/12/20  Transition of Care [facility]) CM/SW Rankin, RN Phone Number: 12/31/2020, 9:41 AM  Clinical Narrative:   Spoke with Sarah with [facility], They are not in network with the patient's insurance, she is going to check with Jenny Reichmann at [facility] to see if they can accept the patient and will let me know  [facility] will accept the patient, I notified her daughter [name]       Expected Discharge Plan and Services                                                 Social Determinants of Health (SDOH) Interventions    Readmission Risk Interventions No flowsheet data found.

## 2020-12-31 NOTE — Progress Notes (Signed)
PROGRESS NOTE    Monique Burns  LOV:[id number] DOB: [birth year] DOA: 12/29/2020 PCP: Adin Hector, MD    Assessment & Plan:   Active Problems:   Fall   Protein-calorie malnutrition, severe   Rib fractures: of left 10th, 11th and 12th ribs. Secondary to fall at home. Improved pain w/ oxycodone. Encourage incentive spirometry   Encephalopathy: etiology unclear, likely secondary to narcotic use vs UTI. Pt was recently on bactrim for UTI and pt's daughter is unsure if the pt finished the course or not. Urine cx ordered. Re-orient prn.  AKI on CKDIIIb: Cr is trending down daily. Will continue to monitor   HTN:  continue on home dose of metoprolol   HLD: continue on statin   Depression: severity unknown. Continue on home dose of duloxetine   Hx of CAD: s/p 4 stents & CABG. Continue on metoprolol, statin, aspirin & ranexa    DVT prophylaxis: lovenox Code Status: partial  Family Communication: discussed pt's care w/ pt's family at bedside and answered their questions Disposition Plan: likely d/c back home w/ HH   Level of care: Med-Surg  Status is: Observation  The patient remains OBS appropriate and will d/c before 2 midnights., very confused today, possible narcotic use vs UTI, urine cx ordered  Dispo: The patient is from: Home              Anticipated d/c is to: Home               Patient currently is not medically stable to d/c.   Difficult to place patient : unclear    Consultants:    Procedures:  Antimicrobials:   Subjective: Pt is confused and unable to answer questions appropriately today   Objective: Vitals:   12/30/20 1212 12/30/20 1611 12/30/20 2025 12/31/20 0614  BP: (!) 149/54 (!) 140/50 (!) 160/55 (!) 150/51  Pulse: (!) 57 60 63 62  Resp: 14 16 16 16   Temp: 98 F (36.7 C) 98 F (36.7 C) 98.4 F (36.9 C) 98.4 F (36.9 C)  TempSrc:   Oral   SpO2: 99% 98% 99% 100%  Weight:      Height:        Intake/Output Summary (Last 24 hours) at  12/31/2020 0741 Last data filed at 12/31/2020 0530 Gross per 24 hour  Intake 1251.26 ml  Output 1342 ml  Net -90.74 ml   Filed Weights   12/29/20 1922  Weight: 52.2 kg    Examination:  General exam: Appears confused Respiratory system: decreased breath sounds b/l  Cardiovascular system: S1/S2+. No rubs or clicks Gastrointestinal system: Abd is soft, NT,ND & hypoactive bowel sounds Central nervous system: Alert and oriented. Moves all extremities  Psychiatry: Judgement and insight appear abnormal    Data Reviewed: I have personally reviewed following labs and imaging studies  CBC: Recent Labs  Lab 12/29/20 1926 12/30/20 0439 12/31/20 0505  WBC 5.3 5.1 6.1  HGB 11.3* 9.8* 9.2*  HCT 33.0* 29.2* 27.8*  MCV 101.5* 100.0 101.8*  PLT 288 220 884   Basic Metabolic Panel: Recent Labs  Lab 12/29/20 1926 12/30/20 0439 12/31/20 0505  NA 135 135 134*  K 4.4 3.9 4.2  CL 101 105 109  CO2 27 22 21*  GLUCOSE 141* 95 100*  BUN 38* 33* 27*  CREATININE 1.99* 1.65* 1.52*  CALCIUM 9.2 8.5* 8.4*   GFR: Estimated Creatinine Clearance: 21.1 mL/min (A) (by C-G formula based on SCr of 1.52 mg/dL (H)). Liver Function Tests:  Recent Labs  Lab 12/29/20 1926  AST 40  ALT 28  ALKPHOS 61  BILITOT 0.8  PROT 7.8  ALBUMIN 4.6   No results for input(s): LIPASE, AMYLASE in the last 168 hours. No results for input(s): AMMONIA in the last 168 hours. Coagulation Profile: No results for input(s): INR, PROTIME in the last 168 hours. Cardiac Enzymes: No results for input(s): CKTOTAL, CKMB, CKMBINDEX, TROPONINI in the last 168 hours. BNP (last 3 results) No results for input(s): PROBNP in the last 8760 hours. HbA1C: No results for input(s): HGBA1C in the last 72 hours. CBG: No results for input(s): GLUCAP in the last 168 hours. Lipid Profile: No results for input(s): CHOL, HDL, LDLCALC, TRIG, CHOLHDL, LDLDIRECT in the last 72 hours. Thyroid Function Tests: No results for input(s):  TSH, T4TOTAL, FREET4, T3FREE, THYROIDAB in the last 72 hours. Anemia Panel: No results for input(s): VITAMINB12, FOLATE, FERRITIN, TIBC, IRON, RETICCTPCT in the last 72 hours. Sepsis Labs: No results for input(s): PROCALCITON, LATICACIDVEN in the last 168 hours.  Recent Results (from the past 240 hour(s))  Resp Panel by RT-PCR (Flu A&B, Covid) Nasopharyngeal Swab     Status: None   Collection Time: 12/29/20 10:32 PM   Specimen: Nasopharyngeal Swab; Nasopharyngeal(NP) swabs in vial transport medium  Result Value Ref Range Status   SARS Coronavirus 2 by RT PCR NEGATIVE NEGATIVE Final    Comment: (NOTE) SARS-CoV-2 target nucleic acids are NOT DETECTED.  The SARS-CoV-2 RNA is generally detectable in upper respiratory specimens during the acute phase of infection. The lowest concentration of SARS-CoV-2 viral copies this assay can detect is 138 copies/mL. A negative result does not preclude SARS-Cov-2 infection and should not be used as the sole basis for treatment or other patient management decisions. A negative result may occur with  improper specimen collection/handling, submission of specimen other than nasopharyngeal swab, presence of viral mutation(s) within the areas targeted by this assay, and inadequate number of viral copies(<138 copies/mL). A negative result must be combined with clinical observations, patient history, and epidemiological information. The expected result is Negative.  Fact Sheet for Patients:  EntrepreneurPulse.com.au  Fact Sheet for Healthcare Providers:  IncredibleEmployment.be  This test is no t yet approved or cleared by the [place] FDA and  has been authorized for detection and/or diagnosis of SARS-CoV-2 by FDA under an Emergency Use Authorization (EUA). This EUA will remain  in effect (meaning this test can be used) for the duration of the COVID-19 declaration under Section 564(b)(1) of the Act,  21 U.S.C.section 360bbb-3(b)(1), unless the authorization is terminated  or revoked sooner.       Influenza A by PCR NEGATIVE NEGATIVE Final   Influenza B by PCR NEGATIVE NEGATIVE Final    Comment: (NOTE) The Xpert Xpress SARS-CoV-2/FLU/RSV plus assay is intended as an aid in the diagnosis of influenza from Nasopharyngeal swab specimens and should not be used as a sole basis for treatment. Nasal washings and aspirates are unacceptable for Xpert Xpress SARS-CoV-2/FLU/RSV testing.  Fact Sheet for Patients: EntrepreneurPulse.com.au  Fact Sheet for Healthcare Providers: IncredibleEmployment.be  This test is not yet approved or cleared by the [place] FDA and has been authorized for detection and/or diagnosis of SARS-CoV-2 by FDA under an Emergency Use Authorization (EUA). This EUA will remain in effect (meaning this test can be used) for the duration of the COVID-19 declaration under Section 564(b)(1) of the Act, 21 U.S.C. section 360bbb-3(b)(1), unless the authorization is terminated or revoked.  Performed at [facility],  [place], [place] [address]          Radiology Studies: CT HEAD WO CONTRAST (5MM)  Result Date: 12/29/2020 CLINICAL DATA:  Fall EXAM: CT HEAD WITHOUT CONTRAST TECHNIQUE: Contiguous axial images were obtained from the base of the skull through the vertex without intravenous contrast. COMPARISON:  08/18/2020 FINDINGS: Brain: There is atrophy and chronic small vessel disease changes. No acute intracranial abnormality. Specifically, no hemorrhage, hydrocephalus, mass lesion, acute infarction, or significant intracranial injury. Vascular: No hyperdense vessel or unexpected calcification. Skull: No acute calvarial abnormality. Sinuses/Orbits: No acute findings Other: None IMPRESSION: Atrophy, chronic microvascular disease. No acute intracranial abnormality. Electronically Signed   By: Rolm Baptise M.D.    On: 12/29/2020 20:12   CT CHEST WO CONTRAST  Result Date: 12/29/2020 CLINICAL DATA:  Left-sided chest wall pain after a fall. Slip and fall injury. EXAM: CT CHEST WITHOUT CONTRAST TECHNIQUE: Multidetector CT imaging of the chest was performed following the standard protocol without IV contrast. COMPARISON:  CT 06/16/2020.  Chest 08/25/2020 FINDINGS: Cardiovascular: Normal heart size. No pericardial effusions. Postoperative coronary bypass. Calcification of the aorta and coronary arteries. No aortic aneurysm. Mediastinum/Nodes: No enlarged mediastinal or axillary lymph nodes. Thyroid gland, trachea, and esophagus demonstrate no significant findings. Lungs/Pleura: Nodular scarring in the lung apices, likely postinflammatory and unchanged since prior study. Mild atelectasis in the lung bases with band like opacities bilaterally. Also no change. No focal consolidation or airspace disease. No pleural effusions. No pneumothorax. Upper Abdomen: No acute abnormalities. Musculoskeletal: Postoperative median sternotomy. There is an acute appearing fracture of the left twelfth rib, incompletely included within the field of view. Acute mildly displaced fractures of the posterior left tenth and eleventh ribs as well. Degenerative changes in the spine. IMPRESSION: 1. Multiple acute left rib fractures.  No pneumothorax. 2. Chronic nodular scarring in the lungs. 3. Aortic atherosclerosis. Electronically Signed   By: Lucienne Capers M.D.   On: 12/29/2020 20:18   CT Cervical Spine Wo Contrast  Result Date: 12/29/2020 CLINICAL DATA:  Fall EXAM: CT CERVICAL SPINE WITHOUT CONTRAST TECHNIQUE: Multidetector CT imaging of the cervical spine was performed without intravenous contrast. Multiplanar CT image reconstructions were also generated. COMPARISON:  08/18/2020 FINDINGS: Alignment: No subluxation. Skull base and vertebrae: No acute fracture. No primary bone lesion or focal pathologic process. Soft tissues and spinal canal: No  prevertebral fluid or swelling. No visible canal hematoma. Disc levels: Diffuse degenerative disc disease with disc space narrowing and spurring, most pronounced at C5-6 and C6-7. Bilateral degenerative facet disease. Upper chest: Biapical scarring. Calcified granuloma in the left upper lobe. Other: None IMPRESSION: Degenerative disc and facet disease.  No acute bony abnormality. Electronically Signed   By: Rolm Baptise M.D.   On: 12/29/2020 20:13   DG HIP UNILAT WITH PELVIS 2-3 VIEWS LEFT  Result Date: 12/29/2020 CLINICAL DATA:  Fall, left hip pain EXAM: DG HIP (WITH OR WITHOUT PELVIS) 2-3V LEFT COMPARISON:  05/28/2020 FINDINGS: Bilateral hip replacements. No hardware complicating feature. No acute bony abnormality. Specifically, no fracture, subluxation, or dislocation. IMPRESSION: Bilateral hip replacement.  No acute bony abnormality. Electronically Signed   By: Rolm Baptise M.D.   On: 12/29/2020 20:15        Scheduled Meds:  aspirin EC  81 mg Oral Daily   atorvastatin  40 mg Oral Daily   DULoxetine  30 mg Oral Daily   enoxaparin (LOVENOX) injection  30 mg Subcutaneous Q24H   feeding supplement  1 Container Oral Q24H  feeding supplement  237 mL Oral BID BM   lamoTRIgine  25 mg Oral Daily   metoprolol succinate  25 mg Oral Daily   multivitamin with minerals  1 tablet Oral Daily   ranolazine  500 mg Oral BID   Continuous Infusions:  sodium chloride 75 mL/hr at 12/31/20 0103     LOS: 0 days    Time spent: 34 mins     Wyvonnia Dusky, MD [facility] Pager [phone]  If 7PM-7AM, please contact night-coverage 12/31/2020, 7:41 AM

## 2020-12-31 NOTE — Plan of Care (Signed)
Patient sleeping between care. Intermittent confusion overnight. Current pain regimen effective. Patient voiding spontaneously. Bed alarm on.  PLAN OF CARE ONGOING Problem: Education: Goal: Knowledge of General Education information will improve Description: Including pain rating scale, medication(s)/side effects and non-pharmacologic comfort measures Outcome: Progressing   Problem: Health Behavior/Discharge Planning: Goal: Ability to manage health-related needs will improve Outcome: Progressing   Problem: Clinical Measurements: Goal: Ability to maintain clinical measurements within normal limits will improve Outcome: Progressing Goal: Will remain free from infection Outcome: Progressing Goal: Diagnostic test results will improve Outcome: Progressing Goal: Respiratory complications will improve Outcome: Progressing Goal: Cardiovascular complication will be avoided Outcome: Progressing   Problem: Activity: Goal: Risk for activity intolerance will decrease Outcome: Progressing   Problem: Nutrition: Goal: Adequate nutrition will be maintained Outcome: Progressing   Problem: Coping: Goal: Level of anxiety will decrease Outcome: Progressing   Problem: Elimination: Goal: Will not experience complications related to bowel motility Outcome: Progressing Goal: Will not experience complications related to urinary retention Outcome: Progressing   Problem: Pain Managment: Goal: General experience of comfort will improve Outcome: Progressing   Problem: Safety: Goal: Ability to remain free from injury will improve Outcome: Progressing   Problem: Skin Integrity: Goal: Risk for impaired skin integrity will decrease Outcome: Progressing

## 2021-01-01 DIAGNOSIS — S2242XA Multiple fractures of ribs, left side, initial encounter for closed fracture: Secondary | ICD-10-CM | POA: Diagnosis not present

## 2021-01-01 DIAGNOSIS — N1832 Chronic kidney disease, stage 3b: Secondary | ICD-10-CM | POA: Diagnosis not present

## 2021-01-01 DIAGNOSIS — I10 Essential (primary) hypertension: Secondary | ICD-10-CM | POA: Diagnosis not present

## 2021-01-01 LAB — BASIC METABOLIC PANEL
Anion gap: 4 — ABNORMAL LOW (ref 5–15)
BUN: 24 mg/dL — ABNORMAL HIGH (ref 8–23)
CO2: 23 mmol/L (ref 22–32)
Calcium: 9.2 mg/dL (ref 8.9–10.3)
Chloride: 108 mmol/L (ref 98–111)
Creatinine, Ser: 1.42 mg/dL — ABNORMAL HIGH (ref 0.44–1.00)
GFR, Estimated: 36 mL/min — ABNORMAL LOW (ref >60)
Glucose, Bld: 112 mg/dL — ABNORMAL HIGH (ref 70–99)
Potassium: 4.3 mmol/L (ref 3.5–5.1)
Sodium: 135 mmol/L (ref 135–145)

## 2021-01-01 LAB — CBC
HCT: 32.6 % — ABNORMAL LOW (ref 36.0–46.0)
Hemoglobin: 10.5 g/dL — ABNORMAL LOW (ref 12.0–15.0)
MCH: 32.4 pg (ref 26.0–34.0)
MCHC: 32.2 g/dL (ref 30.0–36.0)
MCV: 100.6 fL — ABNORMAL HIGH (ref 80.0–100.0)
Platelets: 243 10*3/uL (ref 150–400)
RBC: 3.24 MIL/uL — ABNORMAL LOW (ref 3.87–5.11)
RDW: 12.6 % (ref 11.5–15.5)
WBC: 5.7 10*3/uL (ref 4.0–10.5)
nRBC: 0 % (ref 0.0–0.2)

## 2021-01-01 MED ORDER — OXYCODONE HCL 5 MG PO TABS: 5.0000 mg | ORAL_TABLET | Freq: Four times a day (QID) | ORAL | 0 refills | Status: DC | PRN

## 2021-01-01 MED ORDER — BISACODYL 10 MG RE SUPP
10.0000 mg | Freq: Every day | RECTAL | Status: DC | PRN
  Filled 2021-01-01: qty 1

## 2021-01-01 MED ORDER — OXYCODONE HCL 5 MG PO TABS: 5.0000 mg | ORAL_TABLET | Freq: Four times a day (QID) | ORAL | 0 refills | Status: AC | PRN

## 2021-01-01 MED ORDER — SULFAMETHOXAZOLE-TRIMETHOPRIM 800-160 MG PO TABS: 1.0000 | ORAL_TABLET | Freq: Two times a day (BID) | ORAL | 0 refills | Status: AC

## 2021-01-01 NOTE — Discharge Summary (Addendum)
Physician Discharge Summary  Monique Burns FVC:[id number] DOB: 05/22/1930 DOA: 12/29/2020  PCP: Monique Hector, MD  Admit date: 12/29/2020 Discharge date: 01/01/2021  Admitted From: home Disposition:  home w/ home health   Recommendations for Outpatient Follow-up:  Follow up with PCP in 1 week  Home Health: yes Equipment/Devices:  Discharge Condition: stable  CODE STATUS: partial  Diet recommendation: Heart Healthy  Brief/Interim Summary: HPI was taken from Dr. Sidney Ace: LESHONDA Burns is a 87 y.o. Caucasian female with medical history significant for hypertension, dyslipidemia, coronary artery disease status post PCI and 4 stents and CABG, CHF, depression and osteoarthritis, presented to the ER with acute onset of mechanical fall on her left side with subsequent left hip pain as well as left-sided chest pain.  The patient was in the bathroom brushing her teeth and she slipped on a moist floor beside the sink.  She reported no head injuries.  No headache or dizziness or blurred vision or presyncope or syncope prior to her fall.  No nausea or vomiting or abdominal pain.  No chest pain or palpitations.  No paresthesias or focal muscle weakness.   ED Course: Upon presentation to the ER blood pressure was 180/58 and later 149/60 with otherwise normal vital signs.  Labs revealed a BUN of 38 and creatinine 1.99 compared to 35/1.63 on 08/25/2020.  Hemoglobin and hematocrits were 11.3 and 33 comparable to previous levels.  Respiratory panel is currently pending. EKG as reviewed by me : EKG showed normal sinus rhythm with a rate of 67 with left atrial enlargement and LVH with secondary repolarization abnormality. Imaging: Noncontrast head CT scan showed atrophy and chronic microvascular disease with no acute intracranial normality.  C-spine CT shows degenerative disc and facet disease with no acute bony abnormalities.  Chest CT revealed multiple acute left rib fractures involving the left 10th 11th  and 12th ribs with no pneumothorax.  It showed chronic nodular scarring in the lower lungs and aortic atherosclerosis.  Hip x-ray showed bilateral hip replacement with no acute bony abnormality.  The patient was given 50 mcg of IV fentanyl and 50 mg of p.o. tramadol.  She was still having pain and not able to ambulate.  She will be admitted to an observation medical bed for further evaluation and management.  Hospital course from Dr. Jimmye Norman 8/25-8/27/22: Pt presented after a fall and was found to have left 10, 11, &12 rib fractures. Pt was treated w/ supportive care w/ pain meds prn and incentive spirometry. Of note, pt was intermittently confused likely secondary to narcotic use. Pt's daughter was made aware of this and still wanted the pt to receive narcotic pain meds as this helped the pt's rib cage pains. Urine cx was collected prior to d/c but was still pending at time of d/c. (Pt was recently treated from UTI w/ bactrim but pt's daughter was unsure if the pt finished the course or not). PT/OT evaluated the pt and recommended home health. HH was set up by CM prior to d/c. For more information, please see previous progress/consult notes.    Urine cx was positive for proteus. Cipro was called [facility]'s [facility]. Pt's daughter, Monique Burns, was called and notified of above on 01/03/21.   Discharge Diagnoses:  Active Problems:   Fall   Protein-calorie malnutrition, severe  Rib fractures: of left 10th, 11th and 12th ribs. Secondary to fall at home. Improved pain w/ oxycodone. Encourage incentive spirometry   Encephalopathy: etiology unclear, likely secondary  to narcotic use vs UTI.  Much improved today. Pt was recently on bactrim for UTI and pt's daughter is unsure if the pt finished the course or not. Urine cx pending.   AKI on CKDIIIb: Cr continues to trend down daily   HTN:  continue on home dose of metoprolol   HLD: continue on statin   Depression: severity unknown.  Continue on home dose of duloxetine   Hx of CAD: s/p 4 stents & CABG. Continue on metoprolol, statin, aspirin & ranexa  Discharge Instructions  Discharge Instructions     Diet - low sodium heart healthy   Complete by: As directed    Discharge instructions   Complete by: As directed    F/u w/ PCP in 1 week   Increase activity slowly   Complete by: As directed       Allergies as of 01/01/2021       Reactions   Brilinta [ticagrelor] Other (See Comments)   "caused stent to fail"   Imdur [isosorbide Nitrate] Other (See Comments)   Headache        Medication List     STOP taking these medications    amLODipine 2.5 MG tablet Commonly known as: NORVASC   ferrous sulfate 325 (65 FE) MG tablet   Gemtesa 75 MG Tabs Generic drug: Vibegron       TAKE these medications    acetaminophen 500 MG tablet Commonly known as: TYLENOL Take 1,000 mg by mouth every 6 (six) hours as needed. What changed: Another medication with the same name was removed. Continue taking this medication, and follow the directions you see here.   aspirin EC 81 MG tablet Take 81 mg by mouth daily.   atorvastatin 40 MG tablet Commonly known as: LIPITOR Take 40 mg by mouth every evening.   cycloSPORINE 0.05 % ophthalmic emulsion Commonly known as: RESTASIS Place 1 drop into both eyes 2 (two) times daily.   DULoxetine 30 MG capsule Commonly known as: CYMBALTA Take 30 mg by mouth daily.   guaiFENesin 100 MG/5ML liquid Commonly known as: ROBITUSSIN Take 200 mg by mouth every 6 (six) hours as needed for cough.   lamoTRIgine 25 MG tablet Commonly known as: LAMICTAL Take 25 mg by mouth daily.   lidocaine 5 % Commonly known as: LIDODERM Place 1 patch onto the skin daily. Remove & Discard patch within 12 hours or as directed by MD   metoprolol succinate 25 MG 24 hr tablet Commonly known as: TOPROL-XL Take 25 mg by mouth daily.   MULTIVITAMIN ADULT PO Take 1 tablet by mouth daily.    nitroGLYCERIN 0.4 MG SL tablet Commonly known as: Nitrostat Place 1 tablet (0.4 mg total) under the tongue once as needed for up to 1 dose for chest pain.   ondansetron 4 MG tablet Commonly known as: ZOFRAN Take 4 mg by mouth every 6 (six) hours as needed for nausea or vomiting.   oxyCODONE 5 MG immediate release tablet Commonly known as: Oxy IR/ROXICODONE Take 1 tablet (5 mg total) by mouth every 6 (six) hours as needed for up to 5 days for moderate pain or severe pain.   polyvinyl alcohol 1.4 % ophthalmic solution Commonly known as: LIQUIFILM TEARS Place 1 drop into both eyes 2 (two) times daily.   polyvinyl alcohol 1.4 % ophthalmic solution Commonly known as: LIQUIFILM TEARS Place 1 drop into both eyes as needed for dry eyes.   ranolazine 1000 MG SR tablet Commonly known as: RANEXA Take 1,000 mg  by mouth 2 (two) times daily.   sulfamethoxazole-trimethoprim 800-160 MG tablet Commonly known as: BACTRIM DS Take 1 tablet by mouth 2 (two) times daily for 7 days.   tolterodine 2 MG 24 hr capsule Commonly known as: DETROL LA Take 2 mg by mouth daily.        Allergies  Allergen Reactions   Brilinta [Ticagrelor] Other (See Comments)    "caused stent to fail"   Imdur [Isosorbide Nitrate] Other (See Comments)    Headache    Consultations:    Procedures/Studies: CT HEAD WO CONTRAST (5MM)  Result Date: 12/29/2020 CLINICAL DATA:  Fall EXAM: CT HEAD WITHOUT CONTRAST TECHNIQUE: Contiguous axial images were obtained from the base of the skull through the vertex without intravenous contrast. COMPARISON:  08/18/2020 FINDINGS: Brain: There is atrophy and chronic small vessel disease changes. No acute intracranial abnormality. Specifically, no hemorrhage, hydrocephalus, mass lesion, acute infarction, or significant intracranial injury. Vascular: No hyperdense vessel or unexpected calcification. Skull: No acute calvarial abnormality. Sinuses/Orbits: No acute findings Other: None  IMPRESSION: Atrophy, chronic microvascular disease. No acute intracranial abnormality. Electronically Signed   By: Rolm Baptise M.D.   On: 12/29/2020 20:12   CT CHEST WO CONTRAST  Result Date: 12/29/2020 CLINICAL DATA:  Left-sided chest wall pain after a fall. Slip and fall injury. EXAM: CT CHEST WITHOUT CONTRAST TECHNIQUE: Multidetector CT imaging of the chest was performed following the standard protocol without IV contrast. COMPARISON:  CT 06/16/2020.  Chest 08/25/2020 FINDINGS: Cardiovascular: Normal heart size. No pericardial effusions. Postoperative coronary bypass. Calcification of the aorta and coronary arteries. No aortic aneurysm. Mediastinum/Nodes: No enlarged mediastinal or axillary lymph nodes. Thyroid gland, trachea, and esophagus demonstrate no significant findings. Lungs/Pleura: Nodular scarring in the lung apices, likely postinflammatory and unchanged since prior study. Mild atelectasis in the lung bases with band like opacities bilaterally. Also no change. No focal consolidation or airspace disease. No pleural effusions. No pneumothorax. Upper Abdomen: No acute abnormalities. Musculoskeletal: Postoperative median sternotomy. There is an acute appearing fracture of the left twelfth rib, incompletely included within the field of view. Acute mildly displaced fractures of the posterior left tenth and eleventh ribs as well. Degenerative changes in the spine. IMPRESSION: 1. Multiple acute left rib fractures.  No pneumothorax. 2. Chronic nodular scarring in the lungs. 3. Aortic atherosclerosis. Electronically Signed   By: Lucienne Capers M.D.   On: 12/29/2020 20:18   CT Cervical Spine Wo Contrast  Result Date: 12/29/2020 CLINICAL DATA:  Fall EXAM: CT CERVICAL SPINE WITHOUT CONTRAST TECHNIQUE: Multidetector CT imaging of the cervical spine was performed without intravenous contrast. Multiplanar CT image reconstructions were also generated. COMPARISON:  08/18/2020 FINDINGS: Alignment: No  subluxation. Skull base and vertebrae: No acute fracture. No primary bone lesion or focal pathologic process. Soft tissues and spinal canal: No prevertebral fluid or swelling. No visible canal hematoma. Disc levels: Diffuse degenerative disc disease with disc space narrowing and spurring, most pronounced at C5-6 and C6-7. Bilateral degenerative facet disease. Upper chest: Biapical scarring. Calcified granuloma in the left upper lobe. Other: None IMPRESSION: Degenerative disc and facet disease.  No acute bony abnormality. Electronically Signed   By: Rolm Baptise M.D.   On: 12/29/2020 20:13   DG HIP UNILAT WITH PELVIS 2-3 VIEWS LEFT  Result Date: 12/29/2020 CLINICAL DATA:  Fall, left hip pain EXAM: DG HIP (WITH OR WITHOUT PELVIS) 2-3V LEFT COMPARISON:  05/28/2020 FINDINGS: Bilateral hip replacements. No hardware complicating feature. No acute bony abnormality. Specifically, no fracture, subluxation, or dislocation. IMPRESSION:  Bilateral hip replacement.  No acute bony abnormality. Electronically Signed   By: Rolm Baptise M.D.   On: 12/29/2020 20:15   (Echo, Carotid, EGD, Colonoscopy, ERCP)    Subjective: Pt c/o fatigue    Discharge Exam: Vitals:   01/01/21 0734 01/01/21 1150  BP: (!) 169/71 (!) 158/53  Pulse: 61 (!) 58  Resp: 18 18  Temp: 98.6 F (37 C)   SpO2: 99% 100%   Vitals:   12/31/20 2329 01/01/21 0429 01/01/21 0734 01/01/21 1150  BP: (!) 119/54 (!) 164/69 (!) 169/71 (!) 158/53  Pulse: 67 (!) 55 61 (!) 58  Resp: 14 16 18 18   Temp: 98.1 F (36.7 C) (!) 97.5 F (36.4 C) 98.6 F (37 C)   TempSrc:      SpO2: 97% 99% 99% 100%  Weight:      Height:        General: Pt is alert, awake, not in acute distress. Frail appearing  Cardiovascular: S1/S2 +, no rubs, no gallops Respiratory: decreased breath sounds b/l otherwise clear Abdominal: Soft, NT, ND, bowel sounds + Extremities: no edema, no cyanosis    The results of significant diagnostics from this hospitalization (including  imaging, microbiology, ancillary and laboratory) are listed below for reference.     Microbiology: Recent Results (from the past 240 hour(s))  Resp Panel by RT-PCR (Flu A&B, Covid) Nasopharyngeal Swab     Status: None   Collection Time: 12/29/20 10:32 PM   Specimen: Nasopharyngeal Swab; Nasopharyngeal(NP) swabs in vial transport medium  Result Value Ref Range Status   SARS Coronavirus 2 by RT PCR NEGATIVE NEGATIVE Final    Comment: (NOTE) SARS-CoV-2 target nucleic acids are NOT DETECTED.  The SARS-CoV-2 RNA is generally detectable in upper respiratory specimens during the acute phase of infection. The lowest concentration of SARS-CoV-2 viral copies this assay can detect is 138 copies/mL. A negative result does not preclude SARS-Cov-2 infection and should not be used as the sole basis for treatment or other patient management decisions. A negative result may occur with  improper specimen collection/handling, submission of specimen other than nasopharyngeal swab, presence of viral mutation(s) within the areas targeted by this assay, and inadequate number of viral copies(<138 copies/mL). A negative result must be combined with clinical observations, patient history, and epidemiological information. The expected result is Negative.  Fact Sheet for Patients:  EntrepreneurPulse.com.au  Fact Sheet for Healthcare Providers:  IncredibleEmployment.be  This test is no t yet approved or cleared by the [place] FDA and  has been authorized for detection and/or diagnosis of SARS-CoV-2 by FDA under an Emergency Use Authorization (EUA). This EUA will remain  in effect (meaning this test can be used) for the duration of the COVID-19 declaration under Section 564(b)(1) of the Act, 21 U.S.C.section 360bbb-3(b)(1), unless the authorization is terminated  or revoked sooner.       Influenza A by PCR NEGATIVE NEGATIVE Final   Influenza B by PCR NEGATIVE  NEGATIVE Final    Comment: (NOTE) The Xpert Xpress SARS-CoV-2/FLU/RSV plus assay is intended as an aid in the diagnosis of influenza from Nasopharyngeal swab specimens and should not be used as a sole basis for treatment. Nasal washings and aspirates are unacceptable for Xpert Xpress SARS-CoV-2/FLU/RSV testing.  Fact Sheet for Patients: EntrepreneurPulse.com.au  Fact Sheet for Healthcare Providers: IncredibleEmployment.be  This test is not yet approved or cleared by the [place] FDA and has been authorized for detection and/or diagnosis of SARS-CoV-2 by FDA under an Emergency Use Authorization (  EUA). This EUA will remain in effect (meaning this test can be used) for the duration of the COVID-19 declaration under Section 564(b)(1) of the Act, 21 U.S.C. section 360bbb-3(b)(1), unless the authorization is terminated or revoked.  Performed at [facility], [address] [place], [place] [address]      Labs: BNP (last 3 results) No results for input(s): BNP in the last 8760 hours. Basic Metabolic Panel: Recent Labs  Lab 12/29/20 1926 12/30/20 0439 12/31/20 0505 01/01/21 0452  NA 135 135 134* 135  K 4.4 3.9 4.2 4.3  CL 101 105 109 108  CO2 27 22 21* 23  GLUCOSE 141* 95 100* 112*  BUN 38* 33* 27* 24*  CREATININE 1.99* 1.65* 1.52* 1.42*  CALCIUM 9.2 8.5* 8.4* 9.2   Liver Function Tests: Recent Labs  Lab 12/29/20 1926  AST 40  ALT 28  ALKPHOS 61  BILITOT 0.8  PROT 7.8  ALBUMIN 4.6   No results for input(s): LIPASE, AMYLASE in the last 168 hours. No results for input(s): AMMONIA in the last 168 hours. CBC: Recent Labs  Lab 12/29/20 1926 12/30/20 0439 12/31/20 0505 01/01/21 0452  WBC 5.3 5.1 6.1 5.7  HGB 11.3* 9.8* 9.2* 10.5*  HCT 33.0* 29.2* 27.8* 32.6*  MCV 101.5* 100.0 101.8* 100.6*  PLT 288 220 211 243   Cardiac Enzymes: No results for input(s): CKTOTAL, CKMB, CKMBINDEX, TROPONINI in the last 168  hours. BNP: Invalid input(s): POCBNP CBG: No results for input(s): GLUCAP in the last 168 hours. D-Dimer No results for input(s): DDIMER in the last 72 hours. Hgb A1c No results for input(s): HGBA1C in the last 72 hours. Lipid Profile No results for input(s): CHOL, HDL, LDLCALC, TRIG, CHOLHDL, LDLDIRECT in the last 72 hours. Thyroid function studies No results for input(s): TSH, T4TOTAL, T3FREE, THYROIDAB in the last 72 hours.  Invalid input(s): FREET3 Anemia work up No results for input(s): VITAMINB12, FOLATE, FERRITIN, TIBC, IRON, RETICCTPCT in the last 72 hours. Urinalysis    Component Value Date/Time   COLORURINE AMBER (A) 08/25/2020 1715   APPEARANCEUR HAZY (A) 08/25/2020 1715   APPEARANCEUR Hazy 03/16/2014 1156   LABSPEC 1.019 08/25/2020 1715   LABSPEC 1.018 03/16/2014 1156   PHURINE 5.0 08/25/2020 1715   GLUCOSEU NEGATIVE 08/25/2020 1715   GLUCOSEU Negative 03/16/2014 1156   HGBUR NEGATIVE 08/25/2020 1715   BILIRUBINUR NEGATIVE 08/25/2020 1715   BILIRUBINUR Negative 03/16/2014 1156   KETONESUR 5 (A) 08/25/2020 1715   PROTEINUR NEGATIVE 08/25/2020 1715   NITRITE NEGATIVE 08/25/2020 Standard 08/25/2020 1715   LEUKOCYTESUR Trace 03/16/2014 1156   Sepsis Labs Invalid input(s): PROCALCITONIN,  WBC,  LACTICIDVEN Microbiology Recent Results (from the past 240 hour(s))  Resp Panel by RT-PCR (Flu A&B, Covid) Nasopharyngeal Swab     Status: None   Collection Time: 12/29/20 10:32 PM   Specimen: Nasopharyngeal Swab; Nasopharyngeal(NP) swabs in vial transport medium  Result Value Ref Range Status   SARS Coronavirus 2 by RT PCR NEGATIVE NEGATIVE Final    Comment: (NOTE) SARS-CoV-2 target nucleic acids are NOT DETECTED.  The SARS-CoV-2 RNA is generally detectable in upper respiratory specimens during the acute phase of infection. The lowest concentration of SARS-CoV-2 viral copies this assay can detect is 138 copies/mL. A negative result does not  preclude SARS-Cov-2 infection and should not be used as the sole basis for treatment or other patient management decisions. A negative result may occur with  improper specimen collection/handling, submission of specimen other than nasopharyngeal swab,  presence of viral mutation(s) within the areas targeted by this assay, and inadequate number of viral copies(<138 copies/mL). A negative result must be combined with clinical observations, patient history, and epidemiological information. The expected result is Negative.  Fact Sheet for Patients:  EntrepreneurPulse.com.au  Fact Sheet for Healthcare Providers:  IncredibleEmployment.be  This test is no t yet approved or cleared by the [place] FDA and  has been authorized for detection and/or diagnosis of SARS-CoV-2 by FDA under an Emergency Use Authorization (EUA). This EUA will remain  in effect (meaning this test can be used) for the duration of the COVID-19 declaration under Section 564(b)(1) of the Act, 21 U.S.C.section 360bbb-3(b)(1), unless the authorization is terminated  or revoked sooner.       Influenza A by PCR NEGATIVE NEGATIVE Final   Influenza B by PCR NEGATIVE NEGATIVE Final    Comment: (NOTE) The Xpert Xpress SARS-CoV-2/FLU/RSV plus assay is intended as an aid in the diagnosis of influenza from Nasopharyngeal swab specimens and should not be used as a sole basis for treatment. Nasal washings and aspirates are unacceptable for Xpert Xpress SARS-CoV-2/FLU/RSV testing.  Fact Sheet for Patients: EntrepreneurPulse.com.au  Fact Sheet for Healthcare Providers: IncredibleEmployment.be  This test is not yet approved or cleared by the [place] FDA and has been authorized for detection and/or diagnosis of SARS-CoV-2 by FDA under an Emergency Use Authorization (EUA). This EUA will remain in effect (meaning this test can be used) for the  duration of the COVID-19 declaration under Section 564(b)(1) of the Act, 21 U.S.C. section 360bbb-3(b)(1), unless the authorization is terminated or revoked.  Performed at [facility], [address] [place], [place] [address]      Time coordinating discharge: Over 30 minutes  SIGNED:   Wyvonnia Dusky, MD  [facility] 01/01/2021, 11:59 AM Pager   If 7PM-7AM, please contact night-coverage

## 2021-01-01 NOTE — Progress Notes (Signed)
Monique Burns NT successfully removed pt IV cath in rm 144 for her NT+3 check off.

## 2021-01-01 NOTE — TOC Transition Note (Addendum)
Transition of Care [facility]) - CM/SW Discharge Note   Patient Details  Name: KRISINDA GIOVANNI MRN: [id number] Date of Birth: 1930/11/17  Transition of [facility]) CM/SW Contact:  Izola Price, RN Phone Number: 01/01/2021, 2:47 PM   Clinical Narrative:  Patient has discharge orders back to [facility]. CM arranged for [facility] services by [facility] on 12/31/20. Contacted ALF via [facility] at [phone]. Faxed DC Summary to [phone]. Wynelle Beckmann, daughter, communicated with throughout the day and aware of transfer. Needs transport via Non-emergent EMS per provider.   Forms printed to Unit RN for transport. Simmie Davies RN CM   [facility] arranged for transport. Communicated with provider and Unit nurse. Simmie Davies RN CM   Final next level of care: Assisted Living Barriers to Discharge: Barriers Resolved   Patient Goals and CMS Choice        Discharge Placement                  Name of family member notified: Daughter Wynelle Beckmann Patient and family notified of of transfer: 01/01/21  Discharge Plan and Services                            Council: [facility] (Arranged by prior CM.)        Social Determinants of Health (SDOH) Interventions     Readmission Risk Interventions No flowsheet data found.

## 2021-01-01 NOTE — Progress Notes (Signed)
VSS, IV CATH removed site c/d/I gave supp before leaving. Pt transported via stretcher by EMS services back to facility [facility]. Daughter packed up all belongings to meet mother at facility.

## 2021-01-03 LAB — URINE CULTURE: Culture: >=100000 — AB

## 2021-02-25 ENCOUNTER — Other Ambulatory Visit: Payer: Self-pay | Admitting: Obstetrics and Gynecology

## 2021-02-25 DIAGNOSIS — N83201 Unspecified ovarian cyst, right side: Secondary | ICD-10-CM

## 2021-03-08 ENCOUNTER — Ambulatory Visit: Admission: RE | Admit: 2021-03-08 | Payer: Medicare Other | Source: Ambulatory Visit

## 2021-03-18 ENCOUNTER — Ambulatory Visit
Admission: RE | Admit: 2021-03-18 | Discharge: 2021-03-18 | Disposition: A | Discharge status: Home / Self Care | Payer: Medicare Other | Source: Ambulatory Visit | Attending: Obstetrics and Gynecology | Admitting: Obstetrics and Gynecology

## 2021-03-18 DIAGNOSIS — N83201 Unspecified ovarian cyst, right side: Secondary | ICD-10-CM | POA: Diagnosis not present

## 2021-04-18 ENCOUNTER — Other Ambulatory Visit: Payer: Self-pay

## 2021-04-18 ENCOUNTER — Emergency Department: Payer: Medicare Other

## 2021-04-18 ENCOUNTER — Encounter: Payer: Self-pay | Admitting: Emergency Medicine

## 2021-04-18 ENCOUNTER — Inpatient Hospital Stay
Admission: EM | Admit: 2021-04-18 | Discharge: 2021-04-25 | DRG: 083 | Disposition: A | Discharge status: Skilled Nursing Facility | Payer: Medicare Other | Source: Skilled Nursing Facility | Attending: Internal Medicine | Admitting: Internal Medicine

## 2021-04-18 DIAGNOSIS — Z85828 Personal history of other malignant neoplasm of skin: Secondary | ICD-10-CM

## 2021-04-18 DIAGNOSIS — Z79899 Other long term (current) drug therapy: Secondary | ICD-10-CM

## 2021-04-18 DIAGNOSIS — I130 Hypertensive heart and chronic kidney disease with heart failure and stage 1 through stage 4 chronic kidney disease, or unspecified chronic kidney disease: Secondary | ICD-10-CM | POA: Diagnosis present

## 2021-04-18 DIAGNOSIS — I2510 Atherosclerotic heart disease of native coronary artery without angina pectoris: Secondary | ICD-10-CM | POA: Diagnosis present

## 2021-04-18 DIAGNOSIS — E440 Moderate protein-calorie malnutrition: Secondary | ICD-10-CM | POA: Diagnosis present

## 2021-04-18 DIAGNOSIS — I619 Nontraumatic intracerebral hemorrhage, unspecified: Secondary | ICD-10-CM | POA: Diagnosis present

## 2021-04-18 DIAGNOSIS — K219 Gastro-esophageal reflux disease without esophagitis: Secondary | ICD-10-CM | POA: Diagnosis present

## 2021-04-18 DIAGNOSIS — Z66 Do not resuscitate: Secondary | ICD-10-CM | POA: Diagnosis not present

## 2021-04-18 DIAGNOSIS — N184 Chronic kidney disease, stage 4 (severe): Secondary | ICD-10-CM | POA: Diagnosis present

## 2021-04-18 DIAGNOSIS — E785 Hyperlipidemia, unspecified: Secondary | ICD-10-CM | POA: Diagnosis present

## 2021-04-18 DIAGNOSIS — E039 Hypothyroidism, unspecified: Secondary | ICD-10-CM | POA: Diagnosis present

## 2021-04-18 DIAGNOSIS — Z951 Presence of aortocoronary bypass graft: Secondary | ICD-10-CM | POA: Diagnosis not present

## 2021-04-18 DIAGNOSIS — Z9181 History of falling: Secondary | ICD-10-CM

## 2021-04-18 DIAGNOSIS — Z7189 Other specified counseling: Secondary | ICD-10-CM | POA: Diagnosis not present

## 2021-04-18 DIAGNOSIS — S61512A Laceration without foreign body of left wrist, initial encounter: Secondary | ICD-10-CM | POA: Diagnosis present

## 2021-04-18 DIAGNOSIS — W010XXA Fall on same level from slipping, tripping and stumbling without subsequent striking against object, initial encounter: Secondary | ICD-10-CM | POA: Diagnosis present

## 2021-04-18 DIAGNOSIS — R296 Repeated falls: Secondary | ICD-10-CM

## 2021-04-18 DIAGNOSIS — Z20822 Contact with and (suspected) exposure to covid-19: Secondary | ICD-10-CM | POA: Diagnosis present

## 2021-04-18 DIAGNOSIS — Y9301 Activity, walking, marching and hiking: Secondary | ICD-10-CM | POA: Diagnosis present

## 2021-04-18 DIAGNOSIS — M1990 Unspecified osteoarthritis, unspecified site: Secondary | ICD-10-CM | POA: Diagnosis present

## 2021-04-18 DIAGNOSIS — Z681 Body mass index (BMI) 19 or less, adult: Secondary | ICD-10-CM

## 2021-04-18 DIAGNOSIS — I5042 Chronic combined systolic (congestive) and diastolic (congestive) heart failure: Secondary | ICD-10-CM | POA: Diagnosis present

## 2021-04-18 DIAGNOSIS — R627 Adult failure to thrive: Secondary | ICD-10-CM | POA: Diagnosis present

## 2021-04-18 DIAGNOSIS — Z7982 Long term (current) use of aspirin: Secondary | ICD-10-CM

## 2021-04-18 DIAGNOSIS — F0390 Unspecified dementia without behavioral disturbance: Secondary | ICD-10-CM | POA: Diagnosis present

## 2021-04-18 DIAGNOSIS — Z808 Family history of malignant neoplasm of other organs or systems: Secondary | ICD-10-CM

## 2021-04-18 DIAGNOSIS — Z955 Presence of coronary angioplasty implant and graft: Secondary | ICD-10-CM

## 2021-04-18 DIAGNOSIS — Y92091 Bathroom in other non-institutional residence as the place of occurrence of the external cause: Secondary | ICD-10-CM

## 2021-04-18 DIAGNOSIS — I252 Old myocardial infarction: Secondary | ICD-10-CM

## 2021-04-18 DIAGNOSIS — S63502A Unspecified sprain of left wrist, initial encounter: Secondary | ICD-10-CM | POA: Diagnosis present

## 2021-04-18 DIAGNOSIS — Z823 Family history of stroke: Secondary | ICD-10-CM

## 2021-04-18 DIAGNOSIS — S6992XA Unspecified injury of left wrist, hand and finger(s), initial encounter: Secondary | ICD-10-CM

## 2021-04-18 DIAGNOSIS — M24532 Contracture, left wrist: Secondary | ICD-10-CM | POA: Diagnosis present

## 2021-04-18 DIAGNOSIS — Z90710 Acquired absence of both cervix and uterus: Secondary | ICD-10-CM

## 2021-04-18 DIAGNOSIS — Z515 Encounter for palliative care: Secondary | ICD-10-CM | POA: Diagnosis not present

## 2021-04-18 DIAGNOSIS — Z803 Family history of malignant neoplasm of breast: Secondary | ICD-10-CM

## 2021-04-18 DIAGNOSIS — M4854XD Collapsed vertebra, not elsewhere classified, thoracic region, subsequent encounter for fracture with routine healing: Secondary | ICD-10-CM | POA: Diagnosis present

## 2021-04-18 DIAGNOSIS — S0635AA Traumatic hemorrhage of left cerebrum with loss of consciousness status unknown, initial encounter: Principal | ICD-10-CM | POA: Diagnosis present

## 2021-04-18 DIAGNOSIS — G8929 Other chronic pain: Secondary | ICD-10-CM | POA: Diagnosis present

## 2021-04-18 LAB — CBC WITH DIFFERENTIAL/PLATELET
Abs Immature Granulocytes: 0.06 10*3/uL (ref 0.00–0.07)
Basophils Absolute: 0 10*3/uL (ref 0.0–0.1)
Basophils Relative: 0 %
Eosinophils Absolute: 0.1 10*3/uL (ref 0.0–0.5)
Eosinophils Relative: 1 %
HCT: 34.9 % — ABNORMAL LOW (ref 36.0–46.0)
Hemoglobin: 11.1 g/dL — ABNORMAL LOW (ref 12.0–15.0)
Immature Granulocytes: 1 %
Lymphocytes Relative: 18 %
Lymphs Abs: 1.3 10*3/uL (ref 0.7–4.0)
MCH: 31.6 pg (ref 26.0–34.0)
MCHC: 31.8 g/dL (ref 30.0–36.0)
MCV: 99.4 fL (ref 80.0–100.0)
Monocytes Absolute: 0.6 10*3/uL (ref 0.1–1.0)
Monocytes Relative: 9 %
Neutro Abs: 5.1 10*3/uL (ref 1.7–7.7)
Neutrophils Relative %: 71 %
Platelets: 247 10*3/uL (ref 150–400)
RBC: 3.51 MIL/uL — ABNORMAL LOW (ref 3.87–5.11)
RDW: 13.2 % (ref 11.5–15.5)
WBC: 7.1 10*3/uL (ref 4.0–10.5)
nRBC: 0 % (ref 0.0–0.2)

## 2021-04-18 LAB — COMPREHENSIVE METABOLIC PANEL
ALT: 16 U/L (ref 0–44)
AST: 29 U/L (ref 15–41)
Albumin: 4.2 g/dL (ref 3.5–5.0)
Alkaline Phosphatase: 122 U/L (ref 38–126)
Anion gap: 7 (ref 5–15)
BUN: 37 mg/dL — ABNORMAL HIGH (ref 8–23)
CO2: 23 mmol/L (ref 22–32)
Calcium: 9.5 mg/dL (ref 8.9–10.3)
Chloride: 105 mmol/L (ref 98–111)
Creatinine, Ser: 1.42 mg/dL — ABNORMAL HIGH (ref 0.44–1.00)
GFR, Estimated: 36 mL/min — ABNORMAL LOW (ref >60)
Glucose, Bld: 114 mg/dL — ABNORMAL HIGH (ref 70–99)
Potassium: 4 mmol/L (ref 3.5–5.1)
Sodium: 135 mmol/L (ref 135–145)
Total Bilirubin: 0.9 mg/dL (ref 0.3–1.2)
Total Protein: 7.4 g/dL (ref 6.5–8.1)

## 2021-04-18 LAB — RESP PANEL BY RT-PCR (FLU A&B, COVID) ARPGX2
Influenza A by PCR: NEGATIVE
Influenza B by PCR: NEGATIVE
SARS Coronavirus 2 by RT PCR: NEGATIVE

## 2021-04-18 LAB — URINALYSIS, COMPLETE (UACMP) WITH MICROSCOPIC
Bacteria, UA: NONE SEEN
Bilirubin Urine: NEGATIVE
Glucose, UA: NEGATIVE mg/dL
Hgb urine dipstick: NEGATIVE
Ketones, ur: NEGATIVE mg/dL
Nitrite: NEGATIVE
Protein, ur: NEGATIVE mg/dL
Specific Gravity, Urine: 1.02 (ref 1.005–1.030)
pH: 5 (ref 5.0–8.0)

## 2021-04-18 MED ORDER — ADULT MULTIVITAMIN W/MINERALS CH
1.0000 | ORAL_TABLET | Freq: Every day | ORAL | Status: DC
  Administered 2021-04-18 – 2021-04-25 (×8): 1 via ORAL
  Filled 2021-04-18 (×8): qty 1

## 2021-04-18 MED ORDER — CEFAZOLIN SODIUM-DEXTROSE 2-4 GM/100ML-% IV SOLN: 2.0000 g | INTRAVENOUS | Status: DC

## 2021-04-18 MED ORDER — CYCLOSPORINE 0.05 % OP EMUL
1.0000 [drp] | Freq: Two times a day (BID) | OPHTHALMIC | Status: DC
Start: 2021-04-18 — End: 2021-04-25
  Administered 2021-04-18 – 2021-04-25 (×15): 1 [drp] via OPHTHALMIC
  Filled 2021-04-18 (×16): qty 30

## 2021-04-18 MED ORDER — ONDANSETRON HCL 4 MG/2ML IJ SOLN: 4.0000 mg | Freq: Four times a day (QID) | INTRAMUSCULAR | Status: DC | PRN

## 2021-04-18 MED ORDER — SODIUM CHLORIDE 0.9% FLUSH
3.0000 mL | INTRAVENOUS | Status: DC | PRN
  Administered 2021-04-21: 3 mL via INTRAVENOUS

## 2021-04-18 MED ORDER — LAMOTRIGINE ER 50 MG PO TB24: 50.0000 mg | ORAL_TABLET | Freq: Every day | ORAL | Status: DC

## 2021-04-18 MED ORDER — METOPROLOL SUCCINATE ER 25 MG PO TB24
25.0000 mg | ORAL_TABLET | Freq: Every day | ORAL | Status: DC
  Administered 2021-04-18 – 2021-04-24 (×7): 25 mg via ORAL
  Filled 2021-04-18 (×8): qty 1

## 2021-04-18 MED ORDER — LIDOCAINE 5 % EX PTCH
1.0000 | MEDICATED_PATCH | CUTANEOUS | Status: DC
Start: 2021-04-18 — End: 2021-04-25
  Administered 2021-04-18 – 2021-04-24 (×7): 1 via TRANSDERMAL
  Filled 2021-04-18 (×8): qty 1

## 2021-04-18 MED ORDER — LABETALOL HCL 5 MG/ML IV SOLN
10.0000 mg | Freq: Once | INTRAVENOUS | Status: DC
  Filled 2021-04-18: qty 4

## 2021-04-18 MED ORDER — POLYVINYL ALCOHOL 1.4 % OP SOLN
1.0000 [drp] | Freq: Two times a day (BID) | OPHTHALMIC | Status: DC
Start: 2021-04-19 — End: 2021-04-25
  Administered 2021-04-19 – 2021-04-25 (×13): 1 [drp] via OPHTHALMIC
  Filled 2021-04-18: qty 15

## 2021-04-18 MED ORDER — LEVETIRACETAM IN NACL 500 MG/100ML IV SOLN
500.0000 mg | Freq: Two times a day (BID) | INTRAVENOUS | Status: DC
  Administered 2021-04-18 – 2021-04-20 (×5): 500 mg via INTRAVENOUS
  Filled 2021-04-18 (×5): qty 100

## 2021-04-18 MED ORDER — FESOTERODINE FUMARATE ER 4 MG PO TB24
4.0000 mg | ORAL_TABLET | Freq: Every day | ORAL | Status: DC
  Administered 2021-04-18 – 2021-04-25 (×8): 4 mg via ORAL
  Filled 2021-04-18 (×8): qty 1

## 2021-04-18 MED ORDER — SODIUM CHLORIDE 0.9% FLUSH
3.0000 mL | Freq: Two times a day (BID) | INTRAVENOUS | Status: DC
  Administered 2021-04-18 – 2021-04-25 (×13): 3 mL via INTRAVENOUS

## 2021-04-18 MED ORDER — LIDOCAINE-EPINEPHRINE 2 %-1:100000 IJ SOLN
20.0000 mL | Freq: Once | INTRAMUSCULAR | Status: AC
  Administered 2021-04-18: 20 mL via INTRADERMAL
  Filled 2021-04-18: qty 1

## 2021-04-18 MED ORDER — SODIUM CHLORIDE 0.9 % IV SOLN: 250.0000 mL | INTRAVENOUS | Status: DC | PRN

## 2021-04-18 MED ORDER — POLYETHYLENE GLYCOL 3350 17 G PO PACK
17.0000 g | PACK | Freq: Every day | ORAL | Status: DC
  Administered 2021-04-18 – 2021-04-25 (×7): 17 g via ORAL
  Filled 2021-04-18 (×8): qty 1

## 2021-04-18 MED ORDER — ATORVASTATIN CALCIUM 20 MG PO TABS
40.0000 mg | ORAL_TABLET | Freq: Every day | ORAL | Status: DC
  Administered 2021-04-18 – 2021-04-24 (×7): 40 mg via ORAL
  Filled 2021-04-18 (×7): qty 2

## 2021-04-18 MED ORDER — NITROGLYCERIN 0.4 MG SL SUBL: 0.4000 mg | SUBLINGUAL_TABLET | Freq: Once | SUBLINGUAL | Status: DC | PRN

## 2021-04-18 MED ORDER — LAMOTRIGINE 25 MG PO TABS
25.0000 mg | ORAL_TABLET | Freq: Two times a day (BID) | ORAL | Status: DC
  Administered 2021-04-18 – 2021-04-25 (×15): 25 mg via ORAL
  Filled 2021-04-18 (×15): qty 1

## 2021-04-18 MED ORDER — ONDANSETRON HCL 4 MG PO TABS: 4.0000 mg | ORAL_TABLET | Freq: Four times a day (QID) | ORAL | Status: DC | PRN

## 2021-04-18 MED ORDER — SODIUM CHLORIDE 0.9 % IV SOLN
2.0000 g | Freq: Once | INTRAVENOUS | Status: AC
  Administered 2021-04-18: 2 g via INTRAVENOUS
  Filled 2021-04-18: qty 20

## 2021-04-18 MED ORDER — ACETAMINOPHEN 500 MG PO TABS
500.0000 mg | ORAL_TABLET | Freq: Four times a day (QID) | ORAL | Status: DC | PRN
  Administered 2021-04-18 – 2021-04-25 (×3): 500 mg via ORAL
  Filled 2021-04-18 (×3): qty 1

## 2021-04-18 MED ORDER — RANOLAZINE ER 500 MG PO TB12
1000.0000 mg | ORAL_TABLET | Freq: Two times a day (BID) | ORAL | Status: DC
  Administered 2021-04-18 – 2021-04-19 (×3): 1000 mg via ORAL
  Filled 2021-04-18 (×4): qty 2

## 2021-04-18 NOTE — ED Triage Notes (Signed)
Pt to ED via [facility] from [facility] with c/o fall and possible fractured wrist with possible skin tear/poss open fracture. Pt with dressing applied by EMS PTA. Per EMS pt with some disorient\ation noted en route, asking for mother who has been dead.

## 2021-04-18 NOTE — Consult Note (Signed)
I have placed a request via Secure Chat to Dr. Francine Graven requesting photos of the wound areas of concern to be placed in the EMR.    [place], [place], [place]

## 2021-04-18 NOTE — ED Notes (Signed)
EDP at bedside to assess wound

## 2021-04-18 NOTE — ED Provider Notes (Signed)
[facility]  ____________________________________________   Event Date/Time   First MD Initiated Contact with Patient 04/18/21 [phone]     (approximate)  I have reviewed the triage vital signs and the nursing notes.   HISTORY  Chief Complaint Fall and Wrist Pain    HPI Monique Burns is a 87 y.o. female with past medical history of MI status post CABG in 2017, CHF, hypertension, hyperlipidemia presents with a left wrist injury.  Patient was walking in the bathroom and slipped on water.  She sustained a [place] injury to the left wrist.  She says she did not hit her head.  Denies pain other than her wrist.  She does have some chronic neck pain that is slightly worse but denies any new numbness or weakness.  She is not on blood thinners.  Patient says her last tetanus was in the last 5 years.         Past Medical History:  Diagnosis Date   Anemia    Arthritis    Blood dyscrasia    since being put on Brilinta after stent placement developing hematoma   Cancer ([place])    basal cell left leg   CHF (congestive heart failure) (HCC)    GERD (gastroesophageal reflux disease)    Headache    h/o migraines   Hyperlipidemia    Hypertension    Leukopenia    MI (myocardial infarction) ([place]) 02/2015   s/p 4 stents and CABG in 2017   Renal disorder    stage 3-Dr Caryl Comes keeping a check on kidney function   Shortness of breath dyspnea    mild compared to prior stenting-pt states she would have to stop and rest if walking a mile   Thyroid disease     Patient Active Problem List   Diagnosis Date Noted   Protein-calorie malnutrition, severe 12/30/2020   Fall 12/29/2020   Debility 06/16/2020   Left hip pain    Lobar pneumonia ([place])    Acute metabolic encephalopathy    Falls frequently    Elevated d-dimer    Anemia    CAP (community acquired pneumonia) 05/28/2020   Overactive bladder 04/08/2020   Urge incontinence 04/08/2020   Nocturia 04/08/2020   Unstable  angina ([place]) 12/20/2017   Protein malnutrition ([place]) 11/27/2015   Chest pain 11/26/2015   Chronic kidney disease 04/05/2015   HLD (hyperlipidemia) 04/05/2015   Essential hypertension 04/05/2015   Decreased leukocytes 04/05/2015   Arthritis, degenerative 04/05/2015   Disease of thyroid gland 04/05/2015   Acute myocardial infarction of anterior wall ([place]) 02/28/2015   Degenerative arthritis of hip 10/31/2013    Past Surgical History:  Procedure Laterality Date   ABDOMINAL HYSTERECTOMY     CARDIAC CATHETERIZATION N/A 12/08/2015   Procedure: Left Heart Cath and Coronary Angiography;  Surgeon: Yolonda Kida, MD;  Location: [facility];  Service: Cardiovascular;  Laterality: N/A;   CORONARY ANGIOPLASTY WITH STENT PLACEMENT     CORONARY STENT PLACEMENT  2016   x2   EYE SURGERY Bilateral 2011   INCISION AND DRAINAGE Left 08/26/2015   Procedure: INCISION AND DRAINAGE / HAND HEMATOMA;  Surgeon: Hessie Knows, MD;  Location: [facility];  Service: Orthopedics;  Laterality: Left;   JOINT REPLACEMENT Bilateral    left 2016 and right 2015   TONSILLECTOMY     3rd grade    Prior to Admission medications   Medication Sig Start Date End Date Taking? Authorizing Provider  acetaminophen (TYLENOL) 500 MG  tablet Take 1,000 mg by mouth every 6 (six) hours as needed.    [provider]  aspirin EC 81 MG tablet Take 81 mg by mouth daily.    [provider]  atorvastatin (LIPITOR) 40 MG tablet Take 40 mg by mouth every evening.    [provider]  cycloSPORINE (RESTASIS) 0.05 % ophthalmic emulsion Place 1 drop into both eyes 2 (two) times daily.     [provider]  DULoxetine (CYMBALTA) 30 MG capsule Take 30 mg by mouth daily.    [provider]  guaiFENesin (ROBITUSSIN) 100 MG/5ML liquid Take 200 mg by mouth every 6 (six) hours as needed for cough.    [provider]  lamoTRIgine (LAMICTAL) 25 MG tablet Take 25 mg by mouth daily.    [provider]  lidocaine (LIDODERM) 5 % Place 1 patch onto the skin daily. Remove & Discard patch within 12 hours or as directed by MD 06/01/20   Nolberto Hanlon, MD  metoprolol succinate (TOPROL-XL) 25 MG 24 hr tablet Take 25 mg by mouth daily. 05/03/20   [provider]  Multiple Vitamin (MULTIVITAMIN ADULT PO) Take 1 tablet by mouth daily.    [provider]  nitroGLYCERIN (NITROSTAT) 0.4 MG SL tablet Place 1 tablet (0.4 mg total) under the tongue once as needed for up to 1 dose for chest pain. 06/20/20   Rudene Re, MD  ondansetron [facility]) 4 MG tablet Take 4 mg by mouth every 6 (six) hours as needed for nausea or vomiting. 08/05/20   [provider]  polyvinyl alcohol (LIQUIFILM TEARS) 1.4 % ophthalmic solution Place 1 drop into both eyes 2 (two) times daily.    [provider]  polyvinyl alcohol (LIQUIFILM TEARS) 1.4 % ophthalmic solution Place 1 drop into both eyes as needed for dry eyes.    [provider]  ranolazine (RANEXA) 1000 MG SR tablet Take 1,000 mg by mouth 2 (two) times daily. 11/30/17   [provider]  tolterodine (DETROL LA) 2 MG 24 hr capsule Take 2 mg by mouth daily.    [provider]    Allergies Brilinta [ticagrelor] and Imdur [isosorbide nitrate]  Family History  Problem Relation Age of Onset   Breast cancer Paternal Grandmother    Brain cancer Mother    Stroke Father    Brain cancer Son     Social History Social History   Tobacco Use   Smoking status: Never   Smokeless tobacco: Never  Vaping Use   Vaping Use: Never used  Substance Use Topics   Alcohol use: No   Drug use: No    Review of Systems   Review of Systems  Musculoskeletal:  Positive for arthralgias, joint swelling and myalgias.  Skin:  Positive for wound.  Neurological:  Negative for weakness, numbness and headaches.  All other systems reviewed and are negative.  Physical Exam Updated Vital Signs BP (!) 174/66 (BP  Location: Right Arm)   Pulse 74   Resp 20   Ht 5\' 7"  (1.702 m)   Wt 52.2 kg   SpO2 98%   BMI 18.02 kg/m   Physical Exam Vitals and nursing note reviewed.  Constitutional:      General: She is not in acute distress.    Appearance: Normal appearance.     Comments: Patient is very thin and frail appearing  HENT:     Head: Normocephalic and atraumatic.  Eyes:     General: No scleral icterus.  Conjunctiva/sclera: Conjunctivae normal.  Pulmonary:     Effort: Pulmonary effort is normal. No respiratory distress.     Breath sounds: No stridor.  Musculoskeletal:        General: No deformity or signs of injury.     Cervical back: Normal range of motion.     Comments: Left wrist is held in flexion with obvious soft tissue swelling and deformity, there is significant devitalized skin on the dorsal aspect of the wrist with a large amount of swelling, no obvious exposed bone 2+ radial pulse Patient able to move each finger but fingers are held in extension and overlapping positions  No obvious soft tissue swelling or bony tenderness of the forearm elbow humerus or shoulder  Skin:    General: Skin is dry.     Coloration: Skin is not jaundiced or pale.  Neurological:     General: No focal deficit present.     Mental Status: She is alert and oriented to person, place, and time. Mental status is at baseline.  Psychiatric:        Mood and Affect: Mood normal.        Behavior: Behavior normal.     LABS (all labs ordered are listed, but only abnormal results are displayed)  Labs Reviewed  RESP PANEL BY RT-PCR (FLU A&B, COVID) ARPGX2  COMPREHENSIVE METABOLIC PANEL  CBC WITH DIFFERENTIAL/PLATELET  URINALYSIS, COMPLETE (UACMP) WITH MICROSCOPIC   ____________________________________________  EKG ____________________________________________  RADIOLOGY Almeta Monas, personally viewed and evaluated these images (plain radiographs) as part of my medical decision making, as well  as reviewing the written report by the radiologist.  ED MD interpretation: I reviewed the x-ray of the left wrist which shows no obvious injury to the distal ulna or radius but significant overlapping of the carpal bones, questionable wrist dislocation    ____________________________________________   PROCEDURES  Procedure(s) performed (including Critical Care):  Procedures   ____________________________________________   INITIAL IMPRESSION / ASSESSMENT AND PLAN / ED COURSE   Patient is a 87 year old female presenting with an obvious wrist injury after a fall.  Patient slipped in the bathroom on water sustaining mechanical fall.  She tells me she did not hit her head but is having some neck pain.  She has obvious deformity of the left wrist which is held in flexion with a large area of devitalized skin, question open fracture.  Good radial pulse but her fingers are held in extension and overlapping positions although she is able to move each of them.  At the time of signout she is pending her x-rays and CT head and C-spine.  We will place an IV and give Ancef for presumed open fracture and check basic labs that she will likely need admission versus observation for this injury.     ____________________________________________   FINAL CLINICAL IMPRESSION(S) / ED DIAGNOSES  Final diagnoses:  Injury of left wrist, initial encounter     ED Discharge Orders     None        Note:  This document was prepared using Dragon voice recognition software and may include unintentional dictation errors.    Rada Hay, MD 04/18/21 [phone]

## 2021-04-18 NOTE — TOC Initial Note (Signed)
Transition of Care [facility]) - Initial/Assessment Note    Patient Details  Name: Monique Burns MRN: [id number] Date of Birth: 05-Jul-1930  [facility]) CM/SW Contact:    Shelbie Hutching, RN Phone Number: 04/18/2021, 3:32 PM  Clinical Narrative:                 Patient being admitted to the hospital with acute infarct and wrist injury after a fall.  Patient is from [facility].  Patient walks with a walker but has been having a lot of falls recently.   RNCM met with patient at the bedside, she is confused.  RNCM spoke with patient's daughter, Monique Burns, via phone.  Monique Burns is in agreement with short term rehab if recommended.  She is wondering if patient may actually need long term skilled nursing home care rather than ALF.  RNCM explained that we can work on short term rehab here and see how she does at SNF, if needs long term care they have social workers that can assist with that transition.  She would be private pay until all her assets were spent down.    PT eval pending.  TOC will follow and assist with disposition.    Expected Discharge Plan: Skilled Nursing Facility Barriers to Discharge: Continued Medical Work up   Patient Goals and CMS Choice Patient states their goals for this hospitalization and ongoing recovery are:: Patient wants to be able to go back home , daughter agrees with SNF with the possiblity of maybe needing long term skilled care CMS Medicare.gov Compare Post Acute Care list provided to:: Patient Represenative (must comment) Choice offered to / list presented to : Adult Children  Expected Discharge Plan and Services Expected Discharge Plan: [place]   Discharge Planning Services: CM Consult Post Acute Care Choice: [place] Living arrangements for the past 2 months: [place]                 DME Arranged: N/A DME Agency: NA       HH Arranged: NA HH Agency: NA        Prior Living  Arrangements/Services Living arrangements for the past 2 months: [place] Lives with:: Facility Resident Patient language and need for interpreter reviewed:: Yes Do you feel safe going back to the place where you live?: Yes      Need for Family Participation in Patient Care: Yes (Comment) (frequent falls) Care giver support system in place?: Yes (comment) (daughter) Current home services: DME (walker) Criminal Activity/Legal Involvement Pertinent to Current Situation/Hospitalization: No - Comment as needed  Activities of Daily Living      Permission Sought/Granted Permission sought to share information with : Case Manager, Family Supports, Customer service manager Permission granted to share information with : Yes, Verbal Permission Granted  Share Information with NAME: Monique Burns  Permission granted to share info w AGENCY: [facility] ALF and SNF's  Permission granted to share info w Relationship: daughter  Permission granted to share info w Contact Information: [phone]  Emotional Assessment Appearance:: Appears stated age Attitude/Demeanor/Rapport: Engaged Affect (typically observed): Accepting (confused) Orientation: : Oriented to Self, Oriented to Situation Alcohol / Substance Use: Not Applicable Psych Involvement: No (comment)  Admission diagnosis:  Frequent falls [R29.6] Patient Active Problem List   Diagnosis Date Noted   Frequent falls 04/18/2021   Brain bleed ([place]) 04/18/2021   Left wrist sprain 04/18/2021   CAD (coronary artery disease) 04/18/2021   GERD (gastroesophageal reflux  disease)    Protein-calorie malnutrition, severe 12/30/2020   Fall 12/29/2020   Debility 06/16/2020   Left hip pain    Lobar pneumonia ([place])    Acute metabolic encephalopathy    Falls frequently    Elevated d-dimer    Anemia    CAP (community acquired pneumonia) 05/28/2020   Overactive bladder 04/08/2020   Urge incontinence 04/08/2020   Nocturia  04/08/2020   Unstable angina ([place]) 12/20/2017   Protein malnutrition ([place]) 11/27/2015   Chest pain 11/26/2015   Chronic kidney disease 04/05/2015   HLD (hyperlipidemia) 04/05/2015   Essential hypertension 04/05/2015   Decreased leukocytes 04/05/2015   Arthritis, degenerative 04/05/2015   Disease of thyroid gland 04/05/2015   Acute myocardial infarction of anterior wall ([place]) 02/28/2015   Degenerative arthritis of hip 10/31/2013   PCP:  Adin Hector, MD Pharmacy:   [place], [place] - [address] [place] [address] Phone: [phone] Fax: [phone]     Social Determinants of Health (SDOH) Interventions    Readmission Risk Interventions No flowsheet data found.

## 2021-04-18 NOTE — ED Notes (Signed)
RN unable to get wrist brace on at this time. MD aware

## 2021-04-18 NOTE — ED Provider Notes (Signed)
..  Laceration Repair  Date/Time: 04/18/2021 2:54 PM Performed by: Duffy Bruce, MD Authorized by: Duffy Bruce, MD   Consent:    Consent obtained:  Verbal   Consent given by:  Patient   Risks discussed:  Infection, need for additional repair, pain, tendon damage, retained foreign body, vascular damage, poor cosmetic result, poor wound healing and nerve damage   Alternatives discussed:  Referral and delayed treatment Universal protocol:    Patient identity confirmed:  Verbally with patient Anesthesia:    Anesthesia method:  Local infiltration   Local anesthetic:  Lidocaine 1% WITH epi Laceration details:    Location: Right wrist. Pre-procedure details:    Preparation:  Patient was prepped and draped in usual sterile fashion and imaging obtained to evaluate for foreign bodies Exploration:    Hemostasis achieved with:  Direct pressure   Wound exploration: wound explored through full range of motion   Treatment:    Area cleansed with:  Betadine   Amount of cleaning:  Extensive   Irrigation solution:  Sterile water   Irrigation method:  Pressure wash Skin repair:    Repair method:  Sutures   Suture size:  4-0   Suture material:  Prolene   Suture technique:  Simple interrupted   Number of sutures:  9 Approximation:    Approximation:  Close Repair type:    Repair type:  Intermediate Post-procedure details:    Dressing:  Non-adherent dressing   Procedure completion:  Tolerated well, no immediate complications Comments:     Steristrips used to bolster skin integrity. Skin repositioned as best as able. Pt had a moderate amount of remaining skin tear which was dressed with non stick dressing.   87 year old female here with wrist pain after fall.  CT head reviewed, shows temporal intraparenchymal hemorrhage.  Discussed with Dr. Lacinda Axon of neurosurgery, will start the patient on prophylactic Keppra and plan to admit for 24-hour observation.  Regarding her wrist, plain film x-ray  shows no fracture.  I am able to extend the wrist.  Of note, she apparently has had increasing contracture of the left upper extremity for the last several weeks for which she has seen neurology and is being worked up.  I suspect this is contributing to the apparent deformity which was made worse with contusion.  Patient has a very large skin avulsion with exposed tissue.  I have high concern about the integrity of skin here.  The wound was thoroughly cleaned and patient will be placed on empiric antibiotics.  Steri-Strips were used to help bolster skin integrity and the skin was opposed as well as able, with nonstick overlying dressing.  Will need wound care.  Placed in a skin splint to support the laceration while healing.   Duffy Bruce, MD 04/18/21 [phone]

## 2021-04-18 NOTE — H&P (Signed)
History and Physical    Monique Burns:[id number] DOB: [birth year] DOA: 04/18/2021  PCP: Adin Hector, MD   Patient coming from: Assisted living facility  I have personally briefly reviewed patient's old medical records in [place]  Chief Complaint: Fall  HPI: Monique Burns is a 87 y.o. female with medical history significant for CHF, GERD, hypertension, coronary artery disease, stage IV chronic kidney disease, hypothyroidism who presents to the ER via EMS for evaluation following a fall.  Patient was noted to have significant skin tear involving the left hand and per EMS concerns for a fractured left wrist. Patient has a history of frequent falls and usually ambulates with a rolling walker.  She fell while trying to get to the bathroom and daughter is unsure if she had had her rolling walker. Patient states that she felt dizzy prior to the fall but is unable to tell me if she hit her head or lost consciousness. She denies having any nausea, no vomiting, no abdominal pain, no changes in her bowel habits, no urinary frequency nocturia, no dysuria, no fever, no cough, no leg swelling no focal deficit or blurred vision. Sodium 135, potassium 4.0, chloride 105, bicarb 23, glucose 114, BUN 37, creatinine 1.42, calcium 9.5, alkaline phosphatase 122, albumin 4.2, AST 29, ALT 16, total protein 7.4, white count 7.1, hemoglobin 11.1, hematocrit 34.9, MCV 99.4, RDW 13.2, platelet count 247 Respiratory viral panel is negative CT scan of the head/cervical spine without contrast shows a small 4 mm acute intraparenchymal hemorrhage in the left temporal lobe.  No appreciable surrounding edema or mass-effect.  Similar chronic microvascular ischemic disease and atrophic.  T1 compression fracture with 20 to 30% height loss which appears unchanged from August, 2022.  No evidence of new/interval fracture or traumatic malalignment.  Similar multilevel degenerative change. Left elbow x-ray shows no  acute findings Left wrist x-ray prominent dorsal soft tissue laceration.  No evidence of acute fracture. Twelve-lead EKG reviewed by me shows sinus rhythm.   ED Course: Patient is an 87 year old female who presents to the ER via EMS after a fall and is found to have a small intraparenchymal hemorrhage in the left temporal lobe as well as skin tear involving the left wrist. She would be admitted to the hospital for further evaluation.   Review of Systems: As per HPI otherwise all other systems reviewed and negative.    Past Medical History:  Diagnosis Date   Anemia    Arthritis    Blood dyscrasia    since being put on Brilinta after stent placement developing hematoma   Cancer ([place])    basal cell left leg   CHF (congestive heart failure) (HCC)    GERD (gastroesophageal reflux disease)    Headache    h/o migraines   Hyperlipidemia    Hypertension    Leukopenia    MI (myocardial infarction) ([place]) 02/2015   s/p 4 stents and CABG in 2017   Renal disorder    stage 3-Dr Caryl Comes keeping a check on kidney function   Shortness of breath dyspnea    mild compared to prior stenting-pt states she would have to stop and rest if walking a mile   Thyroid disease     Past Surgical History:  Procedure Laterality Date   ABDOMINAL HYSTERECTOMY     CARDIAC CATHETERIZATION N/A 12/08/2015   Procedure: Left Heart Cath and Coronary Angiography;  Surgeon: Yolonda Kida, MD;  Location: [facility];  Service:  Cardiovascular;  Laterality: N/A;   CORONARY ANGIOPLASTY WITH STENT PLACEMENT     CORONARY STENT PLACEMENT  2016   x2   EYE SURGERY Bilateral 2011   INCISION AND DRAINAGE Left 08/26/2015   Procedure: INCISION AND DRAINAGE / HAND HEMATOMA;  Surgeon: Hessie Knows, MD;  Location: [facility];  Service: Orthopedics;  Laterality: Left;   JOINT REPLACEMENT Bilateral    left 2016 and right 2015   TONSILLECTOMY     3rd grade     reports that she has never smoked. She has never used  smokeless tobacco. She reports that she does not drink alcohol and does not use drugs.  Allergies  Allergen Reactions   Brilinta [Ticagrelor] Other (See Comments)    "caused stent to fail"   Imdur [Isosorbide Nitrate] Other (See Comments)    Headache    Family History  Problem Relation Age of Onset   Breast cancer Paternal 70    Brain cancer Mother    Stroke Father    Brain cancer Son       Prior to Admission medications   Medication Sig Start Date End Date Taking? Authorizing Provider  aspirin EC 81 MG tablet Take 81 mg by mouth daily.   Yes [provider]  atorvastatin (LIPITOR) 40 MG tablet Take 40 mg by mouth at bedtime.   Yes [provider]  DULoxetine (CYMBALTA) 30 MG capsule Take 30 mg by mouth daily.   Yes [provider]  lamoTRIgine (LAMICTAL XR) 50 MG 24 hour tablet Take 50 mg by mouth daily.   Yes [provider]  acetaminophen (TYLENOL) 500 MG tablet Take 1,000 mg by mouth every 6 (six) hours as needed.    [provider]  cycloSPORINE (RESTASIS) 0.05 % ophthalmic emulsion Place 1 drop into both eyes 2 (two) times daily.     [provider]  guaiFENesin (ROBITUSSIN) 100 MG/5ML liquid Take 200 mg by mouth every 6 (six) hours as needed for cough.    [provider]  lidocaine (LIDODERM) 5 % Place 1 patch onto the skin daily. Remove & Discard patch within 12 hours or as directed by MD 06/01/20   Nolberto Hanlon, MD  metoprolol succinate (TOPROL-XL) 25 MG 24 hr tablet Take 25 mg by mouth daily. 05/03/20   [provider]  Multiple Vitamin (MULTIVITAMIN ADULT PO) Take 1 tablet by mouth daily.    [provider]  nitroGLYCERIN (NITROSTAT) 0.4 MG SL tablet Place 1 tablet (0.4 mg total) under the tongue once as needed for up to 1 dose for chest pain. 06/20/20   Rudene Re, MD  ondansetron [facility]) 4 MG tablet Take 4 mg by mouth every 6 (six) hours as needed for nausea or vomiting.  08/05/20   [provider]  polyvinyl alcohol (LIQUIFILM TEARS) 1.4 % ophthalmic solution Place 1 drop into both eyes 2 (two) times daily.    [provider]  polyvinyl alcohol (LIQUIFILM TEARS) 1.4 % ophthalmic solution Place 1 drop into both eyes as needed for dry eyes.    [provider]  ranolazine (RANEXA) 1000 MG SR tablet Take 1,000 mg by mouth 2 (two) times daily. 11/30/17   [provider]  tolterodine (DETROL LA) 2 MG 24 hr capsule Take 2 mg by mouth daily.    [provider]    Physical Exam: Vitals:   04/18/21 0630 04/18/21 0635 04/18/21 0925 04/18/21 1024  BP:  (!) 174/66  (!) 128/45  Pulse:  74 71 Marland Kitchen)  57  Resp:  20 13 17   SpO2:  98% 99% 99%  Weight: 52.2 kg     Height: 5\' 7"  (1.702 m)        Vitals:   04/18/21 0630 04/18/21 0635 04/18/21 0925 04/18/21 1024  BP:  (!) 174/66  (!) 128/45  Pulse:  74 71 (!) 57  Resp:  20 13 17   SpO2:  98% 99% 99%  Weight: 52.2 kg     Height: 5\' 7"  (1.702 m)         Constitutional: Alert and oriented x 2 . Not in any apparent distress.  Thin and frail.  Bruising over the bridge of the nose HEENT:      Head: Normocephalic and atraumatic.         Eyes: PERLA, EOMI, Conjunctivae are normal. Sclera is non-icteric.       Mouth/Throat: Mucous membranes are moist.       Neck: Supple with no signs of meningismus. Cardiovascular: Regular rate and rhythm. No murmurs, gallops, or rubs. 2+ symmetrical distal pulses are present . No JVD. No LE edema Respiratory: Respiratory effort normal .Lungs sounds clear bilaterally. No wheezes, crackles, or rhonchi.  Gastrointestinal: Soft, tender in the left lower quadrant, and non distended with positive bowel sounds.  Genitourinary: No CVA tenderness. Musculoskeletal: Deformity involving the left wrist with dressing over left wrist.   Neurologic:  Face is symmetric. Moving all extremities. No gross focal neurologic deficits . Skin: Skin is warm, dry.  No rash or  ulcers Psychiatric: Mood and affect are normal    Labs on Admission: I have personally reviewed following labs and imaging studies  CBC: Recent Labs  Lab 04/18/21 0758  WBC 7.1  NEUTROABS 5.1  HGB 11.1*  HCT 34.9*  MCV 99.4  PLT 371   Basic Metabolic Panel: Recent Labs  Lab 04/18/21 0758  NA 135  K 4.0  CL 105  CO2 23  GLUCOSE 114*  BUN 37*  CREATININE 1.42*  CALCIUM 9.5   GFR: Estimated Creatinine Clearance: 22.6 mL/min (A) (by C-G formula based on SCr of 1.42 mg/dL (H)). Liver Function Tests: Recent Labs  Lab 04/18/21 0758  AST 29  ALT 16  ALKPHOS 122  BILITOT 0.9  PROT 7.4  ALBUMIN 4.2   No results for input(s): LIPASE, AMYLASE in the last 168 hours. No results for input(s): AMMONIA in the last 168 hours. Coagulation Profile: No results for input(s): INR, PROTIME in the last 168 hours. Cardiac Enzymes: No results for input(s): CKTOTAL, CKMB, CKMBINDEX, TROPONINI in the last 168 hours. BNP (last 3 results) No results for input(s): PROBNP in the last 8760 hours. HbA1C: No results for input(s): HGBA1C in the last 72 hours. CBG: No results for input(s): GLUCAP in the last 168 hours. Lipid Profile: No results for input(s): CHOL, HDL, LDLCALC, TRIG, CHOLHDL, LDLDIRECT in the last 72 hours. Thyroid Function Tests: No results for input(s): TSH, T4TOTAL, FREET4, T3FREE, THYROIDAB in the last 72 hours. Anemia Panel: No results for input(s): VITAMINB12, FOLATE, FERRITIN, TIBC, IRON, RETICCTPCT in the last 72 hours. Urine analysis:    Component Value Date/Time   COLORURINE YELLOW (A) 04/18/2021 1041   APPEARANCEUR CLEAR (A) 04/18/2021 1041   APPEARANCEUR Hazy 03/16/2014 1156   LABSPEC 1.020 04/18/2021 1041   LABSPEC 1.018 03/16/2014 1156   PHURINE 5.0 04/18/2021 1041   GLUCOSEU NEGATIVE 04/18/2021 1041   GLUCOSEU Negative 03/16/2014 1156   HGBUR NEGATIVE 04/18/2021 [place] 04/18/2021 1041   BILIRUBINUR  Negative 03/16/2014 [place] 04/18/2021 [place] 04/18/2021 1041   NITRITE NEGATIVE 04/18/2021 1041   LEUKOCYTESUR SMALL (A) 04/18/2021 1041   LEUKOCYTESUR Trace 03/16/2014 1156    Radiological Exams on Admission: DG Elbow 2 Views Left  Result Date: 04/18/2021 CLINICAL DATA:  Fall.  Left elbow pain. EXAM: LEFT ELBOW - 2 VIEW COMPARISON:  None. FINDINGS: There is no evidence of fracture, dislocation, or joint effusion. There is no evidence of arthropathy or other focal bone abnormality. Generalized osteopenia noted. Soft tissues are unremarkable. IMPRESSION: No acute findings. Osteopenia. Electronically Signed   By: Marlaine Hind M.D.   On: 04/18/2021 07:58   DG Wrist Complete Left  Result Date: 04/18/2021 CLINICAL DATA:  Fall.  Left wrist pain and deformity. EXAM: LEFT WRIST - COMPLETE 3+ VIEW COMPARISON:  None. FINDINGS: There is no evidence of fracture or dislocation. There is no evidence of arthropathy or other focal bone abnormality. Generalized osteopenia is noted. Prominent dorsal soft tissue swelling and laceration is seen, however no radiopaque foreign body identified. Peripheral vascular calcification also noted. IMPRESSION: Prominent dorsal soft tissue and laceration. No evidence of acute fracture or radiopaque foreign body. Electronically Signed   By: Marlaine Hind M.D.   On: 04/18/2021 07:55   CT HEAD WO CONTRAST (5MM)  Result Date: 04/18/2021 CLINICAL DATA:  Head trauma, minor (Age >= 65y); Neck trauma (Age >= 65y) EXAM: CT HEAD WITHOUT CONTRAST CT CERVICAL SPINE WITHOUT CONTRAST TECHNIQUE: Multidetector CT imaging of the head and cervical spine was performed following the standard protocol without intravenous contrast. Multiplanar CT image reconstructions of the cervical spine were also generated. COMPARISON:  Multiple priors, most recent 12/29/2020. FINDINGS: CT HEAD FINDINGS Brain: Small 4 x 4 x 4 mm hyperdense intraparenchymal hemorrhage within the left temporal lobe  (series 3, image 14). No appreciable surrounding edema or mass effect. No evidence of acute large vascular territory infarct, hydrocephalus, mass lesion, midline shift, or visible extra-axial fluid collection. Similar patchy white matter hypoattenuation, nonspecific but compatible with chronic microvascular ischemic disease. Similar chronic atrophy with ex vacuo ventricular dilation, particularly the right temporal lobe and temporal horn of the right lateral ventricle. Vascular: No hyperdense vessel identified. Calcific intracranial atherosclerosis. Skull: No evidence of acute fracture. Sinuses/Orbits: Visualized sinuses are clear.  Unremarkable orbits. Other: No mastoid effusions. CT CERVICAL SPINE FINDINGS Alignment: Unchanged alignment. Similar anterolisthesis of C3 on C4, C4 on C5, and T1 on T2. Mild broad dextrocurvature. Skull base and vertebrae: T1 compression fracture with 20-30% height loss, which appears unchanged from 12/29/2020 and new from 08/18/2020. Similar mild associated superior T1 endplate sclerosis. No evidence of new/interval fracture. Lucency through the superior aspect of the right C2 lateral mass has well corticated margins and appears similar to prior, most consistent with a chronic benign vascular channel. Soft tissues and spinal canal: No prevertebral fluid or swelling. No visible canal hematoma. Disc levels: Similar moderate degenerative disc disease at C5-C6 and C6-C7 where there is disc height loss, endplate sclerosis and posterior disc osteophyte complex. Calcified posterior disc bulges at C3-C4 and C4-C5, likely similar. Multilevel facet/uncovertebral hypertrophy. Upper chest: Biapical pleuroparenchymal scarring. Other: Calcific atherosclerosis at the carotid bifurcation. IMPRESSION: CT head: 1. Small (4 mm) acute intraparenchymal hemorrhage in the left temporal lobe. No appreciable surrounding edema or mass effect. 2. Similar chronic microvascular ischemic disease and atrophy. CT  cervical spine: 1. T1 compression fracture with 20-30% height loss, which appears unchanged from 12/29/2020 and new from 08/18/2020. 2.  No evidence of new/interval fracture or traumatic malalignment. 3. Similar multilevel degenerative change. Findings discussed with Dr. Ellender Hose via telephone at 7:52 PM. Electronically Signed   By: Margaretha Sheffield M.D.   On: 04/18/2021 07:56   CT Cervical Spine Wo Contrast  Result Date: 04/18/2021 CLINICAL DATA:  Head trauma, minor (Age >= 65y); Neck trauma (Age >= 65y) EXAM: CT HEAD WITHOUT CONTRAST CT CERVICAL SPINE WITHOUT CONTRAST TECHNIQUE: Multidetector CT imaging of the head and cervical spine was performed following the standard protocol without intravenous contrast. Multiplanar CT image reconstructions of the cervical spine were also generated. COMPARISON:  Multiple priors, most recent 12/29/2020. FINDINGS: CT HEAD FINDINGS Brain: Small 4 x 4 x 4 mm hyperdense intraparenchymal hemorrhage within the left temporal lobe (series 3, image 14). No appreciable surrounding edema or mass effect. No evidence of acute large vascular territory infarct, hydrocephalus, mass lesion, midline shift, or visible extra-axial fluid collection. Similar patchy white matter hypoattenuation, nonspecific but compatible with chronic microvascular ischemic disease. Similar chronic atrophy with ex vacuo ventricular dilation, particularly the right temporal lobe and temporal horn of the right lateral ventricle. Vascular: No hyperdense vessel identified. Calcific intracranial atherosclerosis. Skull: No evidence of acute fracture. Sinuses/Orbits: Visualized sinuses are clear.  Unremarkable orbits. Other: No mastoid effusions. CT CERVICAL SPINE FINDINGS Alignment: Unchanged alignment. Similar anterolisthesis of C3 on C4, C4 on C5, and T1 on T2. Mild broad dextrocurvature. Skull base and vertebrae: T1 compression fracture with 20-30% height loss, which appears unchanged from 12/29/2020 and new from  08/18/2020. Similar mild associated superior T1 endplate sclerosis. No evidence of new/interval fracture. Lucency through the superior aspect of the right C2 lateral mass has well corticated margins and appears similar to prior, most consistent with a chronic benign vascular channel. Soft tissues and spinal canal: No prevertebral fluid or swelling. No visible canal hematoma. Disc levels: Similar moderate degenerative disc disease at C5-C6 and C6-C7 where there is disc height loss, endplate sclerosis and posterior disc osteophyte complex. Calcified posterior disc bulges at C3-C4 and C4-C5, likely similar. Multilevel facet/uncovertebral hypertrophy. Upper chest: Biapical pleuroparenchymal scarring. Other: Calcific atherosclerosis at the carotid bifurcation. IMPRESSION: CT head: 1. Small (4 mm) acute intraparenchymal hemorrhage in the left temporal lobe. No appreciable surrounding edema or mass effect. 2. Similar chronic microvascular ischemic disease and atrophy. CT cervical spine: 1. T1 compression fracture with 20-30% height loss, which appears unchanged from 12/29/2020 and new from 08/18/2020. 2. No evidence of new/interval fracture or traumatic malalignment. 3. Similar multilevel degenerative change. Findings discussed with Dr. Ellender Hose via telephone at 7:52 PM. Electronically Signed   By: Margaretha Sheffield M.D.   On: 04/18/2021 07:56   DG Hand Complete Left  Result Date: 04/18/2021 CLINICAL DATA:  Fall.  Left hand pain and deformity. EXAM: LEFT HAND - COMPLETE 3+ VIEW COMPARISON:  None. FINDINGS: Technically suboptimal exam due to persistent flexion deformity of the left hand. No acute fracture or dislocation identified. Generalized osteopenia is noted. Mild-to-moderate osteoarthritis is seen involving the D IP joint of the index finger. Peripheral vascular calcification also noted. IMPRESSION: Technically suboptimal exam due to persistent flexion deformity of the hand. No acute findings. Electronically Signed    By: Marlaine Hind M.D.   On: 04/18/2021 07:57     Assessment/Plan Principal Problem:   Frequent falls Active Problems:   Brain bleed (HCC)   Left wrist sprain   GERD (gastroesophageal reflux disease)   CAD (coronary artery disease)     Patient is an 87 year old female who presents  to the ER for evaluation following a fall and is found to have an intraparenchymal hemorrhage.    Frequent falls Patient presents to the emergency room after a fall and has a skin tear over the left hand as well as an intraparenchymal bleed. She has had multiple falls over the last couple of months Will place patient on fall precautions Consult PT.   Intraparenchymal hemorrhage Patient has no neurologic deficits Appreciate neurosurgery consult He recommends to start patient on Keppra 500 mg daily for 7 days Will repeat CT scan of the head in 6 hours to evaluate for possible expansion. No intervention is planned at this time     Coronary artery disease coronary artery disease Stable Continue metoprolol, atorvastatin and Ranexa     GERD Continue Protonix   DVT prophylaxis: SCD  Code Status: full code  Family Communication: Greater than 50% of time was spent discussing plan of care with patient and her daughter Wynelle Beckmann at the bedside.  All questions and concerns have been addressed.  They verbalized understanding and agree with the plan.  CODE STATUS was discussed that she is a full code. Disposition Plan: Back to previous home environment Consults called: Neurosurgery Status:At the time of admission, it appears that the appropriate admission status for this patient is inpatient. This is judged to be reasonable and necessary to provide the required intensity of service to ensure the patient's safety given the presenting symptoms, physical exam findings, and initial radiographic and laboratory data in the context of their comorbid conditions. Patient requires inpatient status due to  high intensity of service, high risk for further deterioration and high frequency of surveillance required.     Collier Bullock MD [facility]     04/18/2021, 12:44 PM

## 2021-04-18 NOTE — Consult Note (Signed)
Neurosurgery-New Consultation Evaluation 04/18/2021 Monique Burns [id number]  Identifying Statement: Monique Burns is a 87 y.o. female from [place] [id number] with fall  Physician Requesting Consultation: [facility] [place] [facility]  History of Present Illness: [name] is here after a fall where she did sustain a head injury as well as a left wrist injury.  Given this, she did go for CT scan of the head and they saw a small left temporal intraparenchymal hemorrhage.  There are no other intracranial injuries noted.  She currently does not endorse any significant headache.  She denies any neurologic symptoms including no weakness, numbness, or speech problems.  Given the CT findings, we are asked to consult  Past Medical History:  Past Medical History:  Diagnosis Date   Anemia    Arthritis    Blood dyscrasia    since being put on Brilinta after stent placement developing hematoma   Cancer ([facility])    basal cell left leg   CHF (congestive heart failure) (HCC)    GERD (gastroesophageal reflux disease)    Headache    h/o migraines   Hyperlipidemia    Hypertension    Leukopenia    MI (myocardial infarction) ([place]) 02/2015   s/p 4 stents and CABG in 2017   Renal disorder    stage 3-Dr Caryl Comes keeping a check on kidney function   Shortness of breath dyspnea    mild compared to prior stenting-pt states she would have to stop and rest if walking a mile   Thyroid disease     Social History: Social History   Socioeconomic History   Marital status: Married    Spouse name: Not on file   Number of children: Not on file   Years of education: Not on file   Highest education level: Not on file  Occupational History   Not on file  Tobacco Use   Smoking status: Never   Smokeless tobacco: Never  Vaping Use   Vaping Use: Never used  Substance and Sexual Activity   Alcohol use: No   Drug use: No   Sexual activity: Not on file  Other Topics Concern   Not on file   Social History Narrative   Lives at home with husband   Independent at baseline   Social Determinants of Health   Financial Resource Strain: Not on file  Food Insecurity: Not on file  Transportation Needs: Not on file  Physical Activity: Not on file  Stress: Not on file  Social Connections: Not on file  Intimate Partner Violence: Not on file    Family History: Family History  Problem Relation Age of Onset   Breast cancer Paternal Grandmother    Brain cancer Mother    Stroke Father    Brain cancer Son     Review of Systems:  Review of Systems - General ROS: Negative Psychological ROS: Negative Ophthalmic ROS: Negative ENT ROS: Negative Hematological and Lymphatic ROS: Negative  Endocrine ROS: Negative Respiratory ROS: Negative Cardiovascular ROS: Negative Gastrointestinal ROS: Negative Genito-Urinary ROS: Negative Musculoskeletal ROS: Negative Neurological ROS: Negative for headache, speech problems, weakness Dermatological ROS: Negative  Physical Exam: BP (!) 128/45   Pulse (!) 57   Resp 17   Ht 5\' 7"  (1.702 m)   Wt 52.2 kg   SpO2 99%   BMI 18.02 kg/m  Body mass index is 18.02 kg/m. Body surface area is 1.57 meters squared. General appearance: Alert, cooperative, in no acute distress Head: Normocephalic, atraumatic Eyes: Normal, EOM  intact Oropharynx: Some dried blood around nares Neck: Supple, Ext: splint on the left wrist  Neurologic exam:  Mental status: alertness: alert, orientation: person, place, time, affect: normal Speech: fluent and clear, names and repeats Cranial nerves:  II: Some difficulty with visual field evaluation and appears to have some acuity problems laterally III/IV/VI: extra-ocular motions intact bilaterally V/VII:no evidence of facial droop or weakness  VIII: hearing appears decreased at baseline XI: trapezius strength symmetric,  sternocleidomastoid strength symmetric XII: tongue strength symmetric  Motor:strength symmetric  and appears appropriate for age in all extremities Sensory: intact to light touch in all extremities Gait: Not tested  Laboratory: Results for orders placed or performed during the hospital encounter of 04/18/21  Resp Panel by RT-PCR (Flu A&B, Covid) Nasopharyngeal Swab   Specimen: Nasopharyngeal Swab; Nasopharyngeal(NP) swabs in vial transport medium  Result Value Ref Range   SARS Coronavirus 2 by RT PCR NEGATIVE NEGATIVE   Influenza A by PCR NEGATIVE NEGATIVE   Influenza B by PCR NEGATIVE NEGATIVE  Comprehensive metabolic panel  Result Value Ref Range   Sodium 135 135 - 145 mmol/L   Potassium 4.0 3.5 - 5.1 mmol/L   Chloride 105 98 - 111 mmol/L   CO2 23 22 - 32 mmol/L   Glucose, Bld 114 (H) 70 - 99 mg/dL   BUN 37 (H) 8 - 23 mg/dL   Creatinine, Ser 1.42 (H) 0.44 - 1.00 mg/dL   Calcium 9.5 8.9 - 10.3 mg/dL   Total Protein 7.4 6.5 - 8.1 g/dL   Albumin 4.2 3.5 - 5.0 g/dL   AST 29 15 - 41 U/L   ALT 16 0 - 44 U/L   Alkaline Phosphatase 122 38 - 126 U/L   Total Bilirubin 0.9 0.3 - 1.2 mg/dL   GFR, Estimated 36 (L) >60 mL/min   Anion gap 7 5 - 15  CBC with Differential  Result Value Ref Range   WBC 7.1 4.0 - 10.5 K/uL   RBC 3.51 (L) 3.87 - 5.11 MIL/uL   Hemoglobin 11.1 (L) 12.0 - 15.0 g/dL   HCT 34.9 (L) 36.0 - 46.0 %   MCV 99.4 80.0 - 100.0 fL   MCH 31.6 26.0 - 34.0 pg   MCHC 31.8 30.0 - 36.0 g/dL   RDW 13.2 11.5 - 15.5 %   Platelets 247 150 - 400 K/uL   nRBC 0.0 0.0 - 0.2 %   Neutrophils Relative % 71 %   Neutro Abs 5.1 1.7 - 7.7 K/uL   Lymphocytes Relative 18 %   Lymphs Abs 1.3 0.7 - 4.0 K/uL   Monocytes Relative 9 %   Monocytes Absolute 0.6 0.1 - 1.0 K/uL   Eosinophils Relative 1 %   Eosinophils Absolute 0.1 0.0 - 0.5 K/uL   Basophils Relative 0 %   Basophils Absolute 0.0 0.0 - 0.1 K/uL   Immature Granulocytes 1 %   Abs Immature Granulocytes 0.06 0.00 - 0.07 K/uL   I personally reviewed labs  Imaging: CT head:Small (4 mm) acute intraparenchymal hemorrhage in the  left temporal lobe. No appreciable surrounding edema or mass effect. 2. Similar chronic microvascular ischemic disease and atrophy.     Impression/Plan:  [name] is here after a fall with a very small amount of acute intraparenchymal hemorrhage.  Given this location, I do recommend starting Keppra prophylaxis for seizures.  However, the small nature of it does not lead to any intervention needed at this time.  I would recommend repeating a scan in 6 to  8 hours for observance of stability.  If that exam shows no increase, then she will be cleared from a neurosurgery perspective.   1.  Diagnosis: Small temporal hemorrhage  2.  Plan -Keppra 500 mg twice daily x7 days -Repeat CT scan of the head in 6 hours, we can review when completed

## 2021-04-19 ENCOUNTER — Inpatient Hospital Stay: Payer: Medicare Other

## 2021-04-19 DIAGNOSIS — E440 Moderate protein-calorie malnutrition: Secondary | ICD-10-CM | POA: Insufficient documentation

## 2021-04-19 DIAGNOSIS — I619 Nontraumatic intracerebral hemorrhage, unspecified: Secondary | ICD-10-CM

## 2021-04-19 DIAGNOSIS — K219 Gastro-esophageal reflux disease without esophagitis: Secondary | ICD-10-CM

## 2021-04-19 DIAGNOSIS — S63502A Unspecified sprain of left wrist, initial encounter: Secondary | ICD-10-CM

## 2021-04-19 LAB — CBC
HCT: 28.9 % — ABNORMAL LOW (ref 36.0–46.0)
Hemoglobin: 9.4 g/dL — ABNORMAL LOW (ref 12.0–15.0)
MCH: 33 pg (ref 26.0–34.0)
MCHC: 32.5 g/dL (ref 30.0–36.0)
MCV: 101.4 fL — ABNORMAL HIGH (ref 80.0–100.0)
Platelets: 206 10*3/uL (ref 150–400)
RBC: 2.85 MIL/uL — ABNORMAL LOW (ref 3.87–5.11)
RDW: 13.1 % (ref 11.5–15.5)
WBC: 4.6 10*3/uL (ref 4.0–10.5)
nRBC: 0 % (ref 0.0–0.2)

## 2021-04-19 LAB — BASIC METABOLIC PANEL
Anion gap: 4 — ABNORMAL LOW (ref 5–15)
BUN: 35 mg/dL — ABNORMAL HIGH (ref 8–23)
CO2: 24 mmol/L (ref 22–32)
Calcium: 9 mg/dL (ref 8.9–10.3)
Chloride: 107 mmol/L (ref 98–111)
Creatinine, Ser: 1.19 mg/dL — ABNORMAL HIGH (ref 0.44–1.00)
GFR, Estimated: 44 mL/min — ABNORMAL LOW (ref >60)
Glucose, Bld: 97 mg/dL (ref 70–99)
Potassium: 3.7 mmol/L (ref 3.5–5.1)
Sodium: 135 mmol/L (ref 135–145)

## 2021-04-19 MED ORDER — ENSURE ENLIVE PO LIQD
237.0000 mL | Freq: Two times a day (BID) | ORAL | Status: DC
  Administered 2021-04-20 – 2021-04-24 (×9): 237 mL via ORAL

## 2021-04-19 MED ORDER — RANOLAZINE ER 500 MG PO TB12
500.0000 mg | ORAL_TABLET | Freq: Two times a day (BID) | ORAL | Status: DC
  Administered 2021-04-19 – 2021-04-25 (×12): 500 mg via ORAL
  Filled 2021-04-19 (×13): qty 1

## 2021-04-19 MED ORDER — DULOXETINE HCL 30 MG PO CPEP
30.0000 mg | ORAL_CAPSULE | Freq: Every day | ORAL | Status: DC
  Administered 2021-04-19 – 2021-04-25 (×7): 30 mg via ORAL
  Filled 2021-04-19 (×7): qty 1

## 2021-04-19 NOTE — Evaluation (Signed)
Occupational Therapy Evaluation Patient Details Name: Monique Burns MRN: [id number] DOB: December 26, 1930 Today's Date: 04/19/2021   History of Present Illness Monique Burns is an 59yoF who [facility]18[facility]21 from ALF after a fall. Per imaging studies no fracutres to Left elbow or wrist, skin injury only. CT scan of the head/cervical spine without contrast shows a small 4 mm acute intraparenchymal hemorrhage in the left temporal lobe. Pt admitted with ICH. Head CT repeated next day, no extension of bleed reported. PMH: Parkinson-like dementia c LUE tremor, frequent falls, CHF, GERD, HTN, CAD s/p PCI and stent x4, CABG, CKD-IV, hypoTSH, Rt temporal CVA, Left ribs 10-12 fractures August 2022.   Clinical Impression   [name] was seen for OT evaluation this date. Prior to hospital admission, pt needed assist with dressing, bathing, meals and toileting. Pt lives in assisted living facility. Currently pt demonstrates impairments as described below (See OT problem list) which functionally limit her ability to perform ADL/self-care tasks. Pt currently requires MOD A + MAX multimodal cues for LBD- don/doff socks, sitting in chair. Pt MOD A + RW + VCs for functional mobility, ambulating ~ 6 steps, assist needed for advancing/turning RW and VCs needed for sequencing. Pt requires MIN A for standing balance, able to lift objects within BOS while standing. Pt would benefit from skilled OT services to address noted impairments and functional limitations (see below for any additional details) in order to maximize safety and independence while minimizing falls risk and caregiver burden. Upon hospital discharge, recommend STR to maximize pt safety and return to PLOF.       Recommendations for follow up therapy are one component of a multi-disciplinary discharge planning process, led by the attending physician.  Recommendations may be updated based on patient status, additional functional criteria and insurance  authorization.   Follow Up Recommendations  Skilled nursing-short term rehab (<3 hours/day)    Assistance Recommended at Discharge Frequent or constant Supervision/Assistance  Functional Status Assessment  Patient has had a recent decline in their functional status and demonstrates the ability to make significant improvements in function in a reasonable and predictable amount of time.  Equipment Recommendations  Other (comment) (Defer to next venue of care)    Recommendations for Other Services       Precautions / Restrictions Precautions Precautions: Fall Required Braces or Orthoses:  Restrictions Weight Bearing Restrictions: Yes LUE Weight Bearing: Weight bearing as tolerated      Mobility Bed Mobility Overal bed mobility:  Bed Mobility:           General bed mobility comments: Pt received and left in chair.    Transfers Overall transfer level: Needs assistance Equipment used: Rolling walker (2 wheels) Transfers: Sit to/from Stand Sit to Stand: Min assist;+2 physical assistance Stand pivot transfers: Min assist                Balance Overall balance assessment: History of Falls;Needs assistance Sitting-balance support: No upper extremity supported Sitting balance-Leahy Scale: Fair     Standing balance support: Bilateral upper extremity supported;During functional activity;Reliant on assistive device for balance Standing balance-Leahy Scale: Fair                             ADL either performed or assessed with clinical judgement   ADL Overall ADL's : Needs assistance/impaired  General ADL Comments: Pt MOD A + MAX multimodal cues for LBD- don/doff socks, sitting in chair. Pt MOD A + RW + VCs for functional mobility, ambulating ~ 6 steps, assist needed for advancing/turning RW and VCs needed for sequencing.      Pertinent Vitals/Pain Pain Assessment:       Hand Dominance Right    Extremity/Trunk Assessment Upper Extremity Assessment Upper Extremity Assessment: Generalized weakness   Lower Extremity Assessment Lower Extremity Assessment: Overall WFL for tasks assessed       Communication Communication Communication: Other (comment);No difficulties (Speech occasionally non-sensical)   Cognition Arousal/Alertness: Awake/alert Behavior During Therapy: WFL for tasks assessed/performed Overall Cognitive Status: Impaired/Different from baseline Area of Impairment: Following commands;Safety/judgement;Problem solving;Memory                     Memory: Decreased recall of precautions Following Commands: Follows one step commands inconsistently Safety/Judgement: Decreased awareness of safety   Problem Solving: Slow processing;Requires verbal cues;Difficulty sequencing       General Comments       Exercises Exercises: Other exercises Other Exercises Other Exercises: Pt educ re: OT role, DME recs, d/c recs, falls prevention Other Exercises: Sit<>stand, LBD, standing balance task, ambulating in a small space   Shoulder Instructions      Home Living Family/patient expects to be discharged to:: Other (Comment)                             Home Equipment:  (Pt currently in assisted living, daughter seeking higher level of care in transitonal living situation)   Additional Comments: frequent falls in last 6 months      Prior Functioning/Environment Prior Level of Function : Needs assist  Cognitive Assist : Mobility (cognitive);ADLs (cognitive) Mobility (Cognitive): Step by step cues ADLs (Cognitive): Step by step cues Physical Assist : ADLs (physical)                OT Problem List: Decreased strength;Decreased range of motion;Decreased activity tolerance;Impaired balance (sitting and/or standing);Decreased knowledge of use of DME or AE;Decreased knowledge of precautions;Decreased safety awareness      OT  Treatment/Interventions: Self-care/ADL training;Therapeutic exercise;DME and/or AE instruction;Therapeutic activities    OT Goals(Current goals can be found in the care plan section) Acute Rehab OT Goals Patient Stated Goal: to feel better OT Goal Formulation: With patient/family Time For Goal Achievement: 05/03/21 Potential to Achieve Goals: Good ADL Goals Pt Will Perform Grooming: with min guard assist;standing Pt Will Perform Lower Body Dressing: with supervision;sit to/from stand Pt Will Transfer to Toilet: bedside commode;ambulating;with min assist  OT Frequency: Min 2X/week   Barriers to D/C: Decreased caregiver support          Co-evaluation              AM-PAC OT "6 Clicks" Daily Activity     Outcome Measure Help from another person eating meals?: A Little Help from another person taking care of personal grooming?: A Little Help from another person toileting, which includes using toliet, bedpan, or urinal?: A Lot Help from another person bathing (including washing, rinsing, drying)?: A Lot Help from another person to put on and taking off regular upper body clothing?: A Little Help from another person to put on and taking off regular lower body clothing?: A Lot 6 Click Score: 15   End of Session Equipment Utilized During Treatment: Gait belt;Rolling walker (2 wheels)  Activity Tolerance: Patient tolerated treatment  well Patient left: in chair;with call bell/phone within reach;with family/visitor present;with chair alarm set  OT Visit Diagnosis: Unsteadiness on feet (R26.81);Other abnormalities of gait and mobility (R26.89);Repeated falls (R29.6);Muscle weakness (generalized) (M62.81);History of falling (Z91.81)                Time: 5749-3552 OT Time Calculation (min): 24 min Charges:  OT General Charges $OT Visit: 1 Visit OT Evaluation $OT Eval Moderate Complexity: 1 Mod OT Treatments $Self Care/Home Management : 8-22 mins  Nino Glow, Markus Daft 04/19/2021, 4:12 PM

## 2021-04-19 NOTE — Progress Notes (Signed)
Initial Nutrition Assessment  DOCUMENTATION CODES:   Non-severe (moderate) malnutrition in context of chronic illness, Underweight  INTERVENTION:   -Liberalize diet to regular -MVI with minerals daily -Ensure Enlive po BID, each supplement provides 350 kcal and 20 grams of protein  -Magic cup TID with meals, each supplement provides 290 kcal and 9 grams of protein   NUTRITION DIAGNOSIS:   Moderate Malnutrition related to chronic illness (CHF) as evidenced by mild fat depletion, moderate fat depletion, moderate muscle depletion, severe muscle depletion.  GOAL:   Patient will meet greater than or equal to 90% of their needs  MONITOR:   PO intake, Supplement acceptance, Labs, Weight trends, Skin, I & O's  REASON FOR ASSESSMENT:   Malnutrition Screening Tool    ASSESSMENT:   Monique Burns is a 87 y.o. female with medical history significant for CHF, GERD, hypertension, coronary artery disease, stage IV chronic kidney disease, hypothyroidism who presents to the ER via EMS for evaluation following a fall.  Patient was noted to have significant skin tear involving the left hand and per EMS concerns for a fractured left wrist.  Pt admitted with intraparenchymal hemorrhage s/p fall.   Reviewed I/O's: -300 ml x 24 hours  UOP: 500 ml x 24 hours  Spoke with pt, who was sitting in recliner chair at time of visit. Observed breakfast tray- pt consumed about 25% of grits. When asked about her appetite, pt replied "I think I can do better with that". Per pt, she consumes 3 meals per day, which consists mainly of vegetables and fruit. Per pt, she is tolerating diet texture well, but refuses to eat foods that she does not like.   Pt with intermittent confusion at time of visit. She would answer questions appropriately for some time, but then have inappropriate responses.    Reviewed wt hx; wt has been stable over the past year.   Discussed importance of good meal and supplement intake to  promote healing.   Medications reviewed and include miralax and keppra.   Labs reviewed.   NUTRITION - FOCUSED PHYSICAL EXAM:  Flowsheet Row Most Recent Value  Orbital Region Moderate depletion  Upper Arm Region Moderate depletion  Thoracic and Lumbar Region Mild depletion  Buccal Region Mild depletion  Temple Region Moderate depletion  Clavicle Bone Region Severe depletion  Clavicle and Acromion Bone Region Severe depletion  Scapular Bone Region Severe depletion  Dorsal Hand Severe depletion  Patellar Region Moderate depletion  Anterior Thigh Region Moderate depletion  Posterior Calf Region Moderate depletion  Edema (RD Assessment) None  Hair Reviewed  Eyes Reviewed  Mouth Reviewed  Skin Reviewed  Nails Reviewed       Diet Order:   Diet Order             Diet Heart Room service appropriate? Yes; Fluid consistency: Thin  Diet effective now                   EDUCATION NEEDS:   Education needs have been addressed  Skin:  Skin Assessment: Skin Integrity Issues: Skin Integrity Issues:: Other (Comment) Other: skin tear to lt arm  Last BM:  04/16/21  Height:   Ht Readings from Last 1 Encounters:  04/18/21 5\' 7"  (1.702 m)    Weight:   Wt Readings from Last 1 Encounters:  04/18/21 52.2 kg    Ideal Body Weight:  61.4 kg  BMI:  Body mass index is 18.02 kg/m.  Estimated Nutritional Needs:   Kcal:  1600-1800  Protein:  75-90 grams  Fluid:  > 1.6 L    Loistine Chance, RD, LDN, [place] Registered Dietitian II Certified Diabetes Care and Education Specialist Please refer to [facility] for RD and/or RD on-call/weekend/after hours pager

## 2021-04-19 NOTE — Progress Notes (Signed)
Notified  Dr Louanne Belton  PT needed order for WBAT left wrist. Order received

## 2021-04-19 NOTE — Evaluation (Signed)
Physical Therapy Evaluation Patient Details Name: Monique Burns MRN: [id number] DOB: [birth year] Today's Date: 04/19/2021  History of Present Illness  Monique Burns is an 89yoF who comes to [facility] on 04/18/21 from ALF after a fall. Per imaging studies no fracutres to Left elbow or wrist, skin injury only. CT scan of the head/cervical spine without contrast shows a small 4 mm acute intraparenchymal hemorrhage in the left temporal lobe. Pt admitted with ICH. Head CT repeated next day, no extension of bleed reported. PMH: Parkinson-like dementia c LUE tremor, frequent falls, CHF, GERD, HTN, CAD s/p PCI and stent x4, CABG, CKD-IV, hypoTSH, Rt temporal CVA, Left ribs 10-12 fractures August 2022.  Clinical Impression  Pt admitted with above diagnosis. Pt currently with functional limitations due to the deficits listed below (see "PT Problem List"). Upon entry, pt in bed, awake and agreeable to participate. The pt is alert, pleasant, interactive, and completely disoriented to situation, place, time. PLOF known from prior admission, pt familiar to author from that time. Pt needs minA to get to EOB, balances well, then modA to stand and minA support while taking small steps toward recliner. Pt follows simple single step commands well. Author kept pt LUE NWB given recent sprain and lack of brace, cognitive deficits. Noted bruising. Will ask MD for formal weightbearing orders and splint orders. Patient's performance this date reveals decreased ability, independence, and tolerance in performing all basic mobility required for performance of activities of daily living. Pt requires additional DME, close physical assistance, and cues for safe participate in mobility. Pt will benefit from skilled PT intervention to increase independence and safety with basic mobility in preparation for discharge to the venue listed below.      Recommendations for follow up therapy are one component of a multi-disciplinary discharge  planning process, led by the attending physician.  Recommendations may be updated based on patient status, additional functional criteria and insurance authorization.  Follow Up Recommendations Home health PT    Assistance Recommended at Discharge Frequent or constant Supervision/Assistance  Functional Status Assessment Patient has had a recent decline in their functional status and demonstrates the ability to make significant improvements in function in a reasonable and predictable amount of time.  Equipment Recommendations  None recommended by PT    Recommendations for Other Services       Precautions / Restrictions Precautions Precautions: Fall Required Braces or Orthoses:  (have asked for formal weight bearing and splint orders) Restrictions Weight Bearing Restrictions: No (have asked for formal weight bearing and splint orders)      Mobility  Bed Mobility Overal bed mobility: Needs Assistance Bed Mobility: Supine to Sit     Supine to sit: Min assist;HOB elevated          Transfers Overall transfer level: Needs assistance Equipment used: 1 person hand held assist Transfers: Sit to/from Stand;Bed to chair/wheelchair/BSC Sit to Stand: Mod assist Stand pivot transfers: Min assist              Ambulation/Gait Ambulation/Gait assistance:  (deferred until LUE weightbearing and splint orders are obtained)                Stairs            Wheelchair Mobility    Modified Rankin (Stroke Patients Only)       Balance Overall balance assessment: History of Falls  Pertinent Vitals/Pain Pain Assessment:  (says yes, but does not rate)    Home Living Family/patient expects to be discharged to:: Assisted living                 Home Equipment: Rollator (4 wheels) Additional Comments: was falling ~ e/o month at last hospitalization.    Prior Function Prior Level of Function : Needs  assist  Cognitive Assist : Mobility (cognitive);ADLs (cognitive) Mobility (Cognitive): Step by step cues ADLs (Cognitive): Step by step cues Physical Assist : ADLs (physical)             Hand Dominance   Dominant Hand: Right    Extremity/Trunk Assessment                Communication      Cognition Arousal/Alertness: Awake/alert Behavior During Therapy: WFL for tasks assessed/performed Overall Cognitive Status: Within Functional Limits for tasks assessed                                          General Comments      Exercises     Assessment/Plan    PT Assessment Patient needs continued PT services  PT Problem List Decreased strength;Decreased range of motion;Decreased activity tolerance;Decreased balance;Decreased mobility;Decreased coordination;Decreased cognition;Decreased knowledge of use of DME;Decreased safety awareness;Decreased knowledge of precautions       PT Treatment Interventions DME instruction;Functional mobility training;Therapeutic activities;Therapeutic exercise;Balance training;Patient/family education    PT Goals (Current goals can be found in the Care Plan section)  Acute Rehab PT Goals PT Goal Formulation: Patient unable to participate in goal setting    Frequency Min 2X/week   Barriers to discharge        Co-evaluation               AM-PAC PT "6 Clicks" Mobility  Outcome Measure Help needed turning from your back to your side while in a flat bed without using bedrails?: A Lot Help needed moving from lying on your back to sitting on the side of a flat bed without using bedrails?: A Lot Help needed moving to and from a bed to a chair (including a wheelchair)?: A Lot Help needed standing up from a chair using your arms (e.g., wheelchair or bedside chair)?: A Lot Help needed to walk in hospital room?: A Lot Help needed climbing 3-5 steps with a railing? : Total 6 Click Score: 11    End of Session Equipment  Utilized During Treatment: Gait belt Activity Tolerance: Patient tolerated treatment well Patient left: in chair;with chair alarm set;with call bell/phone within reach;with SCD's reapplied Nurse Communication: Weight bearing status;Precautions PT Visit Diagnosis: Unsteadiness on feet (R26.81);Other abnormalities of gait and mobility (R26.89);Repeated falls (R29.6);Muscle weakness (generalized) (M62.81)    Time: 5397-6734 PT Time Calculation (min) (ACUTE ONLY): 13 min   Charges:   PT Evaluation $PT Eval Moderate Complexity: 1 Mod         12:28 PM, 04/19/21 Etta Grandchild, PT, DPT Physical Therapist - [facility]  [phone] ([place])    Eldonna Neuenfeldt C 04/19/2021, 12:24 PM

## 2021-04-19 NOTE — Consult Note (Signed)
[facility] Nurse wound consult note Consultation was completed by review of records, images and assistance from the bedside nurse/clinical staff.  Reason for Consult: left wrist skin tear Wound type: trauma from fall; skin tear Pressure Injury POA: NA Measurement: see nursing flow sheet Wound bed: see nursing flow sheet Dressing procedure/placement/frequency: single layer xeroform as nonadherent and antiseptic properties. Cover with dry dressing, change every other day.    Re consult if needed, will not follow at this time. Thanks  Yehudit Fulginiti [employer], RN,CWOCN, CNS, [place] [phone])

## 2021-04-19 NOTE — Progress Notes (Addendum)
PROGRESS NOTE  Monique Burns YBW:[id number] DOB: 01/01/1931 DOA: 04/18/2021 PCP: Adin Hector, MD   LOS: 1 day   Brief narrative:  Monique Burns is a 87 y.o. female with past medical history of congestive heart failure, GERD, hypertension, coronary artery disease, stage IV CKD and hypothyroidism presented to the hospital after sustaining a fall with left wrist pain.  Patient does have history of recurrent falls and ambulates with the help of a rolling walker.  In the ED, BUN was elevated at 37 creatinine 1.4.  Respiratory viral panel was negative.  CT head scan including cervical spine showed small 4 mm acute intraparenchymal hemorrhage in the left temporal lobe.  No significant edema was noted.  T1 compression fracture with 20 to 30% height loss was also noted which was unchanged from August 2022.  Patient was then considered for admission to hospital for further evaluation and treatment.    Assessment/Plan:  Principal Problem:   Frequent falls Active Problems:   Brain bleed (HCC)   Left wrist sprain   GERD (gastroesophageal reflux disease)   CAD (coronary artery disease)   Frequent falls History of recurrent falls in the past.  Consult PT.   patient is currently from assisted living facility.  Likely will need skilled nursing facility on discharge.   Intraparenchymal hemorrhage No focal neurological deficits.  Neurosurgery was consulted.  Recommended to continue Keppra 500 mg twice a day for 7 days.  Repeat head CT scan has been advised to see for extension.  Will order today.  Communicated with Dr Lacinda Axon neurosurgery who recommended to stay off anticoagulants and aspirin for at least 7 days then is okay to restart if needed after that   Coronary artery disease coronary artery disease No acute issues.  Continue metoprolol atorvastatin and Ranexa.  Hold off with aspirin.    GERD Continue Protonix  Left wrist pain skin tear.  Continue dressing.  Supportive care.  X-ray did  not show any fracture.  Disposition.  Patient is currently from assisted living facility might need skilled nursing facility on discharge.  PT evaluation pending.  DVT prophylaxis: SCDs Start: 04/18/21 1240   Code Status: Full code  Family Communication:  Not at bedside.  Status is: Inpatient  Remains inpatient appropriate because: Recurrent falls, intraparenchymal hemorrhage, possible need for placement   Consultants: Neurosurgery  Procedures: None  Anti-infectives:  None  Anti-infectives (From admission, onward)    Start     Dose/Rate Route Frequency Ordered Stop   04/18/21 1015  cefTRIAXone (ROCEPHIN) 2 g in sodium chloride 0.9 % 100 mL IVPB        2 g 200 mL/hr over 30 Minutes Intravenous  Once 04/18/21 1009 04/18/21 1054   04/18/21 0730  ceFAZolin (ANCEF) IVPB 2g/100 mL premix  Status:  Discontinued        2 g 200 mL/hr over 30 Minutes Intravenous On call to O.R. 04/18/21 0707 04/18/21 1009      Subjective: Today, patient was seen and examined at bedside.  Patient is oriented to self only, poor historian.  Complains of mild headache.  Complains of left wrist pain.  No nausea vomiting reported  Objective: Vitals:   04/18/21 2051 04/19/21 0451  BP: (!) 130/55 (!) 143/50  Pulse: 60 (!) 59  Resp: 20 16  Temp: 98.6 F (37 C) 98.1 F (36.7 C)  SpO2: 100% 97%    Intake/Output Summary (Last 24 hours) at 04/19/2021 7681 Last data filed at 04/19/2021 0430 Gross per  24 hour  Intake 200 ml  Output 500 ml  Net -300 ml   Filed Weights   04/18/21 0630  Weight: 52.2 kg   Body mass index is 18.02 kg/m.   Physical Exam:  GENERAL: Patient is alert awake and oriented to self only, confused.. Not in obvious distress.  Thinly built. HENT: No scleral pallor or icterus. Pupils equally reactive to light. Oral mucosa is moist NECK: is supple, no gross swelling noted. CHEST: Clear to auscultation. No crackles or wheezes.  Diminished breath sounds bilaterally. CVS:  S1 and S2 heard, no murmur. Regular rate and rhythm.  ABDOMEN: Soft, non-tender, bowel sounds are present. EXTREMITIES: No edema.  Left wrist with dressing. CNS: Cranial nerves are intact. No focal motor deficits. SKIN: warm and dry without rashes.  Data Review: I have personally reviewed the following laboratory data and studies,  CBC: Recent Labs  Lab 04/18/21 0758 04/19/21 0207  WBC 7.1 4.6  NEUTROABS 5.1  --   HGB 11.1* 9.4*  HCT 34.9* 28.9*  MCV 99.4 101.4*  PLT 247 981   Basic Metabolic Panel: Recent Labs  Lab 04/18/21 0758 04/19/21 0207  NA 135 135  K 4.0 3.7  CL 105 107  CO2 23 24  GLUCOSE 114* 97  BUN 37* 35*  CREATININE 1.42* 1.19*  CALCIUM 9.5 9.0   Liver Function Tests: Recent Labs  Lab 04/18/21 0758  AST 29  ALT 16  ALKPHOS 122  BILITOT 0.9  PROT 7.4  ALBUMIN 4.2   No results for input(s): LIPASE, AMYLASE in the last 168 hours. No results for input(s): AMMONIA in the last 168 hours. Cardiac Enzymes: No results for input(s): CKTOTAL, CKMB, CKMBINDEX, TROPONINI in the last 168 hours. BNP (last 3 results) No results for input(s): BNP in the last 8760 hours.  ProBNP (last 3 results) No results for input(s): PROBNP in the last 8760 hours.  CBG: No results for input(s): GLUCAP in the last 168 hours. Recent Results (from the past 240 hour(s))  Resp Panel by RT-PCR (Flu A&B, Covid) Nasopharyngeal Swab     Status: None   Collection Time: 04/18/21 10:41 AM   Specimen: Nasopharyngeal Swab; Nasopharyngeal(NP) swabs in vial transport medium  Result Value Ref Range Status   SARS Coronavirus 2 by RT PCR NEGATIVE NEGATIVE Final    Comment: (NOTE) SARS-CoV-2 target nucleic acids are NOT DETECTED.  The SARS-CoV-2 RNA is generally detectable in upper respiratory specimens during the acute phase of infection. The lowest concentration of SARS-CoV-2 viral copies this assay can detect is 138 copies/mL. A negative result does not preclude  SARS-Cov-2 infection and should not be used as the sole basis for treatment or other patient management decisions. A negative result may occur with  improper specimen collection/handling, submission of specimen other than nasopharyngeal swab, presence of viral mutation(s) within the areas targeted by this assay, and inadequate number of viral copies(<138 copies/mL). A negative result must be combined with clinical observations, patient history, and epidemiological information. The expected result is Negative.  Fact Sheet for Patients:  EntrepreneurPulse.com.au  Fact Sheet for Healthcare Providers:  IncredibleEmployment.be  This test is no t yet approved or cleared by the [place] FDA and  has been authorized for detection and/or diagnosis of SARS-CoV-2 by FDA under an Emergency Use Authorization (EUA). This EUA will remain  in effect (meaning this test can be used) for the duration of the COVID-19 declaration under Section 564(b)(1) of the Act, 21 U.S.C.section 360bbb-3(b)(1), unless the authorization is terminated  or revoked sooner.       Influenza A by PCR NEGATIVE NEGATIVE Final   Influenza B by PCR NEGATIVE NEGATIVE Final    Comment: (NOTE) The Xpert Xpress SARS-CoV-2/FLU/RSV plus assay is intended as an aid in the diagnosis of influenza from Nasopharyngeal swab specimens and should not be used as a sole basis for treatment. Nasal washings and aspirates are unacceptable for Xpert Xpress SARS-CoV-2/FLU/RSV testing.  Fact Sheet for Patients: EntrepreneurPulse.com.au  Fact Sheet for Healthcare Providers: IncredibleEmployment.be  This test is not yet approved or cleared by the [place] FDA and has been authorized for detection and/or diagnosis of SARS-CoV-2 by FDA under an Emergency Use Authorization (EUA). This EUA will remain in effect (meaning this test can be used) for the duration of  the COVID-19 declaration under Section 564(b)(1) of the Act, 21 U.S.C. section 360bbb-3(b)(1), unless the authorization is terminated or revoked.  Performed at [facility], [address] [place], [place] [address]      Studies: DG Elbow 2 Views Left  Result Date: 04/18/2021 CLINICAL DATA:  Fall.  Left elbow pain. EXAM: LEFT ELBOW - 2 VIEW COMPARISON:  None. FINDINGS: There is no evidence of fracture, dislocation, or joint effusion. There is no evidence of arthropathy or other focal bone abnormality. Generalized osteopenia noted. Soft tissues are unremarkable. IMPRESSION: No acute findings. Osteopenia. Electronically Signed   By: Marlaine Hind M.D.   On: 04/18/2021 07:58   DG Wrist Complete Left  Result Date: 04/18/2021 CLINICAL DATA:  Fall.  Left wrist pain and deformity. EXAM: LEFT WRIST - COMPLETE 3+ VIEW COMPARISON:  None. FINDINGS: There is no evidence of fracture or dislocation. There is no evidence of arthropathy or other focal bone abnormality. Generalized osteopenia is noted. Prominent dorsal soft tissue swelling and laceration is seen, however no radiopaque foreign body identified. Peripheral vascular calcification also noted. IMPRESSION: Prominent dorsal soft tissue and laceration. No evidence of acute fracture or radiopaque foreign body. Electronically Signed   By: Marlaine Hind M.D.   On: 04/18/2021 07:55   CT HEAD WO CONTRAST (5MM)  Result Date: 04/18/2021 CLINICAL DATA:  Head trauma, minor (Age >= 65y); Neck trauma (Age >= 65y) EXAM: CT HEAD WITHOUT CONTRAST CT CERVICAL SPINE WITHOUT CONTRAST TECHNIQUE: Multidetector CT imaging of the head and cervical spine was performed following the standard protocol without intravenous contrast. Multiplanar CT image reconstructions of the cervical spine were also generated. COMPARISON:  Multiple priors, most recent 12/29/2020. FINDINGS: CT HEAD FINDINGS Brain: Small 4 x 4 x 4 mm hyperdense intraparenchymal hemorrhage within the  left temporal lobe (series 3, image 14). No appreciable surrounding edema or mass effect. No evidence of acute large vascular territory infarct, hydrocephalus, mass lesion, midline shift, or visible extra-axial fluid collection. Similar patchy white matter hypoattenuation, nonspecific but compatible with chronic microvascular ischemic disease. Similar chronic atrophy with ex vacuo ventricular dilation, particularly the right temporal lobe and temporal horn of the right lateral ventricle. Vascular: No hyperdense vessel identified. Calcific intracranial atherosclerosis. Skull: No evidence of acute fracture. Sinuses/Orbits: Visualized sinuses are clear.  Unremarkable orbits. Other: No mastoid effusions. CT CERVICAL SPINE FINDINGS Alignment: Unchanged alignment. Similar anterolisthesis of C3 on C4, C4 on C5, and T1 on T2. Mild broad dextrocurvature. Skull base and vertebrae: T1 compression fracture with 20-30% height loss, which appears unchanged from 12/29/2020 and new from 08/18/2020. Similar mild associated superior T1 endplate sclerosis. No evidence of new/interval fracture. Lucency through the superior aspect of the right C2 lateral mass has  well corticated margins and appears similar to prior, most consistent with a chronic benign vascular channel. Soft tissues and spinal canal: No prevertebral fluid or swelling. No visible canal hematoma. Disc levels: Similar moderate degenerative disc disease at C5-C6 and C6-C7 where there is disc height loss, endplate sclerosis and posterior disc osteophyte complex. Calcified posterior disc bulges at C3-C4 and C4-C5, likely similar. Multilevel facet/uncovertebral hypertrophy. Upper chest: Biapical pleuroparenchymal scarring. Other: Calcific atherosclerosis at the carotid bifurcation. IMPRESSION: CT head: 1. Small (4 mm) acute intraparenchymal hemorrhage in the left temporal lobe. No appreciable surrounding edema or mass effect. 2. Similar chronic microvascular ischemic disease  and atrophy. CT cervical spine: 1. T1 compression fracture with 20-30% height loss, which appears unchanged from 12/29/2020 and new from 08/18/2020. 2. No evidence of new/interval fracture or traumatic malalignment. 3. Similar multilevel degenerative change. Findings discussed with Dr. Ellender Hose via telephone at 7:52 PM. Electronically Signed   By: Margaretha Sheffield M.D.   On: 04/18/2021 07:56   CT Cervical Spine Wo Contrast  Result Date: 04/18/2021 CLINICAL DATA:  Head trauma, minor (Age >= 65y); Neck trauma (Age >= 65y) EXAM: CT HEAD WITHOUT CONTRAST CT CERVICAL SPINE WITHOUT CONTRAST TECHNIQUE: Multidetector CT imaging of the head and cervical spine was performed following the standard protocol without intravenous contrast. Multiplanar CT image reconstructions of the cervical spine were also generated. COMPARISON:  Multiple priors, most recent 12/29/2020. FINDINGS: CT HEAD FINDINGS Brain: Small 4 x 4 x 4 mm hyperdense intraparenchymal hemorrhage within the left temporal lobe (series 3, image 14). No appreciable surrounding edema or mass effect. No evidence of acute large vascular territory infarct, hydrocephalus, mass lesion, midline shift, or visible extra-axial fluid collection. Similar patchy white matter hypoattenuation, nonspecific but compatible with chronic microvascular ischemic disease. Similar chronic atrophy with ex vacuo ventricular dilation, particularly the right temporal lobe and temporal horn of the right lateral ventricle. Vascular: No hyperdense vessel identified. Calcific intracranial atherosclerosis. Skull: No evidence of acute fracture. Sinuses/Orbits: Visualized sinuses are clear.  Unremarkable orbits. Other: No mastoid effusions. CT CERVICAL SPINE FINDINGS Alignment: Unchanged alignment. Similar anterolisthesis of C3 on C4, C4 on C5, and T1 on T2. Mild broad dextrocurvature. Skull base and vertebrae: T1 compression fracture with 20-30% height loss, which appears unchanged from 12/29/2020  and new from 08/18/2020. Similar mild associated superior T1 endplate sclerosis. No evidence of new/interval fracture. Lucency through the superior aspect of the right C2 lateral mass has well corticated margins and appears similar to prior, most consistent with a chronic benign vascular channel. Soft tissues and spinal canal: No prevertebral fluid or swelling. No visible canal hematoma. Disc levels: Similar moderate degenerative disc disease at C5-C6 and C6-C7 where there is disc height loss, endplate sclerosis and posterior disc osteophyte complex. Calcified posterior disc bulges at C3-C4 and C4-C5, likely similar. Multilevel facet/uncovertebral hypertrophy. Upper chest: Biapical pleuroparenchymal scarring. Other: Calcific atherosclerosis at the carotid bifurcation. IMPRESSION: CT head: 1. Small (4 mm) acute intraparenchymal hemorrhage in the left temporal lobe. No appreciable surrounding edema or mass effect. 2. Similar chronic microvascular ischemic disease and atrophy. CT cervical spine: 1. T1 compression fracture with 20-30% height loss, which appears unchanged from 12/29/2020 and new from 08/18/2020. 2. No evidence of new/interval fracture or traumatic malalignment. 3. Similar multilevel degenerative change. Findings discussed with Dr. Ellender Hose via telephone at 7:52 PM. Electronically Signed   By: Margaretha Sheffield M.D.   On: 04/18/2021 07:56   DG Hand Complete Left  Result Date: 04/18/2021 CLINICAL DATA:  Fall.  Left  hand pain and deformity. EXAM: LEFT HAND - COMPLETE 3+ VIEW COMPARISON:  None. FINDINGS: Technically suboptimal exam due to persistent flexion deformity of the left hand. No acute fracture or dislocation identified. Generalized osteopenia is noted. Mild-to-moderate osteoarthritis is seen involving the D IP joint of the index finger. Peripheral vascular calcification also noted. IMPRESSION: Technically suboptimal exam due to persistent flexion deformity of the hand. No acute findings.  Electronically Signed   By: Marlaine Hind M.D.   On: 04/18/2021 07:57      Flora Lipps, MD  [facility] 04/19/2021  If 7PM-7AM, please contact night-coverage

## 2021-04-20 LAB — BASIC METABOLIC PANEL
Anion gap: 7 (ref 5–15)
BUN: 30 mg/dL — ABNORMAL HIGH (ref 8–23)
CO2: 22 mmol/L (ref 22–32)
Calcium: 9.3 mg/dL (ref 8.9–10.3)
Chloride: 104 mmol/L (ref 98–111)
Creatinine, Ser: 1.31 mg/dL — ABNORMAL HIGH (ref 0.44–1.00)
GFR, Estimated: 39 mL/min — ABNORMAL LOW (ref >60)
Glucose, Bld: 100 mg/dL — ABNORMAL HIGH (ref 70–99)
Potassium: 4.1 mmol/L (ref 3.5–5.1)
Sodium: 133 mmol/L — ABNORMAL LOW (ref 135–145)

## 2021-04-20 LAB — CBC
HCT: 29 % — ABNORMAL LOW (ref 36.0–46.0)
Hemoglobin: 9.9 g/dL — ABNORMAL LOW (ref 12.0–15.0)
MCH: 33 pg (ref 26.0–34.0)
MCHC: 34.1 g/dL (ref 30.0–36.0)
MCV: 96.7 fL (ref 80.0–100.0)
Platelets: 218 10*3/uL (ref 150–400)
RBC: 3 MIL/uL — ABNORMAL LOW (ref 3.87–5.11)
RDW: 13.2 % (ref 11.5–15.5)
WBC: 7 10*3/uL (ref 4.0–10.5)
nRBC: 1.1 % — ABNORMAL HIGH (ref 0.0–0.2)

## 2021-04-20 MED ORDER — BISACODYL 10 MG RE SUPP
10.0000 mg | Freq: Every day | RECTAL | Status: DC | PRN
  Administered 2021-04-20: 17:00:00 10 mg via RECTAL
  Filled 2021-04-20: qty 1

## 2021-04-20 MED ORDER — LEVETIRACETAM 500 MG PO TABS
500.0000 mg | ORAL_TABLET | Freq: Two times a day (BID) | ORAL | Status: AC
  Administered 2021-04-20 – 2021-04-22 (×5): 500 mg via ORAL
  Filled 2021-04-20 (×5): qty 1

## 2021-04-20 NOTE — Progress Notes (Signed)
Physical Therapy Treatment Patient Details Name: Monique Burns MRN: [id number] DOB: 09-23-1930 Today's Date: 04/20/2021   History of Present Illness Monique Burns is an 55yoF who comes to [facility] 04/[facility] from ALF after a fall. Per imaging studies no fracutres to Left elbow or wrist, skin injury only. CT scan of the head/cervical spine without contrast shows a small 4 mm acute intraparenchymal hemorrhage in the left temporal lobe. Pt admitted with ICH. Head CT repeated next day, no extension of bleed reported. PMH: Parkinson-like dementia c LUE tremor, frequent falls, CHF, GERD, HTN, CAD s/p PCI and stent x4, CABG, CKD-IV, hypoTSH, Rt temporal CVA, Left ribs 10-12 fractures August 2022.    PT Comments    Pt was long sitting in bed upon arriving. She is alert but disoriented. Was oriented x 2. Was able to follow simple one step commands with increased time and vcs throughout. Pt presents with LUE wrist and hand tightness. MD messaged for order for resting hand splint to avoid getting LUE contracture. She required max assist for all desired task using LUE. Per supportive daughter states L hand/wrist deficits since October. Pt did fully participate in session and was able to ambulate short distance with RW. PT updating recommendations for SNF at DC.    Recommendations for follow up therapy are one component of a multi-disciplinary discharge planning process, led by the attending physician.  Recommendations may be updated based on patient status, additional functional criteria and insurance authorization.  Follow Up Recommendations  Skilled nursing-short term rehab (<3 hours/day)     Assistance Recommended at Discharge Frequent or constant Supervision/Assistance  Equipment Recommendations  None recommended by PT       Precautions / Restrictions Precautions Precautions: Fall Restrictions Weight Bearing Restrictions: Yes LUE Weight Bearing: Weight bearing as tolerated     Mobility  Bed  Mobility Overal bed mobility: Needs Assistance Bed Mobility: Supine to Sit     Supine to sit: Min assist;HOB elevated     General bed mobility comments: Increased time to exit R side of bed with min assist to max vcs    Transfers Overall transfer level: Needs assistance Equipment used: Rolling walker (2 wheels) Transfers: Sit to/from Stand Sit to Stand: Min assist           General transfer comment: Min assist to stand form slightly elevated bed height    Ambulation/Gait Ambulation/Gait assistance: Min assist Gait Distance (Feet): 15 Feet Assistive device: Rolling walker (2 wheels) Gait Pattern/deviations: Step-to pattern;Antalgic;Trunk flexed;Narrow base of support;Decreased stride length Gait velocity: decreased     General Gait Details: Pt was able to ambulate 15 ft with min assist        Cognition Arousal/Alertness: Awake/alert Behavior During Therapy: WFL for tasks assessed/performed Overall Cognitive Status: Impaired/Different from baseline Area of Impairment: Following commands;Safety/judgement;Problem solving;Memory       Memory: Decreased recall of precautions Following Commands: Follows one step commands inconsistently Safety/Judgement: Decreased awareness of safety   Problem Solving: Slow processing;Requires verbal cues;Difficulty sequencing General Comments: Pt is A but disoriented x 2           General Comments General comments (skin integrity, edema, etc.): messaged MD about ordering resting hand splint for LUE.      Pertinent Vitals/Pain Pain Assessment: No/denies pain     PT Goals (current goals can now be found in the care plan section) Acute Rehab PT Goals Patient Stated Goal: none stated Progress towards PT goals: Progressing toward goals    Frequency  Min 2X/week      PT Plan Discharge plan needs to be updated       AM-PAC PT "6 Clicks" Mobility   Outcome Measure  Help needed turning from your back to your side  while in a flat bed without using bedrails?: A Lot Help needed moving from lying on your back to sitting on the side of a flat bed without using bedrails?: A Lot Help needed moving to and from a bed to a chair (including a wheelchair)?: A Lot Help needed standing up from a chair using your arms (e.g., wheelchair or bedside chair)?: A Lot Help needed to walk in hospital room?: A Lot Help needed climbing 3-5 steps with a railing? : Total 6 Click Score: 11    End of Session Equipment Utilized During Treatment: Gait belt Activity Tolerance: Patient tolerated treatment well Patient left: in bed;with call bell/phone within reach;with bed alarm set;with nursing/sitter in room;with family/visitor present Nurse Communication: Mobility status PT Visit Diagnosis: Unsteadiness on feet (R26.81);Other abnormalities of gait and mobility (R26.89);Repeated falls (R29.6);Muscle weakness (generalized) (M62.81)     Time: 7017-7939 PT Time Calculation (min) (ACUTE ONLY): 28 min  Charges:  $Gait Training: 8-22 mins $Therapeutic Activity: 8-22 mins                    Julaine Fusi PTA 04/20/21, 1:58 PM

## 2021-04-20 NOTE — TOC Progression Note (Addendum)
Transition of Care [facility]) - Progression Note    Patient Details  Name: Monique Burns MRN: [id number] Date of Birth: [birth year]  Transition of Care [facility]) CM/SW [place], RN Phone Number: 04/20/2021, 10:56 AM  Clinical Narrative:   RNCM spoke to daughter, who states she is open to transferring patient to SNF; however she has some questions for the transition in the community prior to discharge.  RNCM spoke to [place] from [place], who is reaching out to family as to whether they prefer memory care or SNF at this time.  Lattie Haw will also address facility questions for family and call RNCM.  TOC to follow.  Addendum, 1444pm:  PT recommends SNF, Patient's daughter spoke to facility [facility]) and agrees for SNF.  Bed search sent in [place] area.  TOC to follow and communicate bed offers to daughter.  Expected Discharge Plan: [place] Barriers to Discharge: Continued Medical Work up  Expected Discharge Plan and Services Expected Discharge Plan: [place]   Discharge Planning Services: CM Consult Post Acute Care Choice: [place] Living arrangements for the past 2 months: [place]                 DME Arranged: N/A DME Agency: NA       HH Arranged: NA HH Agency: NA         Social Determinants of Health (SDOH) Interventions    Readmission Risk Interventions No flowsheet data found.

## 2021-04-20 NOTE — Progress Notes (Signed)
PROGRESS NOTE    Monique Burns  AJO:[id number] DOB: 1930/10/06 DOA: 04/18/2021 PCP: Adin Hector, MD    Brief Narrative:  87 year old with history of chronic combined heart failure, GERD, hypertension, coronary artery disease, CKD stage IV and hypothyroidism, dementia presented to hospital from her assisted living facility after sustaining a fall, left wrist pain.  Does have history of recurrent fall, walks with supervision and assistant with rolling walker.  In the emergency room skeletal survey negative, 4 mm intraparenchymal hemorrhage left temporal lobe with no significant edema.  Old T1 compression fracture.   Assessment & Plan:   Principal Problem:   Frequent falls Active Problems:   Brain bleed (HCC)   Left wrist sprain   GERD (gastroesophageal reflux disease)   CAD (coronary artery disease)   Malnutrition of moderate degree  Acute traumatic intraparenchymal hemorrhage without any focal neurological deficits: Recurrent fall. Initial CT scan with left temporal lobe intraparenchymal hemorrhage, follow-up CT scan is stable. Discussed with neurosurgery, recommended conservative management.  Aspirin on hold for 1 week. Work with PT OT.  All-time fall precautions.  Will need more supervision. Prophylaxis with Keppra for 7 days, changed to oral today. PT OT recommended SNF.  Coronary artery disease: Continue metoprolol, statin and Ranexa.  Aspirin on hold as above.  Left wrist pain with a skin tear: Supportive treatment.  Without any fracture.  GERD: On PPI.  Dementia/failure to thrive: Discussed with patient's daughter who is her healthcare power of attorney.  We will need to start palliative care discussions.  Agreeable for palliative care consultation. Currently full code.  Will address goal of care. Should benefit with palliative or hospice follow-up.  DVT prophylaxis: SCDs Start: 04/18/21 1240   Code Status: Full code Family Communication: Daughter on the  phone Disposition Plan: Status is: Inpatient  Remains inpatient appropriate because: Altered mental status.  Need for therapies.         Consultants:  Neurosurgery, phone consultation Palliative care  Procedures:  None  Antimicrobials:  None   Subjective: Patient seen and examined.  Poor historian.  Mostly sleepy.  She was barely awake on my interview. Later on, nurse was able to give her medications. Discussed with patient's daughter who tells me that patient is like this more than 50% of the time.  Objective: Vitals:   04/20/21 0414 04/20/21 0414 04/20/21 0746 04/20/21 1149  BP: 136/60 136/60 (!) 132/47 (!) 115/45  Pulse: 62 63 86 (!) 57  Resp: 16 16 16 16   Temp: 98 F (36.7 C) 98 F (36.7 C) 98.2 F (36.8 C) 98 F (36.7 C)  TempSrc:      SpO2: 96% 97% 99% 99%  Weight:      Height:        Intake/Output Summary (Last 24 hours) at 04/20/2021 1305 Last data filed at 04/19/2021 1356 Gross per 24 hour  Intake 240 ml  Output --  Net 240 ml   Filed Weights   04/18/21 0630  Weight: 52.2 kg    Examination:  General exam: Appears calm and comfortable  Patient is chronically sick looking.  Frail and debilitated.  Thinly built.  Not in any distress. Alert on stimulation but not interactive.  Did not follow any commands. Respiratory system: Clear to auscultation. Respiratory effort normal. Cardiovascular system: S1 & S2 heard, RRR.  Gastrointestinal system: Soft.  Nontender. Central nervous system: Moves all extremities.  Barely awake.    Data Reviewed: I have personally reviewed following labs and imaging studies  CBC: Recent Labs  Lab 04/18/21 0758 04/19/21 0207 04/20/21 0228  WBC 7.1 4.6 7.0  NEUTROABS 5.1  --   --   HGB 11.1* 9.4* 9.9*  HCT 34.9* 28.9* 29.0*  MCV 99.4 101.4* 96.7  PLT 247 206 601   Basic Metabolic Panel: Recent Labs  Lab 04/18/21 0758 04/19/21 0207 04/20/21 0228  NA 135 135 133*  K 4.0 3.7 4.1  CL 105 107 104  CO2  23 24 22   GLUCOSE 114* 97 100*  BUN 37* 35* 30*  CREATININE 1.42* 1.19* 1.31*  CALCIUM 9.5 9.0 9.3   GFR: Estimated Creatinine Clearance: 24.5 mL/min (A) (by C-G formula based on SCr of 1.31 mg/dL (H)). Liver Function Tests: Recent Labs  Lab 04/18/21 0758  AST 29  ALT 16  ALKPHOS 122  BILITOT 0.9  PROT 7.4  ALBUMIN 4.2   No results for input(s): LIPASE, AMYLASE in the last 168 hours. No results for input(s): AMMONIA in the last 168 hours. Coagulation Profile: No results for input(s): INR, PROTIME in the last 168 hours. Cardiac Enzymes: No results for input(s): CKTOTAL, CKMB, CKMBINDEX, TROPONINI in the last 168 hours. BNP (last 3 results) No results for input(s): PROBNP in the last 8760 hours. HbA1C: No results for input(s): HGBA1C in the last 72 hours. CBG: No results for input(s): GLUCAP in the last 168 hours. Lipid Profile: No results for input(s): CHOL, HDL, LDLCALC, TRIG, CHOLHDL, LDLDIRECT in the last 72 hours. Thyroid Function Tests: No results for input(s): TSH, T4TOTAL, FREET4, T3FREE, THYROIDAB in the last 72 hours. Anemia Panel: No results for input(s): VITAMINB12, FOLATE, FERRITIN, TIBC, IRON, RETICCTPCT in the last 72 hours. Sepsis Labs: No results for input(s): PROCALCITON, LATICACIDVEN in the last 168 hours.  Recent Results (from the past 240 hour(s))  Resp Panel by RT-PCR (Flu A&B, Covid) Nasopharyngeal Swab     Status: None   Collection Time: 04/18/21 10:41 AM   Specimen: Nasopharyngeal Swab; Nasopharyngeal(NP) swabs in vial transport medium  Result Value Ref Range Status   SARS Coronavirus 2 by RT PCR NEGATIVE NEGATIVE Final    Comment: (NOTE) SARS-CoV-2 target nucleic acids are NOT DETECTED.  The SARS-CoV-2 RNA is generally detectable in upper respiratory specimens during the acute phase of infection. The lowest concentration of SARS-CoV-2 viral copies this assay can detect is 138 copies/mL. A negative result does not preclude  SARS-Cov-2 infection and should not be used as the sole basis for treatment or other patient management decisions. A negative result may occur with  improper specimen collection/handling, submission of specimen other than nasopharyngeal swab, presence of viral mutation(s) within the areas targeted by this assay, and inadequate number of viral copies(<138 copies/mL). A negative result must be combined with clinical observations, patient history, and epidemiological information. The expected result is Negative.  Fact Sheet for Patients:  EntrepreneurPulse.com.au  Fact Sheet for Healthcare Providers:  IncredibleEmployment.be  This test is no t yet approved or cleared by the [place] FDA and  has been authorized for detection and/or diagnosis of SARS-CoV-2 by FDA under an Emergency Use Authorization (EUA). This EUA will remain  in effect (meaning this test can be used) for the duration of the COVID-19 declaration under Section 564(b)(1) of the Act, 21 U.S.C.section 360bbb-3(b)(1), unless the authorization is terminated  or revoked sooner.       Influenza A by PCR NEGATIVE NEGATIVE Final   Influenza B by PCR NEGATIVE NEGATIVE Final    Comment: (NOTE) The Xpert Xpress SARS-CoV-2/FLU/RSV plus assay is  intended as an aid in the diagnosis of influenza from Nasopharyngeal swab specimens and should not be used as a sole basis for treatment. Nasal washings and aspirates are unacceptable for Xpert Xpress SARS-CoV-2/FLU/RSV testing.  Fact Sheet for Patients: EntrepreneurPulse.com.au  Fact Sheet for Healthcare Providers: IncredibleEmployment.be  This test is not yet approved or cleared by the [place] FDA and has been authorized for detection and/or diagnosis of SARS-CoV-2 by FDA under an Emergency Use Authorization (EUA). This EUA will remain in effect (meaning this test can be used) for the duration of  the COVID-19 declaration under Section 564(b)(1) of the Act, 21 U.S.C. section 360bbb-3(b)(1), unless the authorization is terminated or revoked.  Performed at [facility], [address] [place], [place] [address]          Radiology Studies: CT HEAD WO CONTRAST (5MM)  Result Date: 04/19/2021 CLINICAL DATA:  Trauma EXAM: CT HEAD WITHOUT CONTRAST TECHNIQUE: Contiguous axial images were obtained from the base of the skull through the vertex without intravenous contrast. COMPARISON:  04/18/2021 FINDINGS: Brain: There is 4 mm increased density in the left posterior parietal cortex which appears slightly less dense. There is no adjacent edema or mass effect. There is no demonstrable extra-axial fluid collection. There is asymmetric prominence of right temporal horn which appears stable, possibly suggesting atrophy. There is decreased density in the periventricular white matter. Cortical sulci are prominent. Vascular: There are scattered arterial calcifications. Skull: Unremarkable. Sinuses/Orbits: Unremarkable. Other: None. IMPRESSION: There is 4 mm petechial hemorrhage in the posterior left temporal cortex with no significant change in size. Density appears slightly less. There are no new foci of bleeding within the cranium. Atrophy.  Small-vessel disease. Electronically Signed   By: Elmer Picker M.D.   On: 04/19/2021 10:46        Scheduled Meds:  atorvastatin  40 mg Oral QHS   cycloSPORINE  1 drop Both Eyes BID   DULoxetine  30 mg Oral Daily   feeding supplement  237 mL Oral BID BM   fesoterodine  4 mg Oral Daily   labetalol  10 mg Intravenous Once   lamoTRIgine  25 mg Oral BID   levETIRAcetam  500 mg Oral BID   lidocaine  1 patch Transdermal Q24H   metoprolol succinate  25 mg Oral Daily   multivitamin with minerals  1 tablet Oral Daily   polyethylene glycol  17 g Oral Daily   polyvinyl alcohol  1 drop Both Eyes BID   ranolazine  500 mg Oral BID   sodium  chloride flush  3 mL Intravenous Q12H   Continuous Infusions:  sodium chloride       LOS: 2 days    Time spent: 25 minutes    Barb Merino, MD [facility] Pager [phone]

## 2021-04-20 NOTE — NC FL2 (Signed)
[place] LEVEL OF CARE SCREENING TOOL     IDENTIFICATION  Patient Name: Monique Burns Birthdate: January 26, 1931 Sex: female Admission Date (Current Location): 04/18/2021  [facility] and [place] Number:  Engineering geologist and Address:  [facility], [address] [place], [place] [address]      Provider Number: [id number]  Attending Physician Name and Address:  Barb Merino, MD  Relative Name and Phone Number:  Wynelle Beckmann (Daughter)   [phone] (Home Phone)    Current Level of Care: Hospital Recommended Level of Care: [place] Prior Approval Number:    Date Approved/Denied:   PASRR Number: [id number]  Discharge Plan: SNF    Current Diagnoses: Patient Active Problem List   Diagnosis Date Noted   Malnutrition of moderate degree 04/19/2021   Frequent falls 04/18/2021   Brain bleed ([place]) 04/18/2021   Left wrist sprain 04/18/2021   CAD (coronary artery disease) 04/18/2021   GERD (gastroesophageal reflux disease)    Protein-calorie malnutrition, severe 12/30/2020   Fall 12/29/2020   Debility 06/16/2020   Left hip pain    Lobar pneumonia ([place])    Acute metabolic encephalopathy    Falls frequently    Elevated d-dimer    Anemia    CAP (community acquired pneumonia) 05/28/2020   Overactive bladder 04/08/2020   Urge incontinence 04/08/2020   Nocturia 04/08/2020   Unstable angina ([place]) 12/20/2017   Protein malnutrition ([place]) 11/27/2015   Chest pain 11/26/2015   Chronic kidney disease 04/05/2015   HLD (hyperlipidemia) 04/05/2015   Essential hypertension 04/05/2015   Decreased leukocytes 04/05/2015   Arthritis, degenerative 04/05/2015   Disease of thyroid gland 04/05/2015   Acute myocardial infarction of anterior wall (HCC) 02/28/2015   Degenerative arthritis of hip 10/31/2013    Orientation RESPIRATION BLADDER Height & Weight     Self  Normal Incontinent Weight: 52.2 kg Height:  5\' 7"  (170.2 cm)   BEHAVIORAL SYMPTOMS/MOOD NEUROLOGICAL BOWEL NUTRITION STATUS    Convulsions/Seizures (history of seizures, no activity in hospital) Incontinent Supplemental  AMBULATORY STATUS COMMUNICATION OF NEEDS Skin   Total Care Verbally Skin abrasions (Abrasion L wrist with staples)                       Personal Care Assistance Level of Assistance  Bathing, Feeding, Dressing Bathing Assistance: Maximum assistance (2 assist getting up) Feeding assistance:  (feeder, has teeth not able to feed herself due to cognition, crush meds) Dressing Assistance: Maximum assistance     Functional Limitations Info  Sight, Hearing, Speech Sight Info: Impaired Hearing Info: Impaired Speech Info: Adequate (rambles due to dementia)    SPECIAL CARE FACTORS FREQUENCY  PT (By licensed PT), OT (By licensed OT)     PT Frequency: min 5x weekly OT Frequency: min 5x weekly            Contractures Contractures Info: Not present    Additional Factors Info  Code Status, Allergies Code Status Info: Full Allergies Info: Brilinta (ticagrelorImdur (isosorbide Nitrate)           Current Medications (04/20/2021):  This is the current hospital active medication list Current Facility-Administered Medications  Medication Dose Route Frequency Provider Last Rate Last Admin   0.9 %  sodium chloride infusion  250 mL Intravenous PRN Agbata, Tochukwu, MD       acetaminophen (TYLENOL) tablet 500 mg  500 mg Oral Q6H PRN Agbata, Tochukwu, MD   500 mg at 04/19/21 2124   atorvastatin (  LIPITOR) tablet 40 mg  40 mg Oral QHS Agbata, Tochukwu, MD   40 mg at 04/19/21 2124   cycloSPORINE (RESTASIS) 0.05 % ophthalmic emulsion 1 drop  1 drop Both Eyes BID Agbata, Tochukwu, MD   1 drop at 04/20/21 3532   DULoxetine (CYMBALTA) DR capsule 30 mg  30 mg Oral Daily Pokhrel, Laxman, MD   30 mg at 04/20/21 0920   feeding supplement (ENSURE ENLIVE / ENSURE PLUS) liquid 237 mL  237 mL Oral BID BM Pokhrel, Laxman, MD   237 mL at 04/20/21  0920   fesoterodine (TOVIAZ) tablet 4 mg  4 mg Oral Daily Agbata, Tochukwu, MD   4 mg at 04/20/21 0920   labetalol (NORMODYNE) injection 10 mg  10 mg Intravenous Once Duffy Bruce, MD       lamoTRIgine (LAMICTAL) tablet 25 mg  25 mg Oral BID Agbata, Tochukwu, MD   25 mg at 04/20/21 0920   levETIRAcetam (KEPPRA) tablet 500 mg  500 mg Oral BID Barb Merino, MD       lidocaine (LIDODERM) 5 % 1 patch  1 patch Transdermal Q24H Agbata, Tochukwu, MD   1 patch at 04/20/21 1338   metoprolol succinate (TOPROL-XL) 24 hr tablet 25 mg  25 mg Oral Daily Agbata, Tochukwu, MD   25 mg at 04/20/21 0920   multivitamin with minerals tablet 1 tablet  1 tablet Oral Daily Agbata, Tochukwu, MD   1 tablet at 04/20/21 0920   nitroGLYCERIN (NITROSTAT) SL tablet 0.4 mg  0.4 mg Sublingual Once PRN Agbata, Tochukwu, MD       ondansetron (ZOFRAN) tablet 4 mg  4 mg Oral Q6H PRN Agbata, Tochukwu, MD       Or   ondansetron (ZOFRAN) injection 4 mg  4 mg Intravenous Q6H PRN Agbata, Tochukwu, MD       polyethylene glycol (MIRALAX / GLYCOLAX) packet 17 g  17 g Oral Daily Agbata, Tochukwu, MD   17 g at 04/20/21 0920   polyvinyl alcohol (LIQUIFILM TEARS) 1.4 % ophthalmic solution 1 drop  1 drop Both Eyes BID Agbata, Tochukwu, MD   1 drop at 04/20/21 0921   ranolazine (RANEXA) 12 hr tablet 500 mg  500 mg Oral BID Pokhrel, Laxman, MD   500 mg at 04/20/21 0920   sodium chloride flush (NS) 0.9 % injection 3 mL  3 mL Intravenous Q12H Agbata, Tochukwu, MD   3 mL at 04/20/21 0921   sodium chloride flush (NS) 0.9 % injection 3 mL  3 mL Intravenous PRN Agbata, Tochukwu, MD         Discharge Medications: Please see discharge summary for a list of discharge medications.  Relevant Imaging Results:  Relevant Lab Results:   Additional Information SS# [id number]  Pete Pelt, RN

## 2021-04-21 ENCOUNTER — Encounter: Payer: Self-pay | Admitting: Internal Medicine

## 2021-04-21 DIAGNOSIS — Z515 Encounter for palliative care: Secondary | ICD-10-CM

## 2021-04-21 DIAGNOSIS — Z7189 Other specified counseling: Secondary | ICD-10-CM

## 2021-04-21 NOTE — TOC Progression Note (Signed)
Transition of Care [facility]) - Progression Note    Patient Details  Name: Monique Burns MRN: [id number] Date of Birth: December 07, 1930  Transition of Care [facility]) CM/SW [place], RN Phone Number: 04/21/2021, 2:24 PM  Clinical Narrative:   Palliative and [facility] in to see patient today for consult.  Daughter would like patient to have rehab in SNF at this time.  Outpatient palliative will follow patient in SNF.  Patient has several bed offers, daughter is at an appointment, TOC will communicate when daughter is available to make SNF bed choice.  TOC contact information given to daughter, TOC to follow to discharge.    Expected Discharge Plan: [place] Barriers to Discharge: Continued Medical Work up  Expected Discharge Plan and Services Expected Discharge Plan: [place]   Discharge Planning Services: CM Consult Post Acute Care Choice: [place] Living arrangements for the past 2 months: [place]                 DME Arranged: N/A DME Agency: NA       HH Arranged: NA HH Agency: NA         Social Determinants of Health (SDOH) Interventions    Readmission Risk Interventions No flowsheet data found.

## 2021-04-21 NOTE — Progress Notes (Signed)
[place] [facility]) Hospital Liaison Note  Notified of patient/family request for [facility] Palliative services at skilled nursing facility after discharge.  [facility] hospital liaison will follow patient for discharge disposition.  Please call with any hospice or outpatient palliative care related questions.  Thank you for the opportunity to participate in this patient's care.  Nadene Rubins, RN, BSN [place] [phone]

## 2021-04-21 NOTE — Consult Note (Signed)
Consultation Note Date: 04/21/2021   Patient Name: Monique Burns  DOB: 03/09/1931  MRN: [id number]  Age / Sex: 87 y.o., female  PCP: Adin Hector, MD Referring Physician: Barb Merino, MD  Reason for Consultation: Establishing goals of care and Psychosocial/spiritual support  HPI/Patient Profile: 87 y.o. female  with past medical history of CHF, GERD, HTN/HLD, CAD, CKD 4, hypothyroid, frequent falls ambulates with Rollator, basal cell cancer of left leg, admitted on 04/18/2021 with frequent falls with acute traumatic intraparenchymal hemorrhage without any focal neurological deficits.   Clinical Assessment and Goals of Care: I have reviewed medical records including EPIC notes, labs and imaging, received report from RN, assessed the patient.  Monique Burns is lying quietly in bed.  She appears acutely/chronically ill and very frail.  She greets me, making and somewhat keeping eye contact.  She is alert and oriented to person, place, situation but does seem to have some confusion/memory loss.  She is able to make her basic needs known.  As we for start talking there is no family present.  After 2 or 3 minutes, daughter, Wynelle Beckmann arrives.    We discuss diagnosis prognosis, GOC, EOL wishes, disposition and options.  I introduced Palliative Medicine as specialized medical care for people living with serious illness. It focuses on providing relief from the symptoms and stress of a serious illness. The goal is to improve quality of life for both the patient and the family.  We discussed a brief life review of the patient.  Monique Burns husband died last year.  She also lost a son.  She has been living in [facility], but continued to have frequent falls..    We then focused on their current illness.  We talked about her mobility, frailty.  We talked about short-term rehab for strength and balance.     Advanced directives, concepts specific to code status, were considered and discussed.  Monique Burns shares that Monique Burns has completed healthcare and [place].  She has advanced directives stating she would not want to be kept alive on life support.  We talked about the concept of "treat the treatable, but allowing natural death"/DNR.  Patient and family elect to allowing natural passing.  Orders updated.  [place] form completed and placed on chart.  Hospice and Palliative Care services outpatient were explained and offered.  We talk in detail about what is and is not provided with both palliative and hospice services.  Monique Burns is seeking to have a few weeks of short-term rehab.  She is agreeable to outpatient hospice for "treat the treatable" hospice care.  Provider choice offered, Lonia Chimera is provider of choice.  In-house rep notified.  At this point Mrs. Tonnesen will start with outpatient palliative until she is completed short-term rehab.  Discussed the importance of continued conversation with family and the medical providers regarding overall plan of care and treatment options, ensuring decisions are within the context of the patients values and GOCs. Questions and concerns were addressed.  The family was encouraged to call with questions or concerns.  PMT will continue to support holistically.  Conference with attending, bedside nursing staff, transition of care team related to patient condition, needs, goals of care, disposition.   HCPOA  HCPOA -daughter, Wynelle Beckmann.  Monique Burns husband died about 1 year ago.  She also lost a son.  Monique Burns is her next of kin but also her healthcare power of attorney.    SUMMARY OF RECOMMENDATIONS   At this point continue to treat the treatable but no CPR or intubation Short-term rehab Anticipate need for long-term care Would like to treat the treatable" hospice care with [facility]   Code Status/Advance Care  Planning: DNR -goldenrod form completed and placed on chart  Symptom Management:  Per hospitalist, no additional needs at this time.  Palliative Prophylaxis:  Frequent Pain Assessment, Oral Care, and Palliative Wound Care  Additional Recommendations (Limitations, Scope, Preferences): Treat the treatable but no CPR or intubation  Psycho-social/Spiritual:  Desire for further Chaplaincy support:no Additional Recommendations: Caregiving  Support/Resources and Education on Hospice  Prognosis:  Unable to determine, based on outcomes.  6 months or less would not be surprising based on declining functional status, frailty, chronic illness burden.  Discharge Planning:  Anticipate discharge to short-term rehab with outpatient palliative.  Transitioning to long-term care with "treat the treatable" hospice care.       Primary Diagnoses: Present on Admission:  Brain bleed ([facility])  Left wrist sprain  GERD (gastroesophageal reflux disease)  CAD (coronary artery disease)   I have reviewed the medical record, interviewed the patient and family, and examined the patient. The following aspects are pertinent.  Past Medical History:  Diagnosis Date   Anemia    Arthritis    Blood dyscrasia    since being put on Brilinta after stent placement developing hematoma   Cancer ([place])    basal cell left leg   CHF (congestive heart failure) (HCC)    GERD (gastroesophageal reflux disease)    Headache    h/o migraines   Hyperlipidemia    Hypertension    Leukopenia    MI (myocardial infarction) ([place]) 02/2015   s/p 4 stents and CABG in 2017   Renal disorder    stage 3-Dr Caryl Comes keeping a check on kidney function   Shortness of breath dyspnea    mild compared to prior stenting-pt states she would have to stop and rest if walking a mile   Thyroid disease    Social History   Socioeconomic History   Marital status: Married    Spouse name: Not on file   Number of children: Not on file   Years of  education: Not on file   Highest education level: Not on file  Occupational History   Not on file  Tobacco Use   Smoking status: Never   Smokeless tobacco: Never  Vaping Use   Vaping Use: Never used  Substance and Sexual Activity   Alcohol use: No   Drug use: No   Sexual activity: Not on file  Other Topics Concern   Not on file  Social History Narrative   Lives at home with husband   Independent at baseline   Social Determinants of Health   Financial Resource Strain: Not on file  Food Insecurity: Not on file  Transportation Needs: Not on file  Physical Activity: Not on file  Stress: Not on file  Social Connections: Not on file   Family History  Problem Relation Age of  Onset   Breast cancer Paternal Grandmother    Brain cancer Mother    Stroke Father    Brain cancer Son    Scheduled Meds:  atorvastatin  40 mg Oral QHS   cycloSPORINE  1 drop Both Eyes BID   DULoxetine  30 mg Oral Daily   feeding supplement  237 mL Oral BID BM   fesoterodine  4 mg Oral Daily   labetalol  10 mg Intravenous Once   lamoTRIgine  25 mg Oral BID   levETIRAcetam  500 mg Oral BID   lidocaine  1 patch Transdermal Q24H   metoprolol succinate  25 mg Oral Daily   multivitamin with minerals  1 tablet Oral Daily   polyethylene glycol  17 g Oral Daily   polyvinyl alcohol  1 drop Both Eyes BID   ranolazine  500 mg Oral BID   sodium chloride flush  3 mL Intravenous Q12H   Continuous Infusions:  sodium chloride     PRN Meds:.sodium chloride, acetaminophen, bisacodyl, nitroGLYCERIN, ondansetron **OR** ondansetron (ZOFRAN) IV, sodium chloride flush Medications Prior to Admission:  Prior to Admission medications   Medication Sig Start Date End Date Taking? Authorizing Provider  acetaminophen (TYLENOL) 500 MG tablet Take 1,000 mg by mouth in the morning and at bedtime.   Yes [provider]  acetaminophen (TYLENOL) 500 MG tablet Take 500 mg by mouth every 6 (six) hours as needed for mild  pain or moderate pain.   Yes [provider]  aspirin EC 81 MG tablet Take 81 mg by mouth daily.   Yes [provider]  atorvastatin (LIPITOR) 40 MG tablet Take 40 mg by mouth at bedtime.   Yes [provider]  cycloSPORINE (RESTASIS) 0.05 % ophthalmic emulsion Place 1 drop into both eyes 2 (two) times daily.    Yes [provider]  DULoxetine (CYMBALTA) 30 MG capsule Take 30 mg by mouth daily.   Yes [provider]  guaiFENesin (ROBITUSSIN) 100 MG/5ML liquid Take 200 mg by mouth every 6 (six) hours as needed for cough.   Yes [provider]  lamoTRIgine (LAMICTAL XR) 50 MG 24 hour tablet Take 50 mg by mouth daily.   Yes [provider]  lidocaine (LIDODERM) 5 % Place 1 patch onto the skin daily. Remove & Discard patch within 12 hours or as directed by MD 06/01/20  Yes Nolberto Hanlon, MD  metoprolol succinate (TOPROL-XL) 25 MG 24 hr tablet Take 25 mg by mouth daily. 05/03/20  Yes [provider]  Multiple Vitamin (MULTIVITAMIN ADULT PO) Take 1 tablet by mouth daily.   Yes [provider]  nitroGLYCERIN (NITROSTAT) 0.4 MG SL tablet Place 1 tablet (0.4 mg total) under the tongue once as needed for up to 1 dose for chest pain. 06/20/20  Yes Alfred Levins, [place], MD  ondansetron [facility]) 4 MG tablet Take 4 mg by mouth every 6 (six) hours as needed for nausea or vomiting. 08/05/20  Yes [provider]  polyethylene glycol (MIRALAX / GLYCOLAX) 17 g packet Take 17 g by mouth daily.   Yes [provider]  polyvinyl alcohol (LIQUIFILM TEARS) 1.4 % ophthalmic solution Place 1 drop into both eyes 2 (two) times daily.   Yes [provider]  polyvinyl alcohol (LIQUIFILM TEARS) 1.4 % ophthalmic solution Place 1 drop into both eyes every 12 (twelve) hours as needed for dry eyes.   Yes [provider]  ranolazine (RANEXA) 1000 MG SR tablet Take 1,000 mg by mouth 2 (  two) times daily. 11/30/17  Yes [provider]  tolterodine (DETROL LA) 2 MG 24 hr capsule Take 2 mg by mouth daily.   Yes [provider]   Allergies  Allergen Reactions   Brilinta [Ticagrelor] Other (See Comments)    "caused stent to fail"   Imdur [Isosorbide Nitrate] Other (See Comments)    Headache   Review of Systems  Unable to perform ROS: Dementia   Physical Exam Vitals and nursing note reviewed.  Constitutional:      General: She is not in acute distress.    Appearance: She is ill-appearing.  HENT:     Mouth/Throat:     Mouth: Mucous membranes are moist.  Cardiovascular:     Rate and Rhythm: Normal rate.  Pulmonary:     Effort: Pulmonary effort is normal. No respiratory distress.  Skin:    General: Skin is warm and dry.  Neurological:     Mental Status: She is alert.     Comments: Known dementia  Psychiatric:        Mood and Affect: Mood normal.        Behavior: Behavior normal.     Comments: Calm and cooperative, not fearful.    Vital Signs: BP (!) 110/42 (BP Location: Right Arm)    Pulse (!) 56    Temp 98.3 F (36.8 C)    Resp 16    Ht 5\' 7"  (1.702 m)    Wt 52.2 kg    SpO2 95%    BMI 18.02 kg/m  Pain Scale: 0-10   Pain Score: 0-No pain   SpO2: SpO2: 95 % O2 Device:SpO2: 95 % O2 Flow Rate: .   IO: Intake/output summary:  Intake/Output Summary (Last 24 hours) at 04/21/2021 1349 Last data filed at 04/21/2021 0932 Gross per 24 hour  Intake 6 ml  Output 450 ml  Net -444 ml    LBM: Last BM Date: 04/21/21 Baseline Weight: Weight: 52.2 kg Most recent weight: Weight: 52.2 kg     Palliative Assessment/Data:   Flowsheet Rows    Flowsheet Row Most Recent Value  Intake Tab   Referral Department Hospitalist  Unit at Time of Referral Cardiac/Telemetry Unit  Palliative Care Primary Diagnosis Other (Comment)  Date Notified 04/20/21  Palliative Care Type New Palliative care  Reason for referral Clarify Goals of Care  Date of Admission 04/18/21  Date first seen by Palliative  Care 04/21/21  # of days Palliative referral response time 1 Day(s)  # of days IP prior to Palliative referral 2  Clinical Assessment   Palliative Performance Scale Score 30%  Pain Max last 24 hours Not able to report  Pain Min Last 24 hours Not able to report  Dyspnea Max Last 24 Hours Not able to report  Dyspnea Min Last 24 hours Not able to report  Psychosocial & Spiritual Assessment   Palliative Care Outcomes        Time In: 0900 Time Out: 1010 Time Total: 70 minutes  Greater than 50%  of this time was spent counseling and coordinating care related to the above assessment and plan.  Signed by: Drue Novel, NP   Please contact Palliative Medicine Team phone at [phone] for questions and concerns.  For individual provider: See Shea Evans

## 2021-04-21 NOTE — Progress Notes (Signed)
PROGRESS NOTE    Monique Burns  XBD:[id number] DOB: 1930/11/08 DOA: 04/18/2021 PCP: Adin Hector, MD    Brief Narrative:  87 year old with history of chronic combined heart failure, GERD, hypertension, coronary artery disease, CKD stage IV and hypothyroidism, dementia presented to hospital from her assisted living facility after sustaining a fall, left wrist pain.  Does have history of recurrent fall, walks with supervision and assistant with rolling walker.  In the emergency room skeletal survey negative, 4 mm intraparenchymal hemorrhage left temporal lobe with no significant edema.  Old T1 compression fracture.   Assessment & Plan:   Principal Problem:   Frequent falls Active Problems:   Brain bleed (HCC)   Left wrist sprain   GERD (gastroesophageal reflux disease)   CAD (coronary artery disease)   Malnutrition of moderate degree  Acute traumatic intraparenchymal hemorrhage without any focal neurological deficits: Recurrent fall. Initial CT scan with left temporal lobe intraparenchymal hemorrhage, follow-up CT scan is stable. Discussed with neurosurgery, recommended conservative management.  Aspirin on hold for 1 week. Work with PT OT.  All-time fall precautions.  Will need more supervision. Prophylaxis with Keppra for 7 days, changed to oral to complete therapy. PT OT recommended SNF.  Coronary artery disease: Continue metoprolol, statin and Ranexa.  Aspirin on hold as above.  Left wrist pain with a skin tear: Supportive treatment.  Without any fracture.  GERD: On PPI.  Dementia/failure to thrive.  Goal of care discussion: Discussed with patient's daughter who is her healthcare power of attorney.  Palliative care discussions initiated.  May need more care than she is currently receiving at assisted living facility.   DVT prophylaxis: SCDs Start: 04/18/21 1240   Code Status: Full code Family Communication: None today.  Palliative team communicating. Disposition  Plan: Status is: Inpatient  Remains inpatient appropriate because: Unsafe discharge plan.  Medically stable.         Consultants:  Neurosurgery, phone consultation Palliative care  Procedures:  None  Antimicrobials:  None   Subjective:  Seen and examined.  Mostly sleepy.  Answers few basic questions.  Nursing reported that they were able to give her medications.  Objective: Vitals:   04/20/21 1933 04/20/21 2314 04/21/21 0310 04/21/21 0753  BP: (!) 140/48 (!) 144/41 (!) 136/46 (!) 128/46  Pulse: (!) 52 (!) 55 (!) 53 (!) 53  Resp: 15 15 15 16   Temp: 98.4 F (36.9 C) 98.4 F (36.9 C) 98.2 F (36.8 C) 98.1 F (36.7 C)  TempSrc:      SpO2: 100% 100% 100% 96%  Weight:      Height:        Intake/Output Summary (Last 24 hours) at 04/21/2021 1133 Last data filed at 04/21/2021 0932 Gross per 24 hour  Intake 6 ml  Output 450 ml  Net -444 ml   Filed Weights   04/18/21 0630  Weight: 52.2 kg    Examination:  General exam: Appears calm and comfortable  Patient is chronically sick looking.  Frail and debilitated.  Thinly built.  Not in any distress. Mostly sleepy.  Answers basic questions. Respiratory system: Clear to auscultation. Respiratory effort normal. Cardiovascular system: S1 & S2 heard, RRR.  Gastrointestinal system: Soft.  Nontender. Central nervous system: Moves all extremities.  Barely awake.    Data Reviewed: I have personally reviewed following labs and imaging studies  CBC: Recent Labs  Lab 04/18/21 0758 04/19/21 0207 04/20/21 0228  WBC 7.1 4.6 7.0  NEUTROABS 5.1  --   --  HGB 11.1* 9.4* 9.9*  HCT 34.9* 28.9* 29.0*  MCV 99.4 101.4* 96.7  PLT 247 206 595   Basic Metabolic Panel: Recent Labs  Lab 04/18/21 0758 04/19/21 0207 04/20/21 0228  NA 135 135 133*  K 4.0 3.7 4.1  CL 105 107 104  CO2 23 24 22   GLUCOSE 114* 97 100*  BUN 37* 35* 30*  CREATININE 1.42* 1.19* 1.31*  CALCIUM 9.5 9.0 9.3   GFR: Estimated Creatinine  Clearance: 24.5 mL/min (A) (by C-G formula based on SCr of 1.31 mg/dL (H)). Liver Function Tests: Recent Labs  Lab 04/18/21 0758  AST 29  ALT 16  ALKPHOS 122  BILITOT 0.9  PROT 7.4  ALBUMIN 4.2   No results for input(s): LIPASE, AMYLASE in the last 168 hours. No results for input(s): AMMONIA in the last 168 hours. Coagulation Profile: No results for input(s): INR, PROTIME in the last 168 hours. Cardiac Enzymes: No results for input(s): CKTOTAL, CKMB, CKMBINDEX, TROPONINI in the last 168 hours. BNP (last 3 results) No results for input(s): PROBNP in the last 8760 hours. HbA1C: No results for input(s): HGBA1C in the last 72 hours. CBG: No results for input(s): GLUCAP in the last 168 hours. Lipid Profile: No results for input(s): CHOL, HDL, LDLCALC, TRIG, CHOLHDL, LDLDIRECT in the last 72 hours. Thyroid Function Tests: No results for input(s): TSH, T4TOTAL, FREET4, T3FREE, THYROIDAB in the last 72 hours. Anemia Panel: No results for input(s): VITAMINB12, FOLATE, FERRITIN, TIBC, IRON, RETICCTPCT in the last 72 hours. Sepsis Labs: No results for input(s): PROCALCITON, LATICACIDVEN in the last 168 hours.  Recent Results (from the past 240 hour(s))  Resp Panel by RT-PCR (Flu A&B, Covid) Nasopharyngeal Swab     Status: None   Collection Time: 04/18/21 10:41 AM   Specimen: Nasopharyngeal Swab; Nasopharyngeal(NP) swabs in vial transport medium  Result Value Ref Range Status   SARS Coronavirus 2 by RT PCR NEGATIVE NEGATIVE Final    Comment: (NOTE) SARS-CoV-2 target nucleic acids are NOT DETECTED.  The SARS-CoV-2 RNA is generally detectable in upper respiratory specimens during the acute phase of infection. The lowest concentration of SARS-CoV-2 viral copies this assay can detect is 138 copies/mL. A negative result does not preclude SARS-Cov-2 infection and should not be used as the sole basis for treatment or other patient management decisions. A negative result may occur with   improper specimen collection/handling, submission of specimen other than nasopharyngeal swab, presence of viral mutation(s) within the areas targeted by this assay, and inadequate number of viral copies(<138 copies/mL). A negative result must be combined with clinical observations, patient history, and epidemiological information. The expected result is Negative.  Fact Sheet for Patients:  EntrepreneurPulse.com.au  Fact Sheet for Healthcare Providers:  IncredibleEmployment.be  This test is no t yet approved or cleared by the [place] FDA and  has been authorized for detection and/or diagnosis of SARS-CoV-2 by FDA under an Emergency Use Authorization (EUA). This EUA will remain  in effect (meaning this test can be used) for the duration of the COVID-19 declaration under Section 564(b)(1) of the Act, 21 U.S.C.section 360bbb-3(b)(1), unless the authorization is terminated  or revoked sooner.       Influenza A by PCR NEGATIVE NEGATIVE Final   Influenza B by PCR NEGATIVE NEGATIVE Final    Comment: (NOTE) The Xpert Xpress SARS-CoV-2/FLU/RSV plus assay is intended as an aid in the diagnosis of influenza from Nasopharyngeal swab specimens and should not be used as a sole basis for treatment. Nasal washings  and aspirates are unacceptable for Xpert Xpress SARS-CoV-2/FLU/RSV testing.  Fact Sheet for Patients: EntrepreneurPulse.com.au  Fact Sheet for Healthcare Providers: IncredibleEmployment.be  This test is not yet approved or cleared by the [place] FDA and has been authorized for detection and/or diagnosis of SARS-CoV-2 by FDA under an Emergency Use Authorization (EUA). This EUA will remain in effect (meaning this test can be used) for the duration of the COVID-19 declaration under Section 564(b)(1) of the Act, 21 U.S.C. section 360bbb-3(b)(1), unless the authorization is terminated  or revoked.  Performed at [facility], [address] [place], [place] [address]          Radiology Studies: No results found.      Scheduled Meds:  atorvastatin  40 mg Oral QHS   cycloSPORINE  1 drop Both Eyes BID   DULoxetine  30 mg Oral Daily   feeding supplement  237 mL Oral BID BM   fesoterodine  4 mg Oral Daily   labetalol  10 mg Intravenous Once   lamoTRIgine  25 mg Oral BID   levETIRAcetam  500 mg Oral BID   lidocaine  1 patch Transdermal Q24H   metoprolol succinate  25 mg Oral Daily   multivitamin with minerals  1 tablet Oral Daily   polyethylene glycol  17 g Oral Daily   polyvinyl alcohol  1 drop Both Eyes BID   ranolazine  500 mg Oral BID   sodium chloride flush  3 mL Intravenous Q12H   Continuous Infusions:  sodium chloride       LOS: 3 days    Time spent: 25 minutes    Barb Merino, MD [facility] Pager [phone]

## 2021-04-22 NOTE — Progress Notes (Signed)
Physical Therapy Treatment Patient Details Name: Monique Burns MRN: [id number] DOB: Apr 16, 1931 Today's Date: 04/22/2021   History of Present Illness Monique Burns is an 89yoF who comes to [facility] on 04/18/21 from ALF after a fall. Per imaging studies no fracutres to Left elbow or wrist, skin injury only. CT scan of the head/cervical spine without contrast shows a small 4 mm acute intraparenchymal hemorrhage in the left temporal lobe. Pt admitted with ICH. Head CT repeated next day, no extension of bleed reported. PMH: Parkinson-like dementia c LUE tremor, frequent falls, CHF, GERD, HTN, CAD s/p PCI and stent x4, CABG, CKD-IV, hypoTSH, Rt temporal CVA, Left ribs 10-12 fractures August 2022.    PT Comments    At time of entry patient's daughter reports that she has been OOB in bedside chair x 2-3 hours. PT assisting with pt return to bed. The pt requires multiple sit<>stand transfers with education on safety and proper positioning for balance. She requires increased time and cues for all transfers and demonstrates lack of use of LUE during sit<>stand transfers and mobility d/t splint. At this time the pt will require SNF.    Recommendations for follow up therapy are one component of a multi-disciplinary discharge planning process, led by the attending physician.  Recommendations may be updated based on patient status, additional functional criteria and insurance authorization.  Follow Up Recommendations  Skilled nursing-short term rehab (<3 hours/day)     Assistance Recommended at Discharge Frequent or constant Supervision/Assistance  Equipment Recommendations       Recommendations for Other Services       Precautions / Restrictions Precautions Precautions: Fall Required Braces or Orthoses: Other Brace Other Brace: wrist splint Restrictions LUE Weight Bearing: Weight bearing as tolerated     Mobility  Bed Mobility Overal bed mobility: Needs Assistance Bed Mobility: Sit to Supine        Sit to supine: Min guard   General bed mobility comments: increased time to return to supine from the EOB, however able to do so without physical assistance.    Transfers Overall transfer level: Needs assistance Equipment used: Rolling walker (2 wheels);None Transfers: Sit to/from Stand Sit to Stand: Mod assist                Ambulation/Gait Ambulation/Gait assistance: Min assist (increased time for transfer back to bed.)   Assistive device: Rolling walker (2 wheels)             Stairs             Wheelchair Mobility    Modified Rankin (Stroke Patients Only)       Balance Overall balance assessment: History of Falls;Needs assistance   Sitting balance-Leahy Scale: Fair       Standing balance-Leahy Scale: Fair                              Cognition Arousal/Alertness: Awake/alert Behavior During Therapy: WFL for tasks assessed/performed Overall Cognitive Status: Impaired/Different from baseline                           Safety/Judgement: Decreased awareness of safety   Problem Solving: Slow processing;Requires verbal cues;Difficulty sequencing          Exercises Other Exercises Other Exercises: Pt and family educated on need to see all fingers within the brace. Upon entry the pt's 5 digit was not visble and had to be repositioned  for skin integumentary safety.    General Comments        Pertinent Vitals/Pain Pain Assessment: Faces Faces Pain Scale: Hurts a little bit Pain Location: generalized pain report Pain Descriptors / Indicators: Aching Pain Intervention(s): Monitored during session    Home Living                          Prior Function            PT Goals (current goals can now be found in the care plan section) Acute Rehab PT Goals Patient Stated Goal: Progress back to prior level of function PT Goal Formulation: Patient unable to participate in goal setting Progress towards PT  goals: Progressing toward goals    Frequency    Min 2X/week      PT Plan Current plan remains appropriate    Co-evaluation              AM-PAC PT "6 Clicks" Mobility   Outcome Measure  Help needed turning from your back to your side while in a flat bed without using bedrails?: A Lot Help needed moving from lying on your back to sitting on the side of a flat bed without using bedrails?: A Lot Help needed moving to and from a bed to a chair (including a wheelchair)?: A Lot Help needed standing up from a chair using your arms (e.g., wheelchair or bedside chair)?: A Lot Help needed to walk in hospital room?: A Lot Help needed climbing 3-5 steps with a railing? : Total 6 Click Score: 11    End of Session Equipment Utilized During Treatment: Gait belt Activity Tolerance: Patient tolerated treatment well Patient left: in bed;with call bell/phone within reach;with bed alarm set;with nursing/sitter in room;with family/visitor present Nurse Communication: Mobility status PT Visit Diagnosis: Unsteadiness on feet (R26.81);Other abnormalities of gait and mobility (R26.89);Repeated falls (R29.6);Muscle weakness (generalized) (M62.81)     Time: 5809-9833 PT Time Calculation (min) (ACUTE ONLY): 41 min  Charges:  $Gait Training: 8-22 mins $Therapeutic Activity: 23-37 mins                     2:53 PM, 04/22/21 Zoe Goonan A. Saverio Danker PT, DPT Physical Therapist - [facility]    Julis Haubner A Santanna Olenik 04/22/2021, 2:51 PM

## 2021-04-22 NOTE — TOC Progression Note (Signed)
Transition of Care [facility]) - Progression Note    Patient Details  Name: Monique Burns MRN: [id number] Date of Birth: [birth year]  Transition of Care [facility]) CM/SW [place], RN Phone Number: 04/22/2021, 12:40 PM  Clinical Narrative:   Family chose [facility] aware, can accept patient Monday as they will not have beds until then.  Care team aware.    Expected Discharge Plan: [place] Barriers to Discharge: Continued Medical Work up  Expected Discharge Plan and Services Expected Discharge Plan: [place]   Discharge Planning Services: CM Consult Post Acute Care Choice: [place] Living arrangements for the past 2 months: [place]                 DME Arranged: N/A DME Agency: NA       HH Arranged: NA HH Agency: NA         Social Determinants of Health (SDOH) Interventions    Readmission Risk Interventions No flowsheet data found.

## 2021-04-22 NOTE — Plan of Care (Signed)

## 2021-04-22 NOTE — Progress Notes (Signed)
PROGRESS NOTE    Monique Burns  WJX:[id number] DOB: [birth year] DOA: 04/18/2021 PCP: Adin Hector, MD    Brief Narrative:  87 year old with history of chronic combined heart failure, GERD, hypertension, coronary artery disease, CKD stage IV and hypothyroidism, dementia presented to hospital from her assisted living facility after sustaining a fall, left wrist pain.  Does have history of recurrent fall, walks with supervision and assistant with rolling walker.  In the emergency room skeletal survey negative, 4 mm intraparenchymal hemorrhage left temporal lobe with no significant edema.  Old T1 compression fracture.   Assessment & Plan:   Principal Problem:   Frequent falls Active Problems:   Brain bleed (HCC)   Left wrist sprain   GERD (gastroesophageal reflux disease)   CAD (coronary artery disease)   Malnutrition of moderate degree   Acute traumatic intraparenchymal hemorrhage without any focal neurological deficits: Recurrent fall. Initial CT scan with left temporal lobe intraparenchymal hemorrhage, follow-up CT scan is stable. Discussed with neurosurgery, recommended conservative management.  Aspirin on hold for 1 week. Work with PT OT.  All-time fall precautions.  Will need more supervision. Prophylaxis with Keppra for 7 days. PT OT recommended SNF.  Coronary artery disease: Continue metoprolol, statin and Ranexa.  Aspirin on hold as above.  Left wrist pain with a skin tear: Supportive treatment.  Without any fracture.  Splint on the left wrist.  GERD: On PPI.  Dementia/failure to thrive.  Goal of care discussion: Discussed with patient's daughter who is her healthcare power of attorney.  Palliative care discussions initiated.  Seen by palliative care. DNR/DNI. Recommended transfer to skilled nursing facility with palliative care follow-up.   DVT prophylaxis: SCDs Start: 04/18/21 1240   Code Status: Full code Family Communication: None. Disposition Plan:  Status is: Inpatient  Remains inpatient appropriate because: Unsafe discharge plan.  Medically stable. Patient with potential admission at a skilled nursing facility on Monday.    Consultants:  Neurosurgery, phone consultation Palliative care  Procedures:  None  Antimicrobials:  None   Subjective:  Patient seen and examined.  Today she was more awake and alert and able to participate in some conversation.  Objective: Vitals:   04/21/21 2340 04/22/21 0327 04/22/21 0719 04/22/21 1102  BP: (!) 132/51 (!) 131/49 (!) 144/54 (!) 110/40  Pulse: (!) 56 (!) 55 (!) 55 (!) 54  Resp: 16 20 16 16   Temp: 98.6 F (37 C) 98 F (36.7 C) 98 F (36.7 C) 97.8 F (36.6 C)  TempSrc:      SpO2: 98% 97% 99% 100%  Weight:      Height:        Intake/Output Summary (Last 24 hours) at 04/22/2021 1304 Last data filed at 04/22/2021 1001 Gross per 24 hour  Intake 600 ml  Output 300 ml  Net 300 ml   Filed Weights   04/18/21 0630  Weight: 52.2 kg    Examination:  General exam: Appears calm and comfortable  Patient is chronically sick looking.  Frail and debilitated.  Thinly built.  Not in any distress.  Awake with flat affect.  Answers few basic questions.  She tells me she is doing well. Respiratory system: Clear to auscultation. Respiratory effort normal. Cardiovascular system: S1 & S2 heard, RRR.  Gastrointestinal system: Soft.  Nontender. Central nervous system: Moves all extremities.   Left wrist on splint.  Minimal tenderness.    Data Reviewed: I have personally reviewed following labs and imaging studies  CBC: Recent Labs  Lab  04/18/21 0758 04/19/21 0207 04/20/21 0228  WBC 7.1 4.6 7.0  NEUTROABS 5.1  --   --   HGB 11.1* 9.4* 9.9*  HCT 34.9* 28.9* 29.0*  MCV 99.4 101.4* 96.7  PLT 247 206 001   Basic Metabolic Panel: Recent Labs  Lab 04/18/21 0758 04/19/21 0207 04/20/21 0228  NA 135 135 133*  K 4.0 3.7 4.1  CL 105 107 104  CO2 23 24 22   GLUCOSE 114* 97 100*   BUN 37* 35* 30*  CREATININE 1.42* 1.19* 1.31*  CALCIUM 9.5 9.0 9.3   GFR: Estimated Creatinine Clearance: 24.5 mL/min (A) (by C-G formula based on SCr of 1.31 mg/dL (H)). Liver Function Tests: Recent Labs  Lab 04/18/21 0758  AST 29  ALT 16  ALKPHOS 122  BILITOT 0.9  PROT 7.4  ALBUMIN 4.2   No results for input(s): LIPASE, AMYLASE in the last 168 hours. No results for input(s): AMMONIA in the last 168 hours. Coagulation Profile: No results for input(s): INR, PROTIME in the last 168 hours. Cardiac Enzymes: No results for input(s): CKTOTAL, CKMB, CKMBINDEX, TROPONINI in the last 168 hours. BNP (last 3 results) No results for input(s): PROBNP in the last 8760 hours. HbA1C: No results for input(s): HGBA1C in the last 72 hours. CBG: No results for input(s): GLUCAP in the last 168 hours. Lipid Profile: No results for input(s): CHOL, HDL, LDLCALC, TRIG, CHOLHDL, LDLDIRECT in the last 72 hours. Thyroid Function Tests: No results for input(s): TSH, T4TOTAL, FREET4, T3FREE, THYROIDAB in the last 72 hours. Anemia Panel: No results for input(s): VITAMINB12, FOLATE, FERRITIN, TIBC, IRON, RETICCTPCT in the last 72 hours. Sepsis Labs: No results for input(s): PROCALCITON, LATICACIDVEN in the last 168 hours.  Recent Results (from the past 240 hour(s))  Resp Panel by RT-PCR (Flu A&B, Covid) Nasopharyngeal Swab     Status: None   Collection Time: 04/18/21 10:41 AM   Specimen: Nasopharyngeal Swab; Nasopharyngeal(NP) swabs in vial transport medium  Result Value Ref Range Status   SARS Coronavirus 2 by RT PCR NEGATIVE NEGATIVE Final    Comment: (NOTE) SARS-CoV-2 target nucleic acids are NOT DETECTED.  The SARS-CoV-2 RNA is generally detectable in upper respiratory specimens during the acute phase of infection. The lowest concentration of SARS-CoV-2 viral copies this assay can detect is 138 copies/mL. A negative result does not preclude SARS-Cov-2 infection and should not be used as the  sole basis for treatment or other patient management decisions. A negative result may occur with  improper specimen collection/handling, submission of specimen other than nasopharyngeal swab, presence of viral mutation(s) within the areas targeted by this assay, and inadequate number of viral copies(<138 copies/mL). A negative result must be combined with clinical observations, patient history, and epidemiological information. The expected result is Negative.  Fact Sheet for Patients:  EntrepreneurPulse.com.au  Fact Sheet for Healthcare Providers:  IncredibleEmployment.be  This test is no t yet approved or cleared by the [place] FDA and  has been authorized for detection and/or diagnosis of SARS-CoV-2 by FDA under an Emergency Use Authorization (EUA). This EUA will remain  in effect (meaning this test can be used) for the duration of the COVID-19 declaration under Section 564(b)(1) of the Act, 21 U.S.C.section 360bbb-3(b)(1), unless the authorization is terminated  or revoked sooner.       Influenza A by PCR NEGATIVE NEGATIVE Final   Influenza B by PCR NEGATIVE NEGATIVE Final    Comment: (NOTE) The Xpert Xpress SARS-CoV-2/FLU/RSV plus assay is intended as an aid in  the diagnosis of influenza from Nasopharyngeal swab specimens and should not be used as a sole basis for treatment. Nasal washings and aspirates are unacceptable for Xpert Xpress SARS-CoV-2/FLU/RSV testing.  Fact Sheet for Patients: EntrepreneurPulse.com.au  Fact Sheet for Healthcare Providers: IncredibleEmployment.be  This test is not yet approved or cleared by the [place] FDA and has been authorized for detection and/or diagnosis of SARS-CoV-2 by FDA under an Emergency Use Authorization (EUA). This EUA will remain in effect (meaning this test can be used) for the duration of the COVID-19 declaration under Section 564(b)(1) of the  Act, 21 U.S.C. section 360bbb-3(b)(1), unless the authorization is terminated or revoked.  Performed at [facility], [address], [place], [place] [address]          Radiology Studies: No results found.      Scheduled Meds:  atorvastatin  40 mg Oral QHS   cycloSPORINE  1 drop Both Eyes BID   DULoxetine  30 mg Oral Daily   feeding supplement  237 mL Oral BID BM   fesoterodine  4 mg Oral Daily   labetalol  10 mg Intravenous Once   lamoTRIgine  25 mg Oral BID   lidocaine  1 patch Transdermal Q24H   metoprolol succinate  25 mg Oral Daily   multivitamin with minerals  1 tablet Oral Daily   polyethylene glycol  17 g Oral Daily   polyvinyl alcohol  1 drop Both Eyes BID   ranolazine  500 mg Oral BID   sodium chloride flush  3 mL Intravenous Q12H   Continuous Infusions:  sodium chloride       LOS: 4 days    Time spent: 25 minutes    Barb Merino, MD [facility] Pager [phone]

## 2021-04-22 NOTE — Progress Notes (Signed)
Occupational Therapy Treatment Patient Details Name: Monique Burns MRN: [id number] DOB: 1931-02-12 Today's Date: 04/22/2021   History of present illness Monique Burns is an 31yoF who comes to [facility] on 04/18/21 from ALF after a fall. Per imaging studies no fracutres to Left elbow or wrist, skin injury only. CT scan of the head/cervical spine without contrast shows a small 4 mm acute intraparenchymal hemorrhage in the left temporal lobe. Pt admitted with ICH. Head CT repeated next day, no extension of bleed reported. PMH: Parkinson-like dementia c LUE tremor, frequent falls, CHF, GERD, HTN, CAD s/p PCI and stent x4, CABG, CKD-IV, hypoTSH, Rt temporal CVA, Left ribs 10-12 fractures August 2022.   OT comments  Monique Burns was seen for OT treatment on this date. Upon arrival to room pt reclined in bed, agreeable to tx. Pt requires MAX A bed>chair. MAX A don/doff L wrist splint - of note pt wearing cock up splint not resting hand splint. MAX A for LBD reclined in chair. Pt making good progress toward goals. Pt continues to benefit from skilled OT services to maximize return to PLOF and minimize risk of future falls, injury, caregiver burden, and readmission. Will continue to follow POC. Discharge recommendation remains appropriate.     Recommendations for follow up therapy are one component of a multi-disciplinary discharge planning process, led by the attending physician.  Recommendations may be updated based on patient status, additional functional criteria and insurance authorization.    Follow Up Recommendations  Skilled nursing-short term rehab (<3 hours/day)    Assistance Recommended at Discharge Frequent or constant Supervision/Assistance  Equipment Recommendations  Other (comment) (defer)    Recommendations for Other Services      Precautions / Restrictions Precautions Precautions: Fall Required Braces or Orthoses: Other Brace Other Brace: wrist splint Restrictions Weight Bearing  Restrictions: Yes LUE Weight Bearing: Weight bearing as tolerated       Mobility Bed Mobility Overal bed mobility: Needs Assistance Bed Mobility: Supine to Sit     Supine to sit: Min assist   General bed mobility comments: increased time to return to supine from the EOB, however able to do so without physical assistance.    Transfers Overall transfer level: Needs assistance Equipment used: 1 person hand held assist Transfers: Sit to/from Stand Sit to Stand: Mod assist Stand pivot transfers: Max assist               Balance Overall balance assessment: History of Falls;Needs assistance Sitting-balance support: No upper extremity supported Sitting balance-Leahy Scale: Fair     Standing balance support: Bilateral upper extremity supported;During functional activity;Reliant on assistive device for balance Standing balance-Leahy Scale: Poor                             ADL either performed or assessed with clinical judgement   ADL Overall ADL's : Needs assistance/impaired                                       General ADL Comments: MAX A for ADL t/f. MAX A don/doff L wrist splint - of note pt wearing cock up splint not resting hand splint. MAX A for LBD at bed level      Cognition Arousal/Alertness: Lethargic Behavior During Therapy: WFL for tasks assessed/performed Overall Cognitive Status: History of cognitive impairments - at baseline  Safety/Judgement: Decreased awareness of safety   Problem Solving: Slow processing;Requires verbal cues;Difficulty sequencing            Exercises Exercises: Other exercises Other Exercises Other Exercises: Pt educated re; ECS, falls prevention Other Exercises: Sit<>stand, LBD, SPT, sitting/standing balance/toelrance           Pertinent Vitals/ Pain       Pain Assessment: No/denies pain Faces Pain Scale: Hurts a little bit Pain Location: generalized pain  report Pain Descriptors / Indicators: Aching Pain Intervention(s): Monitored during session   Frequency  Min 2X/week        Progress Toward Goals  OT Goals(current goals can now be found in the care plan section)  Progress towards OT goals: Progressing toward goals  Acute Rehab OT Goals Patient Stated Goal: to sleep OT Goal Formulation: With patient/family Time For Goal Achievement: 05/03/21 Potential to Achieve Goals: Good ADL Goals Pt Will Perform Grooming: with min guard assist;standing Pt Will Perform Lower Body Dressing: with supervision;sit to/from stand Pt Will Transfer to Toilet: bedside commode;ambulating;with min assist  Plan Discharge plan remains appropriate;Frequency remains appropriate    Co-evaluation                 AM-PAC OT "6 Clicks" Daily Activity     Outcome Measure   Help from another person eating meals?: A Little Help from another person taking care of personal grooming?: A Little Help from another person toileting, which includes using toliet, bedpan, or urinal?: A Lot Help from another person bathing (including washing, rinsing, drying)?: A Lot Help from another person to put on and taking off regular upper body clothing?: A Little Help from another person to put on and taking off regular lower body clothing?: A Lot 6 Click Score: 15    End of Session    OT Visit Diagnosis: Unsteadiness on feet (R26.81);Other abnormalities of gait and mobility (R26.89);Repeated falls (R29.6);Muscle weakness (generalized) (M62.81);History of falling (Z91.81)   Activity Tolerance Patient tolerated treatment well   Patient Left in chair;with call bell/phone within reach;with chair alarm set   Nurse Communication          Time: 1031-5945 OT Time Calculation (min): 12 min  Charges: OT General Charges $OT Visit: 1 Visit OT Treatments $Self Care/Home Management : 8-22 mins  Monique Burns, M.S. OTR/L  04/22/21, 4:16 PM  ascom [phone]

## 2021-04-22 NOTE — Progress Notes (Signed)
Nutrition Follow-up  DOCUMENTATION CODES:   Non-severe (moderate) malnutrition in context of chronic illness, Underweight  INTERVENTION:   -Continue regular diet -Continue MVI with minerals daily -Continue Ensure Enlive po BID, each supplement provides 350 kcal and 20 grams of protein  -Continue Magic cup TID with meals, each supplement provides 290 kcal and 9 grams of protein   NUTRITION DIAGNOSIS:   Moderate Malnutrition related to chronic illness (CHF) as evidenced by mild fat depletion, moderate fat depletion, moderate muscle depletion, severe muscle depletion.  Ongoing  GOAL:   Patient will meet greater than or equal to 90% of their needs  Progressing   MONITOR:   PO intake, Supplement acceptance, Labs, Weight trends, Skin, I & O's  REASON FOR ASSESSMENT:   Malnutrition Screening Tool    ASSESSMENT:   Monique Burns is a 87 y.o. female with medical history significant for CHF, GERD, hypertension, coronary artery disease, stage IV chronic kidney disease, hypothyroidism who presents to the ER via EMS for evaluation following a fall.  Patient was noted to have significant skin tear involving the left hand and per EMS concerns for a fractured left wrist.  Reviewed I/O's: -93 ml x 24 hours and -997 ml since admission  UOP: 575 ml x 24 hours  Pt with improved meal intake. Noted meal completions 50-100%. Pt is taking Ensure Enlive supplements.    Medications reviewed and include miralax.   Palliative care following; plan to discharge to SNF with palliative care services.   Labs reviewed: Na: 133.    Diet Order:   Diet Order             Diet regular Room service appropriate? Yes; Fluid consistency: Thin  Diet effective now                   EDUCATION NEEDS:   Education needs have been addressed  Skin:  Skin Assessment: Skin Integrity Issues: Skin Integrity Issues:: Other (Comment) Other: skin tear to lt arm  Last BM:  04/22/21  Height:   Ht  Readings from Last 1 Encounters:  04/18/21 5\' 7"  (1.702 m)    Weight:   Wt Readings from Last 1 Encounters:  04/18/21 52.2 kg    Ideal Body Weight:  61.4 kg  BMI:  Body mass index is 18.02 kg/m.  Estimated Nutritional Needs:   Kcal:  1600-1800  Protein:  75-90 grams  Fluid:  > 1.6 L    Loistine Chance, RD, LDN, [place] Registered Dietitian II Certified Diabetes Care and Education Specialist Please refer to [facility] for RD and/or RD on-call/weekend/after hours pager

## 2021-04-23 NOTE — Plan of Care (Signed)
No new changes in assessment  PLAN OF CARE ONGOING Problem: Education: Goal: Knowledge of General Education information will improve Description: Including pain rating scale, medication(s)/side effects and non-pharmacologic comfort measures Outcome: Progressing   Problem: Health Behavior/Discharge Planning: Goal: Ability to manage health-related needs will improve Outcome: Progressing   Problem: Clinical Measurements: Goal: Ability to maintain clinical measurements within normal limits will improve Outcome: Progressing Goal: Will remain free from infection Outcome: Progressing Goal: Diagnostic test results will improve Outcome: Progressing Goal: Respiratory complications will improve Outcome: Progressing Goal: Cardiovascular complication will be avoided Outcome: Progressing   Problem: Activity: Goal: Risk for activity intolerance will decrease Outcome: Progressing   Problem: Nutrition: Goal: Adequate nutrition will be maintained Outcome: Progressing   Problem: Coping: Goal: Level of anxiety will decrease Outcome: Progressing   Problem: Elimination: Goal: Will not experience complications related to bowel motility Outcome: Progressing Goal: Will not experience complications related to urinary retention Outcome: Progressing   Problem: Pain Managment: Goal: General experience of comfort will improve Outcome: Progressing   Problem: Safety: Goal: Ability to remain free from injury will improve Outcome: Progressing   Problem: Skin Integrity: Goal: Risk for impaired skin integrity will decrease Outcome: Progressing

## 2021-04-23 NOTE — Progress Notes (Signed)
PROGRESS NOTE    Monique Burns  JOI:[id number] DOB: 12/17/1930 DOA: 04/18/2021 PCP: Adin Hector, MD    Brief Narrative:  87 year old with history of chronic combined heart failure, GERD, hypertension, coronary artery disease, CKD stage IV and hypothyroidism, dementia presented to hospital from her assisted living facility after sustaining a fall, left wrist pain.  Does have history of recurrent fall, walks with supervision and assistant with rolling walker.  In the emergency room skeletal survey negative, 4 mm intraparenchymal hemorrhage left temporal lobe with no significant edema.  Old T1 compression fracture.   Assessment & Plan:   Principal Problem:   Frequent falls Active Problems:   Brain bleed (HCC)   Left wrist sprain   GERD (gastroesophageal reflux disease)   CAD (coronary artery disease)   Malnutrition of moderate degree   Acute traumatic intraparenchymal hemorrhage without any focal neurological deficits: Recurrent fall. Initial CT scan with left temporal lobe intraparenchymal hemorrhage, follow-up CT scan is stable. Discussed with neurosurgery, recommended conservative management.  Aspirin on hold for 1 week. Work with PT OT.  All-time fall precautions.  Will need more supervision. Prophylaxis with Keppra for 7 days. PT OT recommended SNF.  Coronary artery disease: Continue metoprolol, statin and Ranexa.  Aspirin on hold as above.  Resume on discharge 11/19.  Left wrist pain with a skin tear: Supportive treatment.  Without any fracture.  Splint on the left wrist.  GERD: On PPI.  Dementia/failure to thrive.  Goal of care discussion: Discussed with patient's daughter who is her healthcare power of attorney.  Palliative care discussions initiated.  Seen by palliative care. DNR/DNI. Recommended transfer to skilled nursing facility with palliative care follow-up.   DVT prophylaxis: SCDs Start: 04/18/21 1240   Code Status: Full code Family Communication:  None today. Disposition Plan: Status is: Inpatient  Remains inpatient appropriate because: Unsafe discharge plan.  Medically stable. Patient with potential admission at a skilled nursing facility on Monday.    Consultants:  Neurosurgery, phone consultation Palliative care  Procedures:  None  Antimicrobials:  None   Subjective:  Patient seen and examined.  Denies any complaints.  No new events.  Remained quiet and calm overnight.  Objective: Vitals:   04/22/21 1507 04/22/21 2026 04/23/21 0420 04/23/21 0738  BP: (!) 124/54 (!) 120/47 (!) 130/44 (!) 165/51  Pulse: (!) 55 (!) 58 (!) 56 (!) 55  Resp: 16 17 17 15   Temp: 97.9 F (36.6 C) 99 F (37.2 C) (!) 97.3 F (36.3 C) 97.9 F (36.6 C)  TempSrc:      SpO2: 100% 98% 95% 97%  Weight:      Height:        Intake/Output Summary (Last 24 hours) at 04/23/2021 1056 Last data filed at 04/23/2021 1039 Gross per 24 hour  Intake 360 ml  Output --  Net 360 ml   Filed Weights   04/18/21 0630  Weight: 52.2 kg    Examination:  General exam: Appears calm and comfortable  Patient is chronically sick looking.  Frail and debilitated.  Thinly built.  Not in any distress.  Awake with flat affect.  Answers basic questions.   Respiratory system: Clear to auscultation. Respiratory effort normal. Cardiovascular system: S1 & S2 heard, RRR.  Gastrointestinal system: Soft.  Nontender. Central nervous system: Moves all extremities.   Left wrist on splint.  Minimal tenderness.    Data Reviewed: I have personally reviewed following labs and imaging studies  CBC: Recent Labs  Lab 04/18/21 0758  04/19/21 0207 04/20/21 0228  WBC 7.1 4.6 7.0  NEUTROABS 5.1  --   --   HGB 11.1* 9.4* 9.9*  HCT 34.9* 28.9* 29.0*  MCV 99.4 101.4* 96.7  PLT 247 206 706   Basic Metabolic Panel: Recent Labs  Lab 04/18/21 0758 04/19/21 0207 04/20/21 0228  NA 135 135 133*  K 4.0 3.7 4.1  CL 105 107 104  CO2 23 24 22   GLUCOSE 114* 97 100*  BUN  37* 35* 30*  CREATININE 1.42* 1.19* 1.31*  CALCIUM 9.5 9.0 9.3   GFR: Estimated Creatinine Clearance: 24.5 mL/min (A) (by C-G formula based on SCr of 1.31 mg/dL (H)). Liver Function Tests: Recent Labs  Lab 04/18/21 0758  AST 29  ALT 16  ALKPHOS 122  BILITOT 0.9  PROT 7.4  ALBUMIN 4.2   No results for input(s): LIPASE, AMYLASE in the last 168 hours. No results for input(s): AMMONIA in the last 168 hours. Coagulation Profile: No results for input(s): INR, PROTIME in the last 168 hours. Cardiac Enzymes: No results for input(s): CKTOTAL, CKMB, CKMBINDEX, TROPONINI in the last 168 hours. BNP (last 3 results) No results for input(s): PROBNP in the last 8760 hours. HbA1C: No results for input(s): HGBA1C in the last 72 hours. CBG: No results for input(s): GLUCAP in the last 168 hours. Lipid Profile: No results for input(s): CHOL, HDL, LDLCALC, TRIG, CHOLHDL, LDLDIRECT in the last 72 hours. Thyroid Function Tests: No results for input(s): TSH, T4TOTAL, FREET4, T3FREE, THYROIDAB in the last 72 hours. Anemia Panel: No results for input(s): VITAMINB12, FOLATE, FERRITIN, TIBC, IRON, RETICCTPCT in the last 72 hours. Sepsis Labs: No results for input(s): PROCALCITON, LATICACIDVEN in the last 168 hours.  Recent Results (from the past 240 hour(s))  Resp Panel by RT-PCR (Flu A&B, Covid) Nasopharyngeal Swab     Status: None   Collection Time: 04/18/21 10:41 AM   Specimen: Nasopharyngeal Swab; Nasopharyngeal(NP) swabs in vial transport medium  Result Value Ref Range Status   SARS Coronavirus 2 by RT PCR NEGATIVE NEGATIVE Final    Comment: (NOTE) SARS-CoV-2 target nucleic acids are NOT DETECTED.  The SARS-CoV-2 RNA is generally detectable in upper respiratory specimens during the acute phase of infection. The lowest concentration of SARS-CoV-2 viral copies this assay can detect is 138 copies/mL. A negative result does not preclude SARS-Cov-2 infection and should not be used as the sole  basis for treatment or other patient management decisions. A negative result may occur with  improper specimen collection/handling, submission of specimen other than nasopharyngeal swab, presence of viral mutation(s) within the areas targeted by this assay, and inadequate number of viral copies(<138 copies/mL). A negative result must be combined with clinical observations, patient history, and epidemiological information. The expected result is Negative.  Fact Sheet for Patients:  EntrepreneurPulse.com.au  Fact Sheet for Healthcare Providers:  IncredibleEmployment.be  This test is no t yet approved or cleared by the [place] FDA and  has been authorized for detection and/or diagnosis of SARS-CoV-2 by FDA under an Emergency Use Authorization (EUA). This EUA will remain  in effect (meaning this test can be used) for the duration of the COVID-19 declaration under Section 564(b)(1) of the Act, 21 U.S.C.section 360bbb-3(b)(1), unless the authorization is terminated  or revoked sooner.       Influenza A by PCR NEGATIVE NEGATIVE Final   Influenza B by PCR NEGATIVE NEGATIVE Final    Comment: (NOTE) The Xpert Xpress SARS-CoV-2/FLU/RSV plus assay is intended as an aid in the diagnosis  of influenza from Nasopharyngeal swab specimens and should not be used as a sole basis for treatment. Nasal washings and aspirates are unacceptable for Xpert Xpress SARS-CoV-2/FLU/RSV testing.  Fact Sheet for Patients: EntrepreneurPulse.com.au  Fact Sheet for Healthcare Providers: IncredibleEmployment.be  This test is not yet approved or cleared by the [place] FDA and has been authorized for detection and/or diagnosis of SARS-CoV-2 by FDA under an Emergency Use Authorization (EUA). This EUA will remain in effect (meaning this test can be used) for the duration of the COVID-19 declaration under Section 564(b)(1) of the Act,  21 U.S.C. section 360bbb-3(b)(1), unless the authorization is terminated or revoked.  Performed at [facility], [address] [place], [place] [address]          Radiology Studies: No results found.      Scheduled Meds:  atorvastatin  40 mg Oral QHS   cycloSPORINE  1 drop Both Eyes BID   DULoxetine  30 mg Oral Daily   feeding supplement  237 mL Oral BID BM   fesoterodine  4 mg Oral Daily   labetalol  10 mg Intravenous Once   lamoTRIgine  25 mg Oral BID   lidocaine  1 patch Transdermal Q24H   metoprolol succinate  25 mg Oral Daily   multivitamin with minerals  1 tablet Oral Daily   polyethylene glycol  17 g Oral Daily   polyvinyl alcohol  1 drop Both Eyes BID   ranolazine  500 mg Oral BID   sodium chloride flush  3 mL Intravenous Q12H   Continuous Infusions:  sodium chloride       LOS: 5 days    Time spent: 25 minutes    Barb Merino, MD [facility] Pager [phone]

## 2021-04-23 NOTE — Plan of Care (Signed)

## 2021-04-24 NOTE — Progress Notes (Signed)
Insurance authorization started. Candace Cruise [id number]

## 2021-04-24 NOTE — Plan of Care (Signed)
Patient sleeping between care. No new changes in assessment.  PLAN OF CARE ONGOING Problem: Education: Goal: Knowledge of General Education information will improve Description: Including pain rating scale, medication(s)/side effects and non-pharmacologic comfort measures Outcome: Progressing   Problem: Health Behavior/Discharge Planning: Goal: Ability to manage health-related needs will improve Outcome: Progressing   Problem: Clinical Measurements: Goal: Ability to maintain clinical measurements within normal limits will improve Outcome: Progressing Goal: Will remain free from infection Outcome: Progressing Goal: Diagnostic test results will improve Outcome: Progressing Goal: Respiratory complications will improve Outcome: Progressing Goal: Cardiovascular complication will be avoided Outcome: Progressing   Problem: Activity: Goal: Risk for activity intolerance will decrease Outcome: Progressing   Problem: Nutrition: Goal: Adequate nutrition will be maintained Outcome: Progressing   Problem: Coping: Goal: Level of anxiety will decrease Outcome: Progressing   Problem: Elimination: Goal: Will not experience complications related to bowel motility Outcome: Progressing Goal: Will not experience complications related to urinary retention Outcome: Progressing   Problem: Pain Managment: Goal: General experience of comfort will improve Outcome: Progressing   Problem: Safety: Goal: Ability to remain free from injury will improve Outcome: Progressing   Problem: Skin Integrity: Goal: Risk for impaired skin integrity will decrease Outcome: Progressing

## 2021-04-24 NOTE — Progress Notes (Signed)
PROGRESS NOTE    MIRELY PANGLE  WCH:[id number] DOB: 08/21/1930 DOA: 04/18/2021 PCP: Adin Hector, MD    Brief Narrative:  87 year old with history of chronic combined heart failure, GERD, hypertension, coronary artery disease, CKD stage IV and hypothyroidism, dementia presented to hospital from her assisted living facility after sustaining a fall, left wrist pain.  Does have history of recurrent fall, walks with supervision and assistant with rolling walker.  In the emergency room skeletal survey negative, 4 mm intraparenchymal hemorrhage left temporal lobe with no significant edema.  Old T1 compression fracture.   Assessment & Plan:   Principal Problem:   Frequent falls Active Problems:   Brain bleed (HCC)   Left wrist sprain   GERD (gastroesophageal reflux disease)   CAD (coronary artery disease)   Malnutrition of moderate degree   Acute traumatic intraparenchymal hemorrhage without any focal neurological deficits: Recurrent fall. Initial CT scan with left temporal lobe intraparenchymal hemorrhage, follow-up CT scan is stable. Discussed with neurosurgery, recommended conservative management.  Aspirin on hold for 1 week. Work with PT OT.  All-time fall precautions.  Will need more supervision. Prophylaxis with Keppra for 7 days. PT OT recommended SNF.  Coronary artery disease: Continue metoprolol, statin and Ranexa.  Aspirin on hold as above.  Resume on discharge 11/19.  Left wrist pain with a skin tear: Supportive treatment.  Without any fracture.  Splint on the left wrist.  GERD: On PPI.  Dementia/failure to thrive.  Goal of care discussion: Discussed with patient's daughter who is her healthcare power of attorney.  Palliative care discussions initiated.  Seen by palliative care. DNR/DNI. Recommended transfer to skilled nursing facility with palliative care follow-up.   DVT prophylaxis: SCDs Start: 04/18/21 1240   Code Status: Full code Family Communication:  None today. Disposition Plan: Status is: Inpatient  Remains inpatient appropriate because: Unsafe discharge plan.  Medically stable. Patient with potential admission at a skilled nursing facility on Monday.    Consultants:  Neurosurgery, phone consultation Palliative care  Procedures:  None  Antimicrobials:  None   Subjective:  Patient seen and examined.  Denies any complaints.  No new events.  Remained quiet and calm overnight.  Objective: Vitals:   04/23/21 1130 04/23/21 1523 04/23/21 2059 04/24/21 0406  BP: (!) 120/39 (!) 134/44 (!) 133/55 (!) 144/42  Pulse: (!) 55 (!) 52 60 (!) 57  Resp: 16 16 16 16   Temp: 97.7 F (36.5 C) 98.2 F (36.8 C) 98.2 F (36.8 C) 98.2 F (36.8 C)  TempSrc:      SpO2: 98% 99% 99% 98%  Weight:      Height:        Intake/Output Summary (Last 24 hours) at 04/24/2021 0709 Last data filed at 04/24/2021 0544 Gross per 24 hour  Intake 120 ml  Output 500 ml  Net -380 ml    Filed Weights   04/18/21 0630  Weight: 52.2 kg    Examination:  General exam: Appears calm and comfortable  Patient is chronically sick looking.  Frail and debilitated.  Thinly built.  Not in any distress.  Awake with flat affect.  Answers basic questions.   Respiratory system: Clear to auscultation. Respiratory effort normal. Cardiovascular system: S1 & S2 heard, RRR.  Gastrointestinal system: Soft.  Nontender. Central nervous system: Moves all extremities.   Left wrist on splint.  Minimal tenderness.    Data Reviewed: I have personally reviewed following labs and imaging studies  CBC: Recent Labs  Lab 04/18/21 0758  04/19/21 0207 04/20/21 0228  WBC 7.1 4.6 7.0  NEUTROABS 5.1  --   --   HGB 11.1* 9.4* 9.9*  HCT 34.9* 28.9* 29.0*  MCV 99.4 101.4* 96.7  PLT 247 206 176    Basic Metabolic Panel: Recent Labs  Lab 04/18/21 0758 04/19/21 0207 04/20/21 0228  NA 135 135 133*  K 4.0 3.7 4.1  CL 105 107 104  CO2 23 24 22   GLUCOSE 114* 97 100*   BUN 37* 35* 30*  CREATININE 1.42* 1.19* 1.31*  CALCIUM 9.5 9.0 9.3    GFR: Estimated Creatinine Clearance: 24.5 mL/min (A) (by C-G formula based on SCr of 1.31 mg/dL (H)). Liver Function Tests: Recent Labs  Lab 04/18/21 0758  AST 29  ALT 16  ALKPHOS 122  BILITOT 0.9  PROT 7.4  ALBUMIN 4.2    No results for input(s): LIPASE, AMYLASE in the last 168 hours. No results for input(s): AMMONIA in the last 168 hours. Coagulation Profile: No results for input(s): INR, PROTIME in the last 168 hours. Cardiac Enzymes: No results for input(s): CKTOTAL, CKMB, CKMBINDEX, TROPONINI in the last 168 hours. BNP (last 3 results) No results for input(s): PROBNP in the last 8760 hours. HbA1C: No results for input(s): HGBA1C in the last 72 hours. CBG: No results for input(s): GLUCAP in the last 168 hours. Lipid Profile: No results for input(s): CHOL, HDL, LDLCALC, TRIG, CHOLHDL, LDLDIRECT in the last 72 hours. Thyroid Function Tests: No results for input(s): TSH, T4TOTAL, FREET4, T3FREE, THYROIDAB in the last 72 hours. Anemia Panel: No results for input(s): VITAMINB12, FOLATE, FERRITIN, TIBC, IRON, RETICCTPCT in the last 72 hours. Sepsis Labs: No results for input(s): PROCALCITON, LATICACIDVEN in the last 168 hours.  Recent Results (from the past 240 hour(s))  Resp Panel by RT-PCR (Flu A&B, Covid) Nasopharyngeal Swab     Status: None   Collection Time: 04/18/21 10:41 AM   Specimen: Nasopharyngeal Swab; Nasopharyngeal(NP) swabs in vial transport medium  Result Value Ref Range Status   SARS Coronavirus 2 by RT PCR NEGATIVE NEGATIVE Final    Comment: (NOTE) SARS-CoV-2 target nucleic acids are NOT DETECTED.  The SARS-CoV-2 RNA is generally detectable in upper respiratory specimens during the acute phase of infection. The lowest concentration of SARS-CoV-2 viral copies this assay can detect is 138 copies/mL. A negative result does not preclude SARS-Cov-2 infection and should not be used as  the sole basis for treatment or other patient management decisions. A negative result may occur with  improper specimen collection/handling, submission of specimen other than nasopharyngeal swab, presence of viral mutation(s) within the areas targeted by this assay, and inadequate number of viral copies(<138 copies/mL). A negative result must be combined with clinical observations, patient history, and epidemiological information. The expected result is Negative.  Fact Sheet for Patients:  EntrepreneurPulse.com.au  Fact Sheet for Healthcare Providers:  IncredibleEmployment.be  This test is no t yet approved or cleared by the [place] FDA and  has been authorized for detection and/or diagnosis of SARS-CoV-2 by FDA under an Emergency Use Authorization (EUA). This EUA will remain  in effect (meaning this test can be used) for the duration of the COVID-19 declaration under Section 564(b)(1) of the Act, 21 U.S.C.section 360bbb-3(b)(1), unless the authorization is terminated  or revoked sooner.       Influenza A by PCR NEGATIVE NEGATIVE Final   Influenza B by PCR NEGATIVE NEGATIVE Final    Comment: (NOTE) The Xpert Xpress SARS-CoV-2/FLU/RSV plus assay is intended as an aid  in the diagnosis of influenza from Nasopharyngeal swab specimens and should not be used as a sole basis for treatment. Nasal washings and aspirates are unacceptable for Xpert Xpress SARS-CoV-2/FLU/RSV testing.  Fact Sheet for Patients: EntrepreneurPulse.com.au  Fact Sheet for Healthcare Providers: IncredibleEmployment.be  This test is not yet approved or cleared by the [place] FDA and has been authorized for detection and/or diagnosis of SARS-CoV-2 by FDA under an Emergency Use Authorization (EUA). This EUA will remain in effect (meaning this test can be used) for the duration of the COVID-19 declaration under Section 564(b)(1) of  the Act, 21 U.S.C. section 360bbb-3(b)(1), unless the authorization is terminated or revoked.  Performed at [facility], [address], [place], [place] [address]           Radiology Studies: No results found.      Scheduled Meds:  atorvastatin  40 mg Oral QHS   cycloSPORINE  1 drop Both Eyes BID   DULoxetine  30 mg Oral Daily   feeding supplement  237 mL Oral BID BM   fesoterodine  4 mg Oral Daily   labetalol  10 mg Intravenous Once   lamoTRIgine  25 mg Oral BID   lidocaine  1 patch Transdermal Q24H   metoprolol succinate  25 mg Oral Daily   multivitamin with minerals  1 tablet Oral Daily   polyethylene glycol  17 g Oral Daily   polyvinyl alcohol  1 drop Both Eyes BID   ranolazine  500 mg Oral BID   sodium chloride flush  3 mL Intravenous Q12H   Continuous Infusions:  sodium chloride       LOS: 6 days    Time spent: 25 minutes    Little Ishikawa, DO [facility]  Pager: Secure chat After 7p see Amion for cross coverage

## 2021-04-25 LAB — RESP PANEL BY RT-PCR (FLU A&B, COVID) ARPGX2
Influenza A by PCR: NEGATIVE
Influenza B by PCR: NEGATIVE
SARS Coronavirus 2 by RT PCR: NEGATIVE

## 2021-04-25 MED ORDER — ORAL CARE MOUTH RINSE
15.0000 mL | Freq: Two times a day (BID) | OROMUCOSAL | Status: DC
  Administered 2021-04-25: 09:00:00 15 mL via OROMUCOSAL

## 2021-04-25 NOTE — Progress Notes (Signed)
HR 59, MD made aware, orders received to hold Metoprolol.

## 2021-04-25 NOTE — Progress Notes (Addendum)
Report given to [facility], pt waiting on EMS

## 2021-04-25 NOTE — TOC Progression Note (Addendum)
Transition of Care [facility]) - Progression Note    Patient Details  Name: Monique Burns MRN: [id number] Date of Birth: 1931/03/27  Transition of [facility]) CM/SW [place], RN Phone Number: 04/25/2021, 9:39 AM  Clinical Narrative:   received a call from [facility] ref number [id number], they stated that the prior level of function is too vague, I spoke with the physical therapist,  We reviewed the Evaluation the prior level of function is not physical it is cognitive, she needed step by step cuing, and was stand by assist.   She stated that she will approve the auth for 3 days start date today  The patient is expected to go to [facility] today room 508, Covid test to be completed prior to DC, Called her daughter [name] and notified of DC     Expected Discharge Plan: Skilled Nursing Facility Barriers to Discharge: Continued Medical Work up  Expected Discharge Plan and Services Expected Discharge Plan: [place]   Discharge Planning Services: CM Consult Post Acute Care Choice: [place] Living arrangements for the past 2 months: [place] Expected Discharge Date: 04/25/21               DME Arranged: N/A DME Agency: NA       HH Arranged: NA HH Agency: NA         Social Determinants of Health (SDOH) Interventions    Readmission Risk Interventions No flowsheet data found.

## 2021-04-25 NOTE — Care Management Important Message (Signed)
Important Message  Patient Details  Name: JAELY SILMAN MRN: [id number] Date of Birth: Jun 03, 1930   Medicare Important Message Given:  Yes  I reviewed the Important Message from Medicare with the patient's POA, Wynelle Beckmann by phone [phone]) and she is in agreement with the discharge today. I asked if she would like me to send her a copy and she replied, no.  I thanked her for her time.  Juliann Pulse A Charmain Diosdado 04/25/2021, 10:50 AM

## 2021-04-25 NOTE — Discharge Summary (Signed)
Physician Discharge Summary  Monique Burns NKN:[id number] DOB: [birth year] DOA: 04/18/2021  PCP: Adin Hector, MD  Admit date: 04/18/2021 Discharge date: 04/25/2021  Admitted From: SNF Disposition: SNF  Recommendations for Outpatient Follow-up:  Follow up with PCP in 1-2 weeks Please obtain BMP/CBC in one week  Discharge Condition: Stable CODE STATUS: DNR Diet recommendation: Low-salt low-fat diet  Brief/Interim Summary: 87 year old with history of chronic combined heart failure, GERD, hypertension, coronary artery disease, CKD stage IV and hypothyroidism, dementia presented to hospital from her assisted living facility after sustaining a fall, left wrist pain.  Does have history of recurrent fall, walks with supervision and assistant with rolling walker.  In the emergency room skeletal survey negative, 4 mm intraparenchymal hemorrhage left temporal lobe with no significant edema.  Old T1 compression fracture.  Patient had notable mechanical fall with acute traumatic intraparenchymal hemorrhage without neurological deficits, followed closely in the inpatient status without any further issues or episodes.  Neurosurgery recommending conservative management, given her deficits, stable bleeding otherwise no ongoing issues will discharge back to facility for ongoing routine medical care.  Patient is cleared to resume aspirin given his now been 1 week since intake, no need for Keppra per previous discussion with neurosurgery otherwise stable and agreeable for discharge back to facility.     Assessment & Plan:   Acute recurrent mechanical fall with traumatic intraparenchymal hemorrhage without any focal neurological deficits   Coronary artery disease: Continue metoprolol, statin and Ranexa.  Aspirin resumed.   Left wrist pain with a skin tear: Supportive treatment.  Without any fracture.  Splint on the left wrist.   GERD: On PPI.   Dementia/failure to thrive.  Goal of care  discussion: Discussed with patient's daughter who is her healthcare power of attorney.  Palliative care discussions initiated.  Seen by palliative care. DNR/DNI. Recommended transfer to skilled nursing facility with palliative care follow-up.  Discharge Instructions   Allergies as of 04/25/2021       Reactions   Brilinta [ticagrelor] Other (See Comments)   "caused stent to fail"   Imdur [isosorbide Nitrate] Other (See Comments)   Headache        Medication List     TAKE these medications    acetaminophen 500 MG tablet Commonly known as: TYLENOL Take 1,000 mg by mouth in the morning and at bedtime.   acetaminophen 500 MG tablet Commonly known as: TYLENOL Take 500 mg by mouth every 6 (six) hours as needed for mild pain or moderate pain.   aspirin EC 81 MG tablet Take 81 mg by mouth daily.   atorvastatin 40 MG tablet Commonly known as: LIPITOR Take 40 mg by mouth at bedtime.   cycloSPORINE 0.05 % ophthalmic emulsion Commonly known as: RESTASIS Place 1 drop into both eyes 2 (two) times daily.   DULoxetine 30 MG capsule Commonly known as: CYMBALTA Take 30 mg by mouth daily.   guaiFENesin 100 MG/5ML liquid Commonly known as: ROBITUSSIN Take 200 mg by mouth every 6 (six) hours as needed for cough.   lamoTRIgine 50 MG 24 hour tablet Commonly known as: LAMICTAL XR Take 50 mg by mouth daily.   lidocaine 5 % Commonly known as: LIDODERM Place 1 patch onto the skin daily. Remove & Discard patch within 12 hours or as directed by MD   metoprolol succinate 25 MG 24 hr tablet Commonly known as: TOPROL-XL Take 25 mg by mouth daily.   MULTIVITAMIN ADULT PO Take 1 tablet by mouth daily.  nitroGLYCERIN 0.4 MG SL tablet Commonly known as: Nitrostat Place 1 tablet (0.4 mg total) under the tongue once as needed for up to 1 dose for chest pain.   ondansetron 4 MG tablet Commonly known as: ZOFRAN Take 4 mg by mouth every 6 (six) hours as needed for nausea or  vomiting.   polyethylene glycol 17 g packet Commonly known as: MIRALAX / GLYCOLAX Take 17 g by mouth daily.   polyvinyl alcohol 1.4 % ophthalmic solution Commonly known as: LIQUIFILM TEARS Place 1 drop into both eyes 2 (two) times daily.   polyvinyl alcohol 1.4 % ophthalmic solution Commonly known as: LIQUIFILM TEARS Place 1 drop into both eyes every 12 (twelve) hours as needed for dry eyes.   ranolazine 1000 MG SR tablet Commonly known as: RANEXA Take 1,000 mg by mouth 2 (two) times daily.   tolterodine 2 MG 24 hr capsule Commonly known as: DETROL LA Take 2 mg by mouth daily. Notes to patient: Not given in hospital        Contact information for after-discharge care     [facility] .   Service: Skilled Nursing Contact information: [place] [phone]                    Allergies  Allergen Reactions   Brilinta [Ticagrelor] Other (See Comments)    "caused stent to fail"   Imdur [Isosorbide Nitrate] Other (See Comments)    Headache    Consultations: Neurosurgery  Procedures/Studies: DG Elbow 2 Views Left  Result Date: 04/18/2021 CLINICAL DATA:  Fall.  Left elbow pain. EXAM: LEFT ELBOW - 2 VIEW COMPARISON:  None. FINDINGS: There is no evidence of fracture, dislocation, or joint effusion. There is no evidence of arthropathy or other focal bone abnormality. Generalized osteopenia noted. Soft tissues are unremarkable. IMPRESSION: No acute findings. Osteopenia. Electronically Signed   By: Marlaine Hind M.D.   On: 04/18/2021 07:58   DG Wrist Complete Left  Result Date: 04/18/2021 CLINICAL DATA:  Fall.  Left wrist pain and deformity. EXAM: LEFT WRIST - COMPLETE 3+ VIEW COMPARISON:  None. FINDINGS: There is no evidence of fracture or dislocation. There is no evidence of arthropathy or other focal bone  abnormality. Generalized osteopenia is noted. Prominent dorsal soft tissue swelling and laceration is seen, however no radiopaque foreign body identified. Peripheral vascular calcification also noted. IMPRESSION: Prominent dorsal soft tissue and laceration. No evidence of acute fracture or radiopaque foreign body. Electronically Signed   By: Marlaine Hind M.D.   On: 04/18/2021 07:55   CT HEAD WO CONTRAST (5MM)  Result Date: 04/19/2021 CLINICAL DATA:  Trauma EXAM: CT HEAD WITHOUT CONTRAST TECHNIQUE: Contiguous axial images were obtained from the base of the skull through the vertex without intravenous contrast. COMPARISON:  04/18/2021 FINDINGS: Brain: There is 4 mm increased density in the left posterior parietal cortex which appears slightly less dense. There is no adjacent edema or mass effect. There is no demonstrable extra-axial fluid collection. There is asymmetric prominence of right temporal horn which appears stable, possibly suggesting atrophy. There is decreased density in the periventricular white matter. Cortical sulci are prominent. Vascular: There are scattered arterial calcifications. Skull: Unremarkable. Sinuses/Orbits: Unremarkable. Other: None. IMPRESSION: There is 4 mm petechial hemorrhage in the posterior left temporal cortex with no significant change in size. Density appears slightly less. There are no new foci of  bleeding within the cranium. Atrophy.  Small-vessel disease. Electronically Signed   By: Elmer Picker M.D.   On: 04/19/2021 10:46   CT HEAD WO CONTRAST (5MM)  Result Date: 04/18/2021 CLINICAL DATA:  Head trauma, minor (Age >= 65y); Neck trauma (Age >= 65y) EXAM: CT HEAD WITHOUT CONTRAST CT CERVICAL SPINE WITHOUT CONTRAST TECHNIQUE: Multidetector CT imaging of the head and cervical spine was performed following the standard protocol without intravenous contrast. Multiplanar CT image reconstructions of the cervical spine were also generated. COMPARISON:  Multiple priors,  most recent 12/29/2020. FINDINGS: CT HEAD FINDINGS Brain: Small 4 x 4 x 4 mm hyperdense intraparenchymal hemorrhage within the left temporal lobe (series 3, image 14). No appreciable surrounding edema or mass effect. No evidence of acute large vascular territory infarct, hydrocephalus, mass lesion, midline shift, or visible extra-axial fluid collection. Similar patchy white matter hypoattenuation, nonspecific but compatible with chronic microvascular ischemic disease. Similar chronic atrophy with ex vacuo ventricular dilation, particularly the right temporal lobe and temporal horn of the right lateral ventricle. Vascular: No hyperdense vessel identified. Calcific intracranial atherosclerosis. Skull: No evidence of acute fracture. Sinuses/Orbits: Visualized sinuses are clear.  Unremarkable orbits. Other: No mastoid effusions. CT CERVICAL SPINE FINDINGS Alignment: Unchanged alignment. Similar anterolisthesis of C3 on C4, C4 on C5, and T1 on T2. Mild broad dextrocurvature. Skull base and vertebrae: T1 compression fracture with 20-30% height loss, which appears unchanged from 12/29/2020 and new from 08/18/2020. Similar mild associated superior T1 endplate sclerosis. No evidence of new/interval fracture. Lucency through the superior aspect of the right C2 lateral mass has well corticated margins and appears similar to prior, most consistent with a chronic benign vascular channel. Soft tissues and spinal canal: No prevertebral fluid or swelling. No visible canal hematoma. Disc levels: Similar moderate degenerative disc disease at C5-C6 and C6-C7 where there is disc height loss, endplate sclerosis and posterior disc osteophyte complex. Calcified posterior disc bulges at C3-C4 and C4-C5, likely similar. Multilevel facet/uncovertebral hypertrophy. Upper chest: Biapical pleuroparenchymal scarring. Other: Calcific atherosclerosis at the carotid bifurcation. IMPRESSION: CT head: 1. Small (4 mm) acute intraparenchymal hemorrhage  in the left temporal lobe. No appreciable surrounding edema or mass effect. 2. Similar chronic microvascular ischemic disease and atrophy. CT cervical spine: 1. T1 compression fracture with 20-30% height loss, which appears unchanged from 12/29/2020 and new from 08/18/2020. 2. No evidence of new/interval fracture or traumatic malalignment. 3. Similar multilevel degenerative change. Findings discussed with Dr. Ellender Hose via telephone at 7:52 PM. Electronically Signed   By: Margaretha Sheffield M.D.   On: 04/18/2021 07:56   CT Cervical Spine Wo Contrast  Result Date: 04/18/2021 CLINICAL DATA:  Head trauma, minor (Age >= 65y); Neck trauma (Age >= 65y) EXAM: CT HEAD WITHOUT CONTRAST CT CERVICAL SPINE WITHOUT CONTRAST TECHNIQUE: Multidetector CT imaging of the head and cervical spine was performed following the standard protocol without intravenous contrast. Multiplanar CT image reconstructions of the cervical spine were also generated. COMPARISON:  Multiple priors, most recent 12/29/2020. FINDINGS: CT HEAD FINDINGS Brain: Small 4 x 4 x 4 mm hyperdense intraparenchymal hemorrhage within the left temporal lobe (series 3, image 14). No appreciable surrounding edema or mass effect. No evidence of acute large vascular territory infarct, hydrocephalus, mass lesion, midline shift, or visible extra-axial fluid collection. Similar patchy white matter hypoattenuation, nonspecific but compatible with chronic microvascular ischemic disease. Similar chronic atrophy with ex vacuo ventricular dilation, particularly the right temporal lobe and temporal horn of the right lateral ventricle. Vascular: No hyperdense vessel identified. Calcific  intracranial atherosclerosis. Skull: No evidence of acute fracture. Sinuses/Orbits: Visualized sinuses are clear.  Unremarkable orbits. Other: No mastoid effusions. CT CERVICAL SPINE FINDINGS Alignment: Unchanged alignment. Similar anterolisthesis of C3 on C4, C4 on C5, and T1 on T2. Mild broad  dextrocurvature. Skull base and vertebrae: T1 compression fracture with 20-30% height loss, which appears unchanged from 12/29/2020 and new from 08/18/2020. Similar mild associated superior T1 endplate sclerosis. No evidence of new/interval fracture. Lucency through the superior aspect of the right C2 lateral mass has well corticated margins and appears similar to prior, most consistent with a chronic benign vascular channel. Soft tissues and spinal canal: No prevertebral fluid or swelling. No visible canal hematoma. Disc levels: Similar moderate degenerative disc disease at C5-C6 and C6-C7 where there is disc height loss, endplate sclerosis and posterior disc osteophyte complex. Calcified posterior disc bulges at C3-C4 and C4-C5, likely similar. Multilevel facet/uncovertebral hypertrophy. Upper chest: Biapical pleuroparenchymal scarring. Other: Calcific atherosclerosis at the carotid bifurcation. IMPRESSION: CT head: 1. Small (4 mm) acute intraparenchymal hemorrhage in the left temporal lobe. No appreciable surrounding edema or mass effect. 2. Similar chronic microvascular ischemic disease and atrophy. CT cervical spine: 1. T1 compression fracture with 20-30% height loss, which appears unchanged from 12/29/2020 and new from 08/18/2020. 2. No evidence of new/interval fracture or traumatic malalignment. 3. Similar multilevel degenerative change. Findings discussed with Dr. Ellender Hose via telephone at 7:52 PM. Electronically Signed   By: Margaretha Sheffield M.D.   On: 04/18/2021 07:56   DG Hand Complete Left  Result Date: 04/18/2021 CLINICAL DATA:  Fall.  Left hand pain and deformity. EXAM: LEFT HAND - COMPLETE 3+ VIEW COMPARISON:  None. FINDINGS: Technically suboptimal exam due to persistent flexion deformity of the left hand. No acute fracture or dislocation identified. Generalized osteopenia is noted. Mild-to-moderate osteoarthritis is seen involving the D IP joint of the index finger. Peripheral vascular  calcification also noted. IMPRESSION: Technically suboptimal exam due to persistent flexion deformity of the hand. No acute findings. Electronically Signed   By: Marlaine Hind M.D.   On: 04/18/2021 07:57     Subjective: No acute issues or events overnight   Discharge Exam: Vitals:   04/24/21 1934 04/25/21 0749  BP: (!) 156/50 (!) 139/46  Pulse: (!) 58 (!) 59  Resp: 18 17  Temp: 98.2 F (36.8 C) 98.2 F (36.8 C)  SpO2: 100% 100%   Vitals:   04/24/21 1145 04/24/21 1539 04/24/21 1934 04/25/21 0749  BP: (!) 136/53 (!) 127/45 (!) 156/50 (!) 139/46  Pulse: 86 (!) 56 (!) 58 (!) 59  Resp: 16 15 18 17   Temp: 98.1 F (36.7 C) 98 F (36.7 C) 98.2 F (36.8 C) 98.2 F (36.8 C)  TempSrc:      SpO2: 99% 99% 100% 100%  Weight:      Height:        General: Pt is alert, awake, not in acute distress Cardiovascular: RRR, S1/S2 +, no rubs, no gallops Respiratory: CTA bilaterally, no wheezing, no rhonchi Abdominal: Soft, NT, ND, bowel sounds + Extremities: no edema, no cyanosis    The results of significant diagnostics from this hospitalization (including imaging, microbiology, ancillary and laboratory) are listed below for reference.     Microbiology: Recent Results (from the past 240 hour(s))  Resp Panel by RT-PCR (Flu A&B, Covid) Nasopharyngeal Swab     Status: None   Collection Time: 04/18/21 10:41 AM   Specimen: Nasopharyngeal Swab; Nasopharyngeal(NP) swabs in vial transport medium  Result Value Ref Range  Status   SARS Coronavirus 2 by RT PCR NEGATIVE NEGATIVE Final    Comment: (NOTE) SARS-CoV-2 target nucleic acids are NOT DETECTED.  The SARS-CoV-2 RNA is generally detectable in upper respiratory specimens during the acute phase of infection. The lowest concentration of SARS-CoV-2 viral copies this assay can detect is 138 copies/mL. A negative result does not preclude SARS-Cov-2 infection and should not be used as the sole basis for treatment or other patient management  decisions. A negative result may occur with  improper specimen collection/handling, submission of specimen other than nasopharyngeal swab, presence of viral mutation(s) within the areas targeted by this assay, and inadequate number of viral copies(<138 copies/mL). A negative result must be combined with clinical observations, patient history, and epidemiological information. The expected result is Negative.  Fact Sheet for Patients:  EntrepreneurPulse.com.au  Fact Sheet for Healthcare Providers:  IncredibleEmployment.be  This test is no t yet approved or cleared by the [place] FDA and  has been authorized for detection and/or diagnosis of SARS-CoV-2 by FDA under an Emergency Use Authorization (EUA). This EUA will remain  in effect (meaning this test can be used) for the duration of the COVID-19 declaration under Section 564(b)(1) of the Act, 21 U.S.C.section 360bbb-3(b)(1), unless the authorization is terminated  or revoked sooner.       Influenza A by PCR NEGATIVE NEGATIVE Final   Influenza B by PCR NEGATIVE NEGATIVE Final    Comment: (NOTE) The Xpert Xpress SARS-CoV-2/FLU/RSV plus assay is intended as an aid in the diagnosis of influenza from Nasopharyngeal swab specimens and should not be used as a sole basis for treatment. Nasal washings and aspirates are unacceptable for Xpert Xpress SARS-CoV-2/FLU/RSV testing.  Fact Sheet for Patients: EntrepreneurPulse.com.au  Fact Sheet for Healthcare Providers: IncredibleEmployment.be  This test is not yet approved or cleared by the [place] FDA and has been authorized for detection and/or diagnosis of SARS-CoV-2 by FDA under an Emergency Use Authorization (EUA). This EUA will remain in effect (meaning this test can be used) for the duration of the COVID-19 declaration under Section 564(b)(1) of the Act, 21 U.S.C. section 360bbb-3(b)(1), unless the  authorization is terminated or revoked.  Performed at [facility], [address] [place], [place] [address]      Labs: BNP (last 3 results) No results for input(s): BNP in the last 8760 hours. Basic Metabolic Panel: Recent Labs  Lab 04/19/21 0207 04/20/21 0228  NA 135 133*  K 3.7 4.1  CL 107 104  CO2 24 22  GLUCOSE 97 100*  BUN 35* 30*  CREATININE 1.19* 1.31*  CALCIUM 9.0 9.3    Liver Function Tests: No results for input(s): AST, ALT, ALKPHOS, BILITOT, PROT, ALBUMIN in the last 168 hours.  No results for input(s): LIPASE, AMYLASE in the last 168 hours. No results for input(s): AMMONIA in the last 168 hours. CBC: Recent Labs  Lab 04/19/21 0207 04/20/21 0228  WBC 4.6 7.0  HGB 9.4* 9.9*  HCT 28.9* 29.0*  MCV 101.4* 96.7  PLT 206 218    Cardiac Enzymes: No results for input(s): CKTOTAL, CKMB, CKMBINDEX, TROPONINI in the last 168 hours. BNP: Invalid input(s): POCBNP CBG: No results for input(s): GLUCAP in the last 168 hours. D-Dimer No results for input(s): DDIMER in the last 72 hours. Hgb A1c No results for input(s): HGBA1C in the last 72 hours. Lipid Profile No results for input(s): CHOL, HDL, LDLCALC, TRIG, CHOLHDL, LDLDIRECT in the last 72 hours. Thyroid function studies No results for input(s):  TSH, T4TOTAL, T3FREE, THYROIDAB in the last 72 hours.  Invalid input(s): FREET3 Anemia work up No results for input(s): VITAMINB12, FOLATE, FERRITIN, TIBC, IRON, RETICCTPCT in the last 72 hours. Urinalysis    Component Value Date/Time   COLORURINE YELLOW (A) 04/18/2021 1041   APPEARANCEUR CLEAR (A) 04/18/2021 1041   APPEARANCEUR Hazy 03/16/2014 1156   LABSPEC 1.020 04/18/2021 1041   LABSPEC 1.018 03/16/2014 1156   PHURINE 5.0 04/18/2021 1041   GLUCOSEU NEGATIVE 04/18/2021 1041   GLUCOSEU Negative 03/16/2014 1156   HGBUR NEGATIVE 04/18/2021 [place] 04/18/2021 1041   BILIRUBINUR Negative 03/16/2014 [place] 04/18/2021 1041   PROTEINUR NEGATIVE 04/18/2021 1041   NITRITE NEGATIVE 04/18/2021 1041   LEUKOCYTESUR SMALL (A) 04/18/2021 1041   LEUKOCYTESUR Trace 03/16/2014 1156   Sepsis Labs Invalid input(s): PROCALCITONIN,  WBC,  LACTICIDVEN Microbiology Recent Results (from the past 240 hour(s))  Resp Panel by RT-PCR (Flu A&B, Covid) Nasopharyngeal Swab     Status: None   Collection Time: 04/18/21 10:41 AM   Specimen: Nasopharyngeal Swab; Nasopharyngeal(NP) swabs in vial transport medium  Result Value Ref Range Status   SARS Coronavirus 2 by RT PCR NEGATIVE NEGATIVE Final    Comment: (NOTE) SARS-CoV-2 target nucleic acids are NOT DETECTED.  The SARS-CoV-2 RNA is generally detectable in upper respiratory specimens during the acute phase of infection. The lowest concentration of SARS-CoV-2 viral copies this assay can detect is 138 copies/mL. A negative result does not preclude SARS-Cov-2 infection and should not be used as the sole basis for treatment or other patient management decisions. A negative result may occur with  improper specimen collection/handling, submission of specimen other than nasopharyngeal swab, presence of viral mutation(s) within the areas targeted by this assay, and inadequate number of viral copies(<138 copies/mL). A negative result must be combined with clinical observations, patient history, and epidemiological information. The expected result is Negative.  Fact Sheet for Patients:  EntrepreneurPulse.com.au  Fact Sheet for Healthcare Providers:  IncredibleEmployment.be  This test is no t yet approved or cleared by the [place] FDA and  has been authorized for detection and/or diagnosis of SARS-CoV-2 by FDA under an Emergency Use Authorization (EUA). This EUA will remain  in effect (meaning this test can be used) for the duration of the COVID-19 declaration under Section 564(b)(1) of the Act, 21 U.S.C.section  360bbb-3(b)(1), unless the authorization is terminated  or revoked sooner.       Influenza A by PCR NEGATIVE NEGATIVE Final   Influenza B by PCR NEGATIVE NEGATIVE Final    Comment: (NOTE) The Xpert Xpress SARS-CoV-2/FLU/RSV plus assay is intended as an aid in the diagnosis of influenza from Nasopharyngeal swab specimens and should not be used as a sole basis for treatment. Nasal washings and aspirates are unacceptable for Xpert Xpress SARS-CoV-2/FLU/RSV testing.  Fact Sheet for Patients: EntrepreneurPulse.com.au  Fact Sheet for Healthcare Providers: IncredibleEmployment.be  This test is not yet approved or cleared by the [place] FDA and has been authorized for detection and/or diagnosis of SARS-CoV-2 by FDA under an Emergency Use Authorization (EUA). This EUA will remain in effect (meaning this test can be used) for the duration of the COVID-19 declaration under Section 564(b)(1) of the Act, 21 U.S.C. section 360bbb-3(b)(1), unless the authorization is terminated or revoked.  Performed at [facility], [address], [place], [place] [address]      Time coordinating discharge: Over 30 minutes  SIGNED:   Little Ishikawa, DO [facility] 04/25/2021, 11:10 AM Pager   If 7PM-7AM, please contact night-coverage [email or web address]

## 2021-04-25 NOTE — Progress Notes (Signed)
EMS here to transport pt. 

## 2021-04-25 NOTE — TOC Progression Note (Signed)
Transition of Care [facility]) - Progression Note    Patient Details  Name: Monique Burns MRN: [id number] Date of Birth: 05-26-1930  Transition of Care [facility]) CM/SW [place], RN Phone Number: 04/25/2021, 1:34 PM  Clinical Narrative:   patient  going to [facility] and there are 3 ahead of her    Expected Discharge Plan: [place] Barriers to Discharge: Continued Medical Work up  Expected Discharge Plan and Services Expected Discharge Plan: [place]   Discharge Planning Services: CM Consult Post Acute Care Choice: [place] Living arrangements for the past 2 months: [place] Expected Discharge Date: 04/25/21               DME Arranged: N/A DME Agency: NA       HH Arranged: NA HH Agency: NA         Social Determinants of Health (SDOH) Interventions    Readmission Risk Interventions No flowsheet data found.

## 2021-04-25 NOTE — Progress Notes (Signed)
Daughter notified EMS here

## 2021-04-26 DIAGNOSIS — F0390 Unspecified dementia without behavioral disturbance: Secondary | ICD-10-CM | POA: Insufficient documentation

## 2021-05-11 ENCOUNTER — Emergency Department: Payer: Medicare Other

## 2021-05-11 ENCOUNTER — Other Ambulatory Visit: Payer: Self-pay

## 2021-05-11 ENCOUNTER — Emergency Department
Admission: EM | Admit: 2021-05-11 | Discharge: 2021-05-12 | Disposition: A | Discharge status: Home / Self Care | Payer: Medicare Other | Attending: Emergency Medicine | Admitting: Emergency Medicine

## 2021-05-11 DIAGNOSIS — R109 Unspecified abdominal pain: Secondary | ICD-10-CM

## 2021-05-11 DIAGNOSIS — R4182 Altered mental status, unspecified: Secondary | ICD-10-CM | POA: Diagnosis present

## 2021-05-11 DIAGNOSIS — I2510 Atherosclerotic heart disease of native coronary artery without angina pectoris: Secondary | ICD-10-CM | POA: Insufficient documentation

## 2021-05-11 DIAGNOSIS — I130 Hypertensive heart and chronic kidney disease with heart failure and stage 1 through stage 4 chronic kidney disease, or unspecified chronic kidney disease: Secondary | ICD-10-CM | POA: Diagnosis not present

## 2021-05-11 DIAGNOSIS — I509 Heart failure, unspecified: Secondary | ICD-10-CM | POA: Diagnosis not present

## 2021-05-11 DIAGNOSIS — F0390 Unspecified dementia without behavioral disturbance: Secondary | ICD-10-CM | POA: Diagnosis not present

## 2021-05-11 DIAGNOSIS — N189 Chronic kidney disease, unspecified: Secondary | ICD-10-CM | POA: Insufficient documentation

## 2021-05-11 DIAGNOSIS — N390 Urinary tract infection, site not specified: Secondary | ICD-10-CM

## 2021-05-11 DIAGNOSIS — G8929 Other chronic pain: Secondary | ICD-10-CM

## 2021-05-11 LAB — COMPREHENSIVE METABOLIC PANEL
ALT: 13 U/L (ref 0–44)
AST: 24 U/L (ref 15–41)
Albumin: 3.7 g/dL (ref 3.5–5.0)
Alkaline Phosphatase: 101 U/L (ref 38–126)
Anion gap: 8 (ref 5–15)
BUN: 31 mg/dL — ABNORMAL HIGH (ref 8–23)
CO2: 22 mmol/L (ref 22–32)
Calcium: 9.3 mg/dL (ref 8.9–10.3)
Chloride: 103 mmol/L (ref 98–111)
Creatinine, Ser: 1.64 mg/dL — ABNORMAL HIGH (ref 0.44–1.00)
GFR, Estimated: 30 mL/min — ABNORMAL LOW (ref >60)
Glucose, Bld: 109 mg/dL — ABNORMAL HIGH (ref 70–99)
Potassium: 4.8 mmol/L (ref 3.5–5.1)
Sodium: 133 mmol/L — ABNORMAL LOW (ref 135–145)
Total Bilirubin: 0.9 mg/dL (ref 0.3–1.2)
Total Protein: 7.1 g/dL (ref 6.5–8.1)

## 2021-05-11 LAB — CBC
HCT: 33.1 % — ABNORMAL LOW (ref 36.0–46.0)
Hemoglobin: 10.5 g/dL — ABNORMAL LOW (ref 12.0–15.0)
MCH: 32.4 pg (ref 26.0–34.0)
MCHC: 31.7 g/dL (ref 30.0–36.0)
MCV: 102.2 fL — ABNORMAL HIGH (ref 80.0–100.0)
Platelets: 295 10*3/uL (ref 150–400)
RBC: 3.24 MIL/uL — ABNORMAL LOW (ref 3.87–5.11)
RDW: 13.4 % (ref 11.5–15.5)
WBC: 5.6 10*3/uL (ref 4.0–10.5)
nRBC: 0 % (ref 0.0–0.2)

## 2021-05-11 MED ORDER — SODIUM CHLORIDE 0.9 % IV BOLUS
500.0000 mL | Freq: Once | INTRAVENOUS | Status: AC
  Administered 2021-05-11: 500 mL via INTRAVENOUS

## 2021-05-11 NOTE — ED Notes (Signed)
ED Provider at bedside. 

## 2021-05-11 NOTE — ED Notes (Signed)
Pt's daughter reports Pt was sent to ED because "she checked several boxes at the facility that she might be having a stroke."  Sts Pt recently had COVID.

## 2021-05-11 NOTE — ED Triage Notes (Signed)
Pt comes via EMs from [facility] with c/o generalized weakness and AMS. LKW was 2100 last night. 124/50 56 98 % RA CBG-117 NSR 2o g right hand

## 2021-05-11 NOTE — ED Provider Notes (Signed)
11:20 PM  Assumed care at shift change.  Patient here with altered mental status from nursing home with concern for possible stroke.  History of dementia.  Work-up shows a mild AKI for which she is receiving IV fluids.  MRI brain shows no acute abnormality.  Urinalysis pending.  12:50 AM  Pt's urine appears mildly infected.  On review of patient's records, it appears her last urine culture in August 2022 grew Proteus mirabilis sensitive to cephalosporins.  Will give Rocephin here.  Spoke with daughter by phone - has h/o seizures versus TIA and followed by neurology.  Reviewed patient's records, patient was just admitted 04/18/2021 to 04/15/2021 after she states today staff found her "leaned over and favoring her left side" and "balled up" and unresponsive.  They thought she may have had a stroke.  She states per their protocol they sent her here but daughter states this is not abnormal for her.  Just had COVID last week.  No hypoxia or increased WOB here.  Denies CP or SOB.  Patient c/o abdominal pain.  She points to her lower abdomen as the source of her pain but exam benign and non surgical.  No fever, vomiting here.  Has had "series of ovarian cysts" per daughter and has a "rapidly growing mass" in abdomen.  Supposed to see GYN as outpatient on the 12th but had fall and was hospitalized and did not make that appt.  Seen by Dr. Ouida Sills - has already seen once and had TVUS.  Daughter states the abdominal pain is not new and she does not want further emergent work up at this time.    Daughter says she was hallucinating while she was in the [place] but became more lucid.  This is also not abnormal for her.  Admission considered but patient at her baseline per daughter with no new acute complaints.  Discussed mild UTI.  Daughter would prefer dc home on oral abx which I feel is reasonable.  Recommended close follow up with neuro and GYN.  She states the last time she was in the hospital she became more  confused and agitated and agrees that she would be better off at [facility].  Patient is a DNR/DNI and is being followed by palliative care.  At this time, I do not feel there is any life-threatening condition present. I reviewed all nursing notes, vitals, pertinent previous records.  All labs, EKGs, imaging ordered have been independently reviewed and interpreted by myself.  I have reviewed nursing notes and appropriate previous records.  I feel the patient is safe to be discharged home without further emergent workup and can continue workup as an outpatient as needed. Discussed all findings and treatment plan with daughter as well as usual and customary return precautions.  They verbalize understanding and are comfortable with this plan.  Outpatient follow-up has been provided as needed. All questions have been answered.    Paul Torpey, Delice Bison, DO 05/12/21 [phone]

## 2021-05-11 NOTE — ED Notes (Signed)
Pt and daughter are aware we need a urine sample.  They will call out when Pt can provide a sample.

## 2021-05-11 NOTE — ED Provider Notes (Signed)
[facility] Provider Note    Event Date/Time   First MD Initiated Contact with Patient 05/11/21 1821     (approximate)   History   Altered Mental Status   HPI  Monique Burns is a 88 y.o. female  who, according to discharge summary dated 12.19.22 was in the hospital after a mechanical fall with intraparenchymal hemorrhage without focal deficits and history of heart failure, htn, CAD, CKD, dementia, presents to the emergency department today from living facility because of concern for possible stroke.  Patient unfortunately is unable to give any significant history so history is obtained from daughter at bedside.  She states that apparently at the living facility today this morning when they went to check on her they found her with decreased responsiveness and the patient was not willing to use her left arm.  Daughter does state that she has hurt her left collarbone in the past so does have some baseline issues with that arm.  At the time my exam daughter states that the patient seems to be more or less at her baseline.  They were concerned about possible CVA at the living facility. Daughter has not noticed nor been notified about any recent fevers.       Physical Exam   Triage Vital Signs: ED Triage Vitals  Enc Vitals Group     BP 05/11/21 1216 (!) 127/58     Pulse Rate 05/11/21 1216 (!) 55     Resp 05/11/21 1216 16     Temp 05/11/21 1216 97.8 F (36.6 C)     Temp Source 05/11/21 1216 Oral     SpO2 05/11/21 1216 99 %     Weight --      Height --      Head Circumference --      Peak Flow --      Pain Score 05/11/21 1206 3     Pain Loc --      Pain Edu? --      Excl. in [place]? --     Most recent vital signs: Vitals:   05/11/21 1742 05/11/21 1743  BP:    Pulse: (!) 58 (!) 52  Resp:  11  Temp:    SpO2: 100% 100%    General: Awake, no distress. Not completely oriented CV:  Good peripheral perfusion. Bradycardic. Resp:  Normal effort.  Abd:  No  distention.    ED Results / Procedures / Treatments   Labs (all labs ordered are listed, but only abnormal results are displayed) Labs Reviewed  COMPREHENSIVE METABOLIC PANEL - Abnormal; Notable for the following components:      Result Value   Sodium 133 (*)    Glucose, Bld 109 (*)    BUN 31 (*)    Creatinine, Ser 1.64 (*)    GFR, Estimated 30 (*)    All other components within normal limits  CBC - Abnormal; Notable for the following components:   RBC 3.24 (*)    Hemoglobin 10.5 (*)    HCT 33.1 (*)    MCV 102.2 (*)    All other components within normal limits  URINALYSIS, COMPLETE (UACMP) WITH MICROSCOPIC  CBG MONITORING, ED     EKG  I, Nance Pear, attending physician, personally viewed and interpreted this EKG  EKG Time: 1222 Rate: 54 Rhythm: sinus bradycardia Axis: left axis deviation Intervals: qtc 438 QRS: LAFB ST changes: no st elevation Impression: abnormal ekg  RADIOLOGY CT head My interpretation: No acute  bleed. No large mass.  Radiologist interpretation:  IMPRESSION:  No evidence of acute intracranial abnormality.     Redemonstrated focus of chronic encephalomalacia/gliosis within the  anterior right temporal lobe. This may be posttraumatic in etiology  or may reflect a chronic infarct.     Moderate chronic small vessel ischemic changes within the cerebral  white matter.     Mild generalized cerebral and cerebellar atrophy.     Minimal mucosal thickening within the bilateral ethmoid and  maxillary sinuses at the imaged levels.   MRI brain: Radiology interpretation: IMPRESSION:  1. No acute intracranial abnormality.  2. Chronic right temporal encephalomalacia, which could be related  to prior ischemia or possibly trauma, stable.  3. Innumerable scattered chronic micro hemorrhages involving the  bilateral cerebral hemispheres, predominantly peripheral in  location, suggesting cerebral amyloid angiopathy.  4. Underlying moderate  chronic microvascular ischemic disease.       PROCEDURES:  Critical Care performed: No  Procedures   MEDICATIONS ORDERED IN ED: Medications - No data to display   IMPRESSION / MDM / [place] / ED COURSE  I reviewed the triage vital signs and the nursing notes.                              Differential diagnosis includes, but is not limited to, CVA, weakness, anemia, arrhythmia, electrolyte abnormality.  Patient presented to the emergency department today from living facility because of concerns for possible stroke given clinical symptoms of decreased responsiveness and decreased use of the left arm.  At the time my exam patient is awake and alert.  She is not completely oriented.  Daughter is at bedside.  Did discuss CT head with daughter.  No acute findings there we did discuss possibility of CVA missed by CT scan so MRI was obtained.  This fortunately did not show any acute stroke.  Additionally blood work was checked.  While patient was slightly anemic this was not a significant change from baseline.  Creatinine was a little bit elevated so patient was given IV fluids.  Awaiting urine at time of signout.   FINAL CLINICAL IMPRESSION(S) / ED DIAGNOSES   Altered mental status.     Note:  This document was prepared using Dragon voice recognition software and may include unintentional dictation errors.    Nance Pear, MD 05/12/21 1655

## 2021-05-12 ENCOUNTER — Emergency Department: Payer: Medicare Other

## 2021-05-12 LAB — URINALYSIS, COMPLETE (UACMP) WITH MICROSCOPIC
Bacteria, UA: NONE SEEN
Specific Gravity, Urine: 1.029 (ref 1.005–1.030)
Squamous Epithelial / HPF: NONE SEEN (ref 0–5)

## 2021-05-12 MED ORDER — SODIUM CHLORIDE 0.9 % IV SOLN
1.0000 g | Freq: Once | INTRAVENOUS | Status: AC
  Administered 2021-05-12: 1 g via INTRAVENOUS
  Filled 2021-05-12: qty 10

## 2021-05-12 MED ORDER — CEPHALEXIN 500 MG PO CAPS: 500.0000 mg | ORAL_CAPSULE | Freq: Two times a day (BID) | ORAL | 0 refills | Status: DC

## 2021-05-12 NOTE — Discharge Instructions (Addendum)
Lab work today was reassuring.  Patient has a mild urinary tract infection for which we have given Rocephin and are discharging with a prescription of Keflex.  CT of the head and MRI of the brain showed no acute abnormality including no acute stroke.  Please follow-up with primary care doctor as needed.  Please follow-up with OB/GYN as scheduled.

## 2021-05-13 LAB — URINE CULTURE: Culture: NO GROWTH

## 2021-05-17 DIAGNOSIS — G8929 Other chronic pain: Secondary | ICD-10-CM | POA: Insufficient documentation

## 2021-05-26 IMAGING — CT CT HEAD W/O CM
3 series · 16 of 47 positions shown, 19 images · non-contrast
Comparison: December 31, 2019

CLINICAL DATA: Pain following fall

EXAM:
CT HEAD WITHOUT CONTRAST
TECHNIQUE: Contiguous axial images were obtained from the base of the skull
through the vertex without intravenous contrast.

[Series 2: head wo · axial · 0.42mm/px · z∈[+310,+435]mm · 10 of 31 slices shown, 13 images]
[im 3/31  brain]
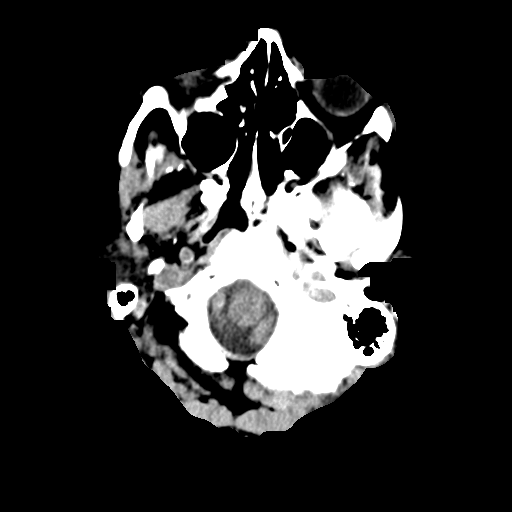
[im 3/31  bone]
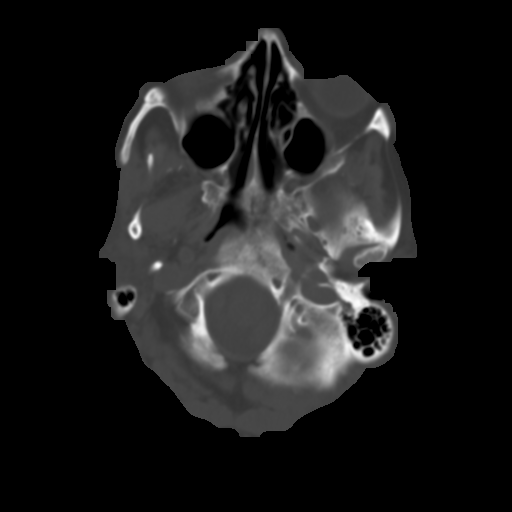
[im 6/31  brain]
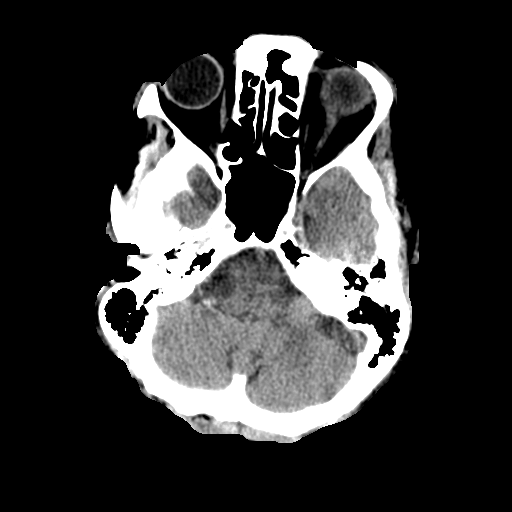
[im 9/31  brain]
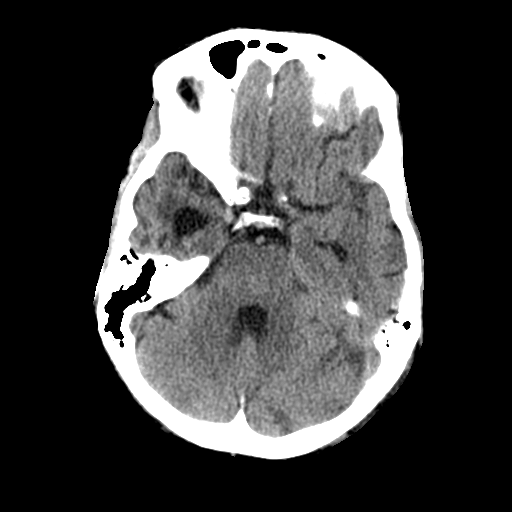
[im 11/31  brain]
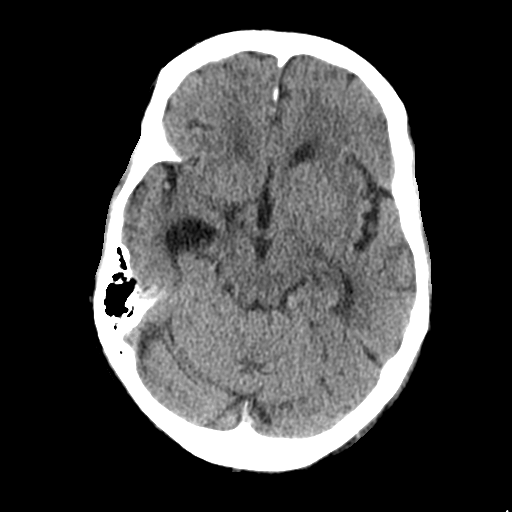
[im 14/31  brain]
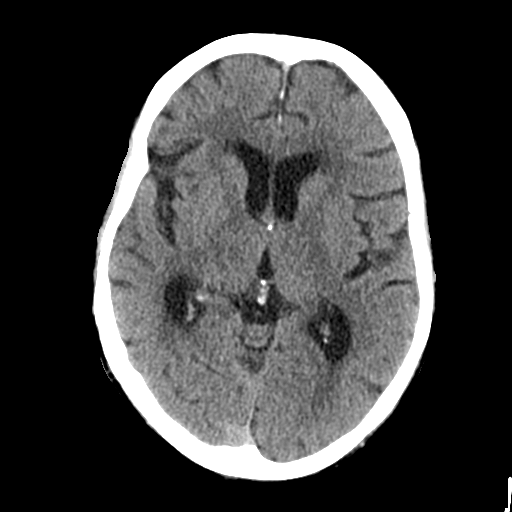
[im 14/31  bone]
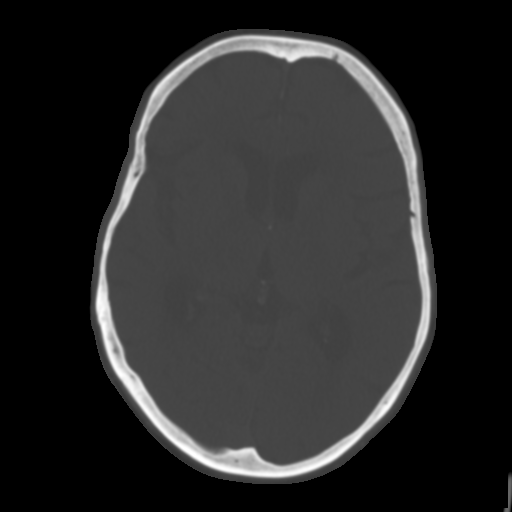
[im 17/31  brain]
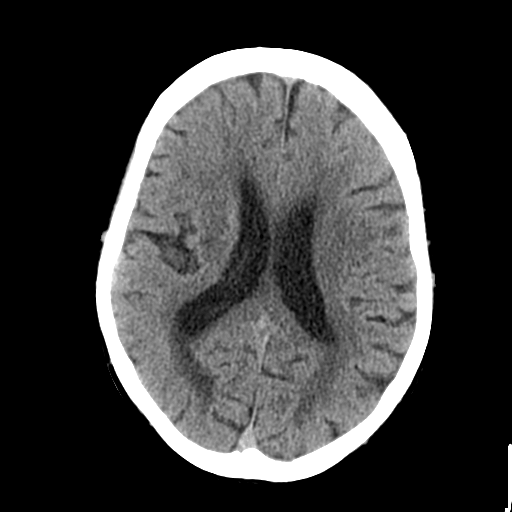
[im 20/31  brain]
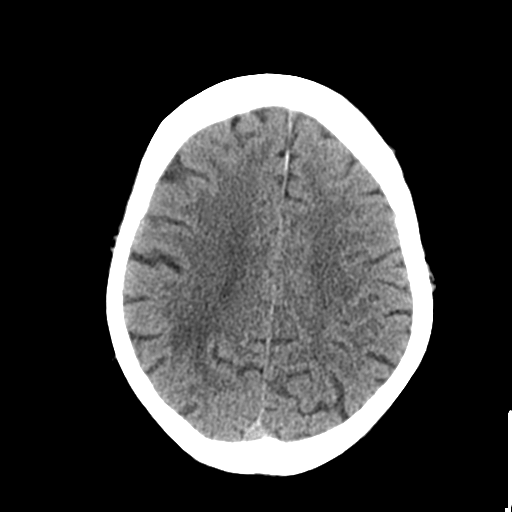
[im 23/31  brain]
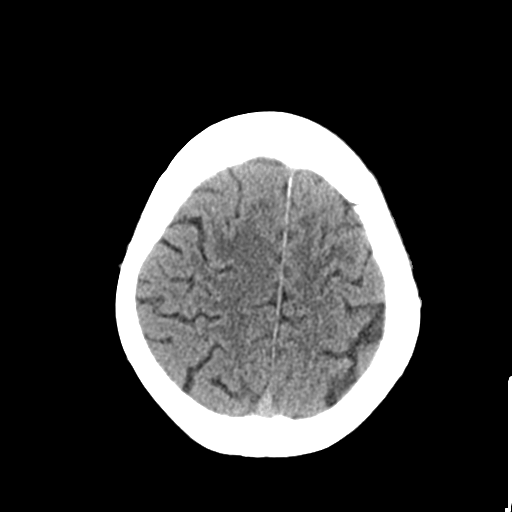
[im 25/31  brain]
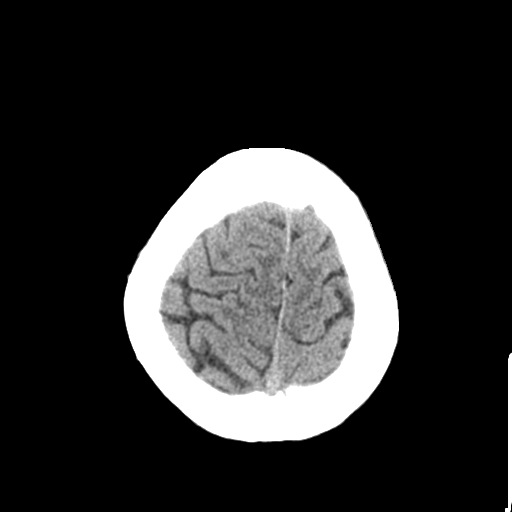
[im 25/31  bone]
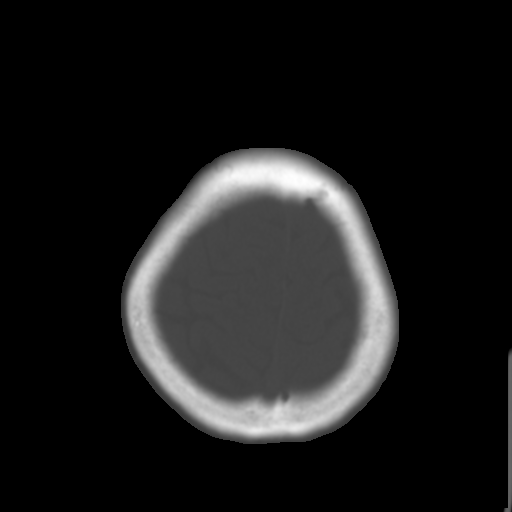
[im 28/31  brain]
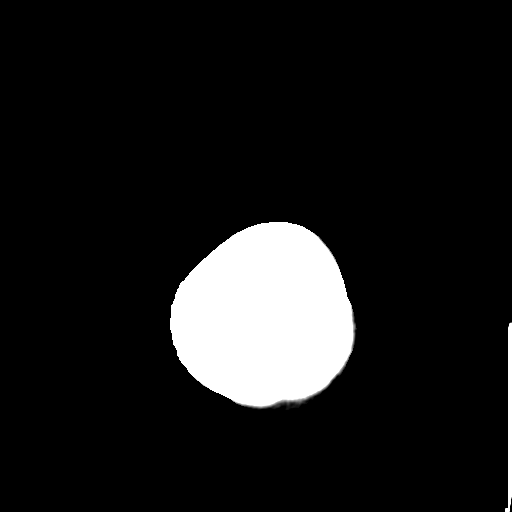

[Series 4: coronal soft tissue · coronal · 0.31mm/px · 3 of 66 slices shown]
[im 22/66  brain]
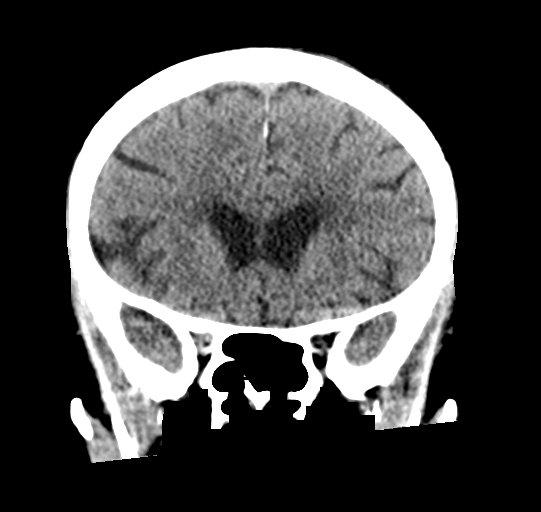
[im 29/66  brain]
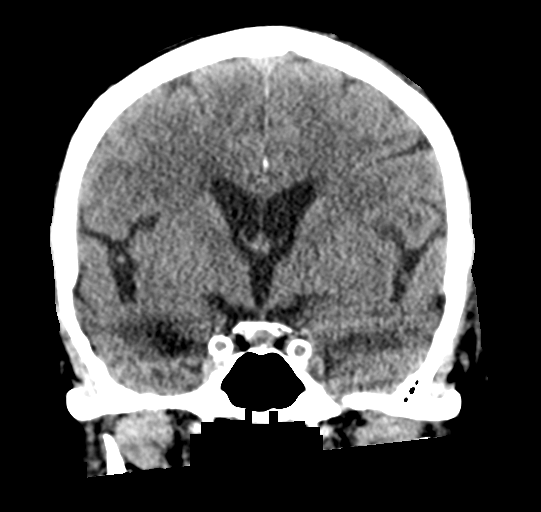
[im 37/66  brain]
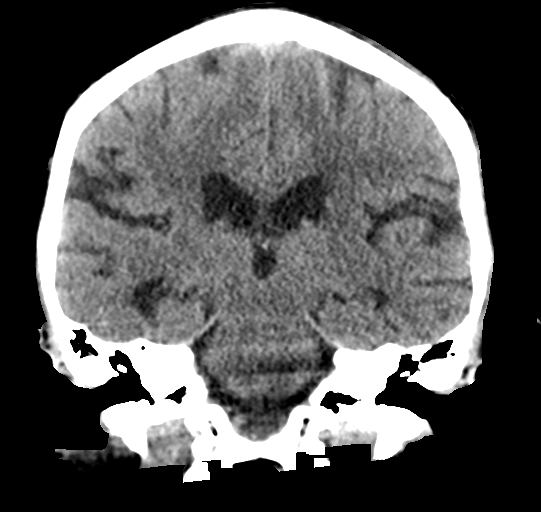

[Series 5: sagittal soft tissue · sagittal · 0.31mm/px · 3 of 56 slices shown]
[im 19/56  brain]
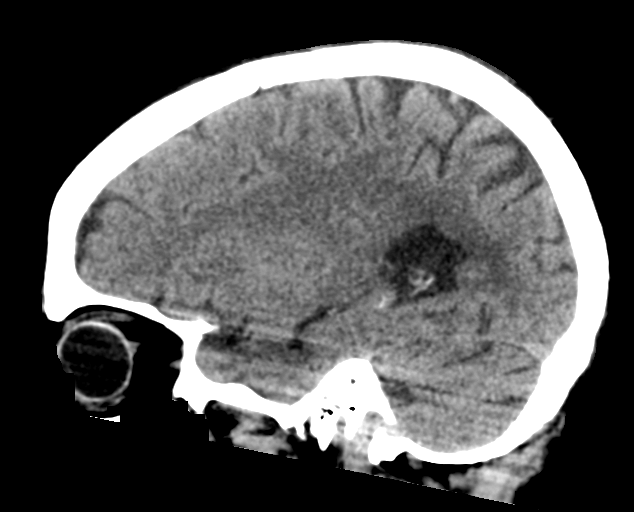
[im 28/56  brain]
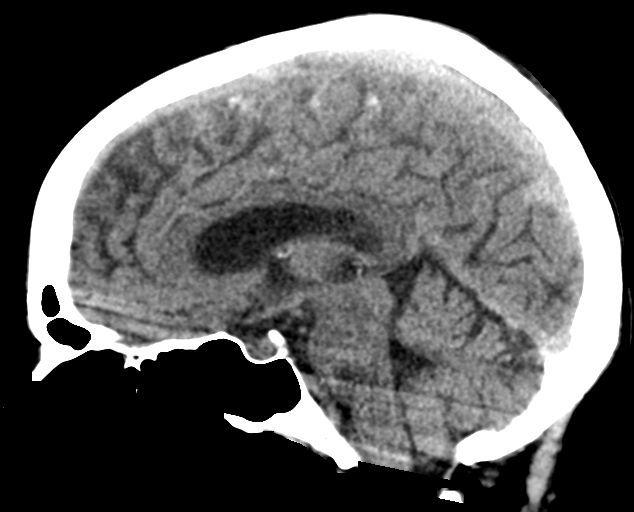
[im 37/56  brain]
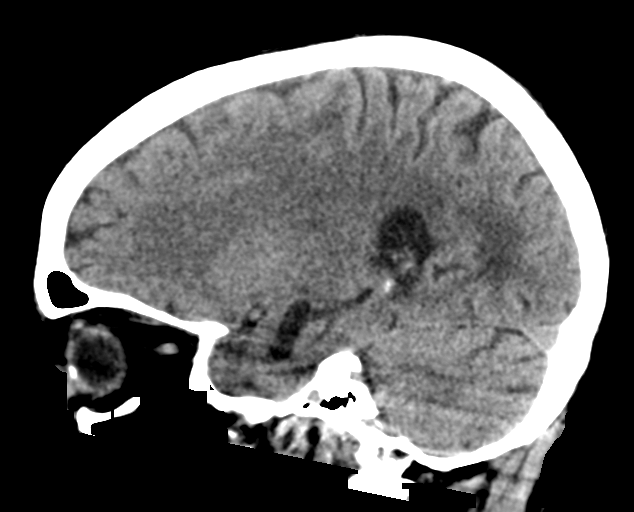

[16 of 47 positions shown; findings below may reference images not displayed]

FINDINGS: Brain: There is stable age related volume loss. There is no
intracranial mass, hemorrhage, extra-axial fluid collection, or
midline shift. There is patchy small vessel disease in the centra
semiovale bilaterally, stable. No acute infarct is evident.

Vascular: No hyperdense vessel. Foci of calcification noted in the
distal left vertebral artery and in each carotid siphon region.

Skull: The bony calvarium appears intact.

Sinuses/Orbits: There is mucosal thickening in multiple ethmoid air
cells. Orbits appear symmetric bilaterally.

Other: Visualized mastoid air cells are clear.
IMPRESSION: Age related volume loss with stable patchy periventricular small
vessel disease. No acute infarct. No mass or hemorrhage.

There are foci of arterial vascular calcification. There is mucosal
thickening in several ethmoid air cells.

## 2021-05-26 IMAGING — CR DG HIP (WITH OR WITHOUT PELVIS) 2-3V*L*
1 series · 3 of 3 positions shown · non-contrast
Comparison: None.

CLINICAL DATA: Left hip pain after fall.

EXAM:
DG HIP (WITH OR WITHOUT PELVIS) 2-3V LEFT

[Series 1: dg hip unilat w or w/o pelvis 2-3 views  · non-contrast · 0.14mm/px · 3 of 3 slices shown]
[im 1/3]
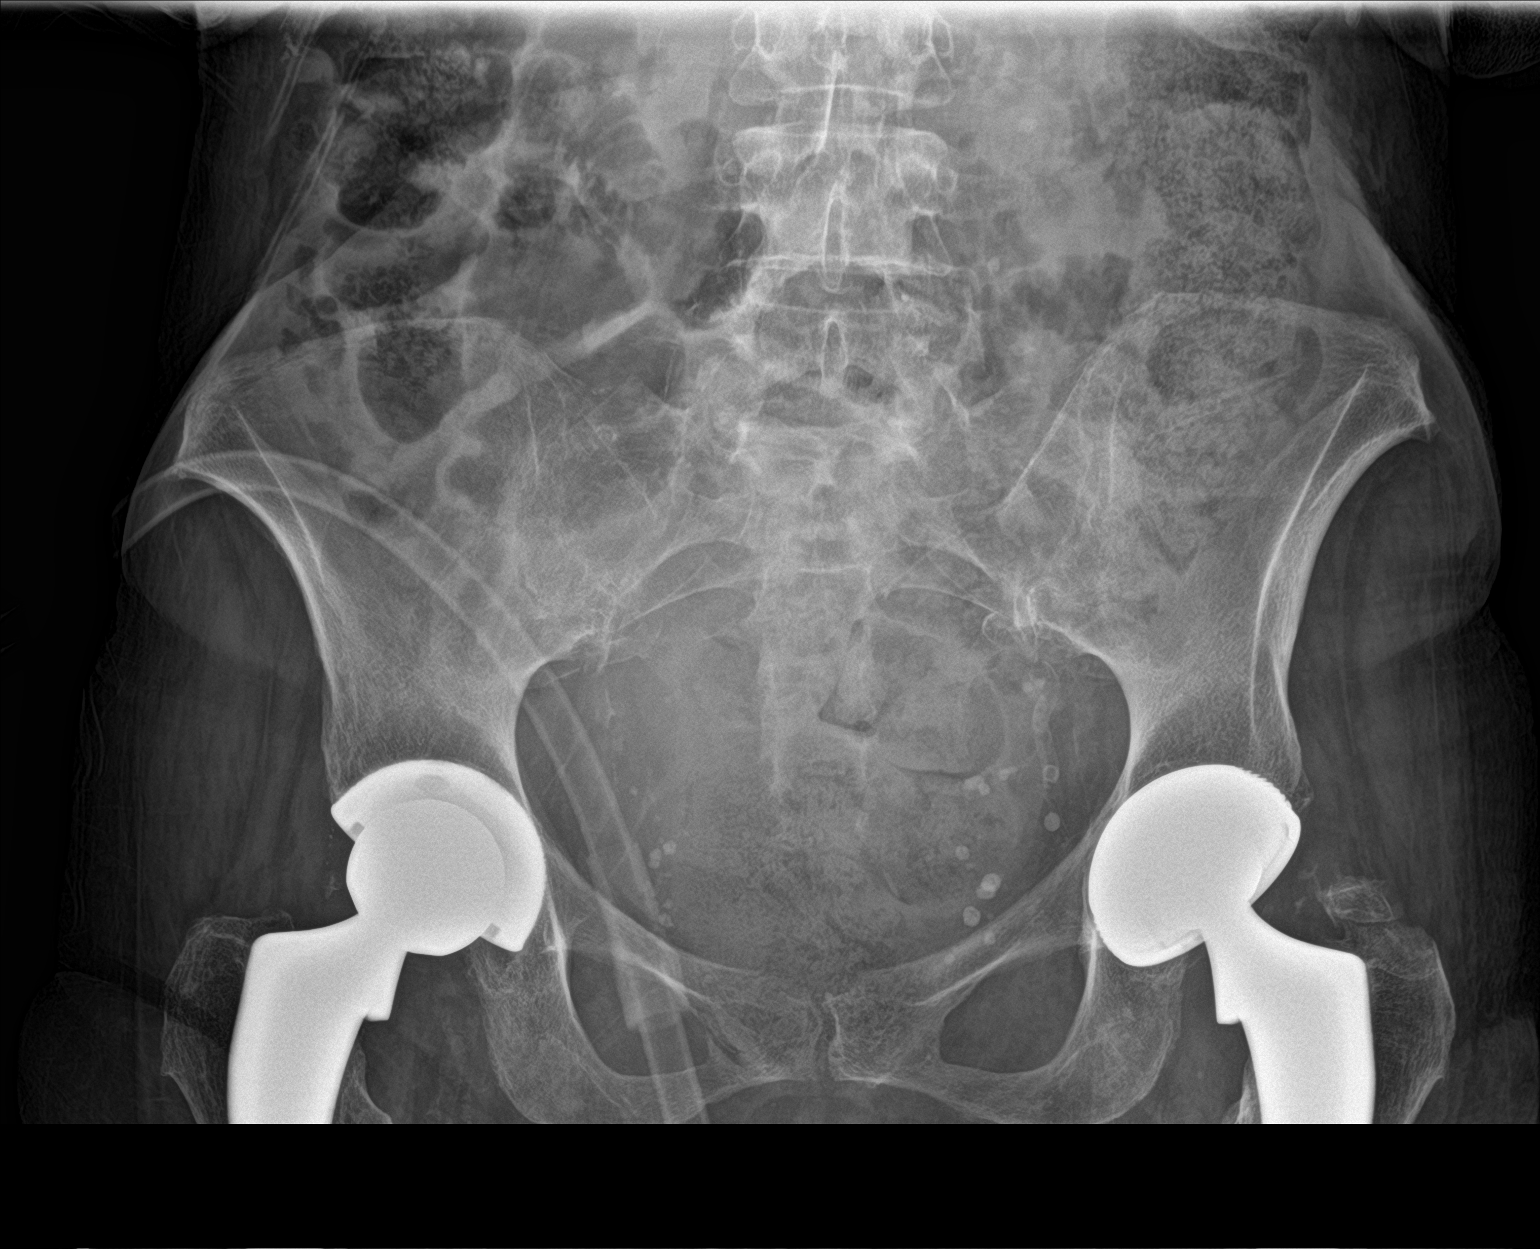
[im 2/3]
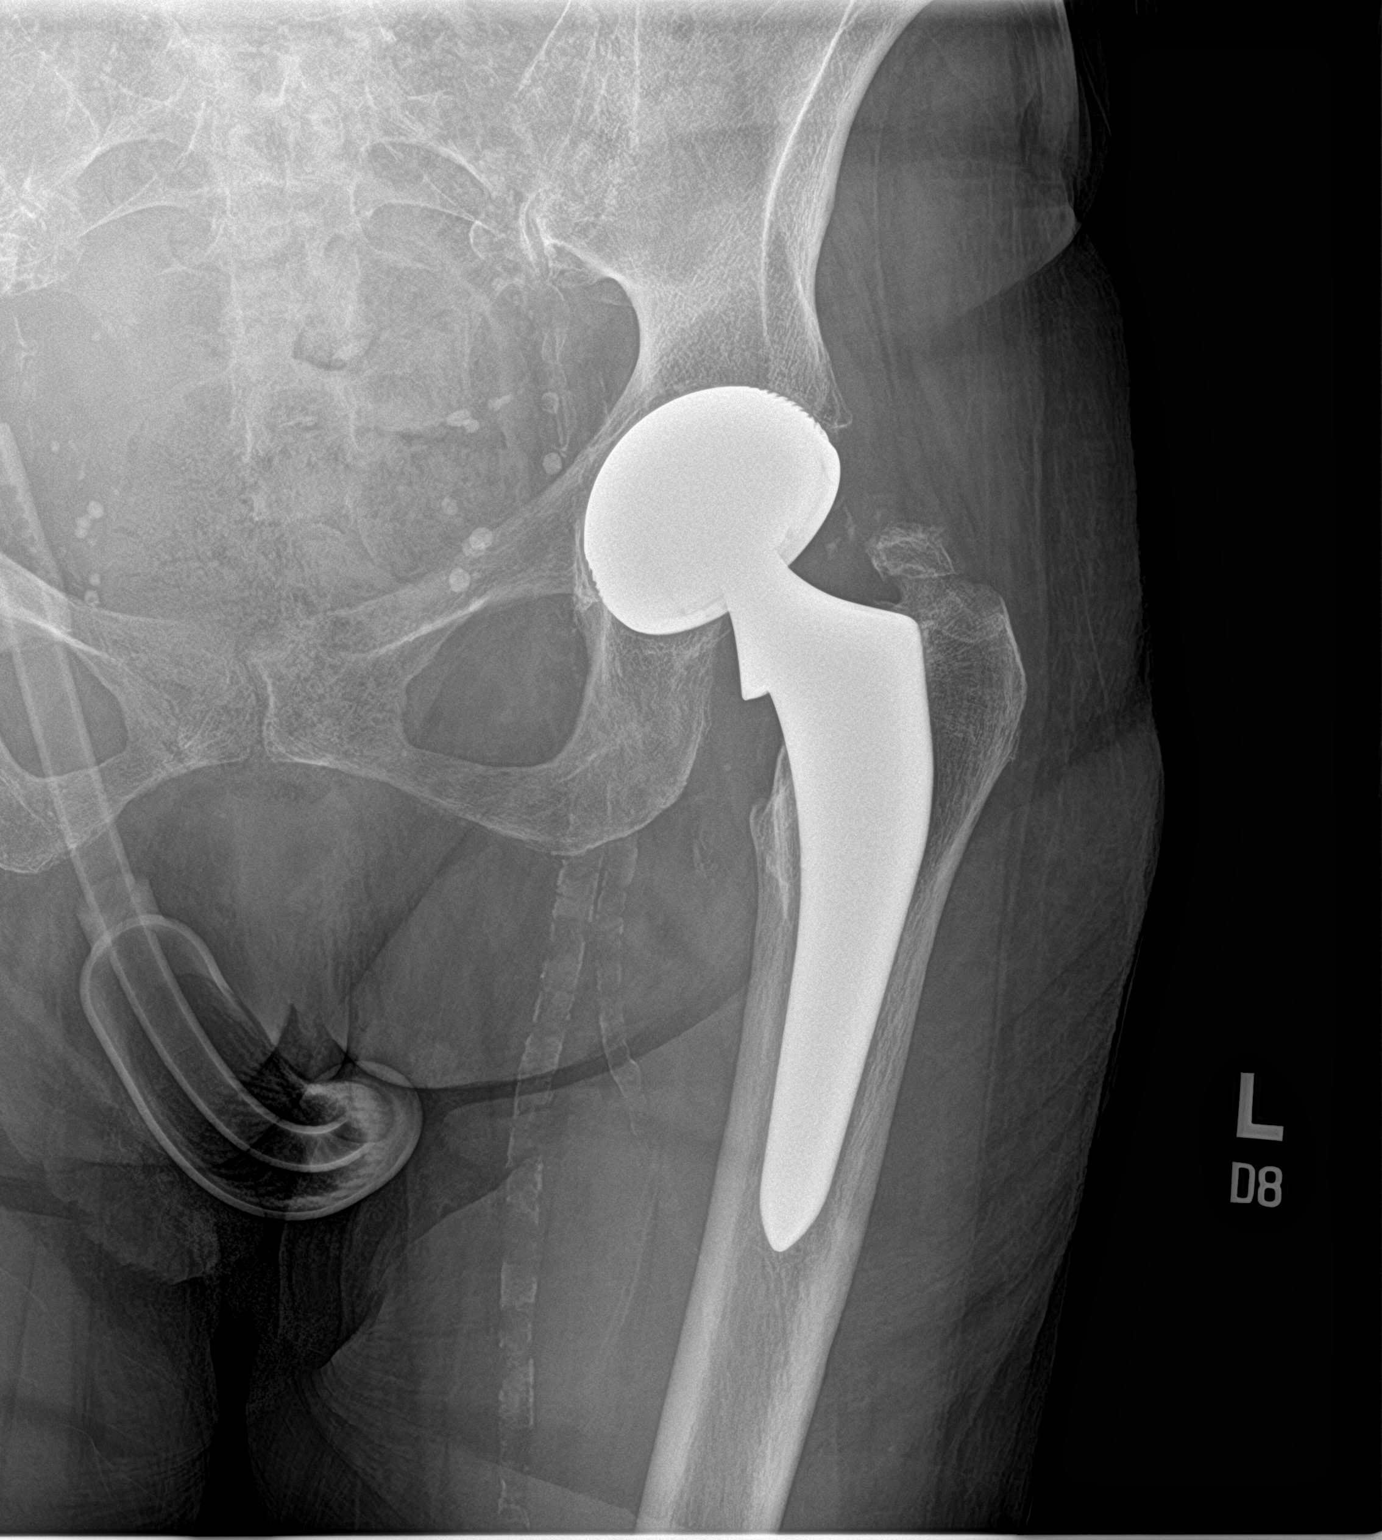
[im 3/3]
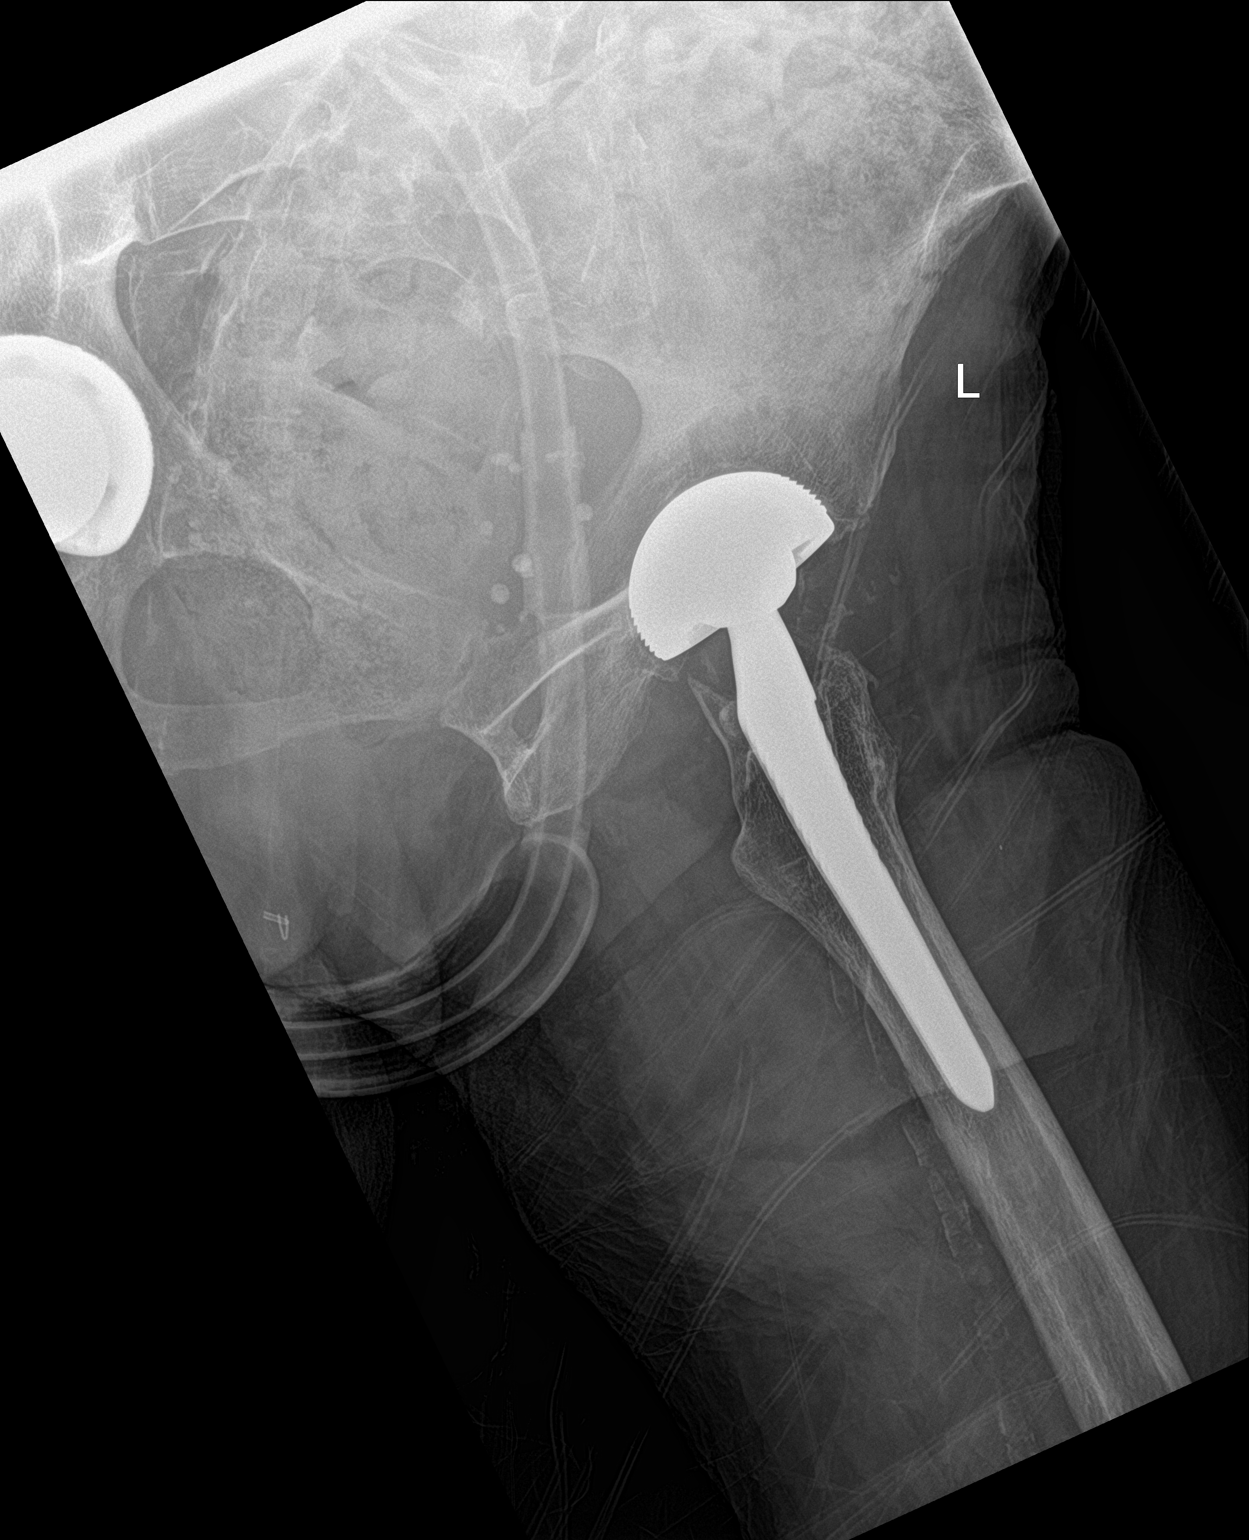

[3 of 3 positions shown; findings below may reference images not displayed]

FINDINGS: Status post bilateral total hip arthroplasties. No fracture or
dislocation is noted. Vascular calcifications are noted.
IMPRESSION: No acute abnormality seen in the left hip.

## 2021-05-30 ENCOUNTER — Telehealth: Payer: Self-pay

## 2021-05-30 NOTE — Telephone Encounter (Signed)
Referral received from Dr. Ouida Sills for ovarian cyst. Called and left voicemail with daughter, [name], as directed for scheduling.

## 2021-05-31 ENCOUNTER — Telehealth: Payer: Self-pay

## 2021-05-31 NOTE — Telephone Encounter (Signed)
Second call placed to daughter, [name]. Spoke with [name] and Ms Tavares is in a facility and is not very mobile. Appointment arranged for 06/08/21. Earlier appointment offered and declined due to advance notice needed for facility to arrange transportation.

## 2021-06-06 ENCOUNTER — Encounter: Payer: Self-pay | Admitting: *Deleted

## 2021-06-08 ENCOUNTER — Other Ambulatory Visit: Payer: Self-pay

## 2021-06-08 ENCOUNTER — Inpatient Hospital Stay: Payer: Medicare Other | Attending: Obstetrics and Gynecology | Admitting: Obstetrics and Gynecology

## 2021-06-08 ENCOUNTER — Encounter: Payer: Self-pay | Admitting: Obstetrics and Gynecology

## 2021-06-08 VITALS — BP 96/44 | HR 60 | Temp 98.7°F | Resp 20

## 2021-06-08 DIAGNOSIS — Z90710 Acquired absence of both cervix and uterus: Secondary | ICD-10-CM | POA: Insufficient documentation

## 2021-06-08 DIAGNOSIS — N3281 Overactive bladder: Secondary | ICD-10-CM | POA: Diagnosis not present

## 2021-06-08 DIAGNOSIS — Z7982 Long term (current) use of aspirin: Secondary | ICD-10-CM | POA: Diagnosis not present

## 2021-06-08 DIAGNOSIS — K219 Gastro-esophageal reflux disease without esophagitis: Secondary | ICD-10-CM | POA: Diagnosis not present

## 2021-06-08 DIAGNOSIS — N83209 Unspecified ovarian cyst, unspecified side: Secondary | ICD-10-CM | POA: Insufficient documentation

## 2021-06-08 DIAGNOSIS — N83201 Unspecified ovarian cyst, right side: Secondary | ICD-10-CM | POA: Diagnosis not present

## 2021-06-08 DIAGNOSIS — Z79621 Long term (current) use of calcineurin inhibitor: Secondary | ICD-10-CM | POA: Diagnosis not present

## 2021-06-08 DIAGNOSIS — Z951 Presence of aortocoronary bypass graft: Secondary | ICD-10-CM | POA: Insufficient documentation

## 2021-06-08 DIAGNOSIS — I25110 Atherosclerotic heart disease of native coronary artery with unstable angina pectoris: Secondary | ICD-10-CM | POA: Diagnosis not present

## 2021-06-08 DIAGNOSIS — M1610 Unilateral primary osteoarthritis, unspecified hip: Secondary | ICD-10-CM | POA: Insufficient documentation

## 2021-06-08 DIAGNOSIS — E782 Mixed hyperlipidemia: Secondary | ICD-10-CM | POA: Diagnosis not present

## 2021-06-08 DIAGNOSIS — I252 Old myocardial infarction: Secondary | ICD-10-CM | POA: Insufficient documentation

## 2021-06-08 DIAGNOSIS — Z955 Presence of coronary angioplasty implant and graft: Secondary | ICD-10-CM | POA: Insufficient documentation

## 2021-06-08 DIAGNOSIS — R102 Pelvic and perineal pain: Secondary | ICD-10-CM | POA: Diagnosis not present

## 2021-06-08 DIAGNOSIS — G9341 Metabolic encephalopathy: Secondary | ICD-10-CM | POA: Diagnosis not present

## 2021-06-08 DIAGNOSIS — I509 Heart failure, unspecified: Secondary | ICD-10-CM | POA: Insufficient documentation

## 2021-06-08 DIAGNOSIS — I130 Hypertensive heart and chronic kidney disease with heart failure and stage 1 through stage 4 chronic kidney disease, or unspecified chronic kidney disease: Secondary | ICD-10-CM | POA: Insufficient documentation

## 2021-06-08 DIAGNOSIS — Z79899 Other long term (current) drug therapy: Secondary | ICD-10-CM | POA: Diagnosis not present

## 2021-06-08 DIAGNOSIS — N183 Chronic kidney disease, stage 3 unspecified: Secondary | ICD-10-CM | POA: Insufficient documentation

## 2021-06-08 DIAGNOSIS — R296 Repeated falls: Secondary | ICD-10-CM | POA: Insufficient documentation

## 2021-06-08 DIAGNOSIS — N9489 Other specified conditions associated with female genital organs and menstrual cycle: Secondary | ICD-10-CM

## 2021-06-08 NOTE — Progress Notes (Addendum)
Gynecologic Oncology Consult Visit   Referring Provider: Dr Leafy Ro  Chief Concern: cystic pelvic mass and pelvic pain.   Subjective:  Monique Burns is a 88 y.o. female who is seen in consultation from Dr. Caryl Comes for pelvic mass.  Patient referred to Dr Leafy Ro from Dr Caryl Comes for increasing right pelvic pain and increasing right ovarian cyst .  CT scan 1/22 Reproductive: Uterus has been surgically removed. Cystic changes are noted in the right ovary. These appear simple in nature. The largest of these measures 3.1 Cm in dimension  6/22 CT ABDOMEN AND PELVIS WITHOUT CONTRAST FINDINGS: Lower chest: Lung bases demonstrate linear atelectasis or scarring in the lower lobes. No pleural effusion. Borderline cardiomegaly.  Hepatobiliary: No focal liver abnormality is seen. No gallstones, gallbladder wall thickening, or biliary dilatation.  Pancreas: Unremarkable. No pancreatic ductal dilatation or surrounding inflammatory changes.  Spleen: Normal in size without focal abnormality.  Adrenals/Urinary Tract: Adrenal glands are normal. Kidneys show no hydronephrosis. Bilateral renal cysts. No hydronephrosis. The bladder is unremarkable.  Stomach/Bowel: The stomach is nonenlarged. No dilated small bowel. No acute bowel wall thickening. Appendix not well seen. Extensive diverticular disease of the colon without acute wall thickening.  Vascular/Lymphatic: Advanced aortic atherosclerosis. No aneurysm. No suspicious nodes.  Reproductive: History of hysterectomy in epic records. Interval enlargement of right adnexal mass, now measuring 4.3 by 3.5 cm, previously 3.1 cm.  Other: Negative for free air or free fluid.  Musculoskeletal: Bilateral hip replacements. No acute or suspicious osseous abnormality.    Pelvic US 03/18/21 COMPARISON:  CT abdomen pelvis 10/26/2020   FINDINGS: Uterus   Surgically absent   Endometrium   Surgically absent   Right ovary   No normal appearing  RIGHT ovary visualized, see below   Left ovary   Not visualized, likely obscured by bowel   Other findings   Complex cystic mass identified within RIGHT adnexa, 6.0 x 2.7 x 3.8 cm, previously 4.3 x 3.5 x 3.2 cm on prior CT. Lesion contains multiple septations, some of which are irregular in appearance. Questionable mural nodule 10 mm diameter. No definite abnormal blood flow within the lesion on color Doppler imaging. No free pelvic fluid. No additional pelvic masses.   IMPRESSION: Surgical absence of uterus with nonvisualization of LEFT ovary.   Complex cystic mass in RIGHT adnexa 6.0 x 2.7 x 3.8 cm, previously 4.3 x 3.5 x 3.2 cm on prior CT.   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  Surgical evaluation recommended.  CA125 03/06/21 = 38.1  She has significant cardiac disease all is followed by Dr Clayborn Bigness. Last evaluation in 11/21 See assessment and plan below.  Daughter has been told that the patient is not a candidate for surgery in view of age and cardiovascular history.  Lives in a nursing home and not ambulatory.   Cerebrovascular accident (CVA), unspecified mechanism (CMS-HCC)  Bradycardia  Essential hypertension  Angina at rest (CMS-HCC)  S/P CABG x 4  Coronary artery disease involving native coronary artery of native heart with unstable angina pectoris (CMS-HCC)  Hyperlipidemia, mixed  CRI (chronic renal insufficiency), stage 2 (mild)  Stage 3 chronic kidney disease, unspecified whether stage 3a or 3b CKD (CMS-HCC)  S/P drug eluting coronary stent placement  Murmur, unspecified   Plan  1 history of coronary bypass surgery for multivessel coronary disease reasonable result still has some anginal symptoms still on medical therapy 2 multivessel coronary disease PCI stent coronary bypass surgery  still has some anginal symptoms continue medical therapy metoprolol amlodipine aspirin Lipitor Ranexa 3 angina  intermittent recurrent with multivessel coronary disease unable to tolerate Imdur continue Ranexa statin aspirin 4 hyperlipidemia continue Lipitor therapy for lipid management 5 chronic renal sufficiency stage III continue current management follow-up with nephrology 6 recurrent falls poor balance recommend consider physical therapy use walker 7 hypertension reasonably controlled continue clonidine amlodipine 8 bradycardia probably related to metoprolol no clear indication for permanent pacemaker continue conservative management   Problem List: Patient Active Problem List   Diagnosis Date Noted   Malnutrition of moderate degree 04/19/2021   Frequent falls 04/18/2021   Brain bleed ([place]) 04/18/2021   Left wrist sprain 04/18/2021   CAD (coronary artery disease) 04/18/2021   GERD (gastroesophageal reflux disease)    Protein-calorie malnutrition, severe 12/30/2020   Fall 12/29/2020   Debility 06/16/2020   Left hip pain    Lobar pneumonia ([place])    Acute metabolic encephalopathy    Falls frequently    Elevated d-dimer    Anemia    CAP (community acquired pneumonia) 05/28/2020   Overactive bladder 04/08/2020   Urge incontinence 04/08/2020   Nocturia 04/08/2020   Unstable angina ([place]) 12/20/2017   Protein malnutrition ([place]) 11/27/2015   Chest pain 11/26/2015   Chronic kidney disease 04/05/2015   HLD (hyperlipidemia) 04/05/2015   Essential hypertension 04/05/2015   Decreased leukocytes 04/05/2015   Arthritis, degenerative 04/05/2015   Disease of thyroid gland 04/05/2015   Acute myocardial infarction of anterior wall ([place]) 02/28/2015   Degenerative arthritis of hip 10/31/2013    Past Medical History: Past Medical History:  Diagnosis Date   Anemia    Arthritis    Blood dyscrasia    since being put on Brilinta after stent placement developing hematoma   Cancer ([place])    basal cell left leg   CHF (congestive heart failure) (HCC)    GERD (gastroesophageal reflux disease)     Headache    h/o migraines   Hyperlipidemia    Hypertension    Leukopenia    MI (myocardial infarction) ([place]) 02/2015   s/p 4 stents and CABG in 2017   Ovarian cyst    Right   Renal disorder    stage 3-Dr Caryl Comes keeping a check on kidney function   Shortness of breath dyspnea    mild compared to prior stenting-pt states she would have to stop and rest if walking a mile   Thyroid disease    Past Surgical History: Past Surgical History:  Procedure Laterality Date   ABDOMINAL HYSTERECTOMY     CARDIAC CATHETERIZATION N/A 12/08/2015   Procedure: Left Heart Cath and Coronary Angiography;  Surgeon: Yolonda Kida, MD;  Location: [facility];  Service: Cardiovascular;  Laterality: N/A;   CORONARY ANGIOPLASTY WITH STENT PLACEMENT     CORONARY STENT PLACEMENT  2016   x2   EYE SURGERY Bilateral 2011   INCISION AND DRAINAGE Left 08/26/2015   Procedure: INCISION AND DRAINAGE / HAND HEMATOMA;  Surgeon: Hessie Knows, MD;  Location: [facility];  Service: Orthopedics;  Laterality: Left;   JOINT REPLACEMENT Bilateral    left 2016 and right 2015   TONSILLECTOMY     3rd grade      Family History: Family History  Problem Relation Age of Onset   Breast cancer Paternal 54    Brain cancer Mother    Stroke Father    Brain cancer Son     Social History: Social History  Socioeconomic History   Marital status: Married    Spouse name: Not on file   Number of children: Not on file   Years of education: Not on file   Highest education level: Not on file  Occupational History   Not on file  Tobacco Use   Smoking status: Never   Smokeless tobacco: Never  Vaping Use   Vaping Use: Never used  Substance and Sexual Activity   Alcohol use: No   Drug use: No   Sexual activity: Not Currently  Other Topics Concern   Not on file  Social History Narrative   Lives at home with husband   Independent at baseline   Social Determinants of Health   Financial Resource Strain: Not  on file  Food Insecurity: Not on file  Transportation Needs: Not on file  Physical Activity: Not on file  Stress: Not on file  Social Connections: Not on file  Intimate Partner Violence: Not on file    Allergies: Allergies  Allergen Reactions   Brilinta [Ticagrelor] Other (See Comments)    "caused stent to fail"   Imdur [Isosorbide Nitrate] Other (See Comments)    Headache    Current Medications: Current Outpatient Medications  Medication Sig Dispense Refill   acetaminophen (TYLENOL) 500 MG tablet Take 1,000 mg by mouth in the morning and at bedtime.     acetaminophen (TYLENOL) 500 MG tablet Take 500 mg by mouth every 6 (six) hours as needed for mild pain or moderate pain.     aspirin EC 81 MG tablet Take 81 mg by mouth daily.     atorvastatin (LIPITOR) 40 MG tablet Take 40 mg by mouth at bedtime.     cephALEXin (KEFLEX) 500 MG capsule Take 1 capsule (500 mg total) by mouth 2 (two) times daily. 14 capsule 0   cycloSPORINE (RESTASIS) 0.05 % ophthalmic emulsion Place 1 drop into both eyes 2 (two) times daily.      DULoxetine (CYMBALTA) 30 MG capsule Take 30 mg by mouth daily.  2   guaiFENesin (ROBITUSSIN) 100 MG/5ML liquid Take 200 mg by mouth every 6 (six) hours as needed for cough.     lamoTRIgine (LAMICTAL XR) 50 MG 24 hour tablet Take 50 mg by mouth daily.     lidocaine (LIDODERM) 5 % Place 1 patch onto the skin daily. Remove & Discard patch within 12 hours or as directed by MD 30 patch 0   metoprolol succinate (TOPROL-XL) 25 MG 24 hr tablet Take 25 mg by mouth daily.     Multiple Vitamin (MULTIVITAMIN ADULT PO) Take 1 tablet by mouth daily.     nitroGLYCERIN (NITROSTAT) 0.4 MG SL tablet Place 1 tablet (0.4 mg total) under the tongue once as needed for up to 1 dose for chest pain. 100 tablet 3   ondansetron (ZOFRAN) 4 MG tablet Take 4 mg by mouth every 6 (six) hours as needed for nausea or vomiting.     polyethylene glycol (MIRALAX / GLYCOLAX) 17 g packet Take 17 g by mouth  daily.     polyvinyl alcohol (LIQUIFILM TEARS) 1.4 % ophthalmic solution Place 1 drop into both eyes 2 (two) times daily.     polyvinyl alcohol (LIQUIFILM TEARS) 1.4 % ophthalmic solution Place 1 drop into both eyes every 12 (twelve) hours as needed for dry eyes.     ranolazine (RANEXA) 1000 MG SR tablet Take 1,000 mg by mouth 2 (two) times daily.  11   tolterodine (DETROL LA) 2 MG 24 hr  capsule Take 2 mg by mouth daily.     No current facility-administered medications for this visit.    Review of Systems General: negative for, fevers, chills, changes in sleep Skin: negative for changes in color, texture, moles or lesions Eyes: negative for, changes in vision, pain, diplopia HEENT: negative for, change in hearing, discharge, tinnitus, vertigo, voice changes, sore throat, neck masses Pulmonary: negative for, productive cough Cardiac: negative for, palpitations, syncope, discomfort, pressure Gastrointestinal: negative for, dysphagia, nausea, vomiting, jaundice, pain, constipation, diarrhea, hematemesis, hematochezia Musculoskeletal: negative for, swelling Hematology: negative for, easy bruising, bleeding Neurologic/Psych: some headaches, seizures weakness numbness  Objective:  Physical Examination:  BP (!) 96/44    Pulse 60    Temp 98.7 F (37.1 C)    Resp 20    SpO2 100%    ECOG Performance Status: 3 - Symptomatic, >50% confined to bed  General appearance: cooperative and appears stated age HEENT:PERRLA and thyroid without masses Lymph node survey: non-palpable, axillary, inguinal, supraclavicular Cardiovascular: regular rate and rhythm Respiratory: normal air entry, lungs clear to auscultation Abdomen: tender in RLQ, no masses Back: inspection of back is normal Extremities: extremities normal, atraumatic, no cyanosis or edema Skin exam - normal coloration and turgor, no rashes, no suspicious skin lesions noted. Neurological exam reveals: somewhat confused and in a wheel  chair  Pelvic: exam chaperoned by nurse;  Vulva: normal appearing vulva with no masses, tenderness or lesions; Vagina: normal vagina; Adnexa: cystic mass in mid right abdomen and is tender on palpation; Cervix: normal; Rectal: confirms  Lab Review Lab Results  Component Value Date   WBC 5.6 05/11/2021   HGB 10.5 (L) 05/11/2021   HCT 33.1 (L) 05/11/2021   MCV 102.2 (H) 05/11/2021   PLT 295 05/11/2021     Chemistry      Component Value Date/Time   NA 133 (L) 05/11/2021 1228   NA 136 03/25/2014 0407   K 4.8 05/11/2021 1228   K 3.2 (L) 03/25/2014 0407   CL 103 05/11/2021 1228   CL 102 03/25/2014 0407   CO2 22 05/11/2021 1228   CO2 28 03/25/2014 0407   BUN 31 (H) 05/11/2021 1228   BUN 20 (H) 03/25/2014 0407   CREATININE 1.64 (H) 05/11/2021 1228   CREATININE 1.19 03/25/2014 0407      Component Value Date/Time   CALCIUM 9.3 05/11/2021 1228   CALCIUM 7.8 (L) 03/25/2014 0407   ALKPHOS 101 05/11/2021 1228   AST 24 05/11/2021 1228   ALT 13 05/11/2021 1228   BILITOT 0.9 05/11/2021 1228    Albumin 4.2 04/18/21    Assessment:  Monique Burns is a 88 y.o. female diagnosed with enlarging cystic right pelvic mass after complaining of pelvic pain a year ago.  Mass is now 6 x 3.8 cm, muti-cystic with mural nodule that has been enlarging over the past 8 months.  Overall not concerning for high grade ovarian cancer.  CA125 = 38. Could be cystadenoma or borderline tumor.   Poor surgical candidate.   Prior hysterectomy.  Medical co-morbidities complicating care:  Cerebrovascular accident (CVA), unspecified mechanism (CMS-HCC)  Bradycardia  Essential hypertension  Angina at rest (CMS-HCC)  S/P CABG x 4  Coronary artery disease involving native coronary artery of native heart with unstable angina pectoris (CMS-HCC)  CRI (chronic renal insufficiency), stage 2 (mild)  Stage 3 chronic kidney disease, unspecified whether stage 3a or 3b CKD (CMS-HCC)  S/P drug eluting coronary stent placement   Plan:   Problem List Items Addressed  This Visit       Endocrine   Ovarian cystic mass   Other Visit Diagnoses     Adnexal mass    -  Primary   Relevant Orders   CT GUIDED NEEDLE PLACEMENT      We discussed options for management with the patient and daughter.  Typically the treatment for a painful pelvic mass would be laparoscopic surgical excision, but she is elderly and has significant cardiovascular disease and poor performance status.  She lives in a nursing home.  Her cardiologist Dr Clayborn Bigness said she was not a candidate for any type of surgery.  In view of this, we believe that radiology guided drainage would be the best approach for improving the pelvic pain, which is 4 out of 10. Discussed that we do not typically drain cystic ovarian masses because of concern about spilling a cancer, but in view of her discomfort and being a very elderly poor surgical candidate, think this is a reasonable approach and the patient and her daughter are in strong agreement and not interested in surgery.    Will check with Dr Clayborn Bigness to confirm that she is not a surgical candidate. Suggested return to clinic after surgical drainage.    The patient's diagnosis, an outline of the further diagnostic and laboratory studies which will be required, the recommendation, and alternatives were discussed.  All questions were answered to the patient's satisfaction.  Mellody Drown, MD  CC:  Adin Hector, MD [facility] [place],  [place] [address] [phone]

## 2021-06-13 ENCOUNTER — Other Ambulatory Visit: Payer: Self-pay | Admitting: Internal Medicine

## 2021-06-13 ENCOUNTER — Telehealth: Payer: Self-pay | Admitting: Nurse Practitioner

## 2021-06-13 DIAGNOSIS — N83201 Unspecified ovarian cyst, right side: Secondary | ICD-10-CM

## 2021-06-13 NOTE — Telephone Encounter (Signed)
Spoke to patient's daughter. Reviewed with patient option of having patient seen at [facility] clinic for possible surgery at [facility]. Per Dr Fransisca Connors patient has received clearance from cardiology. Patient's daughter expresses concern for transportation and general frailty in addition to poor cognition and impaired memory. She declines surgery on behalf of patient. She would like to proceed with CT guided drainage and pathology. I reviewed this with Dr. Fransisca Connors as well.

## 2021-06-15 ENCOUNTER — Telehealth: Payer: Self-pay

## 2021-06-15 NOTE — Telephone Encounter (Signed)
Called and spoke with daughter, [name], regarding cyst drainage. It has been arranged for 07/05/21 at 1030 with an arrival time of 0930. She was also instructed to notify the facility to hold food/liquids after midnight as she will receive some sedation during the procedure. All questions answered.

## 2021-06-22 ENCOUNTER — Ambulatory Visit: Payer: Medicare Other

## 2021-06-22 DIAGNOSIS — Z789 Other specified health status: Secondary | ICD-10-CM | POA: Insufficient documentation

## 2021-07-04 ENCOUNTER — Other Ambulatory Visit: Payer: Self-pay | Admitting: Radiology

## 2021-07-05 ENCOUNTER — Other Ambulatory Visit: Payer: Self-pay | Admitting: Radiology

## 2021-07-05 ENCOUNTER — Ambulatory Visit: Admission: RE | Admit: 2021-07-05 | Payer: Medicare Other | Source: Ambulatory Visit

## 2021-07-06 ENCOUNTER — Ambulatory Visit
Admission: RE | Admit: 2021-07-06 | Discharge: 2021-07-06 | Disposition: A | Discharge status: Home / Self Care | Payer: Medicare Other | Source: Ambulatory Visit | Attending: Nurse Practitioner | Admitting: Nurse Practitioner

## 2021-07-06 ENCOUNTER — Other Ambulatory Visit: Payer: Self-pay

## 2021-07-06 DIAGNOSIS — N83201 Unspecified ovarian cyst, right side: Secondary | ICD-10-CM | POA: Diagnosis not present

## 2021-07-06 DIAGNOSIS — N9489 Other specified conditions associated with female genital organs and menstrual cycle: Secondary | ICD-10-CM | POA: Insufficient documentation

## 2021-07-06 HISTORY — DX: Unspecified convulsions: R56.9

## 2021-07-06 HISTORY — DX: Overactive bladder: N32.81

## 2021-07-06 LAB — CBC
HCT: 38.9 % (ref 36.0–46.0)
Hemoglobin: 12.3 g/dL (ref 12.0–15.0)
MCH: 31.9 pg (ref 26.0–34.0)
MCHC: 31.6 g/dL (ref 30.0–36.0)
MCV: 100.8 fL — ABNORMAL HIGH (ref 80.0–100.0)
Platelets: 301 10*3/uL (ref 150–400)
RBC: 3.86 MIL/uL — ABNORMAL LOW (ref 3.87–5.11)
RDW: 13.7 % (ref 11.5–15.5)
WBC: 5.3 10*3/uL (ref 4.0–10.5)
nRBC: 0 % (ref 0.0–0.2)

## 2021-07-06 LAB — PROTIME-INR
INR: 0.9 (ref 0.8–1.2)
Prothrombin Time: 12.2 seconds (ref 11.4–15.2)

## 2021-07-06 MED ORDER — HYDRALAZINE HCL 20 MG/ML IJ SOLN
10.0000 mg | Freq: Once | INTRAMUSCULAR | Status: DC | PRN
  Filled 2021-07-06: qty 0.5

## 2021-07-06 MED ORDER — FENTANYL CITRATE (PF) 100 MCG/2ML IJ SOLN
INTRAMUSCULAR | Status: AC
  Filled 2021-07-06: qty 2

## 2021-07-06 MED ORDER — HYDRALAZINE HCL 20 MG/ML IJ SOLN
10.0000 mg | Freq: Once | INTRAMUSCULAR | Status: AC
  Filled 2021-07-06: qty 0.5

## 2021-07-06 MED ORDER — MIDAZOLAM HCL 2 MG/2ML IJ SOLN
INTRAMUSCULAR | Status: AC
  Filled 2021-07-06: qty 2

## 2021-07-06 MED ORDER — HYDRALAZINE HCL 20 MG/ML IJ SOLN
INTRAMUSCULAR | Status: AC
  Administered 2021-07-06: 10 mg via INTRAVENOUS
  Filled 2021-07-06: qty 1

## 2021-07-06 MED ORDER — SODIUM CHLORIDE 0.9 % IV SOLN: INTRAVENOUS | Status: DC

## 2021-07-06 NOTE — Progress Notes (Signed)
Dr. Laurence Ferrari at bedside speaking with pt and pt's daughter about procedure  ?

## 2021-07-06 NOTE — H&P (Signed)
Chief Complaint: Patient was seen in consultation today for symptomatic increasing in size right adnexal cyst at the request of [place]  Referring Physician(s): Naranjito G  Supervising Physician: Jacqulynn Cadet  Patient Status: [place] - Out-pt  History of Present Illness: Monique Burns is a 88 y.o. female with PMHx significant for a right adnexal cyst that is symptomatic causing severe pain rated 2/10 but also can be extremely severe and 10/10 since June and not controlled with Tylenol and patient is unable to have narcotics per patient's daughter after they have met with pain management and hospice. She has been seen by OB/GYN with full work up including imaging and labs and is here today for the cyst aspiration for cytology and pain relief purposes. The patient does admit to current abdominal pain, she denies any current chest pain or shortness of breath. She denies any current blood thinner use, denies any known bleeding or clotting disorder. She has no known complications to sedation.    Past Medical History:  Diagnosis Date   Anemia    Arthritis    Blood dyscrasia    since being put on Brilinta after stent placement developing hematoma   Cancer ([place])    basal cell left leg   CHF (congestive heart failure) (HCC)    GERD (gastroesophageal reflux disease)    Headache    h/o migraines   Hyperlipidemia    Hypertension    Leukopenia    Local convulsion (HCC)    MI (myocardial infarction) ([place]) 02/2015   s/p 4 stents and CABG in 2017   Ovarian cyst    Right   Overactive bladder    Renal disorder    stage 3-Dr Caryl Comes keeping a check on kidney function   Shortness of breath dyspnea    mild compared to prior stenting-pt states she would have to stop and rest if walking a mile   Thyroid disease     Past Surgical History:  Procedure Laterality Date   ABDOMINAL HYSTERECTOMY     CARDIAC CATHETERIZATION N/A 12/08/2015   Procedure: Left Heart Cath and Coronary  Angiography;  Surgeon: Yolonda Kida, MD;  Location: [facility];  Service: Cardiovascular;  Laterality: N/A;   CORONARY ANGIOPLASTY WITH STENT PLACEMENT     CORONARY STENT PLACEMENT  2016   x2   EYE SURGERY Bilateral 2011   INCISION AND DRAINAGE Left 08/26/2015   Procedure: INCISION AND DRAINAGE / HAND HEMATOMA;  Surgeon: Hessie Knows, MD;  Location: [facility];  Service: Orthopedics;  Laterality: Left;   JOINT REPLACEMENT Bilateral    left 2016 and right 2015   TONSILLECTOMY     3rd grade    Allergies: Brilinta [ticagrelor] and Imdur [isosorbide nitrate]  Medications: Prior to Admission medications   Medication Sig Start Date End Date Taking? Authorizing Provider  acetaminophen (TYLENOL) 500 MG tablet Take 1,000 mg by mouth in the morning and at bedtime.   Yes [provider]  aspirin EC 81 MG tablet Take 81 mg by mouth daily.   Yes [provider]  cycloSPORINE (RESTASIS) 0.05 % ophthalmic emulsion Place 1 drop into both eyes 2 (two) times daily.    Yes [provider]  DULoxetine (CYMBALTA) 30 MG capsule Take 30 mg by mouth daily.   Yes [provider]  fluticasone (FLONASE) 50 MCG/ACT nasal spray Place 1 spray into both nostrils daily.   Yes [provider]  lamoTRIgine (LAMICTAL XR) 50 MG 24  hour tablet Take 50 mg by mouth daily.   Yes [provider]  lidocaine (LIDODERM) 5 % Place 1 patch onto the skin daily. Remove & Discard patch within 12 hours or as directed by MD 06/01/20  Yes Nolberto Hanlon, MD  metoprolol succinate (TOPROL-XL) 25 MG 24 hr tablet Take 25 mg by mouth daily. 05/03/20  Yes [provider]  mirtazapine (REMERON) 7.5 MG tablet Take 7.5 mg by mouth at bedtime. 05/29/21  Yes [provider]  Multiple Vitamin (MULTIVITAMIN ADULT PO) Take 1 tablet by mouth daily.   Yes [provider]  polyethylene glycol (MIRALAX / GLYCOLAX) 17 g packet Take 17 g by mouth daily.   Yes [provider]  polyvinyl alcohol (LIQUIFILM TEARS) 1.4 % ophthalmic solution Place 1 drop into both eyes 2 (two) times daily.   Yes [provider]  polyvinyl alcohol (LIQUIFILM TEARS) 1.4 % ophthalmic solution Place 1 drop into both eyes every 12 (twelve) hours as needed for dry eyes.   Yes [provider]  ranolazine (RANEXA) 1000 MG SR tablet Take 1,000 mg by mouth 2 (two) times daily. 11/30/17  Yes [provider]  tolterodine (DETROL LA) 2 MG 24 hr capsule Take 2 mg by mouth daily.   Yes [provider]  acetaminophen (TYLENOL) 500 MG tablet Take 500 mg by mouth every 6 (six) hours as needed for mild pain or moderate pain.    [provider]  atorvastatin (LIPITOR) 40 MG tablet Take 40 mg by mouth at bedtime.    [provider]  cephALEXin (KEFLEX) 500 MG capsule Take 1 capsule (500 mg total) by mouth 2 (two) times daily. 05/12/21   Ward, Delice Bison, DO  guaiFENesin (ROBITUSSIN) 100 MG/5ML liquid Take 200 mg by mouth every 6 (six) hours as needed for cough.    [provider]  nitroGLYCERIN (NITROSTAT) 0.4 MG SL tablet Place 1 tablet (0.4 mg total) under the tongue once as needed for up to 1 dose for chest pain. 06/20/20   Rudene Re, MD  [facility]ans[facility]) 4 MG tablet Take 4 mg by mouth every 6 (six) hours as needed for nausea or vomiting. 08/05/20   [provider]     Family History  Problem Relation Age of Onset   Breast cancer Paternal Grandmother    Brain cancer Mother    Stroke Father    Brain cancer Son     Social History   Socioeconomic History   Marital status: Married    Spouse name: Not on file   Number of children: Not on file   Years of education: Not on file   Highest education level: Not on file  Occupational History   Not on file  Tobacco Use   Smoking status: Never   Smokeless tobacco: Never  Vaping Use   Vaping Use: Never used  Substance and Sexual Activity   Alcohol use: No    Drug use: No   Sexual activity: Not Currently  Other Topics Concern   Not on file  Social History Narrative   Lives at home with husband   Independent at baseline   Social Determinants of Health   Financial Resource Strain: Not on file  Food Insecurity: Not on file  Transportation Needs: Not on file  Physical Activity: Not on file  Stress: Not on file  Social Connections: Not on file   Review of Systems: A 12 point ROS discussed and pertinent positives are indicated in the HPI  above.  All other systems are negative.  Review of Systems  Vital Signs: BP (!) 204/69 Comment: PA Avaleigh Decuir made aware of BP   Temp 97.6 F (36.4 C) (Oral)    Resp 12    Ht 5' 7" (1.702 m)    Wt 102 lb (46.3 kg)    SpO2 99%    BMI 15.98 kg/m   Physical Exam Constitutional:      Appearance: Normal appearance.  HENT:     Head: Normocephalic and atraumatic.  Cardiovascular:     Rate and Rhythm: Normal rate and regular rhythm.  Pulmonary:     Effort: Pulmonary effort is normal.     Breath sounds: Normal breath sounds.  Abdominal:     General: There is distension.     Tenderness: There is abdominal tenderness.     Comments: RLQ and inferior to umbilical TTP  Neurological:     Mental Status: She is alert and oriented to person, place, and time.   Imaging: No results found.  Labs:  CBC: Recent Labs    04/19/21 0207 04/20/21 0228 05/11/21 1228 07/06/21 1051  WBC 4.6 7.0 5.6 5.3  HGB 9.4* 9.9* 10.5* 12.3  HCT 28.9* 29.0* 33.1* 38.9  PLT 206 218 295 301    COAGS: Recent Labs    08/25/20 1159 07/06/21 1051  INR 1.0 0.9    BMP: Recent Labs    04/18/21 0758 04/19/21 0207 04/20/21 0228 05/11/21 1228  NA 135 135 133* 133*  K 4.0 3.7 4.1 4.8  CL 105 107 104 103  CO2 _0 GLUCOSE 114* 97 100* 109*  BUN 37* 35* 30* 31*  CALCIUM 9.5 9.0 9.3 9.3  CREATININE 1.42* 1.19* 1.31* 1.64*  GFRNONAA 36* 44* 39* 30*    LIVER FUNCTION TESTS: Recent Labs    08/25/20 1159  12/29/20 1926 04/18/21 0758 05/11/21 1228  BILITOT 1.0 0.8 0.9 0.9  AST 33 40 29 24  ALT _1 ALKPHOS 60 61 122 101  PROT 6.6 7.8 7.4 7.1  ALBUMIN 3.7 4.6 4.2 3.7    Assessment and Plan: This is a 88 year female with PMHx of chronically painful worsening and increasing size of right adnexal cyst. The patient has been seen by OB/GYN with full work-up and CA-125 without high concern for malignancy however still unknown, last imaging done 03/2021. The patient is not a good surgical candidate or narcotic candidate given her multiple co-morbidities and age. Per the patient and her daughter who is POA they wish to proceed with right adnexal cyst aspiration for cytology purposes and also pain relief, they have been seen by OB/GYN, pain management and hospice care prior to today's procedure.   The patient has been NPO, no blood thinners taken, imaging, labs and vitals have been reviewed.  Risks and benefits of image guided right adnexal cyst aspiration with moderate sedation was discussed with the patient and/or patient's family/ HCPOA (daughter who is bedside) including, but not limited to inability to have safe percutaneous window for aspiration, bleeding, infection, damage to adjacent structures, recurrence of cyst, or low yield in pathology requiring additional tests. The patient and her POA who is her daughter are very educated on the risks of this procedure which includes seeding the abdominal/pelvic peritoneum with cells from the cyst and if this cyst is malignant the malignant cells would spread throughout the abdomen and the patient may have rapid decline. They verbalize understanding of these risks and wish  to proceed today. Dr. Laurence Ferrari who will be performing the procedure also discussed the risks with the patient and her daughter today.  All of the questions were answered and there is agreement to proceed.  Consent signed and in chart.  Thank you for this interesting consult.  I  greatly enjoyed meeting Monique Burns and look forward to participating in their care.  A copy of this report was sent to the requesting provider on this date.  Electronically Signed: Hedy Jacob, PA-C 07/06/2021, 11:32 AM   I spent a total of 15 Minutes in face to face in clinical consultation, greater than 50% of which was counseling/coordinating care for right adnexal cyst

## 2021-07-06 NOTE — Procedures (Signed)
Interventional Radiology Procedure Note ? ?Procedure: CT guided aspiration of right complex ovarian cyst yielding 15 mL serous fluid ? ?Complications: None ? ?Estimated Blood Loss: None ? ?Recommendations: ?- DC Home ? ? ?Signed, ? ?Criselda Peaches, MD ? ? ?

## 2021-07-08 LAB — CYTOLOGY - NON PAP

## 2021-07-21 ENCOUNTER — Telehealth: Payer: Self-pay

## 2021-07-21 NOTE — Telephone Encounter (Signed)
Pathology from cyst drainage was reviewed with Dr. Fransisca Connors. We will see her back in clinic for a follow up. Called and reviewed pathology with [name] and follow up appointment made. ?

## 2021-08-31 ENCOUNTER — Inpatient Hospital Stay: Payer: Medicare Other

## 2021-09-07 ENCOUNTER — Inpatient Hospital Stay: Payer: Medicare Other | Attending: Obstetrics and Gynecology | Admitting: Obstetrics and Gynecology

## 2021-09-07 VITALS — BP 121/48 | HR 63 | Temp 98.7°F | Resp 20

## 2021-09-07 DIAGNOSIS — Z79621 Long term (current) use of calcineurin inhibitor: Secondary | ICD-10-CM | POA: Diagnosis not present

## 2021-09-07 DIAGNOSIS — R1031 Right lower quadrant pain: Secondary | ICD-10-CM | POA: Diagnosis not present

## 2021-09-07 DIAGNOSIS — Z7982 Long term (current) use of aspirin: Secondary | ICD-10-CM | POA: Diagnosis not present

## 2021-09-07 DIAGNOSIS — R011 Cardiac murmur, unspecified: Secondary | ICD-10-CM | POA: Diagnosis not present

## 2021-09-07 DIAGNOSIS — Z951 Presence of aortocoronary bypass graft: Secondary | ICD-10-CM | POA: Insufficient documentation

## 2021-09-07 DIAGNOSIS — Z90710 Acquired absence of both cervix and uterus: Secondary | ICD-10-CM | POA: Insufficient documentation

## 2021-09-07 DIAGNOSIS — E782 Mixed hyperlipidemia: Secondary | ICD-10-CM | POA: Diagnosis not present

## 2021-09-07 DIAGNOSIS — I252 Old myocardial infarction: Secondary | ICD-10-CM | POA: Diagnosis not present

## 2021-09-07 DIAGNOSIS — I130 Hypertensive heart and chronic kidney disease with heart failure and stage 1 through stage 4 chronic kidney disease, or unspecified chronic kidney disease: Secondary | ICD-10-CM | POA: Insufficient documentation

## 2021-09-07 DIAGNOSIS — Z8673 Personal history of transient ischemic attack (TIA), and cerebral infarction without residual deficits: Secondary | ICD-10-CM | POA: Diagnosis not present

## 2021-09-07 DIAGNOSIS — N83201 Unspecified ovarian cyst, right side: Secondary | ICD-10-CM | POA: Diagnosis present

## 2021-09-07 DIAGNOSIS — I25110 Atherosclerotic heart disease of native coronary artery with unstable angina pectoris: Secondary | ICD-10-CM | POA: Insufficient documentation

## 2021-09-07 DIAGNOSIS — Z79899 Other long term (current) drug therapy: Secondary | ICD-10-CM | POA: Diagnosis not present

## 2021-09-07 DIAGNOSIS — R102 Pelvic and perineal pain: Secondary | ICD-10-CM | POA: Diagnosis not present

## 2021-09-07 DIAGNOSIS — R159 Full incontinence of feces: Secondary | ICD-10-CM | POA: Insufficient documentation

## 2021-09-07 DIAGNOSIS — N183 Chronic kidney disease, stage 3 unspecified: Secondary | ICD-10-CM | POA: Diagnosis not present

## 2021-09-07 DIAGNOSIS — G8929 Other chronic pain: Secondary | ICD-10-CM | POA: Insufficient documentation

## 2021-09-07 DIAGNOSIS — N9489 Other specified conditions associated with female genital organs and menstrual cycle: Secondary | ICD-10-CM | POA: Insufficient documentation

## 2021-09-07 NOTE — Progress Notes (Signed)
Gynecologic Oncology Interval Visit  ? ?Referring Provider: Dr Leafy Ro ? ?Chief Concern: cystic pelvic mass and pelvic pain.  ? ?Subjective:  ?Monique Burns is a 88 y.o. female who is seen in consultation from Dr. Caryl Comes for pelvic mass. ? ?She presents with follow up.  ? ?07/06/2021 ?CT guided drainage - CT-guided aspiration of right ovarian cystic mass yielding 15 mL ?clear yellow serous fluid. ?DIAGNOSIS:  ?A. OVARIAN CYST, RIGHT; CT-GUIDED FINE-NEEDLE ASPIRATION:  ?- ATYPIA OF UNDETERMINED SIGNIFICANCE.  ? ?Comment:  ?Smears display a relatively cellular sampling, comprised of clusters of  ?cells with mild atypia, including increased N: C ratio, and mild nuclear  ?contour irregularities.  No definite overtly malignant cell population  ?is identified.  The findings are atypical, but the significance of these  ?changes is unclear.  There are no features diagnostic of a high-grade  ?neoplasm; however, a low-grade or borderline neoplasm cannot be entirely  ?excluded.  Clinical and radiographic correlation is recommended.  ? ?She has increasing symptoms of abdominal girth, mass size, umbilical nodule, and fecal incontinence (sounds like she has a h/o constipation and perhaps that is medically treated leading to looser stool). She had NSVD x 2 which she describes as difficult but both babies were "small". She also has a h/o urinary incontinence and saw a Dealer. This is managed medically.  ? ?Gynecologic Oncology History  ?Monique Burns is a pleasant patient who is seen in consultation from Dr Leafy Ro (from Dr Caryl Comes) for increasing right pelvic pain and increasing right ovarian cyst .  ? ?CT scan 1/22 ?Reproductive: Uterus has been surgically removed. Cystic changes are ?noted in the right ovary. These appear simple in nature. The largest ?of these measures 3.1 Cm in dimension ? ?6/22 ?CT ABDOMEN AND PELVIS WITHOUT CONTRAST ?FINDINGS: ?Lower chest: Lung bases demonstrate linear atelectasis or scarring ?in the  lower lobes. No pleural effusion. Borderline cardiomegaly. ? ?Hepatobiliary: No focal liver abnormality is seen. No gallstones, ?gallbladder wall thickening, or biliary dilatation. ? ?Pancreas: Unremarkable. No pancreatic ductal dilatation or ?surrounding inflammatory changes. ? ?Spleen: Normal in size without focal abnormality. ? ?Adrenals/Urinary Tract: Adrenal glands are normal. Kidneys show no ?hydronephrosis. Bilateral renal cysts. No hydronephrosis. The ?bladder is unremarkable. ? ?Stomach/Bowel: The stomach is nonenlarged. No dilated small bowel. ?No acute bowel wall thickening. Appendix not well seen. 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 ? ?Vascular/Lymphatic: Advanced aortic atherosclerosis. No aneurysm. No ?suspicious nodes. ? ?Reproductive: History of hysterectomy in epic records. Interval ?enlargement of right adnexal mass, now measuring 4.3 by 3.5 cm, ?previously 3.1 cm. ? ?Other: Negative for free air or free fluid. ? ?Musculoskeletal: Bilateral hip replacements. No acute or suspicious ?osseous abnormality.   ? ?Pelvic US 03/18/21 ?COMPARISON:  CT abdomen pelvis 10/26/2020 ?  ?FINDINGS: ?Uterus ?  ?Surgically absent ?  ?Endometrium ?  ?Surgically absent ?  ?Right ovary ?  ?No normal appearing RIGHT ovary visualized, see below ?  ?Left ovary ?  ?Not visualized, likely obscured by bowel ?  ?Other findings ?  ?Complex cystic mass identified within RIGHT adnexa, 6.0 x 2.7 x 3.8 ?cm, previously 4.3 x 3.5 x 3.2 cm on prior CT. Lesion contains ?multiple septations, some of which are irregular in appearance. ?Questionable mural nodule 10 mm diameter. No definite abnormal blood ?flow within the lesion on color Doppler imaging. No free pelvic ?fluid. No additional pelvic masses. ?  ?IMPRESSION: ?Surgical absence of uterus with nonvisualization of LEFT ovary. ?  ?Complex cystic mass in  RIGHT adnexa 6.0 x 2.7 x 3.8 cm, previously ?4.3 x 3.5 x 3.2 cm on prior CT. ?  ?The  lesion is suspicious for a cystic ovarian neoplasm, though this ?could be benign or malignant.  Surgical evaluation recommended. ? ?CA125 03/06/21 = 38.1 ? ?She has significant cardiac disease all is followed by Dr Clayborn Bigness. Last evaluation in 11/21 ?See assessment and plan below.  Daughter has been told that the patient is not a candidate for surgery in view of age and cardiovascular history.  Lives in a nursing home and not ambulatory.  ? ?Cerebrovascular accident (CVA), unspecified mechanism (CMS-HCC)  ?Bradycardia  ?Essential hypertension  ?Angina at rest (CMS-HCC)  ?S/P CABG x 4  ?Coronary artery disease involving native coronary artery of native heart with unstable angina pectoris (CMS-HCC)  ?Hyperlipidemia, mixed  ?CRI (chronic renal insufficiency), stage 2 (mild)  ?Stage 3 chronic kidney disease, unspecified whether stage 3a or 3b CKD (CMS-HCC)  ?S/P drug eluting coronary stent placement  ?Murmur, unspecified  ? ?Plan  ?1 history of coronary bypass surgery for multivessel coronary disease reasonable result still has some anginal symptoms still on medical therapy ?2 multivessel coronary disease PCI stent coronary bypass surgery still has some anginal symptoms continue medical therapy metoprolol amlodipine aspirin Lipitor Ranexa ?3 angina intermittent recurrent with multivessel coronary disease unable to tolerate Imdur continue Ranexa statin aspirin ?4 hyperlipidemia continue Lipitor therapy for lipid management ?5 chronic renal sufficiency stage III continue current management follow-up with nephrology ?6 recurrent falls poor balance recommend consider physical therapy use walker ?7 hypertension reasonably controlled continue clonidine amlodipine ?8 bradycardia probably related to metoprolol no clear indication for permanent pacemaker continue conservative management ? ?She is not a surgical candidate due to significant cardiovascular disease and poor performance status.  Her cardiologist Dr Clayborn Bigness said she  was not a candidate for any type of surgery. and Dr. Fransisca Connors recommended radiologic guided drainage.  ? ?Problem List: ?Patient Active Problem List  ? Diagnosis Date Noted  ? Adnexal mass 09/07/2021  ? Abdominal pain, chronic, right lower quadrant 09/07/2021  ? Ovarian cystic mass 06/08/2021  ? Malnutrition of moderate degree 04/19/2021  ? Frequent falls 04/18/2021  ? Brain bleed ([place]) 04/18/2021  ? Left wrist sprain 04/18/2021  ? CAD (coronary artery disease) 04/18/2021  ? GERD (gastroesophageal reflux disease)   ? Protein-calorie malnutrition, severe 12/30/2020  ? Fall 12/29/2020  ? Debility 06/16/2020  ? Left hip pain   ? Lobar pneumonia ([place])   ? Acute metabolic encephalopathy   ? Falls frequently   ? Elevated d-dimer   ? Anemia   ? CAP (community acquired pneumonia) 05/28/2020  ? Overactive bladder 04/08/2020  ? Urge incontinence 04/08/2020  ? Nocturia 04/08/2020  ? Unstable angina ([place]) 12/20/2017  ? Protein malnutrition ([place]) 11/27/2015  ? Chest pain 11/26/2015  ? Chronic kidney disease 04/05/2015  ? HLD (hyperlipidemia) 04/05/2015  ? Essential hypertension 04/05/2015  ? Decreased leukocytes 04/05/2015  ? Arthritis, degenerative 04/05/2015  ? Disease of thyroid gland 04/05/2015  ? Acute myocardial infarction of anterior wall ([place]) 02/28/2015  ? Degenerative arthritis of hip 10/31/2013  ? ? ?Past Medical History: ?Past Medical History:  ?Diagnosis Date  ? Anemia   ? Arthritis   ? Blood dyscrasia   ? since being put on Brilinta after stent placement developing hematoma  ? Cancer [facility])   ? basal cell left leg  ? CHF (congestive heart failure) ([place])   ? GERD (gastroesophageal reflux disease)   ? Headache   ?  h/o migraines  ? Hyperlipidemia   ? Hypertension   ? Leukopenia   ? Local convulsion ([place])   ? MI (myocardial infarction) ([place]) 02/2015  ? s/p 4 stents and CABG in 2017  ? Ovarian cyst   ? Right  ? Overactive bladder   ? Renal disorder   ? stage 3-Dr Caryl Comes keeping a check on kidney function  ? Shortness of  breath dyspnea   ? mild compared to prior stenting-pt states she would have to stop and rest if walking a mile  ? Thyroid disease   ? ?Past Surgical History: ?Past Surgical History:  ?Procedure Laterality Date

## 2021-09-07 NOTE — Progress Notes (Signed)
Referral sent to Monique Burns, Dr. Bary Castilla, for fecal incontinence.  ?

## 2021-09-08 ENCOUNTER — Telehealth: Payer: Self-pay | Admitting: *Deleted

## 2021-09-08 NOTE — Telephone Encounter (Signed)
Got a call from radiology in [place] wanting to see if Monique Burns had gotten the results of the CT-guided aspiration on the patient.  Left a message with the #[phone].  Lauren is working from home today so I sent her a secure chat about this information so that she would look at the biopsy report. ?

## 2021-09-15 ENCOUNTER — Ambulatory Visit
Admission: RE | Admit: 2021-09-15 | Discharge: 2021-09-15 | Disposition: A | Discharge status: Home / Self Care | Payer: Medicare Other | Source: Ambulatory Visit | Attending: Obstetrics and Gynecology | Admitting: Obstetrics and Gynecology

## 2021-09-15 ENCOUNTER — Other Ambulatory Visit: Payer: Self-pay

## 2021-09-15 DIAGNOSIS — N9489 Other specified conditions associated with female genital organs and menstrual cycle: Secondary | ICD-10-CM | POA: Diagnosis present

## 2021-09-21 ENCOUNTER — Inpatient Hospital Stay (HOSPITAL_BASED_OUTPATIENT_CLINIC_OR_DEPARTMENT_OTHER): Payer: Medicare Other | Admitting: Obstetrics and Gynecology

## 2021-09-21 ENCOUNTER — Other Ambulatory Visit: Payer: Self-pay

## 2021-09-21 VITALS — BP 126/51 | HR 69 | Temp 98.7°F | Resp 20

## 2021-09-21 DIAGNOSIS — N183 Chronic kidney disease, stage 3 unspecified: Secondary | ICD-10-CM | POA: Insufficient documentation

## 2021-09-21 DIAGNOSIS — N9489 Other specified conditions associated with female genital organs and menstrual cycle: Secondary | ICD-10-CM

## 2021-09-21 DIAGNOSIS — N83201 Unspecified ovarian cyst, right side: Secondary | ICD-10-CM

## 2021-09-21 NOTE — Progress Notes (Signed)
Gynecologic Oncology Interval Visit  ? ?Referring Provider: Dr Leafy Ro ? ?Chief Concern: cystic pelvic mass and pelvic pain.  ? ?Subjective:  ?Monique Burns is a 88 y.o. female who is seen in consultation from Dr. Leafy Ro for right adnexal mass s/p ct guided drainage d/t medical comorbidities/poor surgical candidate, who returns to clinic for discussion of imaging results.  ? ?She underwent CT guided drainage of right ovarian cyst March 2023, which yielded 15 ml of atypical cells, not overtly malignant though low grade or borderline could not be excluded.  ? ?09/15/21- This was followed up with CT A/P WO contrast which revealed right adnexal mass, mildly increased in size since previous scan. Now measures 4.8 x 3.7 cm (May 2023) vs 4.3 x 3.5 (10/2020) vs 3.1 cm (05/2020).  ? ? ? ? ?Gynecologic Oncology History  ?CLYDINE Burns is a pleasant patient who is seen in consultation from Dr Leafy Ro (from Dr Caryl Comes) for increasing right pelvic pain and increasing right ovarian cyst .  ? ?CT scan 1/22 ?Reproductive: Uterus has been surgically removed. Cystic changes are noted in the right ovary. These appear simple in nature. The largest of these measures 3.1 Cm in dimension ? ?6/22 - CT ABDOMEN AND PELVIS WITHOUT CONTRAST ?FINDINGS: ?Lower chest: Lung bases demonstrate linear atelectasis or scarring in the lower lobes. No pleural effusion. Borderline cardiomegaly. ?Hepatobiliary: No focal liver abnormality is seen. No gallstones, gallbladder wall thickening, or biliary dilatation. ?Pancreas: Unremarkable. No pancreatic ductal dilatation or surrounding inflammatory changes. ?Spleen: Normal in size without focal abnormality. ?Adrenals/Urinary Tract: Adrenal glands are normal. Kidneys show no hydronephrosis. Bilateral renal cysts. No hydronephrosis. The bladder is unremarkable. ?Stomach/Bowel: The stomach is nonenlarged. No dilated small bowel. No acute bowel wall thickening. Appendix not well seen. Extensive diverticular  disease of the colon without acute wall thickening. ?Vascular/Lymphatic: Advanced aortic atherosclerosis. No aneurysm. No suspicious nodes. ?Reproductive: History of hysterectomy in epic records. Interval enlargement of right adnexal mass, now measuring 4.3 by 3.5 cm, previously 3.1 cm. ?Other: Negative for free air or free fluid. ?Musculoskeletal: Bilateral hip replacements. No acute or suspicious osseous abnormality.   ? ?Pelvic US 03/18/21 ?COMPARISON:  CT abdomen pelvis 10/26/2020 ?  ?FINDINGS: ?Uterus - Surgically absent ?Endometrium- Surgically absent ?Right ovary- No normal appearing RIGHT ovary visualized, see below ?Left ovary- Not visualized, likely obscured by bowel ?Other findings- Complex cystic mass identified within RIGHT adnexa, 6.0 x 2.7 x 3.8 cm, previously 4.3 x 3.5 x 3.2 cm on prior CT. Lesion contains multiple septations, some of which are irregular in appearance. ?Questionable mural nodule 10 mm diameter. No definite abnormal blood flow within the lesion on color Doppler imaging. No free pelvic fluid. No additional pelvic masses. ?  ?IMPRESSION: ?- Surgical absence of uterus with nonvisualization of LEFT ovary. ?- Complex cystic mass in RIGHT adnexa 6.0 x 2.7 x 3.8 cm, previously 4.3 x 3.5 x 3.2 cm on prior CT. 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  Surgical evaluation recommended. ? ?CA125 03/06/21 = 38.1 ? ?She has significant cardiac disease all is followed by Dr Monique Burns. Last evaluation in 11/21 ?See assessment and plan below.  Daughter has been told that the patient is not a candidate for surgery in view of age and cardiovascular history.  Lives in a nursing home and not ambulatory.  ? ?Cerebrovascular accident (CVA), unspecified mechanism (CMS-HCC)  ?Bradycardia  ?Essential hypertension  ?Angina at rest (CMS-HCC)  ?S/P CABG x 4  ?  Coronary artery disease involving native coronary artery of native heart with unstable angina  pectoris (CMS-HCC)  ?Hyperlipidemia, mixed  ?CRI (chronic renal insufficiency), stage 2 (mild)  ?Stage 3 chronic kidney disease, unspecified whether stage 3a or 3b CKD (CMS-HCC)  ?S/P drug eluting coronary stent placement  ?Murmur, unspecified  ? ?Plan  ?1 history of coronary bypass surgery for multivessel coronary disease reasonable result still has some anginal symptoms still on medical therapy ?2 multivessel coronary disease PCI stent coronary bypass surgery still has some anginal symptoms continue medical therapy metoprolol amlodipine aspirin Lipitor Ranexa ?3 angina intermittent recurrent with multivessel coronary disease unable to tolerate Imdur continue Ranexa statin aspirin ?4 hyperlipidemia continue Lipitor therapy for lipid management ?5 chronic renal sufficiency stage III continue current management follow-up with nephrology ?6 recurrent falls poor balance recommend consider physical therapy use walker ?7 hypertension reasonably controlled continue clonidine amlodipine ?8 bradycardia probably related to metoprolol no clear indication for permanent pacemaker continue conservative management ? ?She is not a surgical candidate due to significant cardiovascular disease and poor performance status.  Her cardiologist Dr Monique Burns said she was not a candidate for any type of surgery. and Dr. Fransisca Burns recommended radiologic guided drainage.  ? ?07/06/2021 ?CT guided drainage - CT-guided aspiration of right ovarian cystic mass yielding 15 mL clear yellow serous fluid. ?DIAGNOSIS:  ?A. OVARIAN CYST, RIGHT; CT-GUIDED FINE-NEEDLE ASPIRATION:  ?- ATYPIA OF UNDETERMINED SIGNIFICANCE.  ? ?Comment:  ?Smears display a relatively cellular sampling, comprised of clusters of cells with mild atypia, including increased N: C ratio, and mild nuclear contour irregularities.  No definite overtly malignant cell population  ?is identified.  The findings are atypical, but the significance of these changes is unclear.  There are no  features diagnostic of a high-grade neoplasm; however, a low-grade or borderline neoplasm cannot be entirely excluded.  Clinical and radiographic correlation is recommended.  ? ?She has increasing symptoms of abdominal girth, mass size, umbilical nodule, and fecal incontinence (sounds like she has a h/o constipation and perhaps that is medically treated leading to looser stool). She had NSVD x 2 which she describes as difficult but both babies were "small". She also has a h/o urinary incontinence and saw a Dealer. This is managed medically.  ? ?Problem List: ?Patient Active Problem List  ? Diagnosis Date Noted  ? Stage 3 chronic kidney disease ([place]) 09/21/2021  ? Adnexal mass 09/07/2021  ? Abdominal pain, chronic, right lower quadrant 09/07/2021  ? Resides in skilled nursing facility 06/22/2021  ? Ovarian cystic mass 06/08/2021  ? Chronic pain of left wrist 05/17/2021  ? Dementia arising in the senium and presenium ([place]) 04/26/2021  ? Malnutrition of moderate degree 04/19/2021  ? Frequent falls 04/18/2021  ? Brain bleed ([place]) 04/18/2021  ? Left wrist sprain 04/18/2021  ? CAD (coronary artery disease) 04/18/2021  ? GERD (gastroesophageal reflux disease)   ? Protein-calorie malnutrition, severe 12/30/2020  ? Fall 12/29/2020  ? Debility 06/16/2020  ? Left hip pain   ? Lobar pneumonia ([place])   ? Acute metabolic encephalopathy   ? Falls frequently   ? Elevated d-dimer   ? Anemia   ? CAP (community acquired pneumonia) 05/28/2020  ? Overactive bladder 04/08/2020  ? Urge incontinence 04/08/2020  ? Nocturia 04/08/2020  ? B12 deficiency 01/01/2019  ? Intractable chronic migraine without aura and without status migrainosus 07/23/2018  ? Unstable angina ([place]) 12/20/2017  ? Senile purpura ([place]) 05/14/2017  ? Cerebral amyloid angiopathy ([place]) 04/17/2017  ? Stable angina ([place]) 05/02/2016  ?  Postoperative atrial fibrillation ([place]) 01/24/2016  ? S/P CABG x 4 12/28/2015  ? Thrombocytopenia ([place]) 12/28/2015  ? Protein malnutrition  ([place]) 11/27/2015  ? Chest pain 11/26/2015  ? Aortic atherosclerosis ([place]) 11/07/2015  ? Chronic kidney disease 04/05/2015  ? HLD (hyperlipidemia) 04/05/2015  ? Essential hypertension 04/05/2015  ? Decreased leukoc

## 2021-09-23 ENCOUNTER — Telehealth: Payer: Self-pay

## 2021-09-23 ENCOUNTER — Other Ambulatory Visit: Payer: Self-pay | Admitting: Nurse Practitioner

## 2021-09-23 DIAGNOSIS — N9489 Other specified conditions associated with female genital organs and menstrual cycle: Secondary | ICD-10-CM

## 2021-09-23 NOTE — Telephone Encounter (Signed)
Called and spoke with daughter, [name] regarding biopsy. Biopsy scheduled for 09/28/21. Per [name], [name] has decided she does not want to have the biopsy. States she has voiced that she would not have any type of treatment if it is malignant. She will talk to her further and she will call if she changes her mind. She is requesting biopsy to be cancelled. Biopsy cancelled.

## 2021-09-27 ENCOUNTER — Other Ambulatory Visit: Payer: Self-pay | Admitting: Radiology

## 2021-09-27 DIAGNOSIS — N9489 Other specified conditions associated with female genital organs and menstrual cycle: Secondary | ICD-10-CM

## 2021-09-28 ENCOUNTER — Ambulatory Visit
Admission: RE | Admit: 2021-09-28 | Discharge: 2021-09-28 | Disposition: A | Discharge status: Home / Self Care | Payer: Medicare Other | Source: Ambulatory Visit | Attending: Nurse Practitioner | Admitting: Nurse Practitioner

## 2021-09-28 DIAGNOSIS — N9489 Other specified conditions associated with female genital organs and menstrual cycle: Secondary | ICD-10-CM

## 2021-09-28 MED ORDER — SODIUM CHLORIDE 0.9 % IV SOLN: INTRAVENOUS | Status: DC

## 2022-04-11 ENCOUNTER — Emergency Department: Payer: Medicare Other

## 2022-04-11 ENCOUNTER — Emergency Department
Admission: EM | Admit: 2022-04-11 | Discharge: 2022-04-11 | Disposition: A | Discharge status: Skilled Nursing Facility | Payer: Medicare Other | Attending: Emergency Medicine | Admitting: Emergency Medicine

## 2022-04-11 DIAGNOSIS — N183 Chronic kidney disease, stage 3 unspecified: Secondary | ICD-10-CM | POA: Diagnosis not present

## 2022-04-11 DIAGNOSIS — Z79899 Other long term (current) drug therapy: Secondary | ICD-10-CM | POA: Diagnosis not present

## 2022-04-11 DIAGNOSIS — Z85828 Personal history of other malignant neoplasm of skin: Secondary | ICD-10-CM | POA: Diagnosis not present

## 2022-04-11 DIAGNOSIS — Z96653 Presence of artificial knee joint, bilateral: Secondary | ICD-10-CM | POA: Insufficient documentation

## 2022-04-11 DIAGNOSIS — F0390 Unspecified dementia without behavioral disturbance: Secondary | ICD-10-CM | POA: Diagnosis not present

## 2022-04-11 DIAGNOSIS — W19XXXA Unspecified fall, initial encounter: Secondary | ICD-10-CM

## 2022-04-11 DIAGNOSIS — Z7982 Long term (current) use of aspirin: Secondary | ICD-10-CM | POA: Diagnosis not present

## 2022-04-11 DIAGNOSIS — Y92002 Bathroom of unspecified non-institutional (private) residence single-family (private) house as the place of occurrence of the external cause: Secondary | ICD-10-CM | POA: Insufficient documentation

## 2022-04-11 DIAGNOSIS — S0990XA Unspecified injury of head, initial encounter: Secondary | ICD-10-CM

## 2022-04-11 DIAGNOSIS — I2510 Atherosclerotic heart disease of native coronary artery without angina pectoris: Secondary | ICD-10-CM | POA: Insufficient documentation

## 2022-04-11 DIAGNOSIS — I130 Hypertensive heart and chronic kidney disease with heart failure and stage 1 through stage 4 chronic kidney disease, or unspecified chronic kidney disease: Secondary | ICD-10-CM | POA: Insufficient documentation

## 2022-04-11 DIAGNOSIS — I509 Heart failure, unspecified: Secondary | ICD-10-CM | POA: Diagnosis not present

## 2022-04-11 DIAGNOSIS — W182XXA Fall in (into) shower or empty bathtub, initial encounter: Secondary | ICD-10-CM | POA: Diagnosis not present

## 2022-04-11 DIAGNOSIS — Z951 Presence of aortocoronary bypass graft: Secondary | ICD-10-CM | POA: Insufficient documentation

## 2022-04-11 DIAGNOSIS — S0101XA Laceration without foreign body of scalp, initial encounter: Secondary | ICD-10-CM | POA: Insufficient documentation

## 2022-04-11 LAB — URINALYSIS, ROUTINE W REFLEX MICROSCOPIC
Bilirubin Urine: NEGATIVE
Glucose, UA: NEGATIVE mg/dL
Hgb urine dipstick: NEGATIVE
Ketones, ur: NEGATIVE mg/dL
Leukocytes,Ua: NEGATIVE
Nitrite: NEGATIVE
Protein, ur: NEGATIVE mg/dL
Specific Gravity, Urine: 1.016 (ref 1.005–1.030)
pH: 5 (ref 5.0–8.0)

## 2022-04-11 MED ORDER — LIDOCAINE-EPINEPHRINE 2 %-1:100000 IJ SOLN
30.0000 mL | Freq: Once | INTRAMUSCULAR | Status: DC
  Filled 2022-04-11: qty 2

## 2022-04-11 MED ORDER — LIDOCAINE-EPINEPHRINE 2 %-1:100000 IJ SOLN
20.0000 mL | Freq: Once | INTRAMUSCULAR | Status: AC
  Administered 2022-04-11: 20 mL

## 2022-04-11 MED ORDER — ACETAMINOPHEN 325 MG PO TABS
650.0000 mg | ORAL_TABLET | Freq: Once | ORAL | Status: AC
  Administered 2022-04-11: 650 mg via ORAL
  Filled 2022-04-11: qty 2

## 2022-04-11 NOTE — ED Provider Notes (Signed)
[facility] Provider Note    Event Date/Time   First MD Initiated Contact with Patient 04/11/22 0130     (approximate)   History   Fall (From SNF, Staff states that she stood to go to the bathroom and fell. Struck her head resulting in laceration to forehead. Patient is confused/dementia at baseline)   HPI  Level V caveat: Limited by dementia  Monique Burns is a 88 y.o. female brought to the ED via EMS from SNF with a complaint of unwitnessed fall.  Per [name] patient only takes daily baby aspirin.  Denies LOC.  Complains of mild head pain.  Denies vision changes, neck pain, chest pain, shortness of breath, abdominal pain, nausea, vomiting or dizziness.  Reports tetanus is up-to-date.   Past Medical History   Past Medical History:  Diagnosis Date   Anemia    Arthritis    Blood dyscrasia    since being put on Brilinta after stent placement developing hematoma   Cancer ([place])    basal cell left leg   CHF (congestive heart failure) (HCC)    GERD (gastroesophageal reflux disease)    Headache    h/o migraines   Hyperlipidemia    Hypertension    Leukopenia    Local convulsion (HCC)    MI (myocardial infarction) ([place]) 02/2015   s/p 4 stents and CABG in 2017   Ovarian cyst    Right   Overactive bladder    Renal disorder    stage 3-Dr Caryl Comes keeping a check on kidney function   Shortness of breath dyspnea    mild compared to prior stenting-pt states she would have to stop and rest if walking a mile   Thyroid disease      Active Problem List   Patient Active Problem List   Diagnosis Date Noted   Stage 3 chronic kidney disease ([place]) 09/21/2021   Adnexal mass 09/07/2021   Abdominal pain, chronic, right lower quadrant 09/07/2021   Resides in skilled nursing facility 06/22/2021   Ovarian cystic mass 06/08/2021   Chronic pain of left wrist 05/17/2021   Dementia arising in the senium and presenium ([place]) 04/26/2021   Malnutrition of moderate degree  04/19/2021   Frequent falls 04/18/2021   Brain bleed ([place]) 04/18/2021   Left wrist sprain 04/18/2021   CAD (coronary artery disease) 04/18/2021   GERD (gastroesophageal reflux disease)    Protein-calorie malnutrition, severe 12/30/2020   Fall 12/29/2020   Debility 06/16/2020   Left hip pain    Lobar pneumonia ([place])    Acute metabolic encephalopathy    Falls frequently    Elevated d-dimer    Anemia    CAP (community acquired pneumonia) 05/28/2020   Overactive bladder 04/08/2020   Urge incontinence 04/08/2020   Nocturia 04/08/2020   B12 deficiency 01/01/2019   Intractable chronic migraine without aura and without status migrainosus 07/23/2018   Unstable angina (HCC) 12/20/2017   Senile purpura ([place]) 05/14/2017   Cerebral amyloid angiopathy ([place]) 04/17/2017   Stable angina 05/02/2016   Postoperative atrial fibrillation ([place]) 01/24/2016   S/P CABG x 4 12/28/2015   Thrombocytopenia ([place]) 12/28/2015   Protein malnutrition ([place]) 11/27/2015   Chest pain 11/26/2015   Aortic atherosclerosis ([place]) 11/07/2015   Chronic kidney disease 04/05/2015   HLD (hyperlipidemia) 04/05/2015   Essential hypertension 04/05/2015   Decreased leukocytes 04/05/2015   Arthritis, degenerative 04/05/2015   Disease of thyroid gland 04/05/2015   Acute myocardial infarction of anterior wall ([place]) 02/28/2015  Degenerative arthritis of hip 10/31/2013     Past Surgical History   Past Surgical History:  Procedure Laterality Date   ABDOMINAL HYSTERECTOMY     CARDIAC CATHETERIZATION N/A 12/08/2015   Procedure: Left Heart Cath and Coronary Angiography;  Surgeon: Yolonda Kida, MD;  Location: [facility];  Service: Cardiovascular;  Laterality: N/A;   CORONARY ANGIOPLASTY WITH STENT PLACEMENT     CORONARY STENT PLACEMENT  2016   x2   EYE SURGERY Bilateral 2011   INCISION AND DRAINAGE Left 08/26/2015   Procedure: INCISION AND DRAINAGE / HAND HEMATOMA;  Surgeon: Hessie Knows, MD;  Location: [facility]  ORS;  Service: Orthopedics;  Laterality: Left;   JOINT REPLACEMENT Bilateral    left 2016 and right 2015   TONSILLECTOMY     3rd grade     Home Medications   Prior to Admission medications   Medication Sig Start Date End Date Taking? Authorizing Provider  acetaminophen (TYLENOL) 500 MG tablet Take 1,000 mg by mouth in the morning and at bedtime.    [provider]  acetaminophen (TYLENOL) 500 MG tablet Take 500 mg by mouth every 6 (six) hours as needed for mild pain or moderate pain.    [provider]  aspirin EC 81 MG tablet Take 81 mg by mouth daily.    [provider]  atorvastatin (LIPITOR) 40 MG tablet Take 40 mg by mouth at bedtime.    [provider]  cephALEXin (KEFLEX) 500 MG capsule Take 1 capsule (500 mg total) by mouth 2 (two) times daily. 05/12/21   Ward, Delice Bison, DO  cycloSPORINE (RESTASIS) 0.05 % ophthalmic emulsion Place 1 drop into both eyes 2 (two) times daily.     [provider]  DULoxetine (CYMBALTA) 30 MG capsule Take 30 mg by mouth daily.    [provider]  fluticasone (FLONASE) 50 MCG/ACT nasal spray Place 1 spray into both nostrils daily.    [provider]  guaiFENesin (ROBITUSSIN) 100 MG/5ML liquid Take 200 mg by mouth every 6 (six) hours as needed for cough.    [provider]  lamoTRIgine (LAMICTAL XR) 50 MG 24 hour tablet Take 50 mg by mouth daily.    [provider]  lidocaine (LIDODERM) 5 % Place 1 patch onto the skin daily. Remove & Discard patch within 12 hours or as directed by MD 06/01/20   Nolberto Hanlon, MD  metoprolol succinate (TOPROL-XL) 25 MG 24 hr tablet Take 25 mg by mouth daily. 05/03/20   [provider]  mirtazapine (REMERON) 7.5 MG tablet Take 7.5 mg by mouth at bedtime. 05/29/21   [provider]  Multiple Vitamin (MULTIVITAMIN ADULT PO) Take 1 tablet by mouth daily.    [provider]  nitroGLYCERIN (NITROSTAT) 0.4 MG SL tablet Place 1  tablet (0.4 mg total) under the tongue once as needed for up to 1 dose for chest pain. 06/20/20   Rudene Re, MD  ondansetron [facility]) 4 MG tablet Take 4 mg by mouth every 6 (six) hours as needed for nausea or vomiting. 08/05/20   [provider]  polyethylene glycol (MIRALAX / GLYCOLAX) 17 g packet Take 17 g by mouth daily.    [provider]  polyvinyl alcohol (LIQUIFILM TEARS) 1.4 % ophthalmic solution Place 1 drop into both eyes 2 (two) times daily.    [provider]  polyvinyl alcohol (LIQUIFILM TEARS) 1.4 % ophthalmic solution Place 1 drop into both eyes every 12 (twelve) hours as needed  for dry eyes.    [provider]  ranolazine (RANEXA) 1000 MG SR tablet Take 1,000 mg by mouth 2 (two) times daily. 11/30/17   [provider]  SYSTANE PRESERVATIVE FREE 0.4-0.3 % SOLN Apply 1 drop to eye 2 (two) times daily. 07/26/21   [provider]  tolterodine (DETROL LA) 2 MG 24 hr capsule Take 2 mg by mouth daily.    [provider]     Allergies  Brilinta [ticagrelor] and Imdur [isosorbide nitrate]   Family History   Family History  Problem Relation Age of Onset   Breast cancer Paternal Grandmother    Brain cancer Mother    Stroke Father    Brain cancer Son      Physical Exam  Triage Vital Signs: ED Triage Vitals  Enc Vitals Group     BP      Pulse      Resp      Temp      Temp src      SpO2      Weight      Height      Head Circumference      Peak Flow      Pain Score      Pain Loc      Pain Edu?      Excl. in [place]?     Updated Vital Signs: BP (!) 146/54   Pulse 90   Temp 98.2 F (36.8 C) (Oral)   Resp 15   SpO2 94%    General: Awake, no distress.  CV:  RRR.  Good peripheral perfusion.  Resp:  Normal effort.  CTA B. Abd:  Nontender.  No distention.  Other:  Approximately 3 cm vertically linear laceration frontal scalp with venous bleeding controlled with direct pressure.  PERRL.  EOMI.  Nose is  atraumatic.  No dental malocclusion.  Cervical spine nontender to palpation.  Pelvis is stable.   ED Results / Procedures / Treatments  Labs (all labs ordered are listed, but only abnormal results are displayed) Labs Reviewed  URINALYSIS, ROUTINE W REFLEX MICROSCOPIC - Abnormal; Notable for the following components:      Result Value   Color, Urine YELLOW (*)    APPearance CLEAR (*)    All other components within normal limits     EKG  ED ECG REPORT I, Eusebia Grulke J, the attending physician, personally viewed and interpreted this ECG.   Date: 04/11/2022  EKG Time: 0227  Rate: 91  Rhythm: normal sinus rhythm  Axis: LAD  Intervals:none  ST&T Change: Nonspecific    RADIOLOGY I have independently visualized and interpreted patient's CT scan as well as noted the radiology interpretation:  CT head: No ICH  Official radiology report(s): CT Head Wo Contrast  Result Date: 04/11/2022 CLINICAL DATA:  Head trauma, minor (Age >= 65y).  Fall. EXAM: CT HEAD WITHOUT CONTRAST TECHNIQUE: Contiguous axial images were obtained from the base of the skull through the vertex without intravenous contrast. RADIATION DOSE REDUCTION: This exam was performed according to the departmental dose-optimization program which includes automated exposure control, adjustment of the mA and/or kV according to patient size and/or use of iterative reconstruction technique. COMPARISON:  05/11/2021 FINDINGS: Brain: There is atrophy and chronic small vessel disease changes. Again seen is encephalomalacia within the anterior right temporal lobe. No acute intracranial abnormality. Specifically, no hemorrhage, hydrocephalus, mass lesion, acute infarction, or significant intracranial injury. Vascular: No hyperdense vessel or unexpected calcification. Skull: No acute calvarial abnormality. Sinuses/Orbits:  No acute findings Other: None IMPRESSION: Atrophy, chronic microvascular disease. No acute intracranial abnormality. Stable  encephalomalacia in the anterior right temporal lobe. Electronically Signed   By: Rolm Baptise M.D.   On: 04/11/2022 02:14     PROCEDURES:  Critical Care performed: No  ..Laceration Repair  Date/Time: 04/11/2022 1:51 AM  Performed by: Paulette Blanch, MD Authorized by: Paulette Blanch, MD   Consent:    Consent obtained:  Verbal   Consent given by:  Patient   Risks, benefits, and alternatives were discussed: yes     Risks discussed:  Infection, pain, need for additional repair, poor cosmetic result, nerve damage and poor wound healing Anesthesia:    Anesthesia method:  Local infiltration   Local anesthetic:  Lidocaine 1% WITH epi Laceration details:    Location:  Scalp   Scalp location:  Frontal   Length (cm):  3   Depth (mm):  3 Pre-procedure details:    Preparation:  Patient was prepped and draped in usual sterile fashion Treatment:    Area cleansed with:  Povidone-iodine   Amount of cleaning:  Standard   Irrigation solution:  Sterile saline   Irrigation method:  Tap Skin repair:    Repair method:  Sutures   Suture size:  4-0   Suture material:  Nylon   Suture technique:  Running   Number of sutures:  4 Approximation:    Approximation:  Close Repair type:    Repair type:  Simple Post-procedure details:    Dressing:  Open (no dressing)   Procedure completion:  Tolerated well, no immediate complications    MEDICATIONS ORDERED IN ED: Medications  acetaminophen (TYLENOL) tablet 650 mg (650 mg Oral Given 04/11/22 0149)  lidocaine-EPINEPHrine (XYLOCAINE W/EPI) 2 %-1:100000 (with pres) injection 20 mL (20 mLs Infiltration Given by Other 04/11/22 0141)     IMPRESSION / MDM / [place] / ED COURSE  I reviewed the triage vital signs and the nursing notes.                             88 year old female presenting with unwitnessed fall with scalp laceration.  Differential diagnosis includes but is not limited to [place], SDH, scalp laceration, etc.  I have personally  reviewed patient's records and note an oncology office visit on 09/21/2021 for adnexal mass.  Patient's presentation is most consistent with acute complicated illness / injury requiring diagnostic workup.  We will obtain CT head, EKG and UA.  Laceration repaired immediately at bedside upon patient's arrival.  Will reassess.  Clinical Course as of 04/11/22 0429  Tue Apr 11, 2022  0240 CT head is negative for ICH.  Awaiting urine sample. [JS]  5102 UA is negative.  Will discharge back to nursing facility.  Strict return precautions given.  Patient verbalizes understanding and agrees with plan of care. [JS]    Clinical Course User Index [JS] Paulette Blanch, MD     FINAL CLINICAL IMPRESSION(S) / ED DIAGNOSES   Final diagnoses:  Fall, initial encounter  Minor head injury, initial encounter  Scalp laceration, initial encounter     Rx / DC Orders   ED Discharge Orders     None        Note:  This document was prepared using Dragon voice recognition software and may include unintentional dictation errors.   Paulette Blanch, MD 04/11/22 [phone]

## 2022-04-11 NOTE — ED Notes (Signed)
Patient transported to CT 

## 2022-04-11 NOTE — ED Notes (Signed)
Called [facility] for transport back to [facility]

## 2022-04-11 NOTE — Discharge Instructions (Addendum)
Suture removal in 7 to 10 days.  Return to the ER for worsening symptoms, persistent vomiting, lethargy or other concerns.

## 2022-09-06 DEATH — deceased
